# Patient Record
Sex: Female | Born: 1970 | Race: White | Hispanic: No | Marital: Married | Smoking: Former smoker
Health system: Southern US, Community
[De-identification: ages and names within clinical notes are randomized; demographics above are authoritative.]

## PROBLEM LIST (undated history)

## (undated) DIAGNOSIS — C801 Malignant (primary) neoplasm, unspecified: Secondary | ICD-10-CM

## (undated) DIAGNOSIS — Z923 Personal history of irradiation: Secondary | ICD-10-CM

## (undated) DIAGNOSIS — Z9221 Personal history of antineoplastic chemotherapy: Secondary | ICD-10-CM

## (undated) HISTORY — PX: PORTACATH PLACEMENT: SHX2246

## (undated) HISTORY — PX: WISDOM TOOTH EXTRACTION: SHX21

---
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
## 1998-08-25 HISTORY — PX: BUNIONECTOMY: SHX129

## 1999-09-26 ENCOUNTER — Ambulatory Visit (HOSPITAL_BASED_OUTPATIENT_CLINIC_OR_DEPARTMENT_OTHER): Admission: RE | Admit: 1999-09-26 | Discharge: 1999-09-26 | Payer: Self-pay | Admitting: Internal Medicine

## 2017-02-15 ENCOUNTER — Encounter (HOSPITAL_COMMUNITY): Payer: Self-pay | Admitting: Emergency Medicine

## 2017-02-15 ENCOUNTER — Inpatient Hospital Stay (HOSPITAL_COMMUNITY)
Admission: EM | Admit: 2017-02-15 | Discharge: 2017-02-17 | DRG: 494 | Disposition: A | Discharge status: Home / Self Care | Payer: 59 | Attending: Orthopaedic Surgery | Admitting: Orthopaedic Surgery

## 2017-02-15 ENCOUNTER — Emergency Department (HOSPITAL_COMMUNITY): Payer: Self-pay

## 2017-02-15 DIAGNOSIS — S82872A Displaced pilon fracture of left tibia, initial encounter for closed fracture: Principal | ICD-10-CM | POA: Diagnosis present

## 2017-02-15 DIAGNOSIS — Z419 Encounter for procedure for purposes other than remedying health state, unspecified: Secondary | ICD-10-CM

## 2017-02-15 DIAGNOSIS — T1490XA Injury, unspecified, initial encounter: Secondary | ICD-10-CM

## 2017-02-15 DIAGNOSIS — S82452A Displaced comminuted fracture of shaft of left fibula, initial encounter for closed fracture: Secondary | ICD-10-CM | POA: Diagnosis present

## 2017-02-15 DIAGNOSIS — F17210 Nicotine dependence, cigarettes, uncomplicated: Secondary | ICD-10-CM | POA: Diagnosis present

## 2017-02-15 DIAGNOSIS — S82402A Unspecified fracture of shaft of left fibula, initial encounter for closed fracture: Secondary | ICD-10-CM

## 2017-02-15 DIAGNOSIS — S82202A Unspecified fracture of shaft of left tibia, initial encounter for closed fracture: Secondary | ICD-10-CM

## 2017-02-15 DIAGNOSIS — Y9352 Activity, horseback riding: Secondary | ICD-10-CM

## 2017-02-15 MED ORDER — TETANUS-DIPHTH-ACELL PERTUSSIS 5-2.5-18.5 LF-MCG/0.5 IM SUSP: 0.5000 mL | Freq: Once | INTRAMUSCULAR | Status: DC

## 2017-02-15 MED ORDER — FENTANYL CITRATE (PF) 100 MCG/2ML IJ SOLN
50.0000 ug | Freq: Once | INTRAMUSCULAR | Status: AC
  Administered 2017-02-15: 50 ug via INTRAVENOUS
  Filled 2017-02-15: qty 2

## 2017-02-15 MED ORDER — CEFAZOLIN SODIUM-DEXTROSE 1-4 GM/50ML-% IV SOLN
1.0000 g | Freq: Once | INTRAVENOUS | Status: AC
  Administered 2017-02-16: 1 g via INTRAVENOUS
  Filled 2017-02-15: qty 50

## 2017-02-15 MED ORDER — HYDROMORPHONE HCL 1 MG/ML IJ SOLN
1.0000 mg | Freq: Once | INTRAMUSCULAR | Status: AC
  Administered 2017-02-16: 1 mg via INTRAVENOUS
  Filled 2017-02-15: qty 1

## 2017-02-15 NOTE — ED Provider Notes (Signed)
Patient seen/examined in the Emergency Department in conjunction with Midlevel Provider [facility] Patient reports left ankle pain s/p fall from horse.  No other injury reported Exam : awake/alert, distal pulses intact, left ankle deformity noted.  Small abrasions noted medially.   No focal neck tenderness No chest or abdominal tenderness Plan: consult ortho, will likely need admit for complex ankle fracture     Ripley Fraise, MD 02/15/17 2340

## 2017-02-15 NOTE — ED Triage Notes (Signed)
Pt fell off horse and landed on her L ankle, obvious deformity on L ankle. CMS intact, pt able to move toes. Pulses marked.

## 2017-02-15 NOTE — ED Notes (Signed)
Delay in lab draw,  Pt  Not in room

## 2017-02-15 NOTE — ED Provider Notes (Signed)
[place] DEPT Provider Note   CSN: [id number] Arrival date & time: 02/15/17  2222     History   Chief Complaint Chief Complaint  Patient presents with  . Ankle Pain    HPI Angel Hale is a 46 y.o. female.  Patient presents to the emergency department with chief complaint of left ankle injury. She states that she was riding her horse, which is not completely broken, and began bucking and through her to the ground. She landed on her left ankle while twisting it. She complains of severe pain in her left ankle. She denies any numbness or weakness. She has received fentanyl by EMS. She is unable to ambulate. She denies any other injuries. She did not hit her head or pass out. She reports allergy to penicillin, but nothing else. She denies any medical problems.   The history is provided by the patient. No language interpreter was used.    History reviewed. No pertinent past medical history.  There are no active problems to display for this patient.   History reviewed. No pertinent surgical history.  OB History    No data available       Home Medications    Prior to Admission medications   Not on File    Family History No family history on file.  Social History Social History  Substance Use Topics  . Smoking status: Current Every Day Smoker    Packs/day: 1.00    Types: Cigarettes  . Smokeless tobacco: Never Used  . Alcohol use Yes     Allergies   Patient has no known allergies.   Review of Systems Review of Systems  All other systems reviewed and are negative.    Physical Exam Updated Vital Signs BP (!) 149/34 (BP Location: Right Arm)   Pulse 65   Temp 98.3 F (36.8 C) (Oral)   Resp 16   Ht 5\' 5"  (1.651 m)   Wt 49.9 kg (110 lb)   LMP 01/18/2017 (Within Days)   SpO2 98%   BMI 18.30 kg/m   Physical Exam Nursing note and vitals reviewed.  Constitutional: Pt appears well-developed and well-nourished. No distress.  HENT:  Head:  Normocephalic and atraumatic.  Eyes: Conjunctivae are normal.  Neck: Normal range of motion.  Cardiovascular: Normal rate, regular rhythm. Intact distal pulses.   Capillary refill < 3 sec.  Pulmonary/Chest: Effort normal and breath sounds normal.  Musculoskeletal:  LLE Pt exhibits tenderness to palpation diffusely about the left ankle with moderate swelling and a contusion on the medial aspect.   ROM: Able to wiggle toes, but not move ankle without significant pain  Strength: deferred  Neurological: Pt  is alert. Coordination normal.  Sensation: 5/5 Skin: Skin is warm and dry. Pt is not diaphoretic.  Minor abrasion to medial ankle, doesn't appear consistent with puncture Psychiatric: Pt has a normal mood and affect.     ED Treatments / Results  Labs (all labs ordered are listed, but only abnormal results are displayed) Labs Reviewed - No data to display  EKG  EKG Interpretation None       Radiology Dg Ankle 2 Views Left  Result Date: 02/15/2017 CLINICAL DATA:  [place] off of a horse and injured the left ankle EXAM: LEFT ANKLE - 2 VIEW COMPARISON:  None. FINDINGS: Comminuted fracture involving the distal diaphysis of the fibula. There is moderate lateral and posterior angulation of the distal fracture fragment. Superiorly displaced 9 mm fracture fragment at the fibular fracture site with approximately  15 mm of overriding. Severely comminuted intra-articular fracture of the distal tibia with distraction of fracture fragments and lateral and posterior angulation of the distal fracture fragments. Widened and disrupted superolateral mortise. IMPRESSION: 1. Comminuted, angulated and overriding distal fibular fracture 2. Severely comminuted, angulated intra-articular fracture of the distal tibia. Electronically Signed   By: Donavan Foil M.D.   On: 02/15/2017 22:48    Procedures Procedures (including critical care time)  Medications Ordered in ED Medications  fentaNYL (SUBLIMAZE)  injection 50 mcg (50 mcg Intravenous Given 02/15/17 2303)     Initial Impression / Assessment and Plan / ED Course  I have reviewed the triage vital signs and the nursing notes.  Pertinent labs & imaging results that were available during my care of the patient were reviewed by me and considered in my medical decision making (see chart for details).     Patient with significant ankle fracture.  Doesn't appear to be open, but there is a wound medially.  Plan to discuss with attending and orthopedics.  Patient seen by and discussed with Dr. Christy Gentles, who agrees with plan for consultation with orthopedics.    Appreciate Dr. Ninfa Linden for taking patient to the OR tonight.  Final Clinical Impressions(s) / ED Diagnoses   Final diagnoses:  Closed fracture of left tibia and fibula, initial encounter  Fall from horse, initial encounter    New Prescriptions New Prescriptions   No medications on file     Montine Circle, Hershal Coria 02/16/17 0335    Ripley Fraise, MD 02/16/17 [phone]

## 2017-02-15 NOTE — ED Notes (Signed)
ED Provider at bedside. 

## 2017-02-15 NOTE — ED Notes (Signed)
To XRAY at this time 

## 2017-02-16 ENCOUNTER — Inpatient Hospital Stay (HOSPITAL_COMMUNITY): Payer: Self-pay

## 2017-02-16 ENCOUNTER — Encounter (HOSPITAL_COMMUNITY)
Admission: EM | Disposition: A | Discharge status: Home / Self Care | Payer: Self-pay | Source: Home / Self Care | Attending: Orthopaedic Surgery

## 2017-02-16 ENCOUNTER — Encounter (HOSPITAL_COMMUNITY): Payer: Self-pay | Admitting: Certified Registered"

## 2017-02-16 ENCOUNTER — Emergency Department (HOSPITAL_COMMUNITY): Payer: Self-pay | Admitting: Certified Registered"

## 2017-02-16 DIAGNOSIS — S82872A Displaced pilon fracture of left tibia, initial encounter for closed fracture: Secondary | ICD-10-CM | POA: Diagnosis not present

## 2017-02-16 HISTORY — PX: EXTERNAL FIXATION LEG: SHX1549

## 2017-02-16 LAB — CBC WITH DIFFERENTIAL/PLATELET
BASOS ABS: 0 10*3/uL (ref 0.0–0.1)
BASOS PCT: 0 %
EOS ABS: 0 10*3/uL (ref 0.0–0.7)
EOS PCT: 0 %
HEMATOCRIT: 38.6 % (ref 36.0–46.0)
Hemoglobin: 13.1 g/dL (ref 12.0–15.0)
Lymphocytes Relative: 6 %
Lymphs Abs: 1.1 10*3/uL (ref 0.7–4.0)
MCH: 32.9 pg (ref 26.0–34.0)
MCHC: 33.9 g/dL (ref 30.0–36.0)
MCV: 97 fL (ref 78.0–100.0)
MONO ABS: 0.6 10*3/uL (ref 0.1–1.0)
Monocytes Relative: 4 %
NEUTROS ABS: 15.5 10*3/uL — AB (ref 1.7–7.7)
Neutrophils Relative %: 90 %
PLATELETS: 205 10*3/uL (ref 150–400)
RBC: 3.98 MIL/uL (ref 3.87–5.11)
RDW: 13.5 % (ref 11.5–15.5)
WBC: 17.2 10*3/uL — ABNORMAL HIGH (ref 4.0–10.5)

## 2017-02-16 LAB — BASIC METABOLIC PANEL
ANION GAP: 9 (ref 5–15)
BUN: 13 mg/dL (ref 6–20)
CALCIUM: 8.6 mg/dL — AB (ref 8.9–10.3)
CO2: 21 mmol/L — ABNORMAL LOW (ref 22–32)
CREATININE: 0.71 mg/dL (ref 0.44–1.00)
Chloride: 108 mmol/L (ref 101–111)
Glucose, Bld: 121 mg/dL — ABNORMAL HIGH (ref 65–99)
Potassium: 3.5 mmol/L (ref 3.5–5.1)
SODIUM: 138 mmol/L (ref 135–145)

## 2017-02-16 LAB — I-STAT BETA HCG BLOOD, ED (MC, WL, AP ONLY)

## 2017-02-16 SURGERY — EXTERNAL FIXATION, LOWER EXTREMITY
Anesthesia: General | Site: Ankle | Laterality: Left

## 2017-02-16 MED ORDER — LIDOCAINE 2% (20 MG/ML) 5 ML SYRINGE
INTRAMUSCULAR | Status: AC
  Filled 2017-02-16: qty 5

## 2017-02-16 MED ORDER — PROPOFOL 10 MG/ML IV BOLUS
INTRAVENOUS | Status: AC
  Filled 2017-02-16: qty 20

## 2017-02-16 MED ORDER — SUCCINYLCHOLINE CHLORIDE 200 MG/10ML IV SOSY
PREFILLED_SYRINGE | INTRAVENOUS | Status: DC | PRN
  Administered 2017-02-16: 100 mg via INTRAVENOUS

## 2017-02-16 MED ORDER — METOCLOPRAMIDE HCL 5 MG/ML IJ SOLN: 5.0000 mg | Freq: Three times a day (TID) | INTRAMUSCULAR | Status: DC | PRN

## 2017-02-16 MED ORDER — ACETAMINOPHEN 650 MG RE SUPP: 650.0000 mg | Freq: Four times a day (QID) | RECTAL | Status: DC | PRN

## 2017-02-16 MED ORDER — ONDANSETRON HCL 4 MG/2ML IJ SOLN
4.0000 mg | Freq: Four times a day (QID) | INTRAMUSCULAR | Status: DC | PRN
  Administered 2017-02-16: 4 mg via INTRAVENOUS
  Filled 2017-02-16: qty 2

## 2017-02-16 MED ORDER — PROPOFOL 10 MG/ML IV BOLUS
INTRAVENOUS | Status: DC | PRN
  Administered 2017-02-16: 50 mg via INTRAVENOUS
  Administered 2017-02-16: 150 mg via INTRAVENOUS

## 2017-02-16 MED ORDER — CEFAZOLIN SODIUM-DEXTROSE 1-4 GM/50ML-% IV SOLN
1.0000 g | Freq: Four times a day (QID) | INTRAVENOUS | Status: AC
  Administered 2017-02-16 (×3): 1 g via INTRAVENOUS
  Filled 2017-02-16 (×3): qty 50

## 2017-02-16 MED ORDER — SODIUM CHLORIDE 0.9 % IJ SOLN
INTRAMUSCULAR | Status: AC
  Filled 2017-02-16: qty 10

## 2017-02-16 MED ORDER — ONDANSETRON HCL 4 MG PO TABS: 4.0000 mg | ORAL_TABLET | Freq: Four times a day (QID) | ORAL | Status: DC | PRN

## 2017-02-16 MED ORDER — MIDAZOLAM HCL 5 MG/5ML IJ SOLN
INTRAMUSCULAR | Status: DC | PRN
  Administered 2017-02-16: 2 mg via INTRAVENOUS

## 2017-02-16 MED ORDER — MEPERIDINE HCL 25 MG/ML IJ SOLN
6.2500 mg | INTRAMUSCULAR | Status: DC | PRN
  Administered 2017-02-16: 12.5 mg via INTRAVENOUS

## 2017-02-16 MED ORDER — METOCLOPRAMIDE HCL 5 MG PO TABS: 5.0000 mg | ORAL_TABLET | Freq: Three times a day (TID) | ORAL | Status: DC | PRN

## 2017-02-16 MED ORDER — ACETAMINOPHEN 325 MG PO TABS
650.0000 mg | ORAL_TABLET | Freq: Four times a day (QID) | ORAL | Status: DC | PRN
  Filled 2017-02-16: qty 2

## 2017-02-16 MED ORDER — PROMETHAZINE HCL 25 MG/ML IJ SOLN: 6.2500 mg | INTRAMUSCULAR | Status: DC | PRN

## 2017-02-16 MED ORDER — DEXAMETHASONE SODIUM PHOSPHATE 10 MG/ML IJ SOLN
INTRAMUSCULAR | Status: AC
  Filled 2017-02-16: qty 1

## 2017-02-16 MED ORDER — METHOCARBAMOL 500 MG PO TABS
500.0000 mg | ORAL_TABLET | Freq: Four times a day (QID) | ORAL | Status: DC | PRN
  Administered 2017-02-16 – 2017-02-17 (×3): 500 mg via ORAL
  Filled 2017-02-16 (×3): qty 1

## 2017-02-16 MED ORDER — DIPHENHYDRAMINE HCL 12.5 MG/5ML PO ELIX: 12.5000 mg | ORAL_SOLUTION | ORAL | Status: DC | PRN

## 2017-02-16 MED ORDER — SUCCINYLCHOLINE CHLORIDE 200 MG/10ML IV SOSY
PREFILLED_SYRINGE | INTRAVENOUS | Status: AC
  Filled 2017-02-16: qty 10

## 2017-02-16 MED ORDER — CEFAZOLIN SODIUM 1 G IJ SOLR
INTRAMUSCULAR | Status: DC | PRN
  Administered 2017-02-16: 1 g via INTRAMUSCULAR

## 2017-02-16 MED ORDER — OXYCODONE HCL 5 MG PO TABS
5.0000 mg | ORAL_TABLET | ORAL | Status: DC | PRN
  Administered 2017-02-16: 5 mg via ORAL
  Administered 2017-02-16 (×4): 10 mg via ORAL
  Administered 2017-02-16: 5 mg via ORAL
  Administered 2017-02-17 (×3): 10 mg via ORAL
  Filled 2017-02-16 (×8): qty 2
  Filled 2017-02-16: qty 1

## 2017-02-16 MED ORDER — LACTATED RINGERS IV SOLN
INTRAVENOUS | Status: DC | PRN
  Administered 2017-02-16 (×2): via INTRAVENOUS

## 2017-02-16 MED ORDER — HYDROMORPHONE HCL 1 MG/ML IJ SOLN: 1.0000 mg | INTRAMUSCULAR | Status: DC | PRN

## 2017-02-16 MED ORDER — SUFENTANIL CITRATE 50 MCG/ML IV SOLN
INTRAVENOUS | Status: DC | PRN
  Administered 2017-02-16 (×2): 10 ug via INTRAVENOUS

## 2017-02-16 MED ORDER — ONDANSETRON HCL 4 MG/2ML IJ SOLN
INTRAMUSCULAR | Status: AC
  Filled 2017-02-16: qty 2

## 2017-02-16 MED ORDER — HYDROMORPHONE HCL 1 MG/ML IJ SOLN
INTRAMUSCULAR | Status: AC
  Filled 2017-02-16: qty 0.5

## 2017-02-16 MED ORDER — HYDROMORPHONE HCL 1 MG/ML IJ SOLN
0.2500 mg | INTRAMUSCULAR | Status: DC | PRN
  Administered 2017-02-16 (×2): 0.25 mg via INTRAVENOUS

## 2017-02-16 MED ORDER — MIDAZOLAM HCL 2 MG/2ML IJ SOLN
INTRAMUSCULAR | Status: AC
  Filled 2017-02-16: qty 2

## 2017-02-16 MED ORDER — ONDANSETRON HCL 4 MG/2ML IJ SOLN
INTRAMUSCULAR | Status: DC | PRN
  Administered 2017-02-16: 4 mg via INTRAVENOUS

## 2017-02-16 MED ORDER — SUFENTANIL CITRATE 50 MCG/ML IV SOLN
INTRAVENOUS | Status: AC
  Filled 2017-02-16: qty 1

## 2017-02-16 MED ORDER — ASPIRIN 325 MG PO TABS
325.0000 mg | ORAL_TABLET | Freq: Two times a day (BID) | ORAL | Status: DC
  Administered 2017-02-16 – 2017-02-17 (×3): 325 mg via ORAL
  Filled 2017-02-16 (×3): qty 1

## 2017-02-16 MED ORDER — CEFAZOLIN SODIUM 1 G IJ SOLR
INTRAMUSCULAR | Status: AC
  Filled 2017-02-16: qty 10

## 2017-02-16 MED ORDER — MEPERIDINE HCL 25 MG/ML IJ SOLN
INTRAMUSCULAR | Status: AC
  Filled 2017-02-16: qty 1

## 2017-02-16 MED ORDER — SODIUM CHLORIDE 0.9 % IV SOLN
INTRAVENOUS | Status: DC
  Administered 2017-02-16: 75 mL/h via INTRAVENOUS
  Administered 2017-02-16: 15:00:00 via INTRAVENOUS

## 2017-02-16 MED ORDER — DEXTROSE 5 % IV SOLN: 500.0000 mg | Freq: Four times a day (QID) | INTRAVENOUS | Status: DC | PRN

## 2017-02-16 MED ORDER — LACTATED RINGERS IV SOLN: INTRAVENOUS | Status: DC

## 2017-02-16 MED ORDER — DEXAMETHASONE SODIUM PHOSPHATE 10 MG/ML IJ SOLN
INTRAMUSCULAR | Status: DC | PRN
  Administered 2017-02-16: 10 mg via INTRAVENOUS

## 2017-02-16 MED ORDER — LIDOCAINE 2% (20 MG/ML) 5 ML SYRINGE
INTRAMUSCULAR | Status: DC | PRN
  Administered 2017-02-16: 100 mg via INTRAVENOUS

## 2017-02-16 SURGICAL SUPPLY — 49 items
BNDG ADH 5X4 AIR PERM ELC (GAUZE/BANDAGES/DRESSINGS) ×1
BNDG COHESIVE 4X5 WHT NS (GAUZE/BANDAGES/DRESSINGS) ×2 IMPLANT
BNDG GAUZE ELAST 4 BULKY (GAUZE/BANDAGES/DRESSINGS) ×2 IMPLANT
COVER SURGICAL LIGHT HANDLE (MISCELLANEOUS) ×3 IMPLANT
DRAPE C-ARM 42X72 X-RAY (DRAPES) ×3 IMPLANT
DRAPE C-ARMOR (DRAPES) ×2 IMPLANT
DRAPE ORTHO SPLIT 77X108 STRL (DRAPES) ×6
DRAPE SURG ORHT 6 SPLT 77X108 (DRAPES) ×2 IMPLANT
DRAPE U-SHAPE 47X51 STRL (DRAPES) ×5 IMPLANT
DURAPREP 26ML APPLICATOR (WOUND CARE) ×3 IMPLANT
ELECT REM PT RETURN 9FT ADLT (ELECTROSURGICAL) ×3
ELECTRODE REM PT RTRN 9FT ADLT (ELECTROSURGICAL) IMPLANT
GAUZE SPONGE 4X4 12PLY STRL (GAUZE/BANDAGES/DRESSINGS) ×3 IMPLANT
GAUZE XEROFORM 5X9 LF (GAUZE/BANDAGES/DRESSINGS) ×3 IMPLANT
GLOVE BIO SURGEON STRL SZ8 (GLOVE) ×5 IMPLANT
GLOVE BIOGEL PI IND STRL 8 (GLOVE) IMPLANT
GLOVE BIOGEL PI IND STRL 8.5 (GLOVE) IMPLANT
GLOVE BIOGEL PI INDICATOR 8 (GLOVE) ×4
GLOVE BIOGEL PI INDICATOR 8.5 (GLOVE) ×2
GLOVE ORTHO TXT STRL SZ7.5 (GLOVE) ×7 IMPLANT
GLOVE SURG SS PI 7.0 STRL IVOR (GLOVE) ×2 IMPLANT
GOWN STRL REUS W/ TWL LRG LVL3 (GOWN DISPOSABLE) ×1 IMPLANT
GOWN STRL REUS W/ TWL XL LVL3 (GOWN DISPOSABLE) ×4 IMPLANT
GOWN STRL REUS W/TWL LRG LVL3 (GOWN DISPOSABLE) ×3
GOWN STRL REUS W/TWL XL LVL3 (GOWN DISPOSABLE) ×6
KIT BASIN OR (CUSTOM PROCEDURE TRAY) ×3 IMPLANT
KIT ROOM TURNOVER OR (KITS) ×3 IMPLANT
MANIFOLD NEPTUNE II (INSTRUMENTS) ×3 IMPLANT
NS IRRIG 1000ML POUR BTL (IV SOLUTION) ×3 IMPLANT
PACK ORTHO EXTREMITY (CUSTOM PROCEDURE TRAY) ×3 IMPLANT
PAD ARMBOARD 7.5X6 YLW CONV (MISCELLANEOUS) ×6 IMPLANT
PADDING CAST COTTON 6X4 STRL (CAST SUPPLIES) ×3 IMPLANT
PIN APEX 5X150 (EXFIX) ×4 IMPLANT
PIN CLAMP 5H 30DEG POST (EXFIX) ×2 IMPLANT
PIN TO ROD COUPLING INVERT (EXFIX) ×4 IMPLANT
PIN TRANSFIXING EXFIX (EXFIX) ×2 IMPLANT
ROD CARBON (EXFIX) ×3
ROD CNCT 400X11XNS LF HFMN (EXFIX) IMPLANT
ROD HOFFMANN3 CONNECT 11X350 (EXFIX) ×2 IMPLANT
ROD SEMI CIRCULAR EXFIX (EXFIX) ×2 IMPLANT
ROD TO ROD COUPLING EXFIX (EXFIX) ×8 IMPLANT
SPONGE LAP 18X18 X RAY DECT (DISPOSABLE) ×3 IMPLANT
SUT ETHILON 2 0 FS 18 (SUTURE) ×2 IMPLANT
SUT VIC AB 2-0 CT1 27 (SUTURE)
SUT VIC AB 2-0 CT1 TAPERPNT 27 (SUTURE) IMPLANT
TOWEL OR 17X24 6PK STRL BLUE (TOWEL DISPOSABLE) ×3 IMPLANT
TOWEL OR 17X26 10 PK STRL BLUE (TOWEL DISPOSABLE) ×3 IMPLANT
UNDERPAD 30X30 (UNDERPADS AND DIAPERS) ×3 IMPLANT
WATER STERILE IRR 1000ML POUR (IV SOLUTION) ×3 IMPLANT

## 2017-02-16 NOTE — Anesthesia Postprocedure Evaluation (Signed)
Anesthesia Post Note  Patient: Makari Lukic  Procedure(s) Performed: Procedure(s) (LRB): EXTERNAL FIXATION LEG (Left)     Patient location during evaluation: PACU Anesthesia Type: General Level of consciousness: awake and alert Pain management: pain level controlled Vital Signs Assessment: post-procedure vital signs reviewed and stable Respiratory status: spontaneous breathing, nonlabored ventilation, respiratory function stable and patient connected to nasal cannula oxygen Cardiovascular status: blood pressure returned to baseline and stable Postop Assessment: no signs of nausea or vomiting Anesthetic complications: no    Last Vitals:  Vitals:   02/16/17 0300 02/16/17 0316  BP: 111/68 120/68  Pulse: 83 (!) 58  Resp: 19 18  Temp:  37.1 C    Last Pain:  Vitals:   02/16/17 0427  TempSrc:   PainSc: 0-No pain                 Effie Berkshire

## 2017-02-16 NOTE — ED Notes (Signed)
Refusing hydromorphone at this time, asked patient to let nursing staff know as soon as she wants pain meds.

## 2017-02-16 NOTE — Anesthesia Preprocedure Evaluation (Signed)
Anesthesia Evaluation  Patient identified by MRN, date of birth, ID band Patient awake    Reviewed: Allergy & Precautions, NPO status , Patient's Chart, lab work & pertinent test results  Airway Mallampati: I  TM Distance: >3 FB Neck ROM: Full    Dental  (+) Teeth Intact, Dental Advisory Given   Pulmonary Current Smoker,    breath sounds clear to auscultation       Cardiovascular negative cardio ROS   Rhythm:Regular Rate:Normal     Neuro/Psych negative neurological ROS  negative psych ROS   GI/Hepatic negative GI ROS, Neg liver ROS,   Endo/Other  negative endocrine ROS  Renal/GU negative Renal ROS  negative genitourinary   Musculoskeletal negative musculoskeletal ROS (+)   Abdominal   Peds negative pediatric ROS (+)  Hematology negative hematology ROS (+)   Anesthesia Other Findings   Reproductive/Obstetrics negative OB ROS                             Anesthesia Physical Anesthesia Plan  ASA: II and emergent  Anesthesia Plan: General   Post-op Pain Management:    Induction: Intravenous, Rapid sequence and Cricoid pressure planned  PONV Risk Score and Plan: 3 and Ondansetron, Dexamethasone, Propofol and Midazolam  Airway Management Planned: Oral ETT  Additional Equipment:   Intra-op Plan:   Post-operative Plan: Extubation in OR  Informed Consent: I have reviewed the patients History and Physical, chart, labs and discussed the procedure including the risks, benefits and alternatives for the proposed anesthesia with the patient or authorized representative who has indicated his/her understanding and acceptance.   Dental advisory given  Plan Discussed with: CRNA  Anesthesia Plan Comments:         Anesthesia Quick Evaluation

## 2017-02-16 NOTE — Evaluation (Signed)
Occupational Therapy Evaluation Patient Details Name: Angel Hale MRN: [id number] DOB: 1970/12/09 Today's Date: 02/16/2017    History of Present Illness  46 year old female who sustained a mechanical fall off of her horse.  X-rays showing left ankle pilon fracture with significant displacement and angulation. Underwent external fixator on 02/16/17. Definitive surgery will not be for at least a week due to the need to left soft-tissue swelling subside. PHM including    Clinical Impression   PTA, pt was living with her husband [place] and was very active and independent. Pt very motivated and appreciative of therapy. Currently, pt performing LB ADLs with Min A and functional mobility with Min guard A and RW. Educated pt on LB dressing and sponge bathing at home till follow up surgery; pt verbalized understanding. Recommend dc home once medically stable per physician. All current acute OT needs met and will sign off.     Follow Up Recommendations  No OT follow up;Supervision - Intermittent    Equipment Recommendations  None recommended by OT    Recommendations for Other Services PT consult     Precautions / Restrictions Precautions Precautions: Fall Restrictions Weight Bearing Restrictions: Yes LLE Weight Bearing: Non weight bearing      Mobility Bed Mobility Overal bed mobility: Needs Assistance Bed Mobility: Supine to Sit     Supine to sit: Supervision     General bed mobility comments: Pt able to transition to EOB without physical A  Transfers Overall transfer level: Needs assistance Equipment used: Rolling walker (2 wheeled) Transfers: Sit to/from Stand Sit to Stand: Min guard         General transfer comment: Min guard for safety    Balance Overall balance assessment: Needs assistance Sitting-balance support: No upper extremity supported;Feet supported Sitting balance-Leahy Scale: Good     Standing balance support: No upper extremity supported;During  functional activity Standing balance-Leahy Scale: Fair Standing balance comment: Pt able to wash her hands without UE support while maintaining WB precautions                           ADL either performed or assessed with clinical judgement   ADL Overall ADL's : Needs assistance/impaired Eating/Feeding: Set up;Sitting   Grooming: Wash/dry hands;Min guard;Standing Grooming Details (indicate cue type and reason): Pt performed hand hygiene at sink with Min guard for safety Upper Body Bathing: Set up;Sitting   Lower Body Bathing: Min guard;Sit to/from stand   Upper Body Dressing : Set up;Sitting   Lower Body Dressing: Sit to/from stand;Minimal assistance Lower Body Dressing Details (indicate cue type and reason): Discussed LB dressing technique. Pt states that her husband will help her at home.  Toilet Transfer: Ambulation;RW;Regular Toilet;Min Psychiatric nurse Details (indicate cue type and reason): Min guard for safety Toileting- Clothing Manipulation and Hygiene: Set up;Sitting/lateral lean       Functional mobility during ADLs: Min guard;Rolling walker General ADL Comments: Pt dmeonstrating good control and motivation to perform ADLs and fucntional mobility. Pt adhering to WB precautions throughout session. Discussed LB dressing techniques and sponge bathing till external fixator is removed. Pt very appreciative of therapy     Vision         Perception     Praxis      Pertinent Vitals/Pain Pain Assessment: 0-10 Pain Score: 5  Pain Location: L foot and leg Pain Descriptors / Indicators: Discomfort;Grimacing;Throbbing Pain Intervention(s): Monitored during session;Repositioned;Ice applied     Hand Dominance Right  Extremity/Trunk Assessment Upper Extremity Assessment Upper Extremity Assessment: Overall WFL for tasks assessed   Lower Extremity Assessment Lower Extremity Assessment: Defer to PT evaluation;LLE deficits/detail LLE Deficits / Details:   left ankle pilon fracture with external fixator LLE: Unable to fully assess due to immobilization LLE Sensation:  (Intact to light touch) LLE Coordination: decreased fine motor   Cervical / Trunk Assessment Cervical / Trunk Assessment: Normal   Communication Communication Communication: No difficulties   Cognition Arousal/Alertness: Awake/alert Behavior During Therapy: WFL for tasks assessed/performed Overall Cognitive Status: Within Functional Limits for tasks assessed                                     General Comments  SpO2 ranging from 100-95% on roomair    Exercises     Shoulder Instructions      Home Living Family/patient expects to be discharged to:: Private residence Living Arrangements: Spouse/significant other Available Help at Discharge: Family;Available PRN/intermittently Type of Home: House Home Access: Stairs to enter [employer] of Steps: 2 Entrance Stairs-Rails: None Home Layout: Two level;Bed/bath upstairs Alternate Level Stairs-Number of Steps: 13 Alternate Level Stairs-Rails: Left Bathroom Shower/Tub: Walk-in shower;Tub/shower unit (Usually uses tub)   Bathroom Toilet: Standard Bathroom Accessibility: No   Home Equipment: Shower seat - built in   Additional Comments: 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 Very active      Prior Functioning/Environment Level of Independence: Independent        Comments: ADLs, IADLs, driving; has horses         OT Problem List: Impaired balance (sitting and/or standing);Decreased knowledge of use of DME or AE;Decreased knowledge of precautions;Pain      OT Treatment/Interventions:      OT Goals(Current goals can be found in the care plan section) Acute Rehab OT Goals Patient Stated Goal: Return to PLOF OT Goal Formulation: With patient Time For Goal Achievement: 03/02/17 Potential to Achieve Goals: Good  OT Frequency:     Barriers to D/C:            Co-evaluation               AM-PAC PT "6 Clicks" Daily Activity     Outcome Measure Help from another person eating meals?: None Help from another person taking care of personal grooming?: A Little Help from another person toileting, which includes using toliet, bedpan, or urinal?: A Little Help from another person bathing (including washing, rinsing, drying)?: A Little Help from another person to put on and taking off regular upper body clothing?: None Help from another person to put on and taking off regular lower body clothing?: A Little 6 Click Score: 20   End of Session Equipment Utilized During Treatment: Gait belt;Rolling walker Nurse Communication: Mobility status;Precautions;Weight bearing status;Other (comment) (SpO2)  Activity Tolerance: Patient tolerated treatment well Patient left: in chair;with call bell/phone within reach  OT Visit Diagnosis: Other abnormalities of gait and mobility (R26.89);Pain Pain - Right/Left: Left Pain - part of body: Leg                Time: 0626-9485 OT Time Calculation (min): 48 min Charges:  OT General Charges $OT Visit: 1 Procedure OT Evaluation $OT Eval Low Complexity: 1 Procedure OT Treatments $Self Care/Home Management : 23-37 mins G-Codes:     [place], OTR/L Acute Rehab Pager: [phone] Office: [place] 02/16/2017, 10:10 AM

## 2017-02-16 NOTE — Transfer of Care (Signed)
Immediate Anesthesia Transfer of Care Note  Patient: Angel Hale  Procedure(s) Performed: Procedure(s): EXTERNAL FIXATION LEG (Left)  Patient Location: PACU  Anesthesia Type:General  Level of Consciousness: oriented, sedated, drowsy, patient cooperative and responds to stimulation  Airway & Oxygen Therapy: Patient Spontanous Breathing and Patient connected to nasal cannula oxygen  Post-op Assessment: Report given to RN, Post -op Vital signs reviewed and stable and Patient moving all extremities X 4  Post vital signs: Reviewed and stable  Last Vitals:  Vitals:   02/16/17 0015 02/16/17 0224  BP: 115/63 121/68  Pulse: 75 78  Resp: 19 15  Temp:  36.4 C    Last Pain:  Vitals:   02/16/17 0038  TempSrc:   PainSc: 7          Complications: No apparent anesthesia complications

## 2017-02-16 NOTE — H&P (Signed)
Angel Hale is an 46 y.o. female.   Chief Complaint:  Left ankle pain with known fracturevb HPI: The patient is a pleasant 46 year old female who sustained a mechanical fall off of her horse. She is brought to the emergency room at [facility] and x-rays are obtained showing a left ankle pilon fracture with significant displacement and angulation. She reports severe left ankle pain and denies other injuries. She is otherwise a healthy individual. Family is at the bedside. She denies any numbness in her left foot.  History reviewed. No pertinent past medical history.  History reviewed. No pertinent surgical history.  No family history on file. Social History:  reports that she has been smoking Cigarettes.  She has been smoking about 1.00 pack per day. She has never used smokeless tobacco. She reports that she drinks alcohol. Her drug history is not on file.  Allergies: No Known Allergies   (Not in a hospital admission)  Results for orders placed or performed during the hospital encounter of 02/15/17 (from the past 48 hour(s))  I-Stat Beta hCG blood, ED ([facility], [facility], [facility] only)     Status: None   Collection Time: 02/16/17 12:14 AM  Result Value Ref Range   I-stat hCG, quantitative <5.0 <5 mIU/mL   Comment 3            Comment:   GEST. AGE      CONC.  (mIU/mL)   <=1 WEEK        5 - 50     2 WEEKS       50 - 500     3 WEEKS       100 - 10,000     4 WEEKS     1,000 - 30,000        FEMALE AND NON-PREGNANT FEMALE:     LESS THAN 5 mIU/mL    Dg Chest 2 View  Result Date: 02/15/2017 CLINICAL DATA:  Preop left ankle fracture EXAM: CHEST  2 VIEW COMPARISON:  None. FINDINGS: The lungs are clear. The pulmonary vasculature is normal. Heart size is normal. Hilar and mediastinal contours are unremarkable. There is no pleural effusion. IMPRESSION: No active cardiopulmonary disease. Electronically Signed   By: Andreas Newport M.D.   On: 02/15/2017 23:58   Dg Ankle 2 Views Left  Result Date:  02/15/2017 CLINICAL DATA:  [place] off of a horse and injured the left ankle EXAM: LEFT ANKLE - 2 VIEW COMPARISON:  None. FINDINGS: Comminuted fracture involving the distal diaphysis of the fibula. There is moderate lateral and posterior angulation of the distal fracture fragment. Superiorly displaced 9 mm fracture fragment at the fibular fracture site with approximately 15 mm of overriding. Severely comminuted intra-articular fracture of the distal tibia with distraction of fracture fragments and lateral and posterior angulation of the distal fracture fragments. Widened and disrupted superolateral mortise. IMPRESSION: 1. Comminuted, angulated and overriding distal fibular fracture 2. Severely comminuted, angulated intra-articular fracture of the distal tibia. Electronically Signed   By: Donavan Foil M.D.   On: 02/15/2017 22:48   I was able to independently review x-rays and do see that she has a severe distal tibia fracture with intra-articular involvement. She also has displaced fibula fracture proximal to the ankle joint.  Review of Systems  All other systems reviewed and are negative.   Blood pressure (!) 149/34, pulse 65, temperature 98.3 F (36.8 C), temperature source Oral, resp. rate 16, height 5\' 5"  (1.651 m), weight 110 lb (49.9 kg), last menstrual  period 01/18/2017, SpO2 98 %. Physical Exam  Constitutional: She is oriented to person, place, and time. She appears well-developed and well-nourished.  HENT:  Head: Normocephalic and atraumatic.  Eyes: EOM are normal. Pupils are equal, round, and reactive to light.  Neck: Normal range of motion. Neck supple.  Cardiovascular: Normal rate and regular rhythm.   Respiratory: Effort normal and breath sounds normal.  GI: Soft. Bowel sounds are normal.  Musculoskeletal:       Left ankle: She exhibits decreased range of motion, swelling, ecchymosis and deformity. Tenderness. Lateral malleolus and medial malleolus tenderness found.  Neurological: She  is alert and oriented to person, place, and time.  Skin: Skin is warm and dry.  Psychiatric: She has a normal mood and affect.   There is significant deformity of her left ankle. Her foot is well perfused and her compartments are soft. There is an abrasion over the medial aspect of the ankle itself. She reports normal sensation of her foot.   Assessment/Plan Left pilon fracture with significant displacement  1)  The plan will be to proceed to the operating room urgently for placement of a uniplane external fixation spanning the left ankle joint to stabilize the fracture and to allow for soft tissue swelling to subside. She will then be admitted for physical therapy and for a CT scan to better assess the fracture for definitive treatment down the road. I had a long and thorough discussion with her and family at the bedside about her injury and what the operative course will be. A thorough discussion of the risk and benefits was also had. Informed consent is obtained and we'll proceed to surgery this soon.  Mcarthur Rossetti, MD 02/16/2017, 12:30 AM

## 2017-02-16 NOTE — ED Notes (Signed)
Dr. Blackman at bedside. 

## 2017-02-16 NOTE — Progress Notes (Signed)
Orthopedic Tech Progress Note Patient Details:  Angel Hale 07-31-1971 [id number]  Ortho Devices Type of Ortho Device: Crutches Ortho Device/Splint Location: provided crutches at bedside.  Pt was trained and fitted by physcial therapy and is schedule for discharge tomorrow.   (drop off) Ortho Device/Splint Interventions: Other (comment)   Kristopher Oppenheim 02/16/2017, 12:05 PM

## 2017-02-16 NOTE — Anesthesia Procedure Notes (Signed)
Procedure Name: Intubation Date/Time: 02/16/2017 1:22 AM Performed by: Claris Che Pre-anesthesia Checklist: Patient identified, Emergency Drugs available, Suction available, Patient being monitored and Timeout performed Patient Re-evaluated:Patient Re-evaluated prior to inductionOxygen Delivery Method: Circle system utilized Preoxygenation: Pre-oxygenation with 100% oxygen Intubation Type: IV induction, Rapid sequence and Cricoid Pressure applied Laryngoscope Size: Mac and 3 Grade View: Grade I Tube type: Oral Tube size: 8.0 mm Airway Equipment and Method: Stylet Placement Confirmation: ETT inserted through vocal cords under direct vision,  positive ETCO2 and breath sounds checked- equal and bilateral Secured at: 21 cm Tube secured with: Tape Dental Injury: Teeth and Oropharynx as per pre-operative assessment

## 2017-02-16 NOTE — Progress Notes (Signed)
Subjective: Day of Surgery Procedure(s) (LRB): EXTERNAL FIXATION LEG (Left) Patient reports pain as moderate.    Objective: Vital signs in last 24 hours: Temp:  [97.5 F (36.4 C)-98.7 F (37.1 C)] 98.7 F (37.1 C) (06/25 0316) Pulse Rate:  [55-101] 58 (06/25 0316) Resp:  [11-21] 18 (06/25 0316) BP: (111-149)/(34-84) 120/68 (06/25 0316) SpO2:  [94 %-100 %] 100 % (06/25 0316) Weight:  [110 lb (49.9 kg)] 110 lb (49.9 kg) (06/24 2225)  Intake/Output from previous day: 06/24 0701 - 06/25 0700 In: 1250 [I.V.:1250] Out: 0  Intake/Output this shift: Total I/O In: 1250 [I.V.:1250] Out: 0    Recent Labs  02/15/17 2359  HGB 13.1    Recent Labs  02/15/17 2359  WBC 17.2*  RBC 3.98  HCT 38.6  PLT 205    Recent Labs  02/15/17 2359  NA 138  K 3.5  CL 108  CO2 21*  BUN 13  CREATININE 0.71  GLUCOSE 121*  CALCIUM 8.6*   No results for input(s): LABPT, INR in the last 72 hours.  Sensation intact distally Intact pulses distally Incision: scant drainage Compartment soft  Assessment/Plan: Day of Surgery Procedure(s) (LRB): EXTERNAL FIXATION LEG (Left) Will obtain CT scan of the left ankle today to better assess her fracture. Will need PT and OT today for mobility and ADL's with non-weight bearing on her left ankle. Definitive surgery will not be for at least a week due to the need to left soft-tissue swelling subside. Will discharge to home tomorrow.  Mcarthur Rossetti 02/16/2017, 6:45 AM

## 2017-02-16 NOTE — Op Note (Signed)
NAME:  Angel Hale, Angel Hale NO.:  [id number]  MEDICAL RECORD NO.:  [id number]  LOCATION:  [facility]                         FACILITY:  [facility]  PHYSICIAN:  Lind Guest. Ninfa Linden, M.D.DATE OF BIRTH:  09/29/1970  DATE OF PROCEDURE:  02/16/2017 DATE OF DISCHARGE:                              OPERATIVE REPORT   PREOPERATIVE DIAGNOSIS:  Left ankle severely displaced closed pilon fracture.  POSTOPERATIVE DIAGNOSIS:  Left ankle severely displaced closed pilon fracture.  PROCEDURE:  Uniplane external fixation and spanning, left ankle joint.  IMPLANTS:  Stryker external fixation system.  SURGEON:  Lind Guest. Ninfa Linden, M.D.  ASSISTANT:  Erskine Emery, PA-C.  ANESTHESIA:  General.  BLOOD LOSS:  Minimal.  COMPLICATIONS:  None.  INDICATIONS:  Ms. [name] is a 46 year old female, who sustained a mechanical fall off her horse when it bucked her late last evening.  She was seen in the emergency room and found to have a comminuted displaced left ankle pilon fracture.  She has been taken to the operating room urgently for external fixation to span the ankle joint to get the pressure off the soft tissue as well as allow for some ligamentotaxis and soft tissue swelling to subside while we wait for definitive treatment of this injury.  The risks and benefits of this were explained to her in detail, and she did wish to proceed with surgery.  PROCEDURE DESCRIPTION:  After informed consent was obtained, appropriate left ankle was marked.  She was brought to the operating room and placed supine on the operating table.  General anesthesia was then obtained. Her left ankle was pre-scrubbed with soap and Betadine, and we dried this off, and then, we placed DuraPrep as a prep around the entire leg from the thigh down the toes.  Time-out was called and was identified as correct patient, correct left ankle. We then placed 2 anterior to posterior Steinmann pins for the external  fixation through the tibia, proximal to the fracture site, and we placed one through the calcaneus. All this was done under direct fluoroscopy and visualization.  We then constructed our external fixation spanning the ankle joint and pulled some traction and rotated the ankle to take pressure off the soft tissue and to get the ankle as adequately aligned as possible.  We then locked the external fixation down and put well-padded sterile dressing around this.  She was awakened, extubated, and taken to the recovery room in stable condition.  All final counts were correct.  There were no complications noted.  Postoperatively, we will obtain a CT scan within the next 24 hours to better assess the ankle joint and have her set up for definitive treatment and at least a week to allow for soft tissue healing.  Of note, Erskine Emery, PA-C's assistance was crucial for this case.     Lind Guest. Ninfa Linden, M.D.     CYB/MEDQ  D:  02/16/2017  T:  02/16/2017  Job:  [id number]

## 2017-02-16 NOTE — Evaluation (Signed)
Physical Therapy Evaluation Patient Details Name: Angel Hale MRN: [id number] DOB: [birth year] Today's Date: 02/16/2017   History of Present Illness  46 year old female who sustained a mechanical fall off of her horse.  X-rays showing left ankle pilon fracture with significant displacement and angulation. Underwent external fixator on 02/16/17. Definitive surgery will not be for at least a week due to the need to left soft-tissue swelling subside. PHM including   Clinical Impression  Pt tolerated OOB mobility well. Pt demo'd safe amb and stair negotiation with crutches. Acute PT to see in morning prior to d/c home for crutch training and stair negotiation review.    Follow Up Recommendations Supervision - Intermittent    Equipment Recommendations  Crutches    Recommendations for Other Services       Precautions / Restrictions Precautions Precautions: Fall Restrictions Weight Bearing Restrictions: Yes LLE Weight Bearing: Non weight bearing      Mobility  Bed Mobility Overal bed mobility: Needs Assistance Bed Mobility: Supine to Sit     Supine to sit: Supervision     General bed mobility comments: pt up in chair upon PT arrival  Transfers Overall transfer level: Needs assistance Equipment used: Rolling walker (2 wheeled) Transfers: Sit to/from Stand Sit to Stand: Min guard         General transfer comment: Min guard for safety  Ambulation/Gait Ambulation/Gait assistance: Min guard Ambulation Distance (Feet): 60 Feet (x2) Assistive device: Rolling walker (2 wheeled);Crutches Gait Pattern/deviations: Step-to pattern Gait velocity: slow Gait velocity interpretation: Below normal speed for age/gender General Gait Details: ambulated both with RW and axillary crutches. Pt able to maintain L LE NWB with both.  Stairs Stairs: Yes Stairs assistance: Min guard Stair Management: Step to pattern;No rails;Backwards;With crutches Number of Stairs: 2 General stair  comments: v/c's for sequencing, pt able to clear step without problem  Wheelchair Mobility    Modified Rankin (Stroke Patients Only)       Balance Overall balance assessment: Needs assistance Sitting-balance support: No upper extremity supported;Feet supported Sitting balance-Leahy Scale: Good     Standing balance support: During functional activity;Bilateral upper extremity supported Standing balance-Leahy Scale: Fair Standing balance comment: pt needs AD for safe ambulation                             Pertinent Vitals/Pain Pain Assessment: 0-10 Pain Score: 5  Pain Location: L foot and leg Pain Descriptors / Indicators: Discomfort;Grimacing;Throbbing Pain Intervention(s): Monitored during session    Home Living Family/patient expects to be discharged to:: Private residence Living Arrangements: Spouse/significant other Available Help at Discharge: Family;Available PRN/intermittently Type of Home: House Home Access: Stairs to enter Entrance Stairs-Rails: None Entrance Stairs-Number of Steps: 2 Home Layout: Two level;Bed/bath upstairs Home Equipment: Shower seat - built in Additional Comments: 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 Very active    Prior Function Level of Independence: Independent         Comments: ADLs, IADLs, driving; has horses      Hand Dominance   Dominant Hand: Right    Extremity/Trunk Assessment   Upper Extremity Assessment Upper Extremity Assessment: Overall WFL for tasks assessed    Lower Extremity Assessment Lower Extremity Assessment: LLE deficits/detail LLE Deficits / Details:  left ankle pilon fracture with external fixator LLE: Unable to fully assess due to immobilization LLE Sensation:  (Intact to light touch) LLE Coordination: decreased fine motor    Cervical / Trunk Assessment Cervical / Trunk Assessment:  Normal  Communication   Communication: No difficulties  Cognition Arousal/Alertness: Awake/alert Behavior  During Therapy: WFL for tasks assessed/performed Overall Cognitive Status: Within Functional Limits for tasks assessed                                        General Comments General comments (skin integrity, edema, etc.): SpO2 ranging from 100-95% on roomair    Exercises General Exercises - Lower Extremity Ankle Circles/Pumps:  (wiggle toes on the L) Quad Sets: AROM;Left;10 reps;Seated Gluteal Sets: AROM;Both;10 reps;Seated   Assessment/Plan    PT Assessment Patient needs continued PT services  PT Problem List Decreased strength;Decreased range of motion;Decreased activity tolerance;Decreased balance;Decreased mobility;Decreased knowledge of use of DME;Pain       PT Treatment Interventions DME instruction;Gait training;Stair training;Functional mobility training;Therapeutic activities;Therapeutic exercise;Balance training    PT Goals (Current goals can be found in the Care Plan section)  Acute Rehab PT Goals Patient Stated Goal: Return to PLOF PT Goal Formulation: With patient Time For Goal Achievement: 02/23/17 Potential to Achieve Goals: Good    Frequency Min 4X/week   Barriers to discharge        Co-evaluation               AM-PAC PT "6 Clicks" Daily Activity  Outcome Measure Difficulty turning over in bed (including adjusting bedclothes, sheets and blankets)?: A Little Difficulty moving from lying on back to sitting on the side of the bed? : A Little Difficulty sitting down on and standing up from a chair with arms (e.g., wheelchair, bedside commode, etc,.)?: A Little Help needed moving to and from a bed to chair (including a wheelchair)?: A Little Help needed walking in hospital room?: A Little Help needed climbing 3-5 steps with a railing? : A Little 6 Click Score: 18    End of Session Equipment Utilized During Treatment: Gait belt Activity Tolerance: Patient tolerated treatment well Patient left: in chair;with call bell/phone within  reach;with family/visitor present Nurse Communication: Mobility status PT Visit Diagnosis: Unsteadiness on feet (R26.81);Pain Pain - Right/Left: Left Pain - part of body: Ankle and joints of foot    Time: 1035-1106 PT Time Calculation (min) (ACUTE ONLY): 31 min   Charges:   PT Evaluation $PT Eval Moderate Complexity: 1 Procedure PT Treatments $Gait Training: 8-22 mins   PT G Codes:        Kittie Plater, PT, DPT Pager #: [phone] Office #: [phone]   [place] 02/16/2017, 11:26 AM

## 2017-02-16 NOTE — Brief Op Note (Signed)
02/15/2017 - 02/16/2017  2:18 AM  PATIENT:  Angel Hale  46 y.o. female  PRE-OPERATIVE DIAGNOSIS:  Left Ankle Fracture  POST-OPERATIVE DIAGNOSIS:  Left Ankle fracture  PROCEDURE:  Procedure(s): EXTERNAL FIXATION LEG (Left)  SURGEON:  Surgeon(s) and Role:    Mcarthur Rossetti, MD - Primary  PHYSICIAN ASSISTANT: Benita Stabile, PA-c  ANESTHESIA:   general  EBL:  Total I/O In: 1000 [I.V.:1000] Out: -   COUNTS:  YES  TOURNIQUET:  * No tourniquets in log *  PLAN OF CARE: Admit to inpatient   PATIENT DISPOSITION:  PACU - hemodynamically stable.   Delay start of Pharmacological VTE agent (>24hrs) due to surgical blood loss or risk of bleeding: no

## 2017-02-17 ENCOUNTER — Encounter (HOSPITAL_COMMUNITY): Payer: Self-pay | Admitting: Orthopaedic Surgery

## 2017-02-17 MED ORDER — ASPIRIN 325 MG PO TABS: 325.0000 mg | ORAL_TABLET | Freq: Every day | ORAL | 0 refills | Status: DC

## 2017-02-17 MED ORDER — OXYCODONE-ACETAMINOPHEN 5-325 MG PO TABS: 1.0000 | ORAL_TABLET | ORAL | 0 refills | Status: DC | PRN

## 2017-02-17 NOTE — Progress Notes (Signed)
Patient ID: Angel Hale, female   DOB: 1971/04/30, 46 y.o.   MRN: [id number] No acute changes.  Had CT scan of her left ankle.  Doing well overall.  Can be discharged to home today.  Definitive surgery to fix her ankle will be scheduled in 1-2 weeks depending on her soft tissues.

## 2017-02-17 NOTE — Progress Notes (Signed)
Reviewed discharge instructions/medication with patient and patient's husband.  Answered their questions. Patient is stable and ready for discharge.  Calling volunteer to take her out.

## 2017-02-17 NOTE — Discharge Instructions (Signed)
No weight on your left ankle.  Ice and elevation to decrease swelling. Do take a 325 mg aspirin daily. We will call about surgery dates.

## 2017-02-17 NOTE — Progress Notes (Signed)
Physical Therapy Treatment Patient Details Name: Angel Hale MRN: [id number] DOB: [birth year] Today's Date: 02/17/2017    History of Present Illness 46 year old female who sustained a mechanical fall off of her horse.  X-rays showing left ankle pilon fracture with significant displacement and angulation. Underwent external fixator on 02/16/17. Definitive surgery will not be for at least a week due to the need to left soft-tissue swelling subside.     PT Comments    Pt progressing well. Pt demo'd safe crutch ambulation and is able to negotiate stairs with crutches. Spouse present. Acute PT to con't to follow to improve amb tolerance.   Follow Up Recommendations  Supervision - Intermittent     Equipment Recommendations  Crutches    Recommendations for Other Services       Precautions / Restrictions Precautions Precautions: Fall Precaution Comments: ex fix to left ankle Restrictions Weight Bearing Restrictions: Yes LLE Weight Bearing: Non weight bearing    Mobility  Bed Mobility Overal bed mobility: Modified Independent Bed Mobility: Supine to Sit;Sit to Supine           General bed mobility comments: pt able to manage L LE in/out of bed with use of UEs  Transfers Overall transfer level: Needs assistance Equipment used: Rolling walker (2 wheeled);Crutches Transfers: Sit to/from Stand Sit to Stand: Min guard         General transfer comment: v/c's and demo'd crutch management in one hand and reaching back with other hand  Ambulation/Gait Ambulation/Gait assistance: Min guard Ambulation Distance (Feet): 100 Feet (x2) Assistive device: Crutches Gait Pattern/deviations: Step-to pattern Gait velocity: slow Gait velocity interpretation: Below normal speed for age/gender General Gait Details: pt demo'd safe amb with crutches with no episodes of LOB   Stairs Stairs: Yes   Stair Management: Step to pattern;No rails;Backwards;With crutches Number of Stairs:  2 General stair comments: v/c's for sequencing, pt able to clear step without problem  Wheelchair Mobility    Modified Rankin (Stroke Patients Only)       Balance Overall balance assessment: Needs assistance Sitting-balance support: No upper extremity supported;Feet supported Sitting balance-Leahy Scale: Good     Standing balance support: During functional activity;Bilateral upper extremity supported Standing balance-Leahy Scale: Fair Standing balance comment: pt needs AD for safe ambulation                            Cognition Arousal/Alertness: Awake/alert Behavior During Therapy: WFL for tasks assessed/performed Overall Cognitive Status: Within Functional Limits for tasks assessed                                        Exercises      General Comments        Pertinent Vitals/Pain Pain Assessment: 0-10 Pain Score: 8  Pain Location: L foot Pain Descriptors / Indicators: Discomfort;Grimacing;Throbbing Pain Intervention(s): Monitored during session    Home Living                      Prior Function            PT Goals (current goals can now be found in the care plan section) Acute Rehab PT Goals 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 : None Difficulty sitting down on and standing up from a chair with arms (e.g., wheelchair, bedside commode, etc,.)?: A Little Help needed moving to and from a bed to chair (including a wheelchair)?: A Little Help needed walking in hospital room?: A Little Help needed climbing 3-5 steps with a railing? : A  Little 6 Click Score: 20    End of Session Equipment Utilized During Treatment: Gait belt Activity Tolerance: Patient tolerated treatment well Patient left: in bed;with call bell/phone within reach;with family/visitor present Nurse Communication: Mobility status PT Visit Diagnosis: Unsteadiness on feet (R26.81);Pain Pain - Right/Left: Left Pain - part of body: Ankle and joints of foot     Time: 4818-5909 PT Time Calculation (min) (ACUTE ONLY): 27 min  Charges:  $Gait Training: 23-37 mins                    G Codes:       Kittie Plater, PT, DPT Pager #: [phone] Office #: [phone]    [place] 02/17/2017, 11:33 AM

## 2017-02-17 NOTE — Discharge Summary (Signed)
Patient ID: Angel Hale MRN: [id number] DOB/AGE: 44/05/1971 44 y.o.  Admit date: 02/15/2017 Discharge date: 02/17/2017  Admission Diagnoses:  Principal Problem:   Closed left pilon fracture, initial encounter   Discharge Diagnoses:  Same  History reviewed. No pertinent past medical history.  Surgeries: Procedure(s): EXTERNAL FIXATION LEG on 02/15/2017 - 02/16/2017   Consultants:   Discharged Condition: Improved  Hospital Course: Angel Hale is an 46 y.o. female who was admitted 02/15/2017 for operative treatment ofClosed left pilon fracture, initial encounter. Patient has severe unremitting pain that affects sleep, daily activities, and work/hobbies. After pre-op clearance the patient was taken to the operating room on 02/15/2017 - 02/16/2017 and underwent  Procedure(s): EXTERNAL FIXATION LEG.    Patient was given perioperative antibiotics: Anti-infectives    Start     Dose/Rate Route Frequency Ordered Stop   02/16/17 0600  ceFAZolin (ANCEF) IVPB 1 g/50 mL premix     1 g 100 mL/hr over 30 Minutes Intravenous Every 6 hours 02/16/17 0313 02/16/17 1852   02/15/17 2330  ceFAZolin (ANCEF) IVPB 1 g/50 mL premix     1 g 100 mL/hr over 30 Minutes Intravenous  Once 02/15/17 2326 02/16/17 0043       Patient was given sequential compression devices, early ambulation, and chemoprophylaxis to prevent DVT.  Patient benefited maximally from hospital stay and there were no complications.    Recent vital signs: Patient Vitals for the past 24 hrs:  BP Temp Temp src Pulse SpO2  02/17/17 0500 102/73 98 F (36.7 C) Oral 60 99 %  02/16/17 2300 (!) 110/41 98.6 F (37 C) Oral (!) 52 99 %  02/16/17 1319 103/63 98.7 F (37.1 C) Oral 60 99 %  02/16/17 0913 116/69 98.2 F (36.8 C) Oral 73 100 %     Recent laboratory studies:  Recent Labs  02/15/17 2359  WBC 17.2*  HGB 13.1  HCT 38.6  PLT 205  NA 138  K 3.5  CL 108  CO2 21*  BUN 13  CREATININE 0.71  GLUCOSE 121*  CALCIUM 8.6*      Discharge Medications:   Allergies as of 02/17/2017      Reactions   Penicillins Nausea And Vomiting      Medication List    TAKE these medications   aspirin 325 MG tablet Take 1 tablet (325 mg total) by mouth daily.   oxyCODONE-acetaminophen 5-325 MG tablet Commonly known as:  ROXICET Take 1-2 tablets by mouth every 4 (four) hours as needed.            Durable Medical Equipment        Start     Ordered   02/16/17 1128  For home use only DME Crutches  Once     02/16/17 1128      Diagnostic Studies: Dg Chest 2 View  Result Date: 02/15/2017 CLINICAL DATA:  Preop left ankle fracture EXAM: CHEST  2 VIEW COMPARISON:  None. FINDINGS: The lungs are clear. The pulmonary vasculature is normal. Heart size is normal. Hilar and mediastinal contours are unremarkable. There is no pleural effusion. IMPRESSION: No active cardiopulmonary disease. Electronically Signed   By: Andreas Newport M.D.   On: 02/15/2017 23:58   Dg Ankle 2 Views Left  Result Date: 02/16/2017 CLINICAL DATA:  External fixation EXAM: LEFT ANKLE - 2 VIEW; DG C-ARM 61-120 MIN COMPARISON:  02/15/2017 FINDINGS: Total fluoroscopy time was 49 seconds. Two low resolution intraoperative spot views of the left ankle are submitted. The images demonstrate a  comminuted, displaced and overriding distal fibular fracture with decreased angulation. Severely comminuted did fracture of the distal tibia with decreased angulation compared to prior. Posteriorly displaced fracture fragment, likely from the posterior malleolus. IMPRESSION: Intraoperative fluoroscopic assistance provided during external fixation of left ankle fractures Electronically Signed   By: Donavan Foil M.D.   On: 02/16/2017 03:53   Dg Ankle 2 Views Left  Result Date: 02/15/2017 CLINICAL DATA:  [place] off of a horse and injured the left ankle EXAM: LEFT ANKLE - 2 VIEW COMPARISON:  None. FINDINGS: Comminuted fracture involving the distal diaphysis of the fibula.  There is moderate lateral and posterior angulation of the distal fracture fragment. Superiorly displaced 9 mm fracture fragment at the fibular fracture site with approximately 15 mm of overriding. Severely comminuted intra-articular fracture of the distal tibia with distraction of fracture fragments and lateral and posterior angulation of the distal fracture fragments. Widened and disrupted superolateral mortise. IMPRESSION: 1. Comminuted, angulated and overriding distal fibular fracture 2. Severely comminuted, angulated intra-articular fracture of the distal tibia. Electronically Signed   By: Donavan Foil M.D.   On: 02/15/2017 22:48   Dg C-arm 1-60 Min  Result Date: 02/16/2017 CLINICAL DATA:  External fixation EXAM: LEFT ANKLE - 2 VIEW; DG C-ARM 61-120 MIN COMPARISON:  02/15/2017 FINDINGS: Total fluoroscopy time was 49 seconds. Two low resolution intraoperative spot views of the left ankle are submitted. The images demonstrate a comminuted, displaced and overriding distal fibular fracture with decreased angulation. Severely comminuted did fracture of the distal tibia with decreased angulation compared to prior. Posteriorly displaced fracture fragment, likely from the posterior malleolus. IMPRESSION: Intraoperative fluoroscopic assistance provided during external fixation of left ankle fractures Electronically Signed   By: Donavan Foil M.D.   On: 02/16/2017 03:53    Disposition: to home  Discharge Instructions    Discharge patient    Complete by:  As directed    Discharge disposition:  01-Home or Self Care   Discharge patient date:  02/17/2017      Follow-up Information    Mcarthur Rossetti, MD Follow up.   Specialty:  Orthopedic Surgery Why:  we will call with surgery date in the next 1-2 weeks Contact information: [place] [address] [phone]            Signed: Mcarthur Rossetti 02/17/2017, 7:26 AM

## 2017-02-19 ENCOUNTER — Telehealth (INDEPENDENT_AMBULATORY_CARE_PROVIDER_SITE_OTHER): Payer: Self-pay

## 2017-02-19 NOTE — Telephone Encounter (Signed)
Patient is calling concerning a blister that is on the inside of her left ankle.  Would like to know if she needs to be concerned or what she needs to do? CB# [phone].  Please Advise.

## 2017-02-20 NOTE — Telephone Encounter (Signed)
She just needs to put some triple antibiotic ointment on it daily

## 2017-02-20 NOTE — Telephone Encounter (Signed)
Patient aware of the below message  

## 2017-02-20 NOTE — Telephone Encounter (Signed)
Please advise 

## 2017-02-23 ENCOUNTER — Other Ambulatory Visit (INDEPENDENT_AMBULATORY_CARE_PROVIDER_SITE_OTHER): Payer: Self-pay | Admitting: Orthopaedic Surgery

## 2017-02-23 DIAGNOSIS — S82872A Displaced pilon fracture of left tibia, initial encounter for closed fracture: Secondary | ICD-10-CM

## 2017-02-23 NOTE — Pre-Procedure Instructions (Signed)
Angel Hale  02/23/2017      [place], [place] [address] [place] [address] Phone: [phone] Fax: [phone]    Your procedure is scheduled on July 10  Report to [facility] Admitting at 2:20 P.M.  Call this number if you have problems the morning of surgery:  [phone]   Remember:  Do not eat food or drink liquids after midnight..   Take these medicines the morning of surgery with A SIP OF WATER oxyCODONE-acetaminophen (ROXICET)  7 days prior to surgery STOP taking any Aspirin, Aleve, Naproxen, Ibuprofen, Motrin, Advil, Goody's, BC's, all herbal medications, fish oil, and all vitamins    Do not wear jewelry, make-up or nail polish.  Do not wear lotions, powders, or perfumes, or deoderant.  Do not shave 48 hours prior to surgery.    Do not bring valuables to the hospital.  [facility] is not responsible for any belongings or valuables.  Contacts, dentures or bridgework may not be worn into surgery.  Leave your suitcase in the car.  After surgery it may be brought to your room.  For patients admitted to the hospital, discharge time will be determined by your treatment team.  Patients discharged the day of surgery will not be allowed to drive home.    Special instructions:   [place]- Preparing For Surgery  Before surgery, you can play an important role. Because skin is not sterile, your skin needs to be as free of germs as possible. You can reduce the number of germs on your skin by washing with CHG (chlorahexidine gluconate) Soap before surgery.  CHG is an antiseptic cleaner which kills germs and bonds with the skin to continue killing germs even after washing.  Please do not use if you have an allergy to CHG or antibacterial soaps. If your skin becomes reddened/irritated stop using the CHG.  Do not shave (including legs and underarms) for at least 48 hours prior to first CHG shower. It is  OK to shave your face.  Please follow these instructions carefully.   1. Shower the NIGHT BEFORE SURGERY and the MORNING OF SURGERY with CHG.   2. If you chose to wash your hair, wash your hair first as usual with your normal shampoo.  3. After you shampoo, rinse your hair and body thoroughly to remove the shampoo.  4. Use CHG as you would any other liquid soap. You can apply CHG directly to the skin and wash gently with a scrungie or a clean washcloth.   5. Apply the CHG Soap to your body ONLY FROM THE NECK DOWN.  Do not use on open wounds or open sores. Avoid contact with your eyes, ears, mouth and genitals (private parts). Wash genitals (private parts) with your normal soap.  6. Wash thoroughly, paying special attention to the area where your surgery will be performed.  7. Thoroughly rinse your body with warm water from the neck down.  8. DO NOT shower/wash with your normal soap after using and rinsing off the CHG Soap.  9. Pat yourself dry with a CLEAN TOWEL.   10. Wear CLEAN PAJAMAS   11. Place CLEAN SHEETS on your bed the night of your first shower and DO NOT SLEEP WITH PETS.    Day of Surgery: Do not apply any deodorants/lotions. Please wear clean clothes to the hospital/surgery center.      Please read over the following fact sheets that  you were given.

## 2017-02-24 ENCOUNTER — Encounter (HOSPITAL_COMMUNITY): Payer: Self-pay | Admitting: *Deleted

## 2017-02-24 ENCOUNTER — Encounter (HOSPITAL_COMMUNITY)
Admission: RE | Admit: 2017-02-24 | Discharge: 2017-02-24 | Disposition: A | Discharge status: Home / Self Care | Payer: Self-pay | Source: Ambulatory Visit | Attending: Orthopaedic Surgery | Admitting: Orthopaedic Surgery

## 2017-02-24 DIAGNOSIS — S82872A Displaced pilon fracture of left tibia, initial encounter for closed fracture: Secondary | ICD-10-CM | POA: Insufficient documentation

## 2017-02-24 DIAGNOSIS — Z01812 Encounter for preprocedural laboratory examination: Secondary | ICD-10-CM | POA: Insufficient documentation

## 2017-02-24 LAB — BASIC METABOLIC PANEL
Anion gap: 9 (ref 5–15)
BUN: 13 mg/dL (ref 6–20)
CHLORIDE: 103 mmol/L (ref 101–111)
CO2: 25 mmol/L (ref 22–32)
Calcium: 9.5 mg/dL (ref 8.9–10.3)
Creatinine, Ser: 0.75 mg/dL (ref 0.44–1.00)
GFR calc Af Amer: >60 mL/min (ref >60)
GFR calc non Af Amer: >60 mL/min (ref >60)
Glucose, Bld: 96 mg/dL (ref 65–99)
POTASSIUM: 3.9 mmol/L (ref 3.5–5.1)
SODIUM: 137 mmol/L (ref 135–145)

## 2017-02-24 LAB — HCG, QUANTITATIVE, PREGNANCY: hCG, Beta Chain, Quant, S: 3 m[IU]/mL (ref <5)

## 2017-02-24 LAB — CBC
HEMATOCRIT: 41.3 % (ref 36.0–46.0)
HEMOGLOBIN: 14.2 g/dL (ref 12.0–15.0)
MCH: 33 pg (ref 26.0–34.0)
MCHC: 34.4 g/dL (ref 30.0–36.0)
MCV: 96 fL (ref 78.0–100.0)
Platelets: 282 10*3/uL (ref 150–400)
RBC: 4.3 MIL/uL (ref 3.87–5.11)
RDW: 13.1 % (ref 11.5–15.5)
WBC: 9.4 10*3/uL (ref 4.0–10.5)

## 2017-02-24 LAB — SURGICAL PCR SCREEN
MRSA, PCR: NEGATIVE
Staphylococcus aureus: POSITIVE — AB

## 2017-02-24 LAB — HCG, SERUM, QUALITATIVE: Preg, Serum: POSITIVE — AB

## 2017-02-24 NOTE — Progress Notes (Addendum)
Called Dr. Trevor Mace office and spoke to [facility] regarding weak positive preg test.  Toni Arthurs made aware.  Add-on lab requested for quantitative Hcg.

## 2017-02-24 NOTE — Progress Notes (Signed)
PCP: Denies Cardiologist: Denies  EKG: 01/2017 CXR: 01/2017 ECHO, Stress Test, Cardiac Cath: Denies  Pt instructed to call Dr. Trevor Mace office when to stop ASA.  Patient denies shortness of breath, fever, cough, and chest pain at PAT appointment.  Patient verbalized understanding of instructions provided today at the PAT appointment.  Patient asked to review instructions at home and day of surgery.

## 2017-02-26 NOTE — Progress Notes (Signed)
Anesthesia Chart Review: Patient is a 46 year old female scheduled for ORIF left pilon fracture and left lateral malleolus on 03/03/2017 by Dr. Jean Rosenthal.  History includes smoking, wisdom teeth extraction, ovarian cyst removal. She sustained a left comminuted, angulated distal tib-fib ankle fracture after falling from a horse on 02/15/17 and underwent left ankle external fixation 02/16/17.   No PCP is listed.   Meds include ASA 325 mg, Roxicet.  BP 109/82   Pulse 63   Temp 36.9 C   Resp (!) 82   Ht 5\' 5"  (1.651 m)   Wt 120 lb (54.4 kg)   LMP 02/03/2017 (Within Weeks)   SpO2 100%   BMI 19.97 kg/m   EKG 02/15/17: SR, prolonged PR interval (first degree AV block), probable anterior infarct (old), non-specific T wave abnormalities lateral leads. Interpretation limited secondary to artifact. (I measured PR interval at 220 ms).  CXR 02/15/17: IMPRESSION: No active cardiopulmonary disease.  Left ankle CT 02/16/17: FINDINGS: - Bones/Joint/Cartilage - There is a comminuted fracture of the distal fibular shaft with approximately 6.4 mm of overriding of the fracture fragments with medial displacement of the distal fragment. There is a fragment of cortex which is rotated 90 degrees. - There is a severely comminuted impaction fracture of the distal tibia. Impaction is at least 5 mm. The articular surface of the distal tibia is superiorly comminuted with the 16 mm gap in the articular surface. The fracture extends approximately 6 cm proximal to the tibiotalar joint. The fracture involves the medial and posterior malleoli of the distal tibia. There is a fragment which is displaced distally lying posterior to the distal lateral malleolus. - There is no dislocation. IMPRESSION: 1. Severely comminuted fracture of the distal tibia as described. 2. Displaced overriding fracture of the distal fibula shaft.  Preoperative labs noted. Serum qualitative hCG was weakly positive, so  quantitative hCG added which was 3, consistent with a non-pregnant female (quantiative result called to [place] at Dr. Trevor Mace office). Patient also had a POC ISTAT quantitative hCG was < 5.0 on 02/16/17. LMP ~ 02/03/17.   If no acute changes then I anticipate that she can proceed as planned. Defer decision for any additional preoperative lab testing to anesthesiologist and/or surgeon.   [facility] Stay Center/Anesthesiology Phone [phone] 02/26/2017 2:32 PM

## 2017-03-02 ENCOUNTER — Telehealth (INDEPENDENT_AMBULATORY_CARE_PROVIDER_SITE_OTHER): Payer: Self-pay | Admitting: *Deleted

## 2017-03-02 NOTE — Telephone Encounter (Signed)
Please advise 

## 2017-03-02 NOTE — Telephone Encounter (Signed)
See if we can just call in tylenol #3 1-2 every 8 hours as needed, #10 for today and zanaflex 4 mg po 3 times daily as needed for pain, #10

## 2017-03-02 NOTE — Telephone Encounter (Signed)
Pt called stating she has one pill left of oxycodone. She stated she has surgery tomorrow. She is having anxiety about running out. She stated she has been instructed to take 1 the morning of her surgery.

## 2017-03-02 NOTE — Telephone Encounter (Signed)
Rx called into pharmacy Pharmacist states she will call and let patient know

## 2017-03-03 ENCOUNTER — Ambulatory Visit (HOSPITAL_COMMUNITY): Payer: Self-pay

## 2017-03-03 ENCOUNTER — Ambulatory Visit (HOSPITAL_COMMUNITY): Payer: Self-pay | Admitting: Certified Registered"

## 2017-03-03 ENCOUNTER — Inpatient Hospital Stay (HOSPITAL_COMMUNITY)
Admission: RE | Admit: 2017-03-03 | Discharge: 2017-03-05 | DRG: 494 | Disposition: A | Discharge status: Home / Self Care | Payer: Self-pay | Source: Ambulatory Visit | Attending: Orthopaedic Surgery | Admitting: Orthopaedic Surgery

## 2017-03-03 ENCOUNTER — Encounter (HOSPITAL_COMMUNITY): Payer: Self-pay | Admitting: Certified Registered Nurse Anesthetist

## 2017-03-03 ENCOUNTER — Encounter (HOSPITAL_COMMUNITY)
Admission: RE | Disposition: A | Discharge status: Home / Self Care | Payer: Self-pay | Source: Ambulatory Visit | Attending: Orthopaedic Surgery

## 2017-03-03 ENCOUNTER — Ambulatory Visit (HOSPITAL_COMMUNITY): Payer: Self-pay | Admitting: Vascular Surgery

## 2017-03-03 DIAGNOSIS — S82872A Displaced pilon fracture of left tibia, initial encounter for closed fracture: Principal | ICD-10-CM | POA: Diagnosis present

## 2017-03-03 DIAGNOSIS — Y9352 Activity, horseback riding: Secondary | ICD-10-CM

## 2017-03-03 DIAGNOSIS — Z880 Allergy status to penicillin: Secondary | ICD-10-CM

## 2017-03-03 DIAGNOSIS — Z419 Encounter for procedure for purposes other than remedying health state, unspecified: Secondary | ICD-10-CM

## 2017-03-03 DIAGNOSIS — S8262XA Displaced fracture of lateral malleolus of left fibula, initial encounter for closed fracture: Secondary | ICD-10-CM | POA: Diagnosis present

## 2017-03-03 DIAGNOSIS — F17210 Nicotine dependence, cigarettes, uncomplicated: Secondary | ICD-10-CM | POA: Diagnosis present

## 2017-03-03 HISTORY — PX: ORIF ANKLE FRACTURE: SHX5408

## 2017-03-03 SURGERY — OPEN REDUCTION INTERNAL FIXATION (ORIF) ANKLE FRACTURE
Anesthesia: General | Site: Ankle | Laterality: Left

## 2017-03-03 MED ORDER — CLINDAMYCIN PHOSPHATE 900 MG/50ML IV SOLN
900.0000 mg | INTRAVENOUS | Status: AC
  Administered 2017-03-03: 900 mg via INTRAVENOUS

## 2017-03-03 MED ORDER — 0.9 % SODIUM CHLORIDE (POUR BTL) OPTIME
TOPICAL | Status: DC | PRN
  Administered 2017-03-03: 1000 mL

## 2017-03-03 MED ORDER — EPHEDRINE SULFATE 50 MG/ML IJ SOLN
INTRAMUSCULAR | Status: DC | PRN
  Administered 2017-03-03 (×3): 5 mg via INTRAVENOUS

## 2017-03-03 MED ORDER — PROMETHAZINE HCL 25 MG/ML IJ SOLN: 6.2500 mg | INTRAMUSCULAR | Status: DC | PRN

## 2017-03-03 MED ORDER — LIDOCAINE HCL (CARDIAC) 20 MG/ML IV SOLN
INTRAVENOUS | Status: DC | PRN
  Administered 2017-03-03: 70 mg via INTRAVENOUS

## 2017-03-03 MED ORDER — TIZANIDINE HCL 2 MG PO TABS
2.0000 mg | ORAL_TABLET | Freq: Three times a day (TID) | ORAL | Status: DC | PRN
  Administered 2017-03-04: 2 mg via ORAL
  Filled 2017-03-03: qty 1

## 2017-03-03 MED ORDER — LACTATED RINGERS IV SOLN
INTRAVENOUS | Status: DC | PRN
  Administered 2017-03-03 (×2): via INTRAVENOUS

## 2017-03-03 MED ORDER — CLINDAMYCIN PHOSPHATE 900 MG/50ML IV SOLN
INTRAVENOUS | Status: AC
  Filled 2017-03-03: qty 50

## 2017-03-03 MED ORDER — OXYCODONE HCL 5 MG PO TABS
5.0000 mg | ORAL_TABLET | ORAL | Status: DC | PRN
  Administered 2017-03-03 – 2017-03-04 (×3): 10 mg via ORAL
  Filled 2017-03-03 (×3): qty 2

## 2017-03-03 MED ORDER — METHOCARBAMOL 1000 MG/10ML IJ SOLN
500.0000 mg | Freq: Four times a day (QID) | INTRAVENOUS | Status: DC | PRN
  Filled 2017-03-03: qty 5

## 2017-03-03 MED ORDER — HYDROMORPHONE HCL 1 MG/ML IJ SOLN: 0.2500 mg | INTRAMUSCULAR | Status: DC | PRN

## 2017-03-03 MED ORDER — KETOROLAC TROMETHAMINE 30 MG/ML IJ SOLN: 30.0000 mg | Freq: Once | INTRAMUSCULAR | Status: DC | PRN

## 2017-03-03 MED ORDER — FENTANYL CITRATE (PF) 100 MCG/2ML IJ SOLN
INTRAMUSCULAR | Status: DC | PRN
  Administered 2017-03-03 (×2): 25 ug via INTRAVENOUS
  Administered 2017-03-03 (×4): 50 ug via INTRAVENOUS

## 2017-03-03 MED ORDER — ONDANSETRON HCL 4 MG/2ML IJ SOLN: 4.0000 mg | Freq: Four times a day (QID) | INTRAMUSCULAR | Status: DC | PRN

## 2017-03-03 MED ORDER — METHOCARBAMOL 500 MG PO TABS
500.0000 mg | ORAL_TABLET | Freq: Four times a day (QID) | ORAL | Status: DC | PRN
  Administered 2017-03-03 – 2017-03-05 (×6): 500 mg via ORAL
  Filled 2017-03-03 (×6): qty 1

## 2017-03-03 MED ORDER — MIDAZOLAM HCL 5 MG/5ML IJ SOLN
INTRAMUSCULAR | Status: DC | PRN
  Administered 2017-03-03: 2 mg via INTRAVENOUS

## 2017-03-03 MED ORDER — MIDAZOLAM HCL 2 MG/2ML IJ SOLN
INTRAMUSCULAR | Status: AC
  Filled 2017-03-03: qty 2

## 2017-03-03 MED ORDER — HYDROMORPHONE HCL 1 MG/ML IJ SOLN
1.0000 mg | INTRAMUSCULAR | Status: DC | PRN
  Administered 2017-03-03 – 2017-03-04 (×5): 1 mg via INTRAVENOUS
  Filled 2017-03-03 (×6): qty 1

## 2017-03-03 MED ORDER — LIDOCAINE-EPINEPHRINE (PF) 2 %-1:200000 IJ SOLN
INTRAMUSCULAR | Status: DC | PRN
  Administered 2017-03-03: 15 mL via PERINEURAL

## 2017-03-03 MED ORDER — PHENYLEPHRINE HCL 10 MG/ML IJ SOLN
INTRAMUSCULAR | Status: DC | PRN
  Administered 2017-03-03: 80 ug via INTRAVENOUS
  Administered 2017-03-03: 120 ug via INTRAVENOUS
  Administered 2017-03-03 (×3): 80 ug via INTRAVENOUS

## 2017-03-03 MED ORDER — FENTANYL CITRATE (PF) 250 MCG/5ML IJ SOLN
INTRAMUSCULAR | Status: AC
  Filled 2017-03-03: qty 5

## 2017-03-03 MED ORDER — SODIUM CHLORIDE 0.9 % IV SOLN
INTRAVENOUS | Status: DC
  Administered 2017-03-03 – 2017-03-04 (×2): via INTRAVENOUS

## 2017-03-03 MED ORDER — METOCLOPRAMIDE HCL 5 MG PO TABS: 5.0000 mg | ORAL_TABLET | Freq: Three times a day (TID) | ORAL | Status: DC | PRN

## 2017-03-03 MED ORDER — LACTATED RINGERS IV SOLN
INTRAVENOUS | Status: DC
  Administered 2017-03-03: 14:00:00 via INTRAVENOUS

## 2017-03-03 MED ORDER — PROPOFOL 10 MG/ML IV BOLUS
INTRAVENOUS | Status: DC | PRN
  Administered 2017-03-03: 200 mg via INTRAVENOUS

## 2017-03-03 MED ORDER — DEXAMETHASONE SODIUM PHOSPHATE 10 MG/ML IJ SOLN
INTRAMUSCULAR | Status: DC | PRN
  Administered 2017-03-03: 10 mg via INTRAVENOUS

## 2017-03-03 MED ORDER — ACETAMINOPHEN 650 MG RE SUPP: 650.0000 mg | Freq: Four times a day (QID) | RECTAL | Status: DC | PRN

## 2017-03-03 MED ORDER — ACETAMINOPHEN 325 MG PO TABS: 650.0000 mg | ORAL_TABLET | Freq: Four times a day (QID) | ORAL | Status: DC | PRN

## 2017-03-03 MED ORDER — METOCLOPRAMIDE HCL 5 MG/ML IJ SOLN
5.0000 mg | Freq: Three times a day (TID) | INTRAMUSCULAR | Status: DC | PRN
Start: 2017-03-03 — End: 2017-03-05

## 2017-03-03 MED ORDER — ONDANSETRON HCL 4 MG/2ML IJ SOLN
INTRAMUSCULAR | Status: DC | PRN
  Administered 2017-03-03: 4 mg via INTRAVENOUS

## 2017-03-03 MED ORDER — ROPIVACAINE HCL 5 MG/ML IJ SOLN
INTRAMUSCULAR | Status: DC | PRN
  Administered 2017-03-03: 25 mL via PERINEURAL

## 2017-03-03 MED ORDER — ONDANSETRON HCL 4 MG PO TABS: 4.0000 mg | ORAL_TABLET | Freq: Four times a day (QID) | ORAL | Status: DC | PRN

## 2017-03-03 MED ORDER — FENTANYL CITRATE (PF) 100 MCG/2ML IJ SOLN
INTRAMUSCULAR | Status: AC
  Administered 2017-03-03: 50 ug
  Filled 2017-03-03: qty 2

## 2017-03-03 MED ORDER — BUPIVACAINE HCL (PF) 0.25 % IJ SOLN
INTRAMUSCULAR | Status: AC
  Filled 2017-03-03: qty 30

## 2017-03-03 MED ORDER — DOCUSATE SODIUM 100 MG PO CAPS
100.0000 mg | ORAL_CAPSULE | Freq: Two times a day (BID) | ORAL | Status: DC
  Administered 2017-03-03 – 2017-03-04 (×3): 100 mg via ORAL
  Filled 2017-03-03 (×3): qty 1

## 2017-03-03 MED ORDER — DIPHENHYDRAMINE HCL 12.5 MG/5ML PO ELIX: 12.5000 mg | ORAL_SOLUTION | ORAL | Status: DC | PRN

## 2017-03-03 MED ORDER — ASPIRIN 325 MG PO TABS
325.0000 mg | ORAL_TABLET | Freq: Every day | ORAL | Status: DC
Start: 2017-03-04 — End: 2017-03-05
  Administered 2017-03-04: 325 mg via ORAL
  Filled 2017-03-03: qty 1

## 2017-03-03 MED ORDER — MIDAZOLAM HCL 2 MG/2ML IJ SOLN
INTRAMUSCULAR | Status: AC
  Administered 2017-03-03: 2 mg
  Filled 2017-03-03: qty 2

## 2017-03-03 SURGICAL SUPPLY — 75 items
BANDAGE ACE 4X5 VEL STRL LF (GAUZE/BANDAGES/DRESSINGS) ×2 IMPLANT
BANDAGE ACE 6X5 VEL STRL LF (GAUZE/BANDAGES/DRESSINGS) ×2 IMPLANT
BANDAGE ESMARK 6X9 LF (GAUZE/BANDAGES/DRESSINGS) IMPLANT
BIT DRILL 2.0 (BIT) ×2
BIT DRILL 2.0MM (BIT) ×1
BIT DRILL 2.5X2.75 QC CALB (BIT) ×2 IMPLANT
BIT DRILL 2XNS DISP SS SM FRAG (BIT) IMPLANT
BIT DRILL CALIBRATED 2.7 (BIT) ×1 IMPLANT
BIT DRILL CALIBRATED 2.7MM (BIT) ×1
BIT DRL 2XNS DISP SS SM FRAG (BIT) ×1
BNDG CMPR 9X6 STRL LF SNTH (GAUZE/BANDAGES/DRESSINGS) ×1
BNDG ESMARK 6X9 LF (GAUZE/BANDAGES/DRESSINGS) ×3
COVER SURGICAL LIGHT HANDLE (MISCELLANEOUS) ×3 IMPLANT
CUFF TOURNIQUET SINGLE 34IN LL (TOURNIQUET CUFF) ×2 IMPLANT
DRAPE C-ARM 42X72 X-RAY (DRAPES) ×3 IMPLANT
DRAPE C-ARMOR (DRAPES) ×2 IMPLANT
DRAPE U-SHAPE 47X51 STRL (DRAPES) ×3 IMPLANT
DRSG PAD ABDOMINAL 8X10 ST (GAUZE/BANDAGES/DRESSINGS) ×4 IMPLANT
DURAPREP 26ML APPLICATOR (WOUND CARE) ×3 IMPLANT
ELECT REM PT RETURN 9FT ADLT (ELECTROSURGICAL) ×3
ELECTRODE REM PT RTRN 9FT ADLT (ELECTROSURGICAL) ×1 IMPLANT
GAUZE SPONGE 4X4 12PLY STRL (GAUZE/BANDAGES/DRESSINGS) ×3 IMPLANT
GAUZE XEROFORM 5X9 LF (GAUZE/BANDAGES/DRESSINGS) ×5 IMPLANT
GLOVE BIO SURGEON STRL SZ7 (GLOVE) ×2 IMPLANT
GLOVE BIO SURGEON STRL SZ8 (GLOVE) ×3 IMPLANT
GLOVE ORTHO TXT STRL SZ7.5 (GLOVE) ×5 IMPLANT
GOWN STRL REUS W/ TWL LRG LVL3 (GOWN DISPOSABLE) ×2 IMPLANT
GOWN STRL REUS W/ TWL XL LVL3 (GOWN DISPOSABLE) ×4 IMPLANT
GOWN STRL REUS W/TWL LRG LVL3 (GOWN DISPOSABLE) ×6
GOWN STRL REUS W/TWL XL LVL3 (GOWN DISPOSABLE) ×12
K-WIRE ACE 1.6X6 (WIRE) ×12
KIT BASIN OR (CUSTOM PROCEDURE TRAY) ×3 IMPLANT
KIT ROOM TURNOVER OR (KITS) ×3 IMPLANT
KWIRE ACE 1.6X6 (WIRE) IMPLANT
MANIFOLD NEPTUNE II (INSTRUMENTS) ×3 IMPLANT
NS IRRIG 1000ML POUR BTL (IV SOLUTION) ×3 IMPLANT
PACK ORTHO EXTREMITY (CUSTOM PROCEDURE TRAY) ×3 IMPLANT
PAD ARMBOARD 7.5X6 YLW CONV (MISCELLANEOUS) ×6 IMPLANT
PAD CAST 4YDX4 CTTN HI CHSV (CAST SUPPLIES) ×2 IMPLANT
PADDING CAST COTTON 4X4 STRL (CAST SUPPLIES) ×6
PADDING CAST COTTON 6X4 STRL (CAST SUPPLIES) ×3 IMPLANT
PLATE 6H LT DIST ANTLAT TIB (Plate) ×2 IMPLANT
PLATE ACE 100DEG 6HOLE (Plate) ×2 IMPLANT
PREFILTER NEPTUNE (MISCELLANEOUS) ×3 IMPLANT
SCREW CORT FT 32X3.5XNONLOCK (Screw) IMPLANT
SCREW CORTICAL 2.7MM  18MM (Screw) ×2 IMPLANT
SCREW CORTICAL 2.7MM 18MM (Screw) IMPLANT
SCREW CORTICAL 3.5MM  12MM (Screw) ×8 IMPLANT
SCREW CORTICAL 3.5MM  28MM (Screw) ×2 IMPLANT
SCREW CORTICAL 3.5MM  32MM (Screw) ×2 IMPLANT
SCREW CORTICAL 3.5MM  34MM (Screw) ×2 IMPLANT
SCREW CORTICAL 3.5MM 12MM (Screw) IMPLANT
SCREW CORTICAL 3.5MM 14MM (Screw) ×4 IMPLANT
SCREW CORTICAL 3.5MM 24MM (Screw) ×2 IMPLANT
SCREW CORTICAL 3.5MM 26MM (Screw) ×2 IMPLANT
SCREW CORTICAL 3.5MM 28MM (Screw) IMPLANT
SCREW CORTICAL 3.5MM 32MM (Screw) ×1 IMPLANT
SCREW CORTICAL 3.5MM 34MM (Screw) IMPLANT
SCREW LOCK 3.5X40 DIST TIB (Screw) ×2 IMPLANT
SCREW LOCK CORT STAR 3.5X46 (Screw) ×4 IMPLANT
SCREW LP 3.5 (Screw) ×2 IMPLANT
SPONGE LAP 4X18 X RAY DECT (DISPOSABLE) ×6 IMPLANT
SUCTION FRAZIER HANDLE 10FR (MISCELLANEOUS) ×4
SUCTION TUBE FRAZIER 10FR DISP (MISCELLANEOUS) ×1 IMPLANT
SUT ETHILON 2 0 FS 18 (SUTURE) ×11 IMPLANT
SUT VIC AB 0 CT1 27 (SUTURE) ×12
SUT VIC AB 0 CT1 27XBRD ANBCTR (SUTURE) IMPLANT
SUT VIC AB 2-0 CT1 27 (SUTURE) ×15
SUT VIC AB 2-0 CT1 27XBRD (SUTURE) ×1 IMPLANT
SUT VIC AB 2-0 CT1 TAPERPNT 27 (SUTURE) IMPLANT
TOWEL OR 17X24 6PK STRL BLUE (TOWEL DISPOSABLE) ×3 IMPLANT
TOWEL OR 17X26 10 PK STRL BLUE (TOWEL DISPOSABLE) ×3 IMPLANT
TUBE CONNECTING 12'X1/4 (SUCTIONS) ×2
TUBE CONNECTING 12X1/4 (SUCTIONS) ×3 IMPLANT
WATER STERILE IRR 1000ML POUR (IV SOLUTION) ×3 IMPLANT

## 2017-03-03 NOTE — Anesthesia Procedure Notes (Signed)
Procedure Name: LMA Insertion Date/Time: 03/03/2017 3:59 PM Performed by: Teressa Lower Pre-anesthesia Checklist: Patient identified, Emergency Drugs available, Suction available, Patient being monitored and Timeout performed Patient Re-evaluated:Patient Re-evaluated prior to inductionOxygen Delivery Method: Circle system utilized Preoxygenation: Pre-oxygenation with 100% oxygen Intubation Type: IV induction Ventilation: Mask ventilation without difficulty LMA: LMA inserted LMA Size: 4.0 Number of attempts: 1 Dental Injury: Teeth and Oropharynx as per pre-operative assessment

## 2017-03-03 NOTE — Anesthesia Preprocedure Evaluation (Signed)
Anesthesia Evaluation  Patient identified by MRN, date of birth, ID band Patient awake    Reviewed: Allergy & Precautions, NPO status , Patient's Chart, lab work & pertinent test results  Airway Mallampati: II  TM Distance: >3 FB Neck ROM: Full    Dental no notable dental hx.    Pulmonary Current Smoker,    Pulmonary exam normal breath sounds clear to auscultation       Cardiovascular negative cardio ROS Normal cardiovascular exam Rhythm:Regular Rate:Normal     Neuro/Psych negative neurological ROS  negative psych ROS   GI/Hepatic negative GI ROS, Neg liver ROS,   Endo/Other  negative endocrine ROS  Renal/GU negative Renal ROS  negative genitourinary   Musculoskeletal negative musculoskeletal ROS (+)   Abdominal   Peds negative pediatric ROS (+)  Hematology negative hematology ROS (+)   Anesthesia Other Findings   Reproductive/Obstetrics negative OB ROS                             Anesthesia Physical Anesthesia Plan  ASA: II  Anesthesia Plan: General   Post-op Pain Management:  Regional for Post-op pain   Induction: Intravenous  PONV Risk Score and Plan: 1 and Ondansetron, Dexamethasone and Propofol  Airway Management Planned: LMA  Additional Equipment:   Intra-op Plan:   Post-operative Plan: Extubation in OR  Informed Consent: I have reviewed the patients History and Physical, chart, labs and discussed the procedure including the risks, benefits and alternatives for the proposed anesthesia with the patient or authorized representative who has indicated his/her understanding and acceptance.   Dental advisory given  Plan Discussed with: CRNA and Surgeon  Anesthesia Plan Comments:         Anesthesia Quick Evaluation

## 2017-03-03 NOTE — H&P (Signed)
Angel Hale is an 46 y.o. female.   Chief Complaint:   Left ankle pain; known Pilon fracture HPI:   46 yo female who sustained a left ankle injury over 2 weeks ago after a fall off of her horse.  Was found to have a severely displaced left ankle pilon fracture and was placed in an external fixator the day of her injury to improve the overall alignment and to allow soft-tissue swelling to lessen.  She presents now for definitive fixation of her left ankle.  Past Medical History:  Diagnosis Date  . Medical history non-contributory     Past Surgical History:  Procedure Laterality Date  . BUNIONECTOMY Left 2000  . EXTERNAL FIXATION LEG Left 02/16/2017   Procedure: EXTERNAL FIXATION LEG;  Surgeon: Mcarthur Rossetti, MD;  Location: [facility];  Service: Orthopedics;  Laterality: Left;  . OVARIAN CYST REMOVAL Left 1991  . WISDOM TOOTH EXTRACTION      Family History  Problem Relation Age of Onset  . Colon cancer Father    Social History:  reports that she has been smoking Cigarettes.  She has a 10.00 pack-year smoking history. She has never used smokeless tobacco. She reports that she drinks alcohol. She reports that she does not use drugs.  Allergies:  Allergies  Allergen Reactions  . Penicillins Nausea And Vomiting    Has patient had a PCN reaction causing immediate rash, facial/tongue/throat swelling, SOB or lightheadedness with hypotension: No Has patient had a PCN reaction causing severe rash involving mucus membranes or skin necrosis: No Has patient had a PCN reaction that required hospitalization: No Has patient had a PCN reaction occurring within the last 10 years: No If all of the above answers are "NO", then may proceed with Cephalosporin use.     No prescriptions prior to admission.    No results found for this or any previous visit (from the past 48 hour(s)). No results found.  Review of Systems  All other systems reviewed and are negative.   Last menstrual period  02/03/2017. Physical Exam  Constitutional: She is oriented to person, place, and time. She appears well-developed and well-nourished.  HENT:  Head: Normocephalic and atraumatic.  Eyes: EOM are normal. Pupils are equal, round, and reactive to light.  Neck: Normal range of motion. Neck supple.  Cardiovascular: Normal rate and regular rhythm.   Respiratory: Effort normal and breath sounds normal.  GI: Soft. Bowel sounds are normal.  Musculoskeletal:       Left ankle: She exhibits decreased range of motion, swelling and deformity. Tenderness. Lateral malleolus and medial malleolus tenderness found.  Neurological: She is alert and oriented to person, place, and time.  Skin: Skin is warm and dry.  Psychiatric: She has a normal mood and affect.     Assessment/Plan Left ankle Pilon fracture post-external fixation  1) To the OR today for definitive treatment of her left pilon fracture.  The risks and benefits of surgery have been explained in detail and infromed consetn is obtained.  Mcarthur Rossetti, MD 03/03/2017, 12:13 PM

## 2017-03-03 NOTE — Progress Notes (Signed)
Received patient from PACU accompanied by family members.  Patient AOx4, has splint and compression wrap at left lower leg.  VS stable, O2Sat at 97% on RA and pain at 10/10 after bed transfer.  Administered PRN pain medication dilaudid 1 mg IV and Robaxin 500 mg tab per order and noted effective.  Patient now resting on bed.

## 2017-03-03 NOTE — Op Note (Signed)
NAME:  Angel Hale, Angel Hale NO.:  [id number]  MEDICAL RECORD NO.:  [id number]  LOCATION:                               FACILITY:  [facility]  PHYSICIAN:  Lind Guest. Ninfa Linden, M.D.DATE OF BIRTH:  1971-06-03  DATE OF PROCEDURE:  03/03/2017 DATE OF DISCHARGE:                              OPERATIVE REPORT   PREOPERATIVE DIAGNOSES: 1. Left ankle comminuted pilon fracture. 2. Left ankle lateral malleolus fracture.  POSTOPERATIVE DIAGNOSES: 1. Left ankle comminuted pilon fracture. 2. Left ankle lateral malleolus fracture.  PROCEDURES: 1. Open reduction and internal fixation of left distal tibia pilon     fracture. 2. Open reduction and internal fixation of left ankle lateral     malleolus fracture.  SURGEON:  Lind Guest. Ninfa Linden, M.D.  ASSISTANT:  Erskine Emery, PA-C.  IMPLANTS: 1. Biomet one third semitubular plate on the left ankle lateral     malleolus/fibula. 2. Biomet distal tibia periarticular locking plate.  ANESTHESIA: 1. Left lower extremity regional block. 2. General.  TOURNIQUET TIME:  2 hours.  ANTIBIOTICS:  900 mg of IV clindamycin.  COMPLICATIONS:  None.  INDICATIONS:  Angel Hale is a 46 year old female, who just over 2 weeks ago fell off her horse accidentally injuring her left ankle.  She sustained a crushing injury to the left ankle and it was a highly- comminuted distal tibia/pilon fracture and fibula fracture.  We had to temporize her with external fixation on the day of her injury.  We have waited now for definitive fixation due to the highly-comminuted pilon fracture in fact that we need soft tissue swelling calm down with fracture blisters that she developed.  The risks and benefits of the surgery were explained to her in detail and she does wish to proceed at this point.  She is presenting for definitive fixation of the fracture after removing external fixation.  PROCEDURE DESCRIPTION:  After informed consent was obtained,  appropriate left ankle was marked.  Anesthesia obtained with regional anesthesia. She was brought to the operating room, placed supine on the operating table.  General anesthesia was then obtained.  A nonsterile tourniquet was placed around her upper left thigh and her left leg was prepped and draped with Betadine scrub and paint including prepping the external fixation into the field.  We first had the tourniquet inflated to 300 mm of pressure.  I made a lateral incision almost posterolateral to expose the fibula.  We were able to expose the fibula and bring it out to length.  We then bridged it with a one-third semitubular plate, which was a 6-hole plate with three bicortical screws proximally and three distally.  We then made an anterior incision over the tibia and carried this proximally and distally.  We found a highly and heavily-comminuted intra-articular fracture of the distal tibia and pilon.  It took some time to reduce the fracture fragments in place, temporary holding them with K-wires.  We then chose periarticular anterolateral plate that we secured to the distal tibia with bicortical screws proximally and the combination of bicortical screws and distal screws distally.  We did put a separate screw and the anterolateral piece to reduce the ankle mortise and ankle joint  completely.  Once we had done this, we did remove the external fixation.  We curetted out all external fixation pin sites.  We then irrigated both soft tissue wounds with normal saline solution and closed the deep tissue over the plates with 0 Vicryl followed by 2-0 Vicryl in the subcutaneous tissue and interrupted nylon on the skin. Xeroform and well-padded sterile dressing were applied and we did place her in a plaster splint.  She was awakened, extubated and taken to the recovery room in stable condition.  All final counts were correct. There were no complications noted.  Of note, Erskine Emery,  PA-C, assisted in the entire case.  His assistance was crucial for facilitating all aspects of this case.     Lind Guest. Ninfa Linden, M.D.   ______________________________ Lind Guest. Ninfa Linden, M.D.    CYB/MEDQ  D:  03/03/2017  T:  03/03/2017  Job:  [id number]

## 2017-03-03 NOTE — Anesthesia Procedure Notes (Signed)
Anesthesia Regional Block: Popliteal block   Pre-Anesthetic Checklist: ,, timeout performed, Correct Patient, Correct Site, Correct Laterality, Correct Procedure, Correct Position, site marked, Risks and benefits discussed,  Surgical consent,  Pre-op evaluation,  At surgeon's request and post-op pain management  Laterality: Left  Prep: chloraprep       Needles:  Injection technique: Single-shot  Needle Type: Echogenic Needle     Needle Length: 9cm      Additional Needles:   Procedures: ultrasound guided,,,,,,,,  Narrative:  Start time: 03/03/2017 3:04 PM End time: 03/03/2017 3:11 PM Injection made incrementally with aspirations every 5 mL.  Performed by: Personally  Anesthesiologist: Tiara Maultsby  Additional Notes: Patient tolerated the procedure well without complications

## 2017-03-03 NOTE — Op Note (Deleted)
  The note originally documented on this encounter has been moved the the encounter in which it belongs.  

## 2017-03-03 NOTE — Transfer of Care (Signed)
Immediate Anesthesia Transfer of Care Note  Patient: Angel Hale  Procedure(s) Performed: Procedure(s): OPEN REDUCTION INTERNAL FIXATION (ORIF) LEFT PILON FRACTURE AND LEFT LATERAL MALLEOLUS (Left)  Patient Location: PACU  Anesthesia Type:GA combined with regional for post-op pain  Level of Consciousness: drowsy  Airway & Oxygen Therapy: Patient Spontanous Breathing and Patient connected to face mask oxygen  Post-op Assessment: Report given to RN and Post -op Vital signs reviewed and stable  Post vital signs: Reviewed and stable  Last Vitals:  Vitals:   03/03/17 1353 03/03/17 1916  BP: 106/60   Pulse: 84   Resp: 18   Temp: 36.8 C (!) (P) 36.1 C    Last Pain:  Vitals:   03/03/17 1353  TempSrc: Oral  PainSc: 4          Complications: No apparent anesthesia complications

## 2017-03-03 NOTE — Anesthesia Procedure Notes (Addendum)
Anesthesia Procedure Image    

## 2017-03-03 NOTE — Anesthesia Postprocedure Evaluation (Signed)
Anesthesia Post Note  Patient: Angel Hale  Procedure(s) Performed: Procedure(s) (LRB): OPEN REDUCTION INTERNAL FIXATION (ORIF) LEFT PILON FRACTURE AND LEFT LATERAL MALLEOLUS (Left)     Patient location during evaluation: PACU Anesthesia Type: General Level of consciousness: awake and alert Pain management: pain level controlled Vital Signs Assessment: post-procedure vital signs reviewed and stable Respiratory status: spontaneous breathing, nonlabored ventilation, respiratory function stable and patient connected to nasal cannula oxygen Cardiovascular status: blood pressure returned to baseline and stable Postop Assessment: no signs of nausea or vomiting Anesthetic complications: no    Last Vitals:  Vitals:   03/03/17 2030 03/03/17 2034  BP: 129/80   Pulse: 91   Resp: 18   Temp:  (!) 36.2 C    Last Pain:  Vitals:   03/03/17 2125  TempSrc:   PainSc: 10-Worst pain ever                 Angel Hale DAVID

## 2017-03-03 NOTE — Brief Op Note (Signed)
03/03/2017  6:57 PM  PATIENT:  Angel Hale  46 y.o. female  PRE-OPERATIVE DIAGNOSIS:  left pilon fracture  POST-OPERATIVE DIAGNOSIS:  left pilon fracture  PROCEDURE:  Procedure(s): OPEN REDUCTION INTERNAL FIXATION (ORIF) LEFT PILON FRACTURE AND LEFT LATERAL MALLEOLUS (Left)  SURGEON:  Surgeon(s) and Role:    Mcarthur Rossetti, MD - Primary  PHYSICIAN ASSISTANT: Benita Stabile, PA-C  ANESTHESIA:   regional and general  EBL:  Total I/O In: 1000 [I.V.:1000] Out: 120 [Blood:120]  COUNTS:  YES  TOURNIQUET:   Total Tourniquet Time Documented: Thigh (Left) - 119 minutes Total: Thigh (Left) - 119 minutes   DICTATION: .Other Dictation: Dictation Number [id number]  PLAN OF CARE: Admit to inpatient   PATIENT DISPOSITION:  PACU - hemodynamically stable.   Delay start of Pharmacological VTE agent (>24hrs) due to surgical blood loss or risk of bleeding: no

## 2017-03-04 ENCOUNTER — Encounter (HOSPITAL_COMMUNITY): Payer: Self-pay | Admitting: General Practice

## 2017-03-04 MED ORDER — KETOROLAC TROMETHAMINE 30 MG/ML IJ SOLN
30.0000 mg | Freq: Once | INTRAMUSCULAR | Status: AC
  Administered 2017-03-04: 30 mg via INTRAVENOUS
  Filled 2017-03-04: qty 1

## 2017-03-04 MED ORDER — KETOROLAC TROMETHAMINE 30 MG/ML IJ SOLN
INTRAMUSCULAR | Status: AC
  Filled 2017-03-04: qty 1

## 2017-03-04 MED ORDER — OXYCODONE HCL 5 MG PO TABS
5.0000 mg | ORAL_TABLET | ORAL | Status: DC | PRN
  Administered 2017-03-04 – 2017-03-05 (×8): 10 mg via ORAL
  Filled 2017-03-04 (×8): qty 2

## 2017-03-04 NOTE — Progress Notes (Signed)
Subjective: 1 Day Post-Op Procedure(s) (LRB): OPEN REDUCTION INTERNAL FIXATION (ORIF) LEFT PILON FRACTURE AND LEFT LATERAL MALLEOLUS (Left) Patient reports pain as severe.    Objective: Vital signs in last 24 hours: Temp:  [97 F (36.1 C)-98.9 F (37.2 C)] 98.1 F (36.7 C) (07/11 0400) Pulse Rate:  [74-102] 74 (07/11 0400) Resp:  [9-19] 16 (07/11 0400) BP: (106-129)/(60-82) 115/76 (07/11 0400) SpO2:  [93 %-100 %] 100 % (07/11 0400) Weight:  [120 lb (54.4 kg)] 120 lb (54.4 kg) (07/10 1353)  Intake/Output from previous day: 07/10 0701 - 07/11 0700 In: 1062.6 [P.O.:522; I.V.:2141.3] Out: 620 [Urine:500; Blood:120] Intake/Output this shift: Total I/O In: 60 [P.O.:60] Out: -   No results for input(s): HGB in the last 72 hours. No results for input(s): WBC, RBC, HCT, PLT in the last 72 hours. No results for input(s): NA, K, CL, CO2, BUN, CREATININE, GLUCOSE, CALCIUM in the last 72 hours. No results for input(s): LABPT, INR in the last 72 hours.  Incision: dressing C/D/I Toes well-perfused. Splint intact   Assessment/Plan: 1 Day Post-Op Procedure(s) (LRB): OPEN REDUCTION INTERNAL FIXATION (ORIF) LEFT PILON FRACTURE AND LEFT LATERAL MALLEOLUS (Left) Plan for discharge tomorrow  Needs to stay today due to the severity of her pain.  Mcarthur Rossetti 03/04/2017, 7:38 AM

## 2017-03-04 NOTE — Evaluation (Signed)
Physical Therapy Evaluation Patient Details Name: Angel Hale MRN: [id number] DOB: [birth year] Today's Date: 03/04/2017   History of Present Illness  46 yo female who sustained a left ankle injury over 2 weeks ago after a fall off of her horse.  Was found to have a severely displaced left ankle pilon fracture and was placed in an external fixator the day of her injury to improve the overall alignment and to allow soft-tissue swelling to lessen. Returns now for ORIF.   Clinical Impression  Patient evaluated by Physical Therapy with no further acute PT needs identified. All education has been completed and the patient has no further questions. Pt mod I with mobility with AD, tolerated standing and hopping very well and was able to keep LLE NWB while up. See below for any follow-up Physical Therapy or equipment needs. PT is signing off. Thank you for this referral.     Follow Up Recommendations Outpatient PT when appropriate    Equipment Recommendations  None recommended by PT    Recommendations for Other Services       Precautions / Restrictions Precautions Precautions: Fall Restrictions Weight Bearing Restrictions: Yes LLE Weight Bearing: Non weight bearing      Mobility  Bed Mobility Overal bed mobility: Modified Independent Bed Mobility: Supine to Sit;Sit to Supine     Supine to sit: Modified independent (Device/Increase time) Sit to supine: Modified independent (Device/Increase time)   General bed mobility comments: pt able to get in and out of bed without assist as well as elevating LLE  Transfers Overall transfer level: Modified independent Equipment used: Rolling walker (2 wheeled) Transfers: Sit to/from Stand Sit to Stand: Modified independent (Device/Increase time)         General transfer comment: stood safely to RW  Ambulation/Gait Ambulation/Gait assistance: Modified independent (Device/Increase time) Ambulation Distance (Feet): 20 Feet Assistive  device: Rolling walker (2 wheeled) Gait Pattern/deviations:  (hopping)     General Gait Details: pt very fearful of standing but was much better than she thought it would be. Was able to hop 20' mod I with RW  Stairs            Wheelchair Mobility    Modified Rankin (Stroke Patients Only)       Balance Overall balance assessment: Needs assistance Sitting-balance support: No upper extremity supported;Feet supported Sitting balance-Leahy Scale: Good     Standing balance support: Single extremity supported Standing balance-Leahy Scale: Fair Standing balance comment: limited due to NWB LLE                             Pertinent Vitals/Pain Pain Assessment: Faces Faces Pain Scale: Hurts even more Pain Location: L foot Pain Descriptors / Indicators: Discomfort;Grimacing;Throbbing Pain Intervention(s): Limited activity within patient's tolerance;Monitored during session;Premedicated before session    [place] expects to be discharged to:: Private residence Living Arrangements: Spouse/significant other Available Help at Discharge: Family;Available PRN/intermittently Type of Home: House Home Access: Stairs to enter Entrance Stairs-Rails: None Entrance Stairs-Number of Steps: 2 Home Layout: Two level;Bed/bath upstairs Home Equipment: Shower seat - built in Additional Comments: 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 Very active    Prior Function Level of Independence: Independent         Comments: ADLs, IADLs, driving; has horses      Hand Dominance   Dominant Hand: Right    Extremity/Trunk Assessment   Upper Extremity Assessment Upper Extremity Assessment: Overall WFL for tasks  assessed    Lower Extremity Assessment Lower Extremity Assessment: LLE deficits/detail LLE Deficits / Details: able to move all 5 toes but decreased sensation bottom of L great toe. Hip and knee WFL    Cervical / Trunk Assessment Cervical / Trunk  Assessment: Normal  Communication   Communication: No difficulties  Cognition Arousal/Alertness: Awake/alert Behavior During Therapy: WFL for tasks assessed/performed Overall Cognitive Status: Within Functional Limits for tasks assessed                                        General Comments General comments (skin integrity, edema, etc.): discussed activity level at home    Exercises General Exercises - Lower Extremity Long Arc Quad: AROM;Left;10 reps;Seated Straight Leg Raises: AROM;Left;10 reps;Supine   Assessment/Plan    PT Assessment All further PT needs can be met in the next venue of care  PT Problem List Decreased strength;Decreased range of motion;Decreased activity tolerance;Decreased balance;Decreased mobility;Decreased knowledge of use of DME;Pain       PT Treatment Interventions DME instruction;Gait training;Functional mobility training;Therapeutic activities;Therapeutic exercise;Balance training;Patient/family education    PT Goals (Current goals can be found in the Care Plan section)  Acute Rehab PT Goals Patient Stated Goal: return to farm PT Goal Formulation: All assessment and education complete, DC therapy    Frequency     Barriers to discharge        Co-evaluation               AM-PAC PT "6 Clicks" Daily Activity  Outcome Measure Difficulty turning over in bed (including adjusting bedclothes, sheets and blankets)?: None Difficulty moving from lying on back to sitting on the side of the bed? : None Difficulty sitting down on and standing up from a chair with arms (e.g., wheelchair, bedside commode, etc,.)?: None Help needed moving to and from a bed to chair (including a wheelchair)?: None Help needed walking in hospital room?: None Help needed climbing 3-5 steps with a railing? : None 6 Click Score: 24    End of Session Equipment Utilized During Treatment: Gait belt Activity Tolerance: Patient tolerated treatment well Patient  left: in bed;with call bell/phone within reach;with family/visitor present Nurse Communication: Mobility status PT Visit Diagnosis: Pain;Difficulty in walking, not elsewhere classified (R26.2) Pain - Right/Left: Left Pain - part of body: Ankle and joints of foot    Time: 0762-2633 PT Time Calculation (min) (ACUTE ONLY): 21 min   Charges:   PT Evaluation $PT Eval Low Complexity: 1 Procedure     PT G Codes:        Leighton Roach, PT  Acute Rehab Services  [employer] 03/04/2017, 4:26 PM

## 2017-03-05 MED ORDER — VITAMIN C 500 MG PO TABS: 500.0000 mg | ORAL_TABLET | Freq: Every day | ORAL | 0 refills | Status: DC

## 2017-03-05 MED ORDER — ASPIRIN 325 MG PO TABS: 325.0000 mg | ORAL_TABLET | Freq: Every day | ORAL | 0 refills | Status: DC

## 2017-03-05 MED ORDER — METHOCARBAMOL 500 MG PO TABS: 500.0000 mg | ORAL_TABLET | Freq: Four times a day (QID) | ORAL | 1 refills | Status: DC | PRN

## 2017-03-05 MED ORDER — OXYCODONE-ACETAMINOPHEN 5-325 MG PO TABS: 1.0000 | ORAL_TABLET | ORAL | 0 refills | Status: DC | PRN

## 2017-03-05 NOTE — Discharge Summary (Signed)
Patient ID: Angel Hale MRN: [id number] DOB/AGE: 04-07-1971 46 y.o.  Admit date: 03/03/2017 Discharge date: 03/05/2017  Admission Diagnoses:  Principal Problem:   Closed left pilon fracture, initial encounter   Discharge Diagnoses:  Same  Past Medical History:  Diagnosis Date  . Medical history non-contributory     Surgeries: Procedure(s): OPEN REDUCTION INTERNAL FIXATION (ORIF) LEFT PILON FRACTURE AND LEFT LATERAL MALLEOLUS on 03/03/2017   Consultants:   Discharged Condition: Improved  Hospital Course: Angel Hale is an 46 y.o. female who was admitted 03/03/2017 for operative treatment ofClosed left pilon fracture, initial encounter. Patient has severe unremitting pain that affects sleep, daily activities, and work/hobbies. After pre-op clearance the patient was taken to the operating room on 03/03/2017 and underwent  Procedure(s): OPEN REDUCTION INTERNAL FIXATION (ORIF) LEFT PILON FRACTURE AND LEFT LATERAL MALLEOLUS.    Patient was given perioperative antibiotics: Anti-infectives    Start     Dose/Rate Route Frequency Ordered Stop   03/03/17 1500  clindamycin (CLEOCIN) IVPB 900 mg     900 mg 100 mL/hr over 30 Minutes Intravenous On call to O.R. 03/03/17 1327 03/03/17 1602   03/03/17 1338  clindamycin (CLEOCIN) 900 MG/50ML IVPB    Comments:  [name]   : cabinet override      03/03/17 1338 03/03/17 1602       Patient was given sequential compression devices, early ambulation, and chemoprophylaxis to prevent DVT.  Patient benefited maximally from hospital stay and there were no complications.    Recent vital signs: Patient Vitals for the past 24 hrs:  BP Temp Temp src Pulse Resp SpO2  03/05/17 0620 120/79 99 F (37.2 C) Oral 89 15 98 %  03/04/17 2210 120/77 99.1 F (37.3 C) Oral 80 16 100 %  03/04/17 1052 122/66 97.8 F (36.6 C) Oral 76 16 100 %     Recent laboratory studies: No results for input(s): WBC, HGB, HCT, PLT, NA, K, CL, CO2, BUN, CREATININE,  GLUCOSE, INR, CALCIUM in the last 72 hours.  Invalid input(s): PT, 2   Discharge Medications:   Allergies as of 03/05/2017      Reactions   Penicillins Nausea And Vomiting   Has patient had a PCN reaction causing immediate rash, facial/tongue/throat swelling, SOB or lightheadedness with hypotension: No Has patient had a PCN reaction causing severe rash involving mucus membranes or skin necrosis: No Has patient had a PCN reaction that required hospitalization: No Has patient had a PCN reaction occurring within the last 10 years: No If all of the above answers are "NO", then may proceed with Cephalosporin use.      Medication List    STOP taking these medications   acetaminophen-codeine 300-30 MG tablet Commonly known as:  TYLENOL #3   ibuprofen 200 MG tablet Commonly known as:  ADVIL,MOTRIN   tiZANidine 2 MG tablet Commonly known as:  ZANAFLEX     TAKE these medications   aspirin 325 MG tablet Take 1 tablet (325 mg total) by mouth daily.   methocarbamol 500 MG tablet Commonly known as:  ROBAXIN Take 1 tablet (500 mg total) by mouth every 6 (six) hours as needed for muscle spasms.   oxyCODONE-acetaminophen 5-325 MG tablet Commonly known as:  ROXICET Take 1-2 tablets by mouth every 4 (four) hours as needed. What changed:  how much to take  reasons to take this       Diagnostic Studies: Dg Chest 2 View  Result Date: 02/15/2017 CLINICAL DATA:  Preop left ankle fracture EXAM:  CHEST  2 VIEW COMPARISON:  None. FINDINGS: The lungs are clear. The pulmonary vasculature is normal. Heart size is normal. Hilar and mediastinal contours are unremarkable. There is no pleural effusion. IMPRESSION: No active cardiopulmonary disease. Electronically Signed   By: Andreas Newport M.D.   On: 02/15/2017 23:58   Dg Ankle 2 Views Left  Result Date: 02/16/2017 CLINICAL DATA:  External fixation EXAM: LEFT ANKLE - 2 VIEW; DG C-ARM 61-120 MIN COMPARISON:  02/15/2017 FINDINGS: Total  fluoroscopy time was 49 seconds. Two low resolution intraoperative spot views of the left ankle are submitted. The images demonstrate a comminuted, displaced and overriding distal fibular fracture with decreased angulation. Severely comminuted did fracture of the distal tibia with decreased angulation compared to prior. Posteriorly displaced fracture fragment, likely from the posterior malleolus. IMPRESSION: Intraoperative fluoroscopic assistance provided during external fixation of left ankle fractures Electronically Signed   By: Donavan Foil M.D.   On: 02/16/2017 03:53   Dg Ankle 2 Views Left  Result Date: 02/15/2017 CLINICAL DATA:  [place] off of a horse and injured the left ankle EXAM: LEFT ANKLE - 2 VIEW COMPARISON:  None. FINDINGS: Comminuted fracture involving the distal diaphysis of the fibula. There is moderate lateral and posterior angulation of the distal fracture fragment. Superiorly displaced 9 mm fracture fragment at the fibular fracture site with approximately 15 mm of overriding. Severely comminuted intra-articular fracture of the distal tibia with distraction of fracture fragments and lateral and posterior angulation of the distal fracture fragments. Widened and disrupted superolateral mortise. IMPRESSION: 1. Comminuted, angulated and overriding distal fibular fracture 2. Severely comminuted, angulated intra-articular fracture of the distal tibia. Electronically Signed   By: Donavan Foil M.D.   On: 02/15/2017 22:48   Dg Ankle Complete Left  Result Date: 03/03/2017 CLINICAL DATA:  Elective surgery. Open reduction internal fixation left pilon and lateral malleolus fracture. EXAM: DG C-ARM 61-120 MIN; LEFT ANKLE COMPLETE - 3+ VIEW COMPARISON:  Radiographs and CT 02/15/2017 and 02/16/2017 FINDINGS: Three fluoroscopic spot images in the operating room demonstrate lateral plate and screw fixation of distal fibular fracture. Anterior plate and multi screw fixation of comminuted distal tibia fracture.  Fractures are in improved alignment compared to preoperative exam. Fluoroscopy time 1 minutes 36 seconds. IMPRESSION: Post procedural fluoroscopic spot views post ORIF comminuted distal tibia and fibular fractures. Electronically Signed   By: Jeb Levering M.D.   On: 03/03/2017 22:11   Ct Ankle Left Wo Contrast  Result Date: 02/17/2017 CLINICAL DATA:  Fractures of the distal tibia and fibula after falling from a horse yesterday. EXAM: CT OF THE LEFT LOWER LEG WITHOUT CONTRAST TECHNIQUE: Multidetector CT imaging of the left lower leg was performed according to the standard protocol. Multiplanar CT image reconstructions were also generated. COMPARISON:  Radiographs dated 02/16/2017 FINDINGS: Bones/Joint/Cartilage There is a comminuted fracture of the distal fibular shaft with approximately 6.4 mm of overriding of the fracture fragments with medial displacement of the distal fragment. There is a fragment of cortex which is rotated 90 degrees. There is a severely comminuted impaction fracture of the distal tibia. Impaction is at least 5 mm. The articular surface of the distal tibia is superiorly comminuted with the 16 mm gap in the articular surface. The fracture extends approximately 6 cm proximal to the tibiotalar joint. The fracture involves the medial and posterior malleoli of the distal tibia. There is a fragment which is displaced distally lying posterior to the distal lateral malleolus. There is no dislocation. IMPRESSION: 1. Severely comminuted  fracture of the distal tibia as described. 2. Displaced overriding fracture of the distal fibula shaft. Electronically Signed   By: Lorriane Shire M.D.   On: 02/17/2017 08:34   Dg C-arm 1-60 Min  Result Date: 02/16/2017 CLINICAL DATA:  External fixation EXAM: LEFT ANKLE - 2 VIEW; DG C-ARM 61-120 MIN COMPARISON:  02/15/2017 FINDINGS: Total fluoroscopy time was 49 seconds. Two low resolution intraoperative spot views of the left ankle are submitted. The images  demonstrate a comminuted, displaced and overriding distal fibular fracture with decreased angulation. Severely comminuted did fracture of the distal tibia with decreased angulation compared to prior. Posteriorly displaced fracture fragment, likely from the posterior malleolus. IMPRESSION: Intraoperative fluoroscopic assistance provided during external fixation of left ankle fractures Electronically Signed   By: Donavan Foil M.D.   On: 02/16/2017 03:53   Dg C-arm 61-120 Min  Result Date: 03/03/2017 CLINICAL DATA:  Elective surgery. Open reduction internal fixation left pilon and lateral malleolus fracture. EXAM: DG C-ARM 61-120 MIN; LEFT ANKLE COMPLETE - 3+ VIEW COMPARISON:  Radiographs and CT 02/15/2017 and 02/16/2017 FINDINGS: Three fluoroscopic spot images in the operating room demonstrate lateral plate and screw fixation of distal fibular fracture. Anterior plate and multi screw fixation of comminuted distal tibia fracture. Fractures are in improved alignment compared to preoperative exam. Fluoroscopy time 1 minutes 36 seconds. IMPRESSION: Post procedural fluoroscopic spot views post ORIF comminuted distal tibia and fibular fractures. Electronically Signed   By: Jeb Levering M.D.   On: 03/03/2017 22:11    Disposition: 01-Home or Self Care  Discharge Instructions    Elevate operative extremity    Complete by:  As directed    Encourage patient to wiggle toes often   Non weight bearing    Complete by:  As directed    Non weight bearing left leg      Follow-up Information    Mcarthur Rossetti, MD. Call in 2 week(s).   Specialty:  Orthopedic Surgery Contact information: [place] [address] [phone]            Signed: Erskine Emery 03/05/2017, 8:40 AM

## 2017-03-05 NOTE — Progress Notes (Signed)
Subjective: 2 Days Post-Op Procedure(s) (LRB): OPEN REDUCTION INTERNAL FIXATION (ORIF) LEFT PILON FRACTURE AND LEFT LATERAL MALLEOLUS (Left) Patient reports pain as moderate to severe.  No chest pain or SOB.   Objective: Vital signs in last 24 hours: Temp:  [97.8 F (36.6 C)-99.1 F (37.3 C)] 99 F (37.2 C) (07/12 0620) Pulse Rate:  [76-89] 89 (07/12 0620) Resp:  [15-16] 15 (07/12 0620) BP: (120-122)/(66-79) 120/79 (07/12 0620) SpO2:  [98 %-100 %] 98 % (07/12 0620)  Intake/Output from previous day: 07/11 0701 - 07/12 0700 In: 1372.5 [P.O.:360; I.V.:1012.5] Out: -  Intake/Output this shift: No intake/output data recorded.  No results for input(s): HGB in the last 72 hours. No results for input(s): WBC, RBC, HCT, PLT in the last 72 hours. No results for input(s): NA, K, CL, CO2, BUN, CREATININE, GLUCOSE, CALCIUM in the last 72 hours. No results for input(s): LABPT, INR in the last 72 hours.  Left leg: Neurovascular intact Sensation intact distally Incision: dressing C/D/I Compartment soft  Assessment/Plan: 2 Days Post-Op Procedure(s) (LRB): OPEN REDUCTION INTERNAL FIXATION (ORIF) LEFT PILON FRACTURE AND LEFT LATERAL MALLEOLUS (Left) Discharge home with home health  [place] 03/05/2017, 8:44 AM

## 2017-03-05 NOTE — Progress Notes (Signed)
Angel Hale to be D/C'd  per MD order. Discussed with the patient and all questions fully answered.  VSS, Skin clean, dry and intact without evidence of skin break down, no evidence of skin tears noted.  IV catheter discontinued intact. Site without signs and symptoms of complications. Dressing and pressure applied.  An After Visit Summary was printed and given to the patient. Patient received prescription.  D/c education completed with patient/family including follow up instructions, medication list, d/c activities limitations if indicated, with other d/c instructions as indicated by MD - patient able to verbalize understanding, all questions fully answered.   Patient instructed to return to ED, call 911, or call MD for any changes in condition.   Patient to be escorted via [place], and D/C home via private auto.

## 2017-03-05 NOTE — Discharge Instructions (Signed)
Keep splint clean dry and intact. Non weight bearing left leg. Elevate leg above heart  And wiggle toes often.

## 2017-03-05 NOTE — Care Management Note (Signed)
Case Management Note  Patient Details  Name: Angel Hale MRN: [id number] Date of Birth: Jocilyn 14, 1972  Subjective/Objective:                    Action/Plan: Pt discharging home with self care. No orders for [facility] services but PA mentioned [place] in his note. Patient has no insurance and would not qualify for charity services.  CM inquired about a PCP and patient states she will find a PCP on her own.  CM offered coupons to assist with the cost of her d/c medications. Pt uses a small pharmacy in [place] and the coupons will not work at [employer]. She states she is able to cover the cost of the medications out of pocket.  Husband providing transportation home.  Expected Discharge Date:  03/05/17               Expected Discharge Plan:  Home/Self Care  In-House Referral:     Discharge planning Services     Post Acute Care Choice:    Choice offered to:     DME Arranged:    DME Agency:     HH Arranged:    HH Agency:     Status of Service:  Completed, signed off  If discussed at [employer] of Stay Meetings, dates discussed:    Additional Comments:  Pollie Friar, RN 03/05/2017, 2:39 PM

## 2017-03-11 ENCOUNTER — Telehealth (INDEPENDENT_AMBULATORY_CARE_PROVIDER_SITE_OTHER): Payer: Self-pay | Admitting: Orthopaedic Surgery

## 2017-03-11 NOTE — Telephone Encounter (Signed)
Please advise- patient is s/p OPEN REDUCTION INTERNAL FIXATION (ORIF) LEFT PILON FRACTURE AND LEFT LATERAL MALLEOLUS (Left) 03/03/2017

## 2017-03-11 NOTE — Telephone Encounter (Signed)
She needs to still take aspirin until her follow-up with [place].

## 2017-03-11 NOTE — Telephone Encounter (Signed)
IC patient and advised.  

## 2017-03-11 NOTE — Telephone Encounter (Signed)
Patient called asked if she should continue to take aspirin? The number to contact patient is [phone]

## 2017-03-16 ENCOUNTER — Telehealth (INDEPENDENT_AMBULATORY_CARE_PROVIDER_SITE_OTHER): Payer: Self-pay | Admitting: Orthopaedic Surgery

## 2017-03-16 MED ORDER — OXYCODONE-ACETAMINOPHEN 5-325 MG PO TABS: 1.0000 | ORAL_TABLET | Freq: Four times a day (QID) | ORAL | 0 refills | Status: DC | PRN

## 2017-03-16 NOTE — Telephone Encounter (Signed)
Please advise 

## 2017-03-16 NOTE — Telephone Encounter (Signed)
PT REQUESTS REFILL OF PAIN MEDS PLEASE. [phone]

## 2017-03-16 NOTE — Telephone Encounter (Signed)
Can come and pick up a prescription.

## 2017-03-16 NOTE — Telephone Encounter (Signed)
Patient aware Rx ready at front desk  

## 2017-03-19 ENCOUNTER — Ambulatory Visit (INDEPENDENT_AMBULATORY_CARE_PROVIDER_SITE_OTHER): Payer: Self-pay | Admitting: Orthopaedic Surgery

## 2017-03-19 ENCOUNTER — Ambulatory Visit (INDEPENDENT_AMBULATORY_CARE_PROVIDER_SITE_OTHER): Payer: Self-pay

## 2017-03-19 DIAGNOSIS — M25572 Pain in left ankle and joints of left foot: Secondary | ICD-10-CM

## 2017-03-19 DIAGNOSIS — S82872A Displaced pilon fracture of left tibia, initial encounter for closed fracture: Secondary | ICD-10-CM

## 2017-03-19 NOTE — Progress Notes (Signed)
The patient is now 2 weeks status post a reduction internal fixation of a comminuted left distal tibia and fibula fracture. This was a pilon fracture. She is here for first visit. She's been in a splint as well. She's been on aspirin twice a day.  Out of sling all the suture lines look good removed the sutures and place Steri-Strips. The fracture blister sites are also healing nicely. Her foot is well perfused. She has limited range of motion of the ankle. Her calf is soft. 3 views of the ankle are obtained and show intact hardware. The fracture is too early to shows signs of healing but anatomically the joint surface looks good. I'm concerned about the length of screws as they come out of the posterior tibia but otherwise everything else looks fine.  This point I will let her work on range of motion of her left ankle but no weightbearing. We'll put her in a Cam Walker that she does wear when she is up. All questions were encouraged and answered. We'll see her back in 4 weeks with a repeat 3 views of her left ankle.

## 2017-03-25 ENCOUNTER — Telehealth (INDEPENDENT_AMBULATORY_CARE_PROVIDER_SITE_OTHER): Payer: Self-pay | Admitting: Orthopaedic Surgery

## 2017-03-25 ENCOUNTER — Other Ambulatory Visit (INDEPENDENT_AMBULATORY_CARE_PROVIDER_SITE_OTHER): Payer: Self-pay | Admitting: Orthopaedic Surgery

## 2017-03-25 MED ORDER — OXYCODONE-ACETAMINOPHEN 5-325 MG PO TABS: 1.0000 | ORAL_TABLET | Freq: Four times a day (QID) | ORAL | 0 refills | Status: DC | PRN

## 2017-03-25 NOTE — Telephone Encounter (Signed)
PT REQUEST REFILL OF PAIN MEDS  [phone]

## 2017-03-25 NOTE — Telephone Encounter (Signed)
Please advise 

## 2017-03-25 NOTE — Telephone Encounter (Signed)
She can come and pick up a prescription 

## 2017-03-26 NOTE — Telephone Encounter (Signed)
Patient aware Rx ready at front desk  

## 2017-04-16 ENCOUNTER — Ambulatory Visit (INDEPENDENT_AMBULATORY_CARE_PROVIDER_SITE_OTHER): Payer: Self-pay

## 2017-04-16 ENCOUNTER — Ambulatory Visit (INDEPENDENT_AMBULATORY_CARE_PROVIDER_SITE_OTHER): Payer: Self-pay | Admitting: Orthopaedic Surgery

## 2017-04-16 DIAGNOSIS — S82872D Displaced pilon fracture of left tibia, subsequent encounter for closed fracture with routine healing: Secondary | ICD-10-CM

## 2017-04-16 NOTE — Progress Notes (Signed)
The patient is following up at 6 weeks status post open reduction-internal fixation of a comminuted left pilon fracture. She's been in a cam walking boot with only touchdown weightbearing. She is been doing well and is only taking some Advil for pain but is not taking anything in about a week.  On exam all wounds look like they're healing up nicely now her ankle. Her left ankle is stiff to be expected but she is doing well with sensation and perfusion. Approve this and general range of motion and she tolerated well.  3 views of the ankle obtained and show the hardware is intact and there is been some interval healing as well. Of note on clinical exam there is one screw that is quite prominent and concerned that as her swelling goes down that there is going to be soft tissue compromise from this. I showed her that as well and she understands. There is a screw that I would certainly take out once I feel that she is healed the fracture.  She'll now increase her weightbearing to 50% weightbearing in the cam walker. She'll work on range of motion the ankle. I like see her back in 4 weeks for repeat 3 views of her left ankle.

## 2017-04-28 ENCOUNTER — Telehealth (INDEPENDENT_AMBULATORY_CARE_PROVIDER_SITE_OTHER): Payer: Self-pay | Admitting: Orthopaedic Surgery

## 2017-04-28 NOTE — Telephone Encounter (Signed)
Patient called asked when should she call to set up surgery? Patient advised she would like to get surgery scheduled before her appointment with Dr. Ninfa Linden on 05/21/17. Patient said she is still having some numbness in her great toe. The number to contact patient is

## 2017-04-28 NOTE — Telephone Encounter (Signed)
Please advise 

## 2017-04-29 NOTE — Telephone Encounter (Signed)
Please call her and let her know that we will work on scheduling a surgery to remove that prominent screw and that our surgery scheduler give her a call sometime in the next week or so.

## 2017-04-29 NOTE — Telephone Encounter (Signed)
Patient aware of below message

## 2017-05-12 ENCOUNTER — Other Ambulatory Visit: Payer: Self-pay | Admitting: General Surgery

## 2017-05-12 ENCOUNTER — Ambulatory Visit (HOSPITAL_COMMUNITY)
Admission: RE | Admit: 2017-05-12 | Discharge: 2017-05-12 | Disposition: A | Discharge status: Home / Self Care | Payer: Self-pay | Source: Ambulatory Visit | Attending: General Surgery | Admitting: General Surgery

## 2017-05-12 ENCOUNTER — Ambulatory Visit (INDEPENDENT_AMBULATORY_CARE_PROVIDER_SITE_OTHER): Payer: Self-pay | Admitting: General Surgery

## 2017-05-12 ENCOUNTER — Encounter: Payer: Self-pay | Admitting: General Surgery

## 2017-05-12 VITALS — BP 109/80 | HR 98 | Temp 98.6°F | Resp 18 | Ht 65.0 in | Wt 120.0 lb

## 2017-05-12 DIAGNOSIS — IMO0002 Reserved for concepts with insufficient information to code with codable children: Secondary | ICD-10-CM

## 2017-05-12 DIAGNOSIS — N6310 Unspecified lump in the right breast, unspecified quadrant: Secondary | ICD-10-CM

## 2017-05-12 DIAGNOSIS — R229 Localized swelling, mass and lump, unspecified: Principal | ICD-10-CM

## 2017-05-12 DIAGNOSIS — C779 Secondary and unspecified malignant neoplasm of lymph node, unspecified: Secondary | ICD-10-CM | POA: Insufficient documentation

## 2017-05-12 DIAGNOSIS — C50411 Malignant neoplasm of upper-outer quadrant of right female breast: Secondary | ICD-10-CM | POA: Insufficient documentation

## 2017-05-12 MED ORDER — LIDOCAINE HCL (PF) 1 % IJ SOLN
INTRAMUSCULAR | Status: AC
  Administered 2017-05-12: 5 mL
  Filled 2017-05-12: qty 5

## 2017-05-12 MED ORDER — SODIUM BICARBONATE 4 % IV SOLN
INTRAVENOUS | Status: AC
  Administered 2017-05-12: 5 mL
  Filled 2017-05-12: qty 5

## 2017-05-12 MED ORDER — LIDOCAINE-EPINEPHRINE (PF) 1 %-1:200000 IJ SOLN
INTRAMUSCULAR | Status: AC
  Administered 2017-05-12: 30 mL
  Filled 2017-05-12: qty 30

## 2017-05-12 NOTE — Patient Instructions (Signed)
Mammogram and Ultrasound at [facility] today.

## 2017-05-12 NOTE — Progress Notes (Signed)
History and Physical [facility]  Chief Complaint: Right breast mass  Referring Physician:  Dr. Jomarie Longs   Angel Hale is an 46 y.o. female.  HPI: Patient was seen by Dr. Jomarie Longs for a right breast lesion that has been there for over 3 years. The patient reports that she first noticed a "cyst" under her inframammary fold about 2-3 years ago, and she attempted to "pop./ smash it".  These attempts left a skin tear under her breast she reports and this has never healed. She then noticed additional masses in her breast and under her armpit for over the past year. She has no drainage and has not had other treatments for the area. She has to wear a bandage under her bra due to the raw exposed nature of the lesion in the inframammary fold.  She denies any nipple discharge or other breast lesions.  She has avoided getting care for the breast mass because she thought it was just a cyst.  She recently had surgery on her left ankle after a trauma with a left pilon fracture.  She has been healing from this and doing well.  She denies fevers, chills, weight loss.  She has no breast cancer in her family.   Past Medical History:  Diagnosis Date  . Medical history non-contributory     Past Surgical History:  Procedure Laterality Date  . BUNIONECTOMY Left 2000  . EXTERNAL FIXATION LEG Left 02/16/2017   Procedure: EXTERNAL FIXATION LEG;  Surgeon: Mcarthur Rossetti, MD;  Location: [place];  Service: Orthopedics;  Laterality: Left;  . ORIF ANKLE FRACTURE Left 03/03/2017  . ORIF ANKLE FRACTURE Left 03/03/2017   Procedure: OPEN REDUCTION INTERNAL FIXATION (ORIF) LEFT PILON FRACTURE AND LEFT LATERAL MALLEOLUS;  Surgeon: Mcarthur Rossetti, MD;  Location: [place];  Service: Orthopedics;  Laterality: Left;  . OVARIAN CYST REMOVAL Left 1991  . WISDOM TOOTH EXTRACTION      Family History  Problem Relation Age of Onset  . Colon cancer Father     Social History:  reports that she quit  smoking about 2 months ago. Her smoking use included Cigarettes. She has a 10.00 pack-year smoking history. She has never used smokeless tobacco. She reports that she drinks alcohol. She reports that she does not use drugs.   Medications- Reports taking no medications.  Allergies as of 05/12/2017      Reactions   Penicillins Nausea And Vomiting   Has patient had a PCN reaction causing immediate rash, facial/tongue/throat swelling, SOB or lightheadedness with hypotension: No Has patient had a PCN reaction causing severe rash involving mucus membranes or skin necrosis: No Has patient had a PCN reaction that required hospitalization: No Has patient had a PCN reaction occurring within the last 10 years: No If all of the above answers are "NO", then may proceed with Cephalosporin use.      Medication List       Accurate as of 05/12/17 11:37 AM. Always use your most recent med list.          aspirin 325 MG tablet Take 1 tablet (325 mg total) by mouth daily.   methocarbamol 500 MG tablet Commonly known as:  ROBAXIN Take 1 tablet (500 mg total) by mouth every 6 (six) hours as needed for muscle spasms.   oxyCODONE-acetaminophen 5-325 MG tablet Commonly known as:  ROXICET Take 1-2 tablets by mouth every 6 (six) hours as needed.   vitamin C 500 MG tablet Commonly known as:  ASCORBIC  ACID Take 1 tablet (500 mg total) by mouth daily.       ROS:  A comprehensive review of systems was negative except for: Integument/breast: positive for breast lump, breast tenderness, skin color change and skin lesion(s)  Blood pressure 109/80, pulse 98, temperature 98.6 F (37 C), resp. rate 18, height 5\' 5"  (1.651 m), weight 120 lb (54.4 kg). Physical Exam  Constitutional: She is oriented to person, place, and time and well-developed, well-nourished, and in no distress.  HENT:  Head: Normocephalic and atraumatic.  Eyes: Pupils are equal, round, and reactive to light.  Cardiovascular: Normal rate  and regular rhythm.   Pulmonary/Chest: Effort normal and breath sounds normal. Right breast exhibits mass, skin change and tenderness. Right breast exhibits no inverted nipple and no nipple discharge. Left breast exhibits no inverted nipple, no mass, no skin change and no tenderness.  2cm wound at right inframammary fold, no erythema, drainage, peua de orange inferiorly on breast, 4cm upper outer quadrant mass, right axillary node, non mobile, left breast and axilla without masses   Abdominal: Soft. Bowel sounds are normal. She exhibits no distension. There is no tenderness.  Musculoskeletal: Normal range of motion.  Left leg with brace  Neurological: She is alert and oriented to person, place, and time.  Skin: Skin is warm and dry.  Psychiatric: Memory, affect and judgment normal.     Assessment/Plan: Angel Hale is a 46 yo with no prior mammogram or breast imaging that presents with a chronic wound and evidence of possible inflammatory breast cancer at the inframammary fold and additional mass in the upper outer quadrant. Discussed possibility of the breast cancer with patient and significant other.  Have also discussed chronic infection but this less likely. Given findings will get in for a mammogram and ultrasound with biopsy.  Have discussed with [facility] and will schedule for today.  -Mammogram and ultrasound -Follow up Thursday to discuss results    Virl Cagey 05/12/2017, 11:32 AM

## 2017-05-13 ENCOUNTER — Other Ambulatory Visit (HOSPITAL_COMMUNITY): Payer: Self-pay | Admitting: Adult Health

## 2017-05-13 ENCOUNTER — Telehealth: Payer: Self-pay | Admitting: General Surgery

## 2017-05-13 NOTE — Telephone Encounter (Signed)
Spoke with Demara regarding mammogram and ultrasound findings. Explained that these are highly suspicious of cancer and discussed referral to medical oncology asap.  Will discuss further tomorrow at the office and will potentially have pathology at that time.  Curlene Labrum, MD [facility] [address] [place], [place] [address] [phone] (office)

## 2017-05-14 ENCOUNTER — Ambulatory Visit (INDEPENDENT_AMBULATORY_CARE_PROVIDER_SITE_OTHER): Payer: Self-pay | Admitting: General Surgery

## 2017-05-14 ENCOUNTER — Encounter: Payer: Self-pay | Admitting: General Surgery

## 2017-05-14 ENCOUNTER — Encounter (HOSPITAL_COMMUNITY): Payer: Self-pay | Attending: Oncology

## 2017-05-14 ENCOUNTER — Other Ambulatory Visit (HOSPITAL_COMMUNITY): Payer: Self-pay | Admitting: Adult Health

## 2017-05-14 VITALS — BP 118/82 | HR 100 | Resp 18 | Ht 65.0 in | Wt 120.0 lb

## 2017-05-14 DIAGNOSIS — C50911 Malignant neoplasm of unspecified site of right female breast: Secondary | ICD-10-CM

## 2017-05-14 DIAGNOSIS — C773 Secondary and unspecified malignant neoplasm of axilla and upper limb lymph nodes: Principal | ICD-10-CM

## 2017-05-14 DIAGNOSIS — C50811 Malignant neoplasm of overlapping sites of right female breast: Secondary | ICD-10-CM

## 2017-05-14 LAB — CBC WITH DIFFERENTIAL/PLATELET
BASOS PCT: 0 %
Basophils Absolute: 0 10*3/uL (ref 0.0–0.1)
EOS ABS: 0 10*3/uL (ref 0.0–0.7)
Eosinophils Relative: 0 %
HEMATOCRIT: 42.5 % (ref 36.0–46.0)
HEMOGLOBIN: 14.7 g/dL (ref 12.0–15.0)
Lymphocytes Relative: 13 %
Lymphs Abs: 1.2 10*3/uL (ref 0.7–4.0)
MCH: 33.5 pg (ref 26.0–34.0)
MCHC: 34.6 g/dL (ref 30.0–36.0)
MCV: 96.8 fL (ref 78.0–100.0)
MONO ABS: 0.5 10*3/uL (ref 0.1–1.0)
Monocytes Relative: 5 %
NEUTROS ABS: 7.7 10*3/uL (ref 1.7–7.7)
NEUTROS PCT: 82 %
Platelets: 335 10*3/uL (ref 150–400)
RBC: 4.39 MIL/uL (ref 3.87–5.11)
RDW: 13.2 % (ref 11.5–15.5)
WBC: 9.4 10*3/uL (ref 4.0–10.5)

## 2017-05-14 LAB — COMPREHENSIVE METABOLIC PANEL
ALT: 14 U/L (ref 14–54)
AST: 18 U/L (ref 15–41)
Albumin: 4.3 g/dL (ref 3.5–5.0)
Alkaline Phosphatase: 61 U/L (ref 38–126)
Anion gap: 9 (ref 5–15)
BUN: 8 mg/dL (ref 6–20)
CO2: 27 mmol/L (ref 22–32)
Calcium: 9.5 mg/dL (ref 8.9–10.3)
Chloride: 102 mmol/L (ref 101–111)
Creatinine, Ser: 0.8 mg/dL (ref 0.44–1.00)
GFR calc Af Amer: >60 mL/min (ref >60)
GFR calc non Af Amer: >60 mL/min (ref >60)
Glucose, Bld: 93 mg/dL (ref 65–99)
Potassium: 4.1 mmol/L (ref 3.5–5.1)
Sodium: 138 mmol/L (ref 135–145)
Total Bilirubin: 0.4 mg/dL (ref 0.3–1.2)
Total Protein: 7.6 g/dL (ref 6.5–8.1)

## 2017-05-14 NOTE — Progress Notes (Signed)
[facility] History and Physical Note   HPI:  46 y.o. Female presents to clinic for follow up after her diagnostic mammogram and ultrasound with biopsy of the right breast masses and right axillary lymph node. 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 The patient returns today to discuss the plan and next steps.  Patient reports no pain or changes since the biopsy, denies fevers or chills.  Review of Systems:  Constitutional: denies any other weight loss, fever, chills, or sweats  Musculoskeletal: denies any other joint pains or bone pains  Breast: + open wound has not changed, biopsy sites with bandaides  Psychiatric: +anxiety  All other review of systems: otherwise negative   Past Medical History:  Diagnosis Date  . Medical history non-contributory    Past Surgical History:  Procedure Laterality Date  . BUNIONECTOMY Left 2000  . EXTERNAL FIXATION LEG Left 02/16/2017   Procedure: EXTERNAL FIXATION LEG;  Surgeon: Mcarthur Rossetti, MD;  Location: [place];  Service: Orthopedics;  Laterality: Left;  . ORIF ANKLE FRACTURE Left 03/03/2017  . ORIF ANKLE FRACTURE Left 03/03/2017   Procedure: OPEN REDUCTION INTERNAL FIXATION (ORIF) LEFT PILON FRACTURE AND LEFT LATERAL MALLEOLUS;  Surgeon: Mcarthur Rossetti, MD;  Location: [place];  Service: Orthopedics;  Laterality: Left;  . OVARIAN CYST REMOVAL Left 1991  . WISDOM TOOTH EXTRACTION     Social History   Social History  . Marital status: Single    Spouse name: N/A  . Number of children: N/A  . Years of education: N/A   Occupational History  . Not on file.   Social History Main Topics  . Smoking status: Former Smoker    Packs/day: 1.00    Years: 10.00    Types: Cigarettes    Quit date: 02/16/2017  . Smokeless tobacco: Never Used  . Alcohol use Yes     Comment: occasional  . Drug use: No  . Sexual activity: Not on file   Other Topics Concern  . Not on file   Social History  Narrative  . No narrative on file    Vital Signs:  BP 118/82   Pulse 100   Resp 18   Ht 5\' 5"  (1.651 m)   Wt 120 lb (54.4 kg)   LMP 05/12/2017   BMI 19.97 kg/m     Physical Exam  Constitutional: She is oriented to person, place, and time and well-developed, well-nourished, and in no distress. Vital signs are normal.  anxious  HENT:  Head: Normocephalic and atraumatic.  Eyes: Pupils are equal, round, and reactive to light.  Cardiovascular: Normal rate and regular rhythm.   Pulmonary/Chest: Effort normal and breath sounds normal. Right breast exhibits mass, skin change and tenderness.  Abdominal: Soft. Bowel sounds are normal. She exhibits no distension. There is no tenderness.  Musculoskeletal: Normal range of motion.  Left foot brace   Neurological: She is alert and oriented to person, place, and time.  Skin: Skin is warm and dry.  Psychiatric: Mood, memory, affect and judgment normal.        Laboratory studies: None  Imaging:   EXAM: 2D DIGITAL DIAGNOSTIC BILATERAL MAMMOGRAM WITH CAD AND ADJUNCT TOMO  ULTRASOUND RIGHT BREAST  COMPARISON:  None  ACR Breast Density Category c: The breast tissue is heterogeneously dense, which may obscure small masses.  FINDINGS: The left breast is negative. No mass, architectural distortion, or suspicious microcalcification is identified on the left.  On the right, there is diffuse skin thickening. Multiple masses  with associated architectural distortion are seen the inferior right breast including the lower inner and lower outer quadrants. Additionally, underlying the metallic skin marker with a patient feels a lump the upper-outer right breast far several obscured irregular masses.  Mammographic images were processed with CAD.  On physical exam, multiple masses are palpated diffusely in the right breast. There is a mass eroding through the skin in the inferior right breast centered along the inframammary fold,  which is oozing. Masses are palpated in the lower inner quadrant, lower outer quadrant, and the upper-outer quadrant. Masses extend far laterally, along the lateral margin of the right breast.  There is peau de orange of the right breast.  Prominent lymphadenopathy is palpated in the right axilla. No lymphadenopathy is palpated in the infraclavicular region.  Targeted ultrasound is performed, showing multiple irregular hypoechoic masses. In the 6 o'clock region of the right breast is a large irregular mass spans at least 4.8 cm. In the 4:00 to 5:00 region of the right breast an irregular mass measures 4.5 cm. In the 930-10 o'clock position of the right breast centered 9-10 cm from the nipple is an irregular 4.0 x 3.8 x 1.9 cm hypoechoic mass. In the 9:30 position of the right breast at the lateral margin is a probable abnormal intramammary lymph node measuring 1.3 cm greatest diameter. In the 10 o'clock position of the right breast far lateral is a hypoechoic dermal nodule that measures 0.5 cm. Diffuse skin thickening of the right breast measures up to 4.5 mm.  Ultrasound of the right axilla demonstrates an enlarged hypoechoic lymph node with probable extracapsular extension that extends to the skin, and is palpable. This lymph node measures 2.4 x 2.0 cm. At least 4 pathologic right axillary lymph nodes are imaged. Ultrasound imaging of the infraclavicular region demonstrates lymphadenopathy. Two adjacent abnormal lymph nodes are imaged, the largest measuring 1.7 x 1.4 cm.  IMPRESSION: Locally advanced multicentric neglected right breast cancer with a fungating mass along the inframammary fold and diffuse skin thickening. Right axillary and right infraclavicular pathologic lymphadenopathy  RECOMMENDATION: Ultrasound-guided biopsies are recommended of 2 masses in separate quadrants and of 1 of the right axillary lymph nodes. Biopsies will be performed today and dictated  separately.  I have discussed the findings and recommendations with the patient. Results were also provided in writing at the conclusion of the visit. If applicable, a reminder letter will be sent to the patient regarding the next appointment.  BI-RADS CATEGORY  5: Highly suggestive of malignancy.   Electronically Signed   By: Curlene Dolphin M.D.   On: 05/12/2017 16:53  Assessment:  46 y.o. yo Female with a problem list including: Patient Active Problem List   Diagnosis Date Noted  . Malignant neoplasm of overlapping sites of right female breast ([place]) 05/14/2017  . Breast mass, right 05/12/2017  . Closed left pilon fracture, initial encounter 02/16/2017    She presents to clinic discuss her pathology results which demonstrate a node positive invasive breast cancer with multiple areas of her breast involved including an area in the inframammary fold that has an ulcerated lesion.  I have discussed the patient extensively with medical oncology at [facility] and she has imaging and labs schedule for them this week and her first visit on Monday. I have discussed the case with Dr. Serita Grammes with [facility] who specializes in breast, and discussed neoadjuvant chemotherapy versus attempt at surgery to remove the wound. We believe that the wound will likely shrink  with neoadjuvant therapy and aid in closure of a modified radical mastectomy.    Plan:   - Metastatic workup ordered by oncology   - Oncology appointment on Monday  - Port placement on Wednesday, preop orders done for the port a cath   - Will return to clinic following the oncology workup/ plans / eventual surgery    All of the above recommendations were discussed with the patient and patient's family, and all of patient's and family's questions were answered to their expressed satisfaction.  Curlene Labrum, MD [facility] [address] [place], [place] [address] [phone]  (office)

## 2017-05-14 NOTE — H&P (Signed)
[facility] History and Physical Note   HPI:  46 y.o. Female presents to clinic for follow up after her diagnostic mammogram and ultrasound with biopsy of the right breast masses and right axillary lymph node. 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 The patient returns today to discuss the plan and next steps.  Patient reports no pain or changes since the biopsy, denies fevers or chills.  Review of Systems:  Constitutional: denies any other weight loss, fever, chills, or sweats  Musculoskeletal: denies any other joint pains or bone pains  Breast: + open wound has not changed, biopsy sites with bandaides  Psychiatric: +anxiety  All other review of systems: otherwise negative   Past Medical History:  Diagnosis Date  . Medical history non-contributory    Past Surgical History:  Procedure Laterality Date  . BUNIONECTOMY Left 2000  . EXTERNAL FIXATION LEG Left 02/16/2017   Procedure: EXTERNAL FIXATION LEG;  Surgeon: Mcarthur Rossetti, MD;  Location: [place];  Service: Orthopedics;  Laterality: Left;  . ORIF ANKLE FRACTURE Left 03/03/2017  . ORIF ANKLE FRACTURE Left 03/03/2017   Procedure: OPEN REDUCTION INTERNAL FIXATION (ORIF) LEFT PILON FRACTURE AND LEFT LATERAL MALLEOLUS;  Surgeon: Mcarthur Rossetti, MD;  Location: [place];  Service: Orthopedics;  Laterality: Left;  . OVARIAN CYST REMOVAL Left 1991  . WISDOM TOOTH EXTRACTION     Social History   Social History  . Marital status: Single    Spouse name: N/A  . Number of children: N/A  . Years of education: N/A   Occupational History  . Not on file.   Social History Main Topics  . Smoking status: Former Smoker    Packs/day: 1.00    Years: 10.00    Types: Cigarettes    Quit date: 02/16/2017  . Smokeless tobacco: Never Used  . Alcohol use Yes     Comment: occasional  . Drug use: No  . Sexual activity: Not on file   Other Topics Concern  . Not on file   Social History Narrative    . No narrative on file    Vital Signs:  BP 118/82   Pulse 100   Resp 18   Ht 5\' 5"  (1.651 m)   Wt 120 lb (54.4 kg)   LMP 05/12/2017   BMI 19.97 kg/m     Physical Exam  Constitutional: She is oriented to person, place, and time and well-developed, well-nourished, and in no distress. Vital signs are normal.  anxious  HENT:  Head: Normocephalic and atraumatic.  Eyes: Pupils are equal, round, and reactive to light.  Cardiovascular: Normal rate and regular rhythm.   Pulmonary/Chest: Effort normal and breath sounds normal. Right breast exhibits mass, skin change and tenderness.  Abdominal: Soft. Bowel sounds are normal. She exhibits no distension. There is no tenderness.  Musculoskeletal: Normal range of motion.  Left foot brace   Neurological: She is alert and oriented to person, place, and time.  Skin: Skin is warm and dry.  Psychiatric: Mood, memory, affect and judgment normal.        Laboratory studies: None  Imaging:   EXAM: 2D DIGITAL DIAGNOSTIC BILATERAL MAMMOGRAM WITH CAD AND ADJUNCT TOMO  ULTRASOUND RIGHT BREAST  COMPARISON:  None  ACR Breast Density Category c: The breast tissue is heterogeneously dense, which may obscure small masses.  FINDINGS: The left breast is negative. No mass, architectural distortion, or suspicious microcalcification is identified on the left.  On the right, there is diffuse skin thickening. Multiple  masses with associated architectural distortion are seen the inferior right breast including the lower inner and lower outer quadrants. Additionally, underlying the metallic skin marker with a patient feels a lump the upper-outer right breast far several obscured irregular masses.  Mammographic images were processed with CAD.  On physical exam, multiple masses are palpated diffusely in the right breast. There is a mass eroding through the skin in the inferior right breast centered along the inframammary fold, which  is oozing. Masses are palpated in the lower inner quadrant, lower outer quadrant, and the upper-outer quadrant. Masses extend far laterally, along the lateral margin of the right breast.  There is peau de orange of the right breast.  Prominent lymphadenopathy is palpated in the right axilla. No lymphadenopathy is palpated in the infraclavicular region.  Targeted ultrasound is performed, showing multiple irregular hypoechoic masses. In the 6 o'clock region of the right breast is a large irregular mass spans at least 4.8 cm. In the 4:00 to 5:00 region of the right breast an irregular mass measures 4.5 cm. In the 930-10 o'clock position of the right breast centered 9-10 cm from the nipple is an irregular 4.0 x 3.8 x 1.9 cm hypoechoic mass. In the 9:30 position of the right breast at the lateral margin is a probable abnormal intramammary lymph node measuring 1.3 cm greatest diameter. In the 10 o'clock position of the right breast far lateral is a hypoechoic dermal nodule that measures 0.5 cm. Diffuse skin thickening of the right breast measures up to 4.5 mm.  Ultrasound of the right axilla demonstrates an enlarged hypoechoic lymph node with probable extracapsular extension that extends to the skin, and is palpable. This lymph node measures 2.4 x 2.0 cm. At least 4 pathologic right axillary lymph nodes are imaged. Ultrasound imaging of the infraclavicular region demonstrates lymphadenopathy. Two adjacent abnormal lymph nodes are imaged, the largest measuring 1.7 x 1.4 cm.  IMPRESSION: Locally advanced multicentric neglected right breast cancer with a fungating mass along the inframammary fold and diffuse skin thickening. Right axillary and right infraclavicular pathologic lymphadenopathy  RECOMMENDATION: Ultrasound-guided biopsies are recommended of 2 masses in separate quadrants and of 1 of the right axillary lymph nodes. Biopsies will be performed today and dictated  separately.  I have discussed the findings and recommendations with the patient. Results were also provided in writing at the conclusion of the visit. If applicable, a reminder letter will be sent to the patient regarding the next appointment.  BI-RADS CATEGORY  5: Highly suggestive of malignancy.   Electronically Signed   By: Curlene Dolphin M.D.   On: 05/12/2017 16:53  Assessment:  46 y.o. yo Female with a problem list including: Patient Active Problem List   Diagnosis Date Noted  . Malignant neoplasm of overlapping sites of right female breast ([place]) 05/14/2017  . Breast mass, right 05/12/2017  . Closed left pilon fracture, initial encounter 02/16/2017    She presents to clinic discuss her pathology results which demonstrate a node positive invasive breast cancer with multiple areas of her breast involved including an area in the inframammary fold that has an ulcerated lesion.  I have discussed the patient extensively with medical oncology at [facility] and she has imaging and labs schedule for them this week and her first visit on Monday. I have discussed the case with Dr. Serita Grammes with [facility] who specializes in breast, and discussed neoadjuvant chemotherapy versus attempt at surgery to remove the wound. We believe that the wound will likely  shrink with neoadjuvant therapy and aid in closure of a modified radical mastectomy.    Plan:   - Metastatic workup ordered by oncology   - Oncology appointment on Monday  - Port placement on Wednesday, preop orders done for the port a cath   - Will return to clinic following the oncology workup/ plans / eventual surgery    All of the above recommendations were discussed with the patient and patient's family, and all of patient's and family's questions were answered to their expressed satisfaction.  Curlene Labrum, MD [facility] [address] [place], [place] [address] [phone]  (office)

## 2017-05-14 NOTE — Patient Instructions (Signed)
  Future Appointments Date Time Provider [place]  05/15/2017 9:00 AM AP-NM INJ 1 AP-NM [place] [facility]  05/15/2017 11:30 AM AP-NM 2 AP-NM [place] [facility]  05/15/2017 1:00 PM AP-CT 1 AP-CT [place] [facility]  05/18/2017 8:30 AM AP-ACAPA COVERING PROVIDER AP-ACAPA None  05/18/2017 10:30 AM AP ECHO 2-BAR[place] AP-CARDIOPUL [place] [facility]  05/19/2017 8:45 AM AP-DOIBP PAT 2 AP-DOIBP None  05/21/2017 4:15 PM Mcarthur Rossetti, MD PO-NW None

## 2017-05-15 ENCOUNTER — Encounter (HOSPITAL_COMMUNITY): Payer: Self-pay

## 2017-05-15 ENCOUNTER — Ambulatory Visit (HOSPITAL_COMMUNITY)
Admission: RE | Admit: 2017-05-15 | Discharge: 2017-05-15 | Disposition: A | Discharge status: Home / Self Care | Payer: Self-pay | Source: Ambulatory Visit | Attending: Adult Health | Admitting: Adult Health

## 2017-05-15 ENCOUNTER — Encounter (HOSPITAL_COMMUNITY)
Admission: RE | Admit: 2017-05-15 | Discharge: 2017-05-15 | Disposition: A | Discharge status: Home / Self Care | Payer: Self-pay | Source: Ambulatory Visit | Attending: Adult Health | Admitting: Adult Health

## 2017-05-15 DIAGNOSIS — C773 Secondary and unspecified malignant neoplasm of axilla and upper limb lymph nodes: Secondary | ICD-10-CM | POA: Insufficient documentation

## 2017-05-15 DIAGNOSIS — C50911 Malignant neoplasm of unspecified site of right female breast: Secondary | ICD-10-CM | POA: Insufficient documentation

## 2017-05-15 LAB — CANCER ANTIGEN 15-3: CAN 15 3: 115.9 U/mL — AB (ref 0.0–25.0)

## 2017-05-15 LAB — CANCER ANTIGEN 27.29: CAN 27.29: 157.7 U/mL — AB (ref 0.0–38.6)

## 2017-05-15 MED ORDER — IOPAMIDOL (ISOVUE-300) INJECTION 61%
100.0000 mL | Freq: Once | INTRAVENOUS | Status: AC | PRN
  Administered 2017-05-15: 100 mL via INTRAVENOUS

## 2017-05-15 MED ORDER — TECHNETIUM TC 99M MEDRONATE IV KIT: 25.0000 | PACK | Freq: Once | INTRAVENOUS | Status: DC | PRN

## 2017-05-18 ENCOUNTER — Encounter (HOSPITAL_COMMUNITY): Payer: Self-pay

## 2017-05-18 ENCOUNTER — Telehealth (INDEPENDENT_AMBULATORY_CARE_PROVIDER_SITE_OTHER): Payer: Self-pay | Admitting: Orthopaedic Surgery

## 2017-05-18 ENCOUNTER — Ambulatory Visit (HOSPITAL_COMMUNITY)
Admission: RE | Admit: 2017-05-18 | Discharge: 2017-05-18 | Disposition: A | Discharge status: Home / Self Care | Payer: Self-pay | Source: Ambulatory Visit | Attending: Adult Health | Admitting: Adult Health

## 2017-05-18 ENCOUNTER — Encounter (HOSPITAL_BASED_OUTPATIENT_CLINIC_OR_DEPARTMENT_OTHER): Payer: Self-pay | Admitting: Oncology

## 2017-05-18 VITALS — BP 107/63 | HR 77 | Resp 16 | Ht 65.0 in | Wt 120.0 lb

## 2017-05-18 DIAGNOSIS — I313 Pericardial effusion (noninflammatory): Secondary | ICD-10-CM | POA: Insufficient documentation

## 2017-05-18 DIAGNOSIS — C50811 Malignant neoplasm of overlapping sites of right female breast: Secondary | ICD-10-CM

## 2017-05-18 DIAGNOSIS — C773 Secondary and unspecified malignant neoplasm of axilla and upper limb lymph nodes: Secondary | ICD-10-CM

## 2017-05-18 DIAGNOSIS — C50911 Malignant neoplasm of unspecified site of right female breast: Secondary | ICD-10-CM

## 2017-05-18 DIAGNOSIS — Z87891 Personal history of nicotine dependence: Secondary | ICD-10-CM | POA: Insufficient documentation

## 2017-05-18 LAB — ECHOCARDIOGRAM COMPLETE
HEIGHTINCHES: 65 in
Weight: 1920 oz

## 2017-05-18 NOTE — Telephone Encounter (Signed)
Patient called this morning stating that there is a screw almost protruding through the skin around her ankle.

## 2017-05-18 NOTE — Progress Notes (Signed)
*  PRELIMINARY RESULTS* Echocardiogram 2D Echocardiogram has been performed.  Leavy Cella 05/18/2017, 11:38 AM

## 2017-05-18 NOTE — Progress Notes (Signed)
[facility] Initial Visit:  Patient Care Team: Patient, No Pcp Per as PCP - General (General Practice)  CHIEF COMPLAINTS/PURPOSE OF CONSULTATION:  Right multifocal breast cancer  HISTORY OF PRESENTING ILLNESS: Angel Hale 46 y.o.premenopausal female presents today for evaluation of high-grade right breast cancer. Patient states that 2 years ago she initially noted a mass in her right breast, however patient thought that it was a cyst and did not get any evaluation done. Her symptoms have worsened over time and she now has an open lesion which is draining in the inferior right breast along the inframammary fold as well as multiple masses in her breast. Patient is uninsured and does not have a PCP, so she was not receiving regular care.   She underwent a bilateral diagnostic mammogram with ultrasound on 05/12/2017 which demonstrated locally advanced multicentric neglected right breast cancer with a fungating mass along the inframammary fold and diffuse skin thickening. Right axillary and right infraclavicular pathologic lymphadenopathy.   Patient underwent ultrasound-guided biopsy on 05/12/2017 of the right medial lower inner quadrant breast which demonstrated invasive ductal carcinoma grade 3, biopsy of right upper outer quadrant breast demonstrated invasive ductal carcinoma grade 3, biopsy of right axillary lymph node demonstrated lymph node with metastatic carcinoma.  Staging bone scan 05/15/2017 demonstrated no evidence of metastatic disease.   Staging CT chest/abdomen/pelvis 05/15/17 demonstrated numerous right breast masses and metastatic right axillary lymphadenopathy. Findings suspicious for chest wall invasion along the lower lateral aspect of the breast. Two small indeterminate right lower lobe pulmonary nodules will require surveillance. No findings for metastatic disease involving the abdomen/pelvis or osseous structures.   Tumor markers: CA 15-3 on 05/14/2017  was 115.9. CA-27-29 on 05/14/2017 was 157.7.   She is currently on Bactrim for the open wound on her right breast.  Patient's case is complicated in that she had a horseback riding accident in July and underwent an OPEN REDUCTION INTERNAL FIXATION (ORIF) LEFT PILON FRACTURE AND LEFT LATERAL MALLEOLUS on 03/03/17.  However since then, she's had complications with a screw sticking out of her foot. She is seeing her orthopedic surgeon this upcoming Thursday to see one when she will have surgery to get this fixed.  Overall patient states that she feels well. She denies any chest pain, shortness breath, abdominal pain, weight loss.  Review of Systems - Oncology ROS as per HPI otherwise 12 point ROS is negative.  MEDICAL HISTORY: Past Medical History:  Diagnosis Date  . Medical history non-contributory     SURGICAL HISTORY: Past Surgical History:  Procedure Laterality Date  . BUNIONECTOMY Left 2000  . EXTERNAL FIXATION LEG Left 02/16/2017   Procedure: EXTERNAL FIXATION LEG;  Surgeon: Mcarthur Rossetti, MD;  Location: [facility];  Service: Orthopedics;  Laterality: Left;  . ORIF ANKLE FRACTURE Left 03/03/2017  . ORIF ANKLE FRACTURE Left 03/03/2017   Procedure: OPEN REDUCTION INTERNAL FIXATION (ORIF) LEFT PILON FRACTURE AND LEFT LATERAL MALLEOLUS;  Surgeon: Mcarthur Rossetti, MD;  Location: [facility];  Service: Orthopedics;  Laterality: Left;  . OVARIAN CYST REMOVAL Left 1991  . WISDOM TOOTH EXTRACTION      SOCIAL HISTORY: Social History   Social History  . Marital status: Single    Spouse name: N/A  . Number of children: N/A  . Years of education: N/A   Occupational History  . Not on file.   Social History Main Topics  . Smoking status: Former Smoker    Packs/day: 1.00    Years: 10.00  Types: Cigarettes    Quit date: 02/16/2017  . Smokeless tobacco: Never Used  . Alcohol use Yes     Comment: occasional  . Drug use: No  . Sexual activity: Not on file   Other Topics  Concern  . Not on file   Social History Narrative  . No narrative on file    FAMILY HISTORY Family History  Problem Relation Age of Onset  . Colon cancer Father     ALLERGIES:  is allergic to penicillins.  MEDICATIONS:  Current Outpatient Prescriptions  Medication Sig Dispense Refill  . Homeopathic Products (FRANKINCENSE UPLIFTING) OIL Take 1 capsule by mouth daily as needed (for home remedy).    . vitamin C (ASCORBIC ACID) 500 MG tablet Take 1 tablet (500 mg total) by mouth daily. 90 tablet 0  . acetaminophen (TYLENOL) 500 MG tablet Take 1,000 mg by mouth every 6 (six) hours as needed (for pain.).    Marland Kitchen aspirin 325 MG tablet Take 1 tablet (325 mg total) by mouth daily. (Patient not taking: Reported on 05/15/2017) 30 tablet 0  . Coenzyme Q10 (CO Q-10 PO) Take 1 tablet by mouth daily.    . Cyanocobalamin (B-12 PO) Take 1 tablet by mouth daily.    Marland Kitchen LAVENDER OIL PO Take 1 capsule by mouth daily as needed (for home remedy).    . methocarbamol (ROBAXIN) 500 MG tablet Take 1 tablet (500 mg total) by mouth every 6 (six) hours as needed for muscle spasms. (Patient not taking: Reported on 05/15/2017) 40 tablet 1  . Multiple Vitamins-Minerals (ZINC PO) Take 1 capsule by mouth daily.    Marland Kitchen oxyCODONE-acetaminophen (ROXICET) 5-325 MG tablet Take 1-2 tablets by mouth every 6 (six) hours as needed. (Patient not taking: Reported on 05/15/2017) 60 tablet 0  . sulfamethoxazole-trimethoprim (BACTRIM DS,SEPTRA DS) 800-160 MG tablet Take 2 tablets by mouth 2 (two) times daily.     No current facility-administered medications for this visit.    Facility-Administered Medications Ordered in Other Visits  Medication Dose Route Frequency Provider Last Rate Last Dose  . technetium medronate (TC-MDP) injection 25 millicurie  25 millicurie Intravenous Once PRN Gilford Silvius, MD        PHYSICAL EXAMINATION:  ECOG PERFORMANCE STATUS: 0 - Asymptomatic   Vitals:   05/18/17 0850  BP: 107/63  Pulse: 77   Resp: 16  SpO2: 100%    Filed Weights   05/18/17 0850  Weight: 120 lb (54.4 kg)     Physical Exam Constitutional: Well-developed, well-nourished, and in no distress.   HENT:  Head: Normocephalic and atraumatic.  Mouth/Throat: No oropharyngeal exudate. Mucosa moist. Eyes: Pupils are equal, round, and reactive to light. Conjunctivae are normal. No scleral icterus.  Neck: Normal range of motion. Neck supple. No JVD present.  Cardiovascular: Normal rate, regular rhythm and normal heart sounds.  Exam reveals no gallop and no friction rub.   No murmur heard. Pulmonary/Chest: Effort normal and breath sounds normal. No respiratory distress. No wheezes.No rales.  Abdominal: Soft. Bowel sounds are normal. No distension. There is no tenderness. There is no guarding.  Musculoskeletal: No edema or tenderness.  Lymphadenopathy:    No cervical or supraclavicular adenopathy.  Neurological: Alert and oriented to person, place, and time. No cranial nerve deficit.  Skin: Skin is warm and dry. No rash noted. No erythema. No pallor.  Psychiatric: Affect and judgment normal.  BREAST: right breast: peau d'orange, ecchymosis at biopsy sites, firm mass palpated on the RUO quadrant of her breast,  breast is firm in the RLQ with an open wound in the 6 o'clock position measuring 2 cm. +Right axillary lymphadenopathy. Left breast without masses, skin changes, nipple discharge, or axillary lymphadenopathy.  LABORATORY DATA: I have personally reviewed the data as listed:  Appointment on 05/14/2017  Component Date Value Ref Range Status  . WBC 05/14/2017 9.4  4.0 - 10.5 K/uL Final  . RBC 05/14/2017 4.39  3.87 - 5.11 MIL/uL Final  . Hemoglobin 05/14/2017 14.7  12.0 - 15.0 g/dL Final  . HCT 05/14/2017 42.5  36.0 - 46.0 % Final  . MCV 05/14/2017 96.8  78.0 - 100.0 fL Final  . MCH 05/14/2017 33.5  26.0 - 34.0 pg Final  . MCHC 05/14/2017 34.6  30.0 - 36.0 g/dL Final  . RDW 05/14/2017 13.2  11.5 - 15.5 % Final   . Platelets 05/14/2017 335  150 - 400 K/uL Final  . Neutrophils Relative % 05/14/2017 82  % Final  . Neutro Abs 05/14/2017 7.7  1.7 - 7.7 K/uL Final  . Lymphocytes Relative 05/14/2017 13  % Final  . Lymphs Abs 05/14/2017 1.2  0.7 - 4.0 K/uL Final  . Monocytes Relative 05/14/2017 5  % Final  . Monocytes Absolute 05/14/2017 0.5  0.1 - 1.0 K/uL Final  . Eosinophils Relative 05/14/2017 0  % Final  . Eosinophils Absolute 05/14/2017 0.0  0.0 - 0.7 K/uL Final  . Basophils Relative 05/14/2017 0  % Final  . Basophils Absolute 05/14/2017 0.0  0.0 - 0.1 K/uL Final  . Sodium 05/14/2017 138  135 - 145 mmol/L Final  . Potassium 05/14/2017 4.1  3.5 - 5.1 mmol/L Final  . Chloride 05/14/2017 102  101 - 111 mmol/L Final  . CO2 05/14/2017 27  22 - 32 mmol/L Final  . Glucose, Bld 05/14/2017 93  65 - 99 mg/dL Final  . BUN 05/14/2017 8  6 - 20 mg/dL Final  . Creatinine, Ser 05/14/2017 0.80  0.44 - 1.00 mg/dL Final  . Calcium 05/14/2017 9.5  8.9 - 10.3 mg/dL Final  . Total Protein 05/14/2017 7.6  6.5 - 8.1 g/dL Final  . Albumin 05/14/2017 4.3  3.5 - 5.0 g/dL Final  . AST 05/14/2017 18  15 - 41 U/L Final  . ALT 05/14/2017 14  14 - 54 U/L Final  . Alkaline Phosphatase 05/14/2017 61  38 - 126 U/L Final  . Total Bilirubin 05/14/2017 0.4  0.3 - 1.2 mg/dL Final  . GFR calc non Af Amer 05/14/2017 >60  >60 mL/min Final  . GFR calc Af Amer 05/14/2017 >60  >60 mL/min Final   Comment: (NOTE) The eGFR has been calculated using the CKD EPI equation. This calculation has not been validated in all clinical situations. eGFR's persistently <60 mL/min signify possible Chronic Kidney Disease.   . Anion gap 05/14/2017 9  5 - 15 Final  . CA 15-3 05/14/2017 115.9* 0.0 - 25.0 U/mL Final   Comment: (NOTE) Roche ECLIA  methodology Performed At: [facility] [place], [place] [id number] Lindon Romp MD QK:[id number]   . CA 27.29 05/14/2017 157.7* 0.0 - 38.6 U/mL Final   Comment: (NOTE) Bayer  Centaur/ACS methodology Performed At: [facility] [place], [place] [id number] Lindon Romp MD OV:[id number]     RADIOGRAPHIC STUDIES: I have personally reviewed the radiological images as listed and agree with the findings in the report  Ct Chest W Contrast  Result Date: 05/18/2017 CLINICAL DATA:  New diagnosis breast cancer. EXAM:  CT CHEST, ABDOMEN, AND PELVIS WITH CONTRAST TECHNIQUE: Multidetector CT imaging of the chest, abdomen and pelvis was performed following the standard protocol during bolus administration of intravenous contrast. CONTRAST:  152m ISOVUE-300 IOPAMIDOL (ISOVUE-300) INJECTION 61% COMPARISON:  None. FINDINGS: CT CHEST FINDINGS Cardiovascular: The heart is normal in size. No pericardial effusion. Small amount of pericardial fluid. The aorta is normal in caliber. No dissection. No atherosclerotic calcifications. The branch vessels are normal. Mediastinum/Nodes: No mediastinal or hilar mass or lymphadenopathy. Calcified right paratracheal lymph nodes are noted. The esophagus is grossly normal. Lungs/Pleura: 5 mm pulmonary nodule in the right lower lobe on image number are 94. Subpleural 5 mm pulmonary nodule in the right lower lobe on image number 87. These nodules are indeterminate and will require surveillance. Small calcified granuloma in the right lower lobe on image number 125. These nodules are indeterminate. Chest wall/ Musculoskeletal: There are multiple right breast masses 10 and 18 mm nodules are noted in the upper right breast. There is a 2.2 x 1.8 cm mass in the upper right lateral breast and 3 masses in the lower right breast the 2 largest measures 2.4 x 1.8 cm and 3.0 x 2.3 cm. Findings suspicious for chest wall tumor along the inferior and lateral aspect of the breast. There are enlarged right axillary and subpectoral lymph nodes consistent with local metastatic disease. 10 mm lymph node on image 17. 12.5 mm subpectoral node on image 19. 14  mm axillary lymph node on image number 23. The left breast appears grossly normal. No left axillary lymph nodes are identified. No supraclavicular lymphadenopathy. No internal mammary lymphadenopathy. No worrisome lytic or sclerotic bone lesions to suggest metastatic disease. CT ABDOMEN PELVIS FINDINGS Hepatobiliary: No focal hepatic lesions to suggest hepatic metastatic disease. The gallbladder is normal. No common bile duct dilatation. Pancreas: No mass, inflammation or ductal dilatation. Spleen: Normal size.  No lesions. Adrenals/Urinary Tract: The adrenal glands, kidneys and bladder are unremarkable. Stomach/Bowel: The stomach, duodenum, small bowel and colon are grossly normal with oral contrast. No inflammatory changes, mass lesions or obstructive findings. The appendix is normal. Vascular/Lymphatic: The aorta is normal in caliber. No dissection. The branch vessels are patent. The major venous structures are patent. No mesenteric or retroperitoneal mass or adenopathy. Small scattered lymph nodes are noted. Reproductive: The uterus and ovaries are unremarkable. Other: No pelvic mass or adenopathy. No free pelvic fluid collections. No inguinal mass or adenopathy. No abdominal wall hernia or subcutaneous lesions. Musculoskeletal: No worrisome bone lesions to suggest metastatic disease. IMPRESSION: 1. Numerous right breast masses and metastatic right axillary lymphadenopathy. Findings suspicious for chest wall invasion along the lower lateral aspect of the breast. 2. Two small indeterminate right lower lobe pulmonary nodules will require surveillance. 3. No findings for metastatic disease involving the abdomen/pelvis or osseous structures. Electronically Signed   By: PMarijo SanesM.D.   On: 05/18/2017 08:42   Nm Bone Scan Whole Body  Result Date: 05/15/2017 CLINICAL DATA:  Recent diagnosis of breast cancer. Left lower extremity prior surgery 03/03/2017. EXAM: NUCLEAR MEDICINE WHOLE BODY BONE SCAN TECHNIQUE:  Whole body anterior and posterior images were obtained approximately 3 hours after intravenous injection of radiopharmaceutical. RADIOPHARMACEUTICALS:  MCi Technetium-927mDP IV COMPARISON:  Left lower extremity 17 2018. FINDINGS: Bilateral renal function excretion. Increased activity noted over the left tibia primarily distally consistent with recent fracture and surgical plate and screw fixation. Mild increase in activity noted over the proximal and mid tibial most likely from prior trauma/surgery. No other  significant abnormal bony increased activity noted. IMPRESSION: No evidence of metastatic disease. Electronically Signed   By: Marcello Moores  Register   On: 05/15/2017 15:10   Ct Abdomen Pelvis W Contrast  Result Date: 05/18/2017 CLINICAL DATA:  New diagnosis breast cancer. EXAM: CT CHEST, ABDOMEN, AND PELVIS WITH CONTRAST TECHNIQUE: Multidetector CT imaging of the chest, abdomen and pelvis was performed following the standard protocol during bolus administration of intravenous contrast. CONTRAST:  145m ISOVUE-300 IOPAMIDOL (ISOVUE-300) INJECTION 61% COMPARISON:  None. FINDINGS: CT CHEST FINDINGS Cardiovascular: The heart is normal in size. No pericardial effusion. Small amount of pericardial fluid. The aorta is normal in caliber. No dissection. No atherosclerotic calcifications. The branch vessels are normal. Mediastinum/Nodes: No mediastinal or hilar mass or lymphadenopathy. Calcified right paratracheal lymph nodes are noted. The esophagus is grossly normal. Lungs/Pleura: 5 mm pulmonary nodule in the right lower lobe on image number are 94. Subpleural 5 mm pulmonary nodule in the right lower lobe on image number 87. These nodules are indeterminate and will require surveillance. Small calcified granuloma in the right lower lobe on image number 125. These nodules are indeterminate. Chest wall/ Musculoskeletal: There are multiple right breast masses 10 and 18 mm nodules are noted in the upper right breast. There  is a 2.2 x 1.8 cm mass in the upper right lateral breast and 3 masses in the lower right breast the 2 largest measures 2.4 x 1.8 cm and 3.0 x 2.3 cm. Findings suspicious for chest wall tumor along the inferior and lateral aspect of the breast. There are enlarged right axillary and subpectoral lymph nodes consistent with local metastatic disease. 10 mm lymph node on image 17. 12.5 mm subpectoral node on image 19. 14 mm axillary lymph node on image number 23. The left breast appears grossly normal. No left axillary lymph nodes are identified. No supraclavicular lymphadenopathy. No internal mammary lymphadenopathy. No worrisome lytic or sclerotic bone lesions to suggest metastatic disease. CT ABDOMEN PELVIS FINDINGS Hepatobiliary: No focal hepatic lesions to suggest hepatic metastatic disease. The gallbladder is normal. No common bile duct dilatation. Pancreas: No mass, inflammation or ductal dilatation. Spleen: Normal size.  No lesions. Adrenals/Urinary Tract: The adrenal glands, kidneys and bladder are unremarkable. Stomach/Bowel: The stomach, duodenum, small bowel and colon are grossly normal with oral contrast. No inflammatory changes, mass lesions or obstructive findings. The appendix is normal. Vascular/Lymphatic: The aorta is normal in caliber. No dissection. The branch vessels are patent. The major venous structures are patent. No mesenteric or retroperitoneal mass or adenopathy. Small scattered lymph nodes are noted. Reproductive: The uterus and ovaries are unremarkable. Other: No pelvic mass or adenopathy. No free pelvic fluid collections. No inguinal mass or adenopathy. No abdominal wall hernia or subcutaneous lesions. Musculoskeletal: No worrisome bone lesions to suggest metastatic disease. IMPRESSION: 1. Numerous right breast masses and metastatic right axillary lymphadenopathy. Findings suspicious for chest wall invasion along the lower lateral aspect of the breast. 2. Two small indeterminate right lower  lobe pulmonary nodules will require surveillance. 3. No findings for metastatic disease involving the abdomen/pelvis or osseous structures. Electronically Signed   By: PMarijo SanesM.D.   On: 05/18/2017 08:42   PATHOLOGY: Diagnosis 1. Breast, right, needle core biopsy, medial lower inner quadrant - INVASIVE DUCTAL CARCINOMA, GRADE 3. - SEE MICROSCOPIC DESCRIPTION. 2. Breast, right, needle core biopsy, upper outer quadrant - INVASIVE DUCTAL CARCINOMA, GRADE 3. - SEE MICROSCOPIC DESCRIPTION. 3. Lymph node, needle/core biopsy, right axillary - LYMPH NODE WITH METASTATIC CARCINOMA. Microscopic Comment 1. -3.  Breast prognostic profile will be performed. Called to Dr. Radford Pax on 05/14/17. (JDP:gt, 05/14/17)  ASSESSMENT/PLAN: Cancer Staging Malignant neoplasm of overlapping sites of right female breast [facility]) Staging form: Breast, AJCC 8th Edition - Clinical: cT4b, cN3a, cM0, G3 - Unsigned  PLAN: -Reviewed patient's path report and staging scans with her in detail. She has locally advanced stage III breast cancer without evidence of metastatic disease. -She would benefit from neoadjuvant chemotherapy, however I am awaiting her hormone profile from biopsy to determine treatment plan depending on her ER/PR/HER2 status.  -Continue bactrim for her open breast wound. -ECHO scheduled for today. -Follow up with her orthopedic surgeon Dr. Rush Farmer on 05/21/17 to determine surgical plans for her left foot issues. If she is going for surgery, and it needs to be done now, it is preferable that she gets surgery prior to starting chemotherapy. Will then need to initiate chemo about 4 weeks afterwards to allow for wound healing.  -I am also concerned about her breast wound not healing properly while she is on chemotherapy. This will need to be closely monitored. -Chemoport placement is scheduled for next Wednesday. -RTC in 1 week to discuss plan of care and chemo, hopefully her hormone profile will be back by  then.  -Plan of care was discussed in detail with the patient today.  All questions were answered. The patient knows to call the clinic with any problems, questions or concerns.  This note was electronically signed.    Twana First, MD  05/18/2017 9:45 AM

## 2017-05-18 NOTE — Telephone Encounter (Signed)
Angel Hale is scheduling her soon

## 2017-05-19 ENCOUNTER — Encounter (HOSPITAL_COMMUNITY)
Admission: RE | Admit: 2017-05-19 | Discharge: 2017-05-19 | Disposition: A | Discharge status: Home / Self Care | Payer: Self-pay | Source: Ambulatory Visit | Attending: General Surgery | Admitting: General Surgery

## 2017-05-19 ENCOUNTER — Encounter (HOSPITAL_COMMUNITY): Payer: Self-pay

## 2017-05-20 ENCOUNTER — Ambulatory Visit (HOSPITAL_COMMUNITY): Payer: Self-pay

## 2017-05-20 ENCOUNTER — Encounter (HOSPITAL_COMMUNITY): Payer: Self-pay | Admitting: *Deleted

## 2017-05-20 ENCOUNTER — Encounter (HOSPITAL_COMMUNITY)
Admission: RE | Disposition: A | Discharge status: Home / Self Care | Payer: Self-pay | Source: Ambulatory Visit | Attending: General Surgery

## 2017-05-20 ENCOUNTER — Ambulatory Visit (HOSPITAL_COMMUNITY): Payer: Self-pay | Admitting: Anesthesiology

## 2017-05-20 ENCOUNTER — Ambulatory Visit (HOSPITAL_COMMUNITY)
Admission: RE | Admit: 2017-05-20 | Discharge: 2017-05-20 | Disposition: A | Discharge status: Home / Self Care | Payer: Self-pay | Source: Ambulatory Visit | Attending: General Surgery | Admitting: General Surgery

## 2017-05-20 ENCOUNTER — Other Ambulatory Visit (INDEPENDENT_AMBULATORY_CARE_PROVIDER_SITE_OTHER): Payer: Self-pay | Admitting: Physician Assistant

## 2017-05-20 ENCOUNTER — Encounter (HOSPITAL_COMMUNITY): Payer: Self-pay | Admitting: Anesthesiology

## 2017-05-20 DIAGNOSIS — Z95828 Presence of other vascular implants and grafts: Secondary | ICD-10-CM

## 2017-05-20 DIAGNOSIS — Z87891 Personal history of nicotine dependence: Secondary | ICD-10-CM | POA: Insufficient documentation

## 2017-05-20 DIAGNOSIS — C773 Secondary and unspecified malignant neoplasm of axilla and upper limb lymph nodes: Secondary | ICD-10-CM | POA: Insufficient documentation

## 2017-05-20 DIAGNOSIS — C50811 Malignant neoplasm of overlapping sites of right female breast: Secondary | ICD-10-CM | POA: Insufficient documentation

## 2017-05-20 HISTORY — PX: PORTACATH PLACEMENT: SHX2246

## 2017-05-20 LAB — HCG, SERUM, QUALITATIVE: PREG SERUM: NEGATIVE

## 2017-05-20 SURGERY — INSERTION, TUNNELED CENTRAL VENOUS DEVICE, WITH PORT
Anesthesia: Monitor Anesthesia Care | Site: Chest | Laterality: Left

## 2017-05-20 MED ORDER — MIDAZOLAM HCL 2 MG/2ML IJ SOLN
INTRAMUSCULAR | Status: AC
  Filled 2017-05-20: qty 2

## 2017-05-20 MED ORDER — HYDROCODONE-ACETAMINOPHEN 5-325 MG PO TABS: 1.0000 | ORAL_TABLET | ORAL | 0 refills | Status: DC | PRN

## 2017-05-20 MED ORDER — BUPIVACAINE HCL (PF) 0.5 % IJ SOLN
INTRAMUSCULAR | Status: AC
Start: 2017-05-20 — End: 2017-05-20
  Filled 2017-05-20: qty 30

## 2017-05-20 MED ORDER — LACTATED RINGERS IV SOLN
INTRAVENOUS | Status: DC
  Administered 2017-05-20: 12:00:00 via INTRAVENOUS

## 2017-05-20 MED ORDER — MIDAZOLAM HCL 2 MG/2ML IJ SOLN
1.0000 mg | INTRAMUSCULAR | Status: AC
  Administered 2017-05-20 (×2): 2 mg via INTRAVENOUS
  Filled 2017-05-20: qty 2

## 2017-05-20 MED ORDER — ONDANSETRON HCL 4 MG/2ML IJ SOLN
4.0000 mg | Freq: Once | INTRAMUSCULAR | Status: AC
  Administered 2017-05-20: 4 mg via INTRAVENOUS

## 2017-05-20 MED ORDER — PROPOFOL 10 MG/ML IV BOLUS
INTRAVENOUS | Status: DC | PRN
  Administered 2017-05-20: 20 mg via INTRAVENOUS

## 2017-05-20 MED ORDER — DEXAMETHASONE SODIUM PHOSPHATE 4 MG/ML IJ SOLN
4.0000 mg | INTRAMUSCULAR | Status: AC
  Administered 2017-05-20: 4 mg via INTRAVENOUS

## 2017-05-20 MED ORDER — FENTANYL CITRATE (PF) 100 MCG/2ML IJ SOLN
INTRAMUSCULAR | Status: AC
  Filled 2017-05-20: qty 2

## 2017-05-20 MED ORDER — LIDOCAINE HCL (PF) 1 % IJ SOLN
INTRAMUSCULAR | Status: DC | PRN
  Administered 2017-05-20: 8 mL

## 2017-05-20 MED ORDER — FENTANYL CITRATE (PF) 100 MCG/2ML IJ SOLN
25.0000 ug | Freq: Once | INTRAMUSCULAR | Status: AC
  Administered 2017-05-20: 25 ug via INTRAVENOUS

## 2017-05-20 MED ORDER — VANCOMYCIN HCL IN DEXTROSE 1-5 GM/200ML-% IV SOLN
INTRAVENOUS | Status: AC
  Filled 2017-05-20: qty 200

## 2017-05-20 MED ORDER — LIDOCAINE HCL (PF) 1 % IJ SOLN
INTRAMUSCULAR | Status: AC
  Filled 2017-05-20: qty 30

## 2017-05-20 MED ORDER — CHLORHEXIDINE GLUCONATE CLOTH 2 % EX PADS: 6.0000 | MEDICATED_PAD | Freq: Once | CUTANEOUS | Status: DC

## 2017-05-20 MED ORDER — VANCOMYCIN HCL IN DEXTROSE 1-5 GM/200ML-% IV SOLN
1000.0000 mg | INTRAVENOUS | Status: AC
  Administered 2017-05-20: 1000 mg via INTRAVENOUS

## 2017-05-20 MED ORDER — SODIUM CHLORIDE 0.9 % IV SOLN
INTRAVENOUS | Status: AC | PRN
  Administered 2017-05-20: 500 mL

## 2017-05-20 MED ORDER — PROPOFOL 10 MG/ML IV BOLUS
INTRAVENOUS | Status: AC
  Filled 2017-05-20: qty 20

## 2017-05-20 MED ORDER — ONDANSETRON HCL 4 MG/2ML IJ SOLN
INTRAMUSCULAR | Status: AC
  Filled 2017-05-20: qty 2

## 2017-05-20 MED ORDER — PROPOFOL 500 MG/50ML IV EMUL
INTRAVENOUS | Status: DC | PRN
  Administered 2017-05-20: 75 ug/kg/min via INTRAVENOUS

## 2017-05-20 MED ORDER — FENTANYL CITRATE (PF) 100 MCG/2ML IJ SOLN: 25.0000 ug | INTRAMUSCULAR | Status: DC | PRN

## 2017-05-20 MED ORDER — DEXAMETHASONE SODIUM PHOSPHATE 4 MG/ML IJ SOLN
INTRAMUSCULAR | Status: AC
Start: 2017-05-20 — End: 2017-05-20
  Filled 2017-05-20: qty 1

## 2017-05-20 MED ORDER — HEPARIN SOD (PORK) LOCK FLUSH 100 UNIT/ML IV SOLN
INTRAVENOUS | Status: AC
  Filled 2017-05-20: qty 5

## 2017-05-20 MED ORDER — MIDAZOLAM HCL 5 MG/5ML IJ SOLN
INTRAMUSCULAR | Status: DC | PRN
  Administered 2017-05-20: 2 mg via INTRAVENOUS

## 2017-05-20 MED ORDER — HEPARIN SOD (PORK) LOCK FLUSH 100 UNIT/ML IV SOLN
INTRAVENOUS | Status: DC | PRN
  Administered 2017-05-20: 500 [IU]

## 2017-05-20 SURGICAL SUPPLY — 35 items
ADH SKN CLS APL DERMABOND .7 (GAUZE/BANDAGES/DRESSINGS) ×1
BAG DECANTER FOR FLEXI CONT (MISCELLANEOUS) ×3 IMPLANT
BAG HAMPER (MISCELLANEOUS) ×3 IMPLANT
CHLORAPREP W/TINT 10.5 ML (MISCELLANEOUS) ×3 IMPLANT
CLOTH BEACON ORANGE TIMEOUT ST (SAFETY) ×3 IMPLANT
COVER LIGHT HANDLE STERIS (MISCELLANEOUS) ×6 IMPLANT
DECANTER SPIKE VIAL GLASS SM (MISCELLANEOUS) ×3 IMPLANT
DERMABOND ADVANCED (GAUZE/BANDAGES/DRESSINGS) ×2
DERMABOND ADVANCED .7 DNX12 (GAUZE/BANDAGES/DRESSINGS) ×1 IMPLANT
DRAPE C-ARM FOLDED MOBILE STRL (DRAPES) ×3 IMPLANT
ELECT REM PT RETURN 9FT ADLT (ELECTROSURGICAL) ×3
ELECTRODE REM PT RTRN 9FT ADLT (ELECTROSURGICAL) ×1 IMPLANT
GLOVE BIO SURGEON STRL SZ 6.5 (GLOVE) ×2 IMPLANT
GLOVE BIO SURGEONS STRL SZ 6.5 (GLOVE) ×1
GLOVE BIOGEL PI IND STRL 6.5 (GLOVE) ×1 IMPLANT
GLOVE BIOGEL PI IND STRL 7.0 (GLOVE) ×1 IMPLANT
GLOVE BIOGEL PI INDICATOR 6.5 (GLOVE) ×2
GLOVE BIOGEL PI INDICATOR 7.0 (GLOVE) ×4
GOWN STRL REUS W/TWL LRG LVL3 (GOWN DISPOSABLE) ×6 IMPLANT
IV NS 500ML (IV SOLUTION) ×3
IV NS 500ML BAXH (IV SOLUTION) ×1 IMPLANT
KIT PORT POWER 8FR ISP MRI (Port) ×3 IMPLANT
KIT ROOM TURNOVER APOR (KITS) ×3 IMPLANT
MANIFOLD NEPTUNE II (INSTRUMENTS) ×3 IMPLANT
NDL HYPO 25X1 1.5 SAFETY (NEEDLE) ×1 IMPLANT
NEEDLE HYPO 25X1 1.5 SAFETY (NEEDLE) ×3 IMPLANT
PACK MINOR (CUSTOM PROCEDURE TRAY) ×3 IMPLANT
PAD ARMBOARD 7.5X6 YLW CONV (MISCELLANEOUS) ×3 IMPLANT
SET BASIN LINEN APH (SET/KITS/TRAYS/PACK) ×3 IMPLANT
SUT PROLENE 2 0 SH 30 (SUTURE) ×2 IMPLANT
SUT VIC AB 3-0 SH 27 (SUTURE) ×3
SUT VIC AB 3-0 SH 27X BRD (SUTURE) ×1 IMPLANT
SUT VIC AB 4-0 PS2 27 (SUTURE) ×3 IMPLANT
SYR 20CC LL (SYRINGE) ×3 IMPLANT
SYR CONTROL 10ML LL (SYRINGE) ×3 IMPLANT

## 2017-05-20 NOTE — Anesthesia Postprocedure Evaluation (Signed)
Anesthesia Post Note  Patient: Angel Hale  Procedure(s) Performed: Procedure(s) (LRB): INSERTION PORT-A-CATH (Left)  Patient location during evaluation: Short Stay Anesthesia Type: MAC Level of consciousness: awake and alert Pain management: satisfactory to patient Vital Signs Assessment: post-procedure vital signs reviewed and stable Respiratory status: spontaneous breathing Cardiovascular status: stable Postop Assessment: no apparent nausea or vomiting and adequate PO intake Anesthetic complications: no     Last Vitals:  Vitals:   05/20/17 1445 05/20/17 1456  BP: 107/67 123/83  Pulse: 67 80  Resp: 18 18  Temp:  36.7 C  SpO2: 100% 100%    Last Pain:  Vitals:   05/20/17 1053  TempSrc: Oral                 Courtney Fenlon

## 2017-05-20 NOTE — Interval H&P Note (Signed)
History and Physical Interval Note:  05/20/2017 11:45 AM  Angel Hale  has presented today for surgery, with the diagnosis of right breast cancer  The various methods of treatment have been discussed with the patient and family. After consideration of risks, benefits and other options for treatment, the patient has consented to  Procedure(s): INSERTION PORT-A-CATH (Left) as a surgical intervention .  The patient's history has been reviewed, patient examined, no change in status, stable for surgery.  I have reviewed the patient's chart and labs.  Questions were answered to the patient's satisfaction.     Virl Cagey

## 2017-05-20 NOTE — Anesthesia Preprocedure Evaluation (Signed)
Anesthesia Evaluation  Patient identified by MRN, date of birth, ID band Patient awake    Reviewed: Allergy & Precautions, NPO status , Patient's Chart, lab work & pertinent test results  Airway Mallampati: II  TM Distance: >3 FB Neck ROM: Full    Dental no notable dental hx.    Pulmonary Current Smoker, former smoker,    Pulmonary exam normal breath sounds clear to auscultation       Cardiovascular negative cardio ROS Normal cardiovascular exam Rhythm:Regular Rate:Normal     Neuro/Psych negative neurological ROS  negative psych ROS   GI/Hepatic negative GI ROS, Neg liver ROS,   Endo/Other  negative endocrine ROS  Renal/GU negative Renal ROS  negative genitourinary   Musculoskeletal negative musculoskeletal ROS (+)   Abdominal   Peds negative pediatric ROS (+)  Hematology negative hematology ROS (+)   Anesthesia Other Findings   Reproductive/Obstetrics negative OB ROS                             Anesthesia Physical Anesthesia Plan  ASA: I  Anesthesia Plan: MAC   Post-op Pain Management:    Induction: Intravenous  PONV Risk Score and Plan: Ondansetron  Airway Management Planned: Simple Face Mask  Additional Equipment:   Intra-op Plan:   Post-operative Plan:   Informed Consent: I have reviewed the patients History and Physical, chart, labs and discussed the procedure including the risks, benefits and alternatives for the proposed anesthesia with the patient or authorized representative who has indicated his/her understanding and acceptance.     Plan Discussed with:   Anesthesia Plan Comments:         Anesthesia Quick Evaluation

## 2017-05-20 NOTE — Transfer of Care (Signed)
Immediate Anesthesia Transfer of Care Note  Patient: Tunisha Easterday  Procedure(s) Performed: Procedure(s): INSERTION PORT-A-CATH (Left)  Patient Location: PACU  Anesthesia Type:MAC  Level of Consciousness: awake, alert  and oriented  Airway & Oxygen Therapy: Patient Spontanous Breathing  Post-op Assessment: Report given to RN and Post -op Vital signs reviewed and stable  Post vital signs: Reviewed and stable  Last Vitals:  Vitals:   05/20/17 1203 05/20/17 1204  BP:    Pulse:    Resp: 19 (!) 39  Temp:    SpO2: 100% 100%    Last Pain:  Vitals:   05/20/17 1053  TempSrc: Oral      Patients Stated Pain Goal: 5 (57/84/69 [id number])  Complications: No apparent anesthesia complications

## 2017-05-20 NOTE — Progress Notes (Signed)
CXR with good position. No PTX.  Curlene Labrum, MD [facility] [address] [place], [place] [address] [phone] (office)

## 2017-05-20 NOTE — Op Note (Signed)
Operative Note 05/20/17   Preoperative Diagnosis: Right breast cancer   Postoperative Diagnosis: Same   Procedure(s) Performed: Port-A-Cath placement, left subclavian vein   Surgeon: Lanell Matar. Constance Haw, MD   Assistants: No qualified resident was available   Anesthesia: Monitored anesthesia care   Anesthesiologist: Lerry Liner, MD    Specimens: None   Estimated Blood Loss: Minimal   Fluoroscopy time: 23 seconds   Blood Replacement: None    Complications: None    Operative Findings: Normal anatomy.  Indications: Ms. Funchess is a 46 yo with a local advanced invasive right breast cancer and positive lymph node. She needs to undergo neoadjuvant chemotherapy, and after a discussion of the risk and benefits of port-a-cath placement she opted to proceed with placement.   Procedure: The patient was brought into the operating room and monitored anesthesia care was induced.  The left chest was prepped and draped in the usual sterile fashion.  Preoperative antibiotics were given, due to the patient's open skin wound on the right breast. One percent lidocaine was used for local anesthesia. An incision was made below the left clavicle. A subcutaneous pocket was formed. The needles advanced into the left subclavian vein using the Seldinger technique without difficulty. A guidewire was then advanced into the right atrium under fluoroscopic guidance.  Ectopia was noted. An introducer and peel-away sheath were placed over the guidewire. The catheter was then inserted through the peel-away sheath and the peel-away sheath was removed.  A spot film was performed to confirm the position. The catheter was then attached to the port and the port placed in subcutaneous pocket. Adequate positioning was confirmed by fluoroscopy.  Hemostasis was confirmed, and the port was secured with 2-0 prolene sutures.  Good backflow of blood was noted on aspiration of the port. The port was flushed with heparin flush.  Subcutaneous layer was reapproximated using a 3-0 Vicryl interrupted suture. The skin was closed using a 4-0 Vicryl subcuticular suture. Dermabond was applied.  All tape and needle counts were correct at the end of the procedure. The patient was transferred to PACU in stable condition. A chest x-ray will be performed at that time.  Curlene Labrum, MD [facility] [address] [place], [place] [address] [phone] (office)

## 2017-05-20 NOTE — Discharge Instructions (Signed)
Implanted Port Home Guide °An implanted port is a type of central line that is placed under the skin. Central lines are used to provide IV access when treatment or nutrition needs to be given through a person’s veins. Implanted ports are used for long-term IV access. An implanted port may be placed because: °· You need IV medicine that would be irritating to the small veins in your hands or arms. °· You need long-term IV medicines, such as antibiotics. °· You need IV nutrition for a long period. °· You need frequent blood draws for lab tests. °· You need dialysis. ° °Implanted ports are usually placed in the chest area, but they can also be placed in the upper arm, the abdomen, or the leg. An implanted port has two main parts: °· Reservoir. The reservoir is round and will appear as a small, raised area under your skin. The reservoir is the part where a needle is inserted to give medicines or draw blood. °· Catheter. The catheter is a thin, flexible tube that extends from the reservoir. The catheter is placed into a large vein. Medicine that is inserted into the reservoir goes into the catheter and then into the vein. ° °How will I care for my incision site? °Do not get the incision site wet. Bathe or shower as directed by your health care provider. °How is my port accessed? °Special steps must be taken to access the port: °· Before the port is accessed, a numbing cream can be placed on the skin. This helps numb the skin over the port site. °· Your health care provider uses a sterile technique to access the port. °? Your health care provider must put on a mask and sterile gloves. °? The skin over your port is cleaned carefully with an antiseptic and allowed to dry. °? The port is gently pinched between sterile gloves, and a needle is inserted into the port. °· Only "non-coring" port needles should be used to access the port. Once the port is accessed, a blood return should be checked. This helps ensure that the port  is in the vein and is not clogged. °· If your port needs to remain accessed for a constant infusion, a clear (transparent) bandage will be placed over the needle site. The bandage and needle will need to be changed every week, or as directed by your health care provider. °· Keep the bandage covering the needle clean and dry. Do not get it wet. Follow your health care provider’s instructions on how to take a shower or bath while the port is accessed. °· If your port does not need to stay accessed, no bandage is needed over the port. ° °What is flushing? °Flushing helps keep the port from getting clogged. Follow your health care provider’s instructions on how and when to flush the port. Ports are usually flushed with saline solution or a medicine called heparin. The need for flushing will depend on how the port is used. °· If the port is used for intermittent medicines or blood draws, the port will need to be flushed: °? After medicines have been given. °? After blood has been drawn. °? As part of routine maintenance. °· If a constant infusion is running, the port may not need to be flushed. ° °How long will my port stay implanted? °The port can stay in for as long as your health care provider thinks it is needed. When it is time for the port to come out, surgery will be   done to remove it. The procedure is similar to the one performed when the port was put in. °When should I seek immediate medical care? °When you have an implanted port, you should seek immediate medical care if: °· You notice a bad smell coming from the incision site. °· You have swelling, redness, or drainage at the incision site. °· You have more swelling or pain at the port site or the surrounding area. °· You have a fever that is not controlled with medicine. ° °This information is not intended to replace advice given to you by your health care provider. Make sure you discuss any questions you have with your health care provider. °Document  Released: 08/11/2005 Document Revised: 01/17/2016 Document Reviewed: 04/18/2013 °Elsevier Interactive Patient Education © 2017 [employer] ° ° ° °PATIENT INSTRUCTIONS °POST-ANESTHESIA ° °IMMEDIATELY FOLLOWING SURGERY:  Do not drive or operate machinery for the first twenty four hours after surgery.  Do not make any important decisions for twenty four hours after surgery or while taking narcotic pain medications or sedatives.  If you develop intractable nausea and vomiting or a severe headache please notify your doctor immediately. ° °FOLLOW-UP:  Please make an appointment with your surgeon as instructed. You do not need to follow up with anesthesia unless specifically instructed to do so. ° °WOUND CARE INSTRUCTIONS (if applicable):  Keep a dry clean dressing on the anesthesia/puncture wound site if there is drainage.  Once the wound has quit draining you may leave it open to air.  Generally you should leave the bandage intact for twenty four hours unless there is drainage.  If the epidural site drains for more than 36-48 hours please call the anesthesia department. ° °QUESTIONS?:  Please feel free to call your physician or the hospital operator if you have any questions, and they will be happy to assist you.    ° ° ° °

## 2017-05-20 NOTE — Progress Notes (Signed)
Spoke with pt for pre-op call. Pt had surgery today for a portacath insertion. Pt states she is feeling fine this evening. Denies cardiac history, chest pain or sob. Pt denies being diabetic.

## 2017-05-21 ENCOUNTER — Encounter (HOSPITAL_COMMUNITY): Payer: Self-pay | Admitting: Orthopedic Surgery

## 2017-05-21 ENCOUNTER — Ambulatory Visit (HOSPITAL_COMMUNITY): Payer: Self-pay | Admitting: Anesthesiology

## 2017-05-21 ENCOUNTER — Ambulatory Visit (INDEPENDENT_AMBULATORY_CARE_PROVIDER_SITE_OTHER): Payer: Self-pay | Admitting: Orthopaedic Surgery

## 2017-05-21 ENCOUNTER — Encounter (HOSPITAL_COMMUNITY)
Admission: RE | Disposition: A | Discharge status: Home / Self Care | Payer: Self-pay | Source: Ambulatory Visit | Attending: Orthopaedic Surgery

## 2017-05-21 ENCOUNTER — Ambulatory Visit (HOSPITAL_COMMUNITY)
Admission: RE | Admit: 2017-05-21 | Discharge: 2017-05-21 | Disposition: A | Discharge status: Home / Self Care | Payer: Self-pay | Source: Ambulatory Visit | Attending: Orthopaedic Surgery | Admitting: Orthopaedic Surgery

## 2017-05-21 DIAGNOSIS — Z791 Long term (current) use of non-steroidal anti-inflammatories (NSAID): Secondary | ICD-10-CM | POA: Insufficient documentation

## 2017-05-21 DIAGNOSIS — Z79899 Other long term (current) drug therapy: Secondary | ICD-10-CM | POA: Insufficient documentation

## 2017-05-21 DIAGNOSIS — Z87891 Personal history of nicotine dependence: Secondary | ICD-10-CM | POA: Insufficient documentation

## 2017-05-21 DIAGNOSIS — Z79891 Long term (current) use of opiate analgesic: Secondary | ICD-10-CM | POA: Insufficient documentation

## 2017-05-21 DIAGNOSIS — Y838 Other surgical procedures as the cause of abnormal reaction of the patient, or of later complication, without mention of misadventure at the time of the procedure: Secondary | ICD-10-CM | POA: Insufficient documentation

## 2017-05-21 DIAGNOSIS — Y9289 Other specified places as the place of occurrence of the external cause: Secondary | ICD-10-CM | POA: Insufficient documentation

## 2017-05-21 DIAGNOSIS — Z880 Allergy status to penicillin: Secondary | ICD-10-CM | POA: Insufficient documentation

## 2017-05-21 DIAGNOSIS — T8484XA Pain due to internal orthopedic prosthetic devices, implants and grafts, initial encounter: Secondary | ICD-10-CM

## 2017-05-21 HISTORY — PX: HARDWARE REMOVAL: SHX979

## 2017-05-21 HISTORY — DX: Malignant (primary) neoplasm, unspecified: C80.1

## 2017-05-21 SURGERY — REMOVAL, HARDWARE
Anesthesia: General | Site: Ankle | Laterality: Left

## 2017-05-21 MED ORDER — CHLORHEXIDINE GLUCONATE 4 % EX LIQD: 60.0000 mL | Freq: Once | CUTANEOUS | Status: DC

## 2017-05-21 MED ORDER — BUPIVACAINE HCL (PF) 0.5 % IJ SOLN
INTRAMUSCULAR | Status: DC | PRN
  Administered 2017-05-21: 5 mL

## 2017-05-21 MED ORDER — MIDAZOLAM HCL 5 MG/5ML IJ SOLN
INTRAMUSCULAR | Status: DC | PRN
Start: 2017-05-21 — End: 2017-05-21
  Administered 2017-05-21: 2 mg via INTRAVENOUS

## 2017-05-21 MED ORDER — LIDOCAINE HCL 1 % IJ SOLN
INTRAMUSCULAR | Status: DC | PRN
  Administered 2017-05-21: 7 mL

## 2017-05-21 MED ORDER — LACTATED RINGERS IV SOLN
INTRAVENOUS | Status: DC | PRN
  Administered 2017-05-21: 10:00:00 via INTRAVENOUS

## 2017-05-21 MED ORDER — CLINDAMYCIN PHOSPHATE 900 MG/50ML IV SOLN
900.0000 mg | INTRAVENOUS | Status: AC
  Administered 2017-05-21: 900 mg via INTRAVENOUS
  Filled 2017-05-21: qty 50

## 2017-05-21 MED ORDER — LIDOCAINE HCL (CARDIAC) 20 MG/ML IV SOLN
INTRAVENOUS | Status: DC | PRN
  Administered 2017-05-21: 40 mg via INTRAVENOUS

## 2017-05-21 MED ORDER — PROPOFOL 500 MG/50ML IV EMUL
INTRAVENOUS | Status: DC | PRN
  Administered 2017-05-21: 75 ug/kg/min via INTRAVENOUS

## 2017-05-21 MED ORDER — PHENYLEPHRINE HCL 10 MG/ML IJ SOLN
INTRAMUSCULAR | Status: DC | PRN
  Administered 2017-05-21: 80 ug via INTRAVENOUS

## 2017-05-21 MED ORDER — PROPOFOL 10 MG/ML IV BOLUS
INTRAVENOUS | Status: DC | PRN
  Administered 2017-05-21: 20 mg via INTRAVENOUS

## 2017-05-21 MED ORDER — ONDANSETRON HCL 4 MG/2ML IJ SOLN
INTRAMUSCULAR | Status: DC | PRN
  Administered 2017-05-21: 4 mg via INTRAVENOUS

## 2017-05-21 MED ORDER — FENTANYL CITRATE (PF) 100 MCG/2ML IJ SOLN
INTRAMUSCULAR | Status: DC | PRN
  Administered 2017-05-21 (×2): 50 ug via INTRAVENOUS

## 2017-05-21 MED ORDER — PROMETHAZINE HCL 25 MG/ML IJ SOLN: 6.2500 mg | INTRAMUSCULAR | Status: DC | PRN

## 2017-05-21 MED ORDER — FENTANYL CITRATE (PF) 100 MCG/2ML IJ SOLN: 25.0000 ug | INTRAMUSCULAR | Status: DC | PRN

## 2017-05-21 MED ORDER — FENTANYL CITRATE (PF) 250 MCG/5ML IJ SOLN
INTRAMUSCULAR | Status: AC
  Filled 2017-05-21: qty 5

## 2017-05-21 MED ORDER — BUPIVACAINE HCL (PF) 0.5 % IJ SOLN
INTRAMUSCULAR | Status: AC
  Filled 2017-05-21: qty 30

## 2017-05-21 MED ORDER — LIDOCAINE HCL 1 % IJ SOLN
INTRAMUSCULAR | Status: AC
  Filled 2017-05-21: qty 20

## 2017-05-21 MED ORDER — PROPOFOL 10 MG/ML IV BOLUS
INTRAVENOUS | Status: AC
  Filled 2017-05-21: qty 20

## 2017-05-21 MED ORDER — LACTATED RINGERS IV SOLN
INTRAVENOUS | Status: DC
  Administered 2017-05-21: 10:00:00 via INTRAVENOUS

## 2017-05-21 MED ORDER — MIDAZOLAM HCL 2 MG/2ML IJ SOLN
INTRAMUSCULAR | Status: AC
  Filled 2017-05-21: qty 2

## 2017-05-21 SURGICAL SUPPLY — 30 items
BANDAGE ELASTIC 4 VELCRO ST LF (GAUZE/BANDAGES/DRESSINGS) ×2 IMPLANT
COVER SURGICAL LIGHT HANDLE (MISCELLANEOUS) ×3 IMPLANT
DRAPE EXTREMITY T 121X128X90 (DRAPE) ×2 IMPLANT
DRSG EMULSION OIL 3X3 NADH (GAUZE/BANDAGES/DRESSINGS) ×3 IMPLANT
ELECT REM PT RETURN 9FT ADLT (ELECTROSURGICAL) ×3
ELECTRODE REM PT RTRN 9FT ADLT (ELECTROSURGICAL) ×1 IMPLANT
GAUZE SPONGE 4X4 12PLY STRL (GAUZE/BANDAGES/DRESSINGS) ×3 IMPLANT
GAUZE XEROFORM 5X9 LF (GAUZE/BANDAGES/DRESSINGS) ×2 IMPLANT
GLOVE BIO SURGEON STRL SZ8 (GLOVE) ×3 IMPLANT
GLOVE BIOGEL PI IND STRL 8 (GLOVE) ×1 IMPLANT
GLOVE BIOGEL PI INDICATOR 8 (GLOVE) ×2
GLOVE ORTHO TXT STRL SZ7.5 (GLOVE) ×3 IMPLANT
GOWN STRL REUS W/ TWL XL LVL3 (GOWN DISPOSABLE) ×2 IMPLANT
GOWN STRL REUS W/TWL XL LVL3 (GOWN DISPOSABLE) ×9
KIT BASIN OR (CUSTOM PROCEDURE TRAY) ×3 IMPLANT
KIT ROOM TURNOVER OR (KITS) ×3 IMPLANT
NDL HYPO 25GX1X1/2 BEV (NEEDLE) IMPLANT
NEEDLE HYPO 25GX1X1/2 BEV (NEEDLE) ×6 IMPLANT
NS IRRIG 1000ML POUR BTL (IV SOLUTION) ×3 IMPLANT
PACK GENERAL/GYN (CUSTOM PROCEDURE TRAY) ×3 IMPLANT
PAD ARMBOARD 7.5X6 YLW CONV (MISCELLANEOUS) ×6 IMPLANT
PAD CAST 4YDX4 CTTN HI CHSV (CAST SUPPLIES) IMPLANT
PADDING CAST COTTON 4X4 STRL (CAST SUPPLIES) ×6
SUT ETHILON 2 0 FS 18 (SUTURE) ×2 IMPLANT
SUT VIC AB 0 CT1 27 (SUTURE) ×3
SUT VIC AB 0 CT1 27XBRD ANBCTR (SUTURE) IMPLANT
SUT VIC AB 2-0 SH 27 (SUTURE) ×3
SUT VIC AB 2-0 SH 27XBRD (SUTURE) IMPLANT
SYR CONTROL 10ML LL (SYRINGE) ×4 IMPLANT
TOWEL OR 17X26 10 PK STRL BLUE (TOWEL DISPOSABLE) ×3 IMPLANT

## 2017-05-21 NOTE — Discharge Instructions (Signed)
You may now put all of your weight on your left ankle in your walking boot. Leave your current left ankle dressings in place for the next 5 days. Keep your dressing clean and dry. When you remove your dressings in 5 days, you can get your incision wet in the shower daily at that point. In 5 days, place large band-aids over your incision daily after you shower.

## 2017-05-21 NOTE — Anesthesia Preprocedure Evaluation (Addendum)
Anesthesia Evaluation  Patient identified by MRN, date of birth, ID band Patient awake    Reviewed: Allergy & Precautions, NPO status , Patient's Chart, lab work & pertinent test results  Airway Mallampati: I   Neck ROM: Full    Dental  (+) Dental Advisory Given   Pulmonary former smoker,    Pulmonary exam normal breath sounds clear to auscultation       Cardiovascular negative cardio ROS Normal cardiovascular exam Rhythm:Regular Rate:Normal  TTE 04/2017 - normal EF, no RWMA, no valvulopathy   Neuro/Psych negative neurological ROS  negative psych ROS   GI/Hepatic negative GI ROS, Neg liver ROS,   Endo/Other  negative endocrine ROS  Renal/GU negative Renal ROS  negative genitourinary   Musculoskeletal negative musculoskeletal ROS (+)   Abdominal   Peds negative pediatric ROS (+)  Hematology negative hematology ROS (+)   Anesthesia Other Findings Breast cancer  Reproductive/Obstetrics negative OB ROS                            Anesthesia Physical  Anesthesia Plan  ASA: I  Anesthesia Plan: MAC   Post-op Pain Management:    Induction: Intravenous  PONV Risk Score and Plan: 3 and Midazolam, Propofol infusion and Treatment may vary due to age or medical condition  Airway Management Planned: Natural Airway and Nasal Cannula  Additional Equipment: None  Intra-op Plan:   Post-operative Plan:   Informed Consent: I have reviewed the patients History and Physical, chart, labs and discussed the procedure including the risks, benefits and alternatives for the proposed anesthesia with the patient or authorized representative who has indicated his/her understanding and acceptance.   Dental advisory given  Plan Discussed with: CRNA and Surgeon  Anesthesia Plan Comments: (Surgeon plans to infiltrate local at incision sites given how superficial hardware is. If MAC/local inadequate, will  induce GA and place LMA as backup plan)       Anesthesia Quick Evaluation

## 2017-05-21 NOTE — Addendum Note (Signed)
Addendum  created 05/21/17 1232 by Audry Pili, MD   Delete clinical note, Sign clinical note

## 2017-05-21 NOTE — Op Note (Signed)
NAMEMarland Kitchen  Angel Hale, Angel Hale NO.:  [id number]  MEDICAL RECORD NO.:  [id number]  LOCATION:                                 FACILITY:  PHYSICIAN:  Lind Guest. Ninfa Linden, M.D.DATE OF BIRTH:  [birth year]  DATE OF PROCEDURE:  05/21/2017 DATE OF DISCHARGE:                              OPERATIVE REPORT   PREOPERATIVE DIAGNOSIS:  Painful prominent retained hardware, left ankle.  POSTOPERATIVE DIAGNOSIS:  Painful prominent retained hardware, left ankle.  PROCEDURE:  Removal of retained screw, left ankle.  FINDINGS:  Prominent screw, left ankle.  SURGEON:  Lind Guest. Ninfa Linden, M.D.  ASSISTANT:  Erskine Emery, PA-C.  ANESTHESIA: 1. Mask ventilation IV sedation. 2. Local with 1% plain lidocaine, followed by 0.25% plain Marcaine.  BLOOD LOSS:  Minimal.  COMPLICATIONS:  None.  INDICATIONS:  Angel Hale is a 46 year old female, who in July of this year sustained a severe pilon fracture to her left distal tibia.  She had external fixation applied first and then was taken the operating room on March 03, 2017 for open reduction internal fixation of the fracture.  She has significant swelling as well.  We were able to reduce the fracture. Over the last several weeks to months, she has done well and has been healing and the swelling has gone down dramatically.  She is a very thin individual and the most medial screw is certainly prominent and you can fill it through the soft tissues and actually you can see it almost penetrating the soft tissue.  Due to the painful prominence of this hardware and the potential soft tissue compromise, I have recommended she have the screw removed.  The risks and benefits of this were explained to her in detail and she does wish to proceed.  PROCEDURE DESCRIPTION:  After informed consent was obtained, appropriate left ankle was marked.  She was brought to the operating room and placed supine on the operating table.  Her left foot, ankle, and  leg were prepped and draped with DuraPrep and sterile drapes.  A time-out was called and she was identified as correct patient and correct left foot and ankle.  Mask ventilation IV sedation was obtained, and we then placed local lidocaine, followed by local Marcaine around her incision. We then dissected down the plate and found the most medial screw that was prominent and removed the screw easily.  We then irrigated the soft tissue with normal saline solution.  We closed the deep tissue with 0 Vicryl, followed by interrupted 2-0 nylon on the skin.  Xeroform and well-padded sterile dressing were applied.  She was taken to the recovery room in stable condition.  All final counts were correct.  There were no complications noted.  Postoperatively, we will let her weightbear as tolerated.  We will follow up in the office in 2 weeks.     Lind Guest. Ninfa Linden, M.D.   ______________________________ Lind Guest. Ninfa Linden, M.D.    CYB/MEDQ  D:  05/21/2017  T:  05/21/2017  Job:  [id number]

## 2017-05-21 NOTE — Anesthesia Postprocedure Evaluation (Deleted)
Anesthesia Post Note  Patient: Angel Hale  Procedure(s) Performed: Procedure(s) (LRB): REMOVAL OF RETAINED PROMINENT SCREW LEFT ANKLE (Left)     Patient location during evaluation: PACU Anesthesia Type: General Level of consciousness: awake and alert Pain management: pain level controlled Vital Signs Assessment: post-procedure vital signs reviewed and stable Respiratory status: spontaneous breathing, nonlabored ventilation and respiratory function stable Cardiovascular status: stable and blood pressure returned to baseline Postop Assessment: no apparent nausea or vomiting Anesthetic complications: no    Last Vitals:  Vitals:   05/21/17 1215 05/21/17 1224  BP: 99/65 101/69  Pulse: 68 75  Resp: 18 15  Temp:  36.7 C  SpO2: 100% 100%    Last Pain:  Vitals:   05/21/17 1147  TempSrc:   PainSc: 0-No pain    LLE Motor Response: Purposeful movement;Responds to commands (05/21/17 1224) LLE Sensation: Full sensation (05/21/17 1224)          Audry Pili

## 2017-05-21 NOTE — Anesthesia Postprocedure Evaluation (Signed)
Anesthesia Post Note  Patient: Angel Hale  Procedure(s) Performed: Procedure(s) (LRB): REMOVAL OF RETAINED PROMINENT SCREW LEFT ANKLE (Left)     Patient location during evaluation: PACU Anesthesia Type: MAC Level of consciousness: awake and alert Pain management: pain level controlled Vital Signs Assessment: post-procedure vital signs reviewed and stable Respiratory status: spontaneous breathing, nonlabored ventilation and respiratory function stable Cardiovascular status: stable and blood pressure returned to baseline Postop Assessment: no apparent nausea or vomiting Anesthetic complications: no    Last Vitals:  Vitals:   05/21/17 1215 05/21/17 1224  BP: 99/65 101/69  Pulse: 68 75  Resp: 18 15  Temp:  36.7 C  SpO2: 100% 100%    Last Pain:  Vitals:   05/21/17 1147  TempSrc:   PainSc: 0-No pain    LLE Motor Response: Purposeful movement;Responds to commands (05/21/17 1224) LLE Sensation: Full sensation (05/21/17 1224)          Audry Pili

## 2017-05-21 NOTE — Anesthesia Procedure Notes (Signed)
Procedure Name: MAC Date/Time: 05/21/2017 11:08 AM Performed by: Neldon Newport Pre-anesthesia Checklist: Timeout performed, Patient being monitored, Suction available, Emergency Drugs available and Patient identified Oxygen Delivery Method: Simple face mask

## 2017-05-21 NOTE — H&P (Signed)
Angel Hale is an 46 y.o. female.   Chief Complaint:  Painful, prominent hardware left ankle HPI:   46 yo female who had open reduction/internal fixation of a left ankle pilon fracture in July of this year.  Has a screw at her left ankle that is quite prominent and is causing significant soft-tissue issues.  We have recommended screw removal at this standpoint.  Past Medical History:  Diagnosis Date  . Cancer Angel Hale)    breast cancer    Past Surgical History:  Procedure Laterality Date  . BUNIONECTOMY Left 2000  . EXTERNAL FIXATION LEG Left 02/16/2017   Procedure: EXTERNAL FIXATION LEG;  Surgeon: Angel Rossetti, MD;  Location: [facility];  Service: Orthopedics;  Laterality: Left;  . ORIF ANKLE FRACTURE Left 03/03/2017  . ORIF ANKLE FRACTURE Left 03/03/2017   Procedure: OPEN REDUCTION INTERNAL FIXATION (ORIF) LEFT PILON FRACTURE AND LEFT LATERAL MALLEOLUS;  Surgeon: Angel Rossetti, MD;  Location: [facility];  Service: Orthopedics;  Laterality: Left;  . OVARIAN CYST REMOVAL Left 1991  . PORTACATH PLACEMENT    . WISDOM TOOTH EXTRACTION      Family History  Problem Relation Age of Onset  . Colon cancer Father    Social History:  reports that she quit smoking about 3 months ago. Her smoking use included Cigarettes. She has a 10.00 pack-year smoking history. She has never used smokeless tobacco. She reports that she drinks alcohol. She reports that she does not use drugs.  Allergies:  Allergies  Allergen Reactions  . Penicillins Nausea And Vomiting    Has patient had a PCN reaction causing immediate rash, facial/tongue/throat swelling, SOB or lightheadedness with hypotension: No Has patient had a PCN reaction causing severe rash involving mucus membranes or skin necrosis: No Has patient had a PCN reaction that required hospitalization: No Has patient had a PCN reaction occurring within the last 10 years: No If all of the above answers are "NO", then may proceed with  Cephalosporin use.     Medications Prior to Admission  Medication Sig Dispense Refill  . acetaminophen (TYLENOL) 500 MG tablet Take 1,000 mg by mouth every 6 (six) hours as needed (for pain.).    Marland Kitchen Coenzyme Q10 (CO Q-10 PO) Take 1 tablet by mouth daily.    . Cyanocobalamin (B-12 PO) Take 1 tablet by mouth daily.    . Homeopathic Products (FRANKINCENSE UPLIFTING) OIL Take 1 capsule by mouth daily as needed (for home remedy).    Marland Kitchen HYDROcodone-acetaminophen (NORCO) 5-325 MG tablet Take 1 tablet by mouth every 4 (four) hours as needed for moderate pain. 6 tablet 0  . LAVENDER OIL PO Take 1 capsule by mouth daily as needed (for home remedy).    . methocarbamol (ROBAXIN) 500 MG tablet Take 1 tablet (500 mg total) by mouth every 6 (six) hours as needed for muscle spasms. (Patient not taking: Reported on 05/15/2017) 40 tablet 1  . Multiple Vitamins-Minerals (ZINC PO) Take 1 capsule by mouth daily.    Marland Kitchen sulfamethoxazole-trimethoprim (BACTRIM DS,SEPTRA DS) 800-160 MG tablet Take 2 tablets by mouth 2 (two) times daily.    . vitamin C (ASCORBIC ACID) 500 MG tablet Take 1 tablet (500 mg total) by mouth daily. 90 tablet 0    Results for orders placed or performed during the Hale encounter of 05/20/17 (from the past 48 hour(s))  hCG, serum, qualitative     Status: None   Collection Time: 05/20/17 10:31 AM  Result Value Ref Range   Preg, Serum  NEGATIVE NEGATIVE    Comment:        THE SENSITIVITY OF THIS METHODOLOGY IS >10 mIU/mL.    Dg Chest Port 1 View  Result Date: 05/20/2017 CLINICAL DATA:  Port-A-Cath placement. EXAM: PORTABLE CHEST 1 VIEW COMPARISON:  Chest CT May 15, 2017 FINDINGS: Single-lumen LEFT chest Port-A-Cath distal tip projects in mid superior vena cava. Cardiomediastinal silhouette is normal. No pleural effusions or focal consolidations. Trachea projects midline and there is no pneumothorax. Soft tissue planes and included osseous structures are non-suspicious. IMPRESSION: LEFT  single-lumen chest Port-A-Cath distal tip projects in mid superior vena cava. No pneumothorax or acute cardiopulmonary process. Electronically Signed   By: Angel Hale M.D.   On: 05/20/2017 14:51   Dg C-arm 1-60 Min-no Report  Result Date: 05/20/2017 Fluoroscopy was utilized by the requesting physician.  No radiographic interpretation.    ROS  Last menstrual period 05/12/2017. Physical Exam  Constitutional: She is oriented to person, place, and time. She appears well-developed and well-nourished.  HENT:  Head: Normocephalic and atraumatic.  Eyes: Pupils are equal, round, and reactive to light.  Neck: Normal range of motion. Neck supple.  Cardiovascular: Normal rate and regular rhythm.   Respiratory: Effort normal and breath sounds normal.  GI: Soft. Bowel sounds are normal.  Musculoskeletal:       Feet:  Neurological: She is alert and oriented to person, place, and time.  Skin: Skin is warm and dry.  Psychiatric: She has a normal mood and affect.     Assessment/Plan Pain, prominent retained hardware left ankle with soft-tissue compromise.  1)  To the OR today as an outpatient for removal of a single screw from her left distal tibia/ankle. Risks and benefits have been discussed and informed consent obtained.  Angel Rossetti, MD 05/21/2017, 9:00 AM

## 2017-05-21 NOTE — Op Note (Deleted)
  The note originally documented on this encounter has been moved the the encounter in which it belongs.  

## 2017-05-21 NOTE — Transfer of Care (Signed)
Immediate Anesthesia Transfer of Care Note  Patient: Angel Hale  Procedure(s) Performed: Procedure(s): REMOVAL OF RETAINED PROMINENT SCREW LEFT ANKLE (Left)  Patient Location: PACU  Anesthesia Type:MAC  Level of Consciousness: awake, alert  and oriented  Airway & Oxygen Therapy: Patient Spontanous Breathing  Post-op Assessment: Report given to RN, Post -op Vital signs reviewed and stable and Patient moving all extremities X 4  Post vital signs: Reviewed and stable  Last Vitals:  Vitals:   05/21/17 0901  BP: 112/76  Pulse: 67  Resp: 18  Temp: 36.9 C  SpO2: 100%    Last Pain:  Vitals:   05/21/17 0901  TempSrc: Oral         Complications: No apparent anesthesia complications

## 2017-05-21 NOTE — Brief Op Note (Signed)
05/21/2017  11:42 AM  PATIENT:  Angel Hale  46 y.o. female  PRE-OPERATIVE DIAGNOSIS:  retained hardware left ankle  POST-OPERATIVE DIAGNOSIS:  retained hardware left ankle  PROCEDURE:  Procedure(s): REMOVAL OF RETAINED PROMINENT SCREW LEFT ANKLE (Left)  SURGEON:  Surgeon(s) and Role:    Mcarthur Rossetti, MD - Primary  PHYSICIAN ASSISTANT: Benita Stabile, PA-C  ANESTHESIA:   local and IV sedation  EBL:  Total I/O In: 306.5 [I.V.:306.5] Out: 20 [Blood:20]  COUNTS:  YES  TOURNIQUET:  None  DICTATION: .Other Dictation: Dictation Number [id number]  PLAN OF CARE: Discharge to home after PACU  PATIENT DISPOSITION:  PACU - hemodynamically stable.   Delay start of Pharmacological VTE agent (>24hrs) due to surgical blood loss or risk of bleeding: no

## 2017-05-22 ENCOUNTER — Encounter (HOSPITAL_COMMUNITY): Payer: Self-pay | Admitting: Orthopaedic Surgery

## 2017-05-25 ENCOUNTER — Encounter (HOSPITAL_COMMUNITY): Payer: Self-pay | Attending: Oncology | Admitting: Oncology

## 2017-05-25 ENCOUNTER — Encounter (HOSPITAL_COMMUNITY): Payer: Self-pay

## 2017-05-25 VITALS — BP 106/67 | HR 74 | Temp 98.4°F | Resp 18 | Wt 120.3 lb

## 2017-05-25 DIAGNOSIS — C773 Secondary and unspecified malignant neoplasm of axilla and upper limb lymph nodes: Secondary | ICD-10-CM | POA: Insufficient documentation

## 2017-05-25 DIAGNOSIS — C50811 Malignant neoplasm of overlapping sites of right female breast: Secondary | ICD-10-CM | POA: Insufficient documentation

## 2017-05-25 DIAGNOSIS — Z170 Estrogen receptor positive status [ER+]: Secondary | ICD-10-CM | POA: Insufficient documentation

## 2017-05-25 MED ORDER — PROCHLORPERAZINE MALEATE 10 MG PO TABS: 10.0000 mg | ORAL_TABLET | Freq: Four times a day (QID) | ORAL | 1 refills | Status: DC | PRN

## 2017-05-25 MED ORDER — LORAZEPAM 0.5 MG PO TABS: 0.5000 mg | ORAL_TABLET | Freq: Four times a day (QID) | ORAL | 0 refills | Status: DC | PRN

## 2017-05-25 MED ORDER — LIDOCAINE-PRILOCAINE 2.5-2.5 % EX CREA: TOPICAL_CREAM | CUTANEOUS | 3 refills | Status: DC

## 2017-05-25 MED ORDER — ONDANSETRON HCL 8 MG PO TABS: 8.0000 mg | ORAL_TABLET | Freq: Two times a day (BID) | ORAL | 1 refills | Status: DC | PRN

## 2017-05-25 MED ORDER — DEXAMETHASONE 4 MG PO TABS: 8.0000 mg | ORAL_TABLET | Freq: Two times a day (BID) | ORAL | 1 refills | Status: DC

## 2017-05-25 NOTE — Progress Notes (Signed)
Went to speak with pt about upcoming chemotherapy appt.  Appt made for first chemo.  Information given on nutrition, chemotherapy in general , and the chemotherapy drugs she will be getting.  All questions answered.

## 2017-05-25 NOTE — Progress Notes (Signed)
[facility] Initial Visit:  Patient Care Team: Patient, No Pcp Per as PCP - General (General Practice)  CHIEF COMPLAINTS/PURPOSE OF CONSULTATION:  Right multifocal breast cancer  HISTORY OF PRESENTING ILLNESS: Angel Hale 46 y.o.premenopausal female presents today for evaluation of high-grade right breast cancer. Patient states that 2 years ago she initially noted a mass in her right breast, however patient thought that it was a cyst and did not get any evaluation done. Her symptoms have worsened over time and she now has an open lesion which is draining in the inferior right breast along the inframammary fold as well as multiple masses in her breast. Patient is uninsured and does not have a PCP, so she was not receiving regular care.   She underwent a bilateral diagnostic mammogram with ultrasound on 05/12/2017 which demonstrated locally advanced multicentric neglected right breast cancer with a fungating mass along the inframammary fold and diffuse skin thickening. Right axillary and right infraclavicular pathologic lymphadenopathy.   Patient underwent ultrasound-guided biopsy on 05/12/2017 of the right medial lower inner quadrant breast which demonstrated invasive ductal carcinoma grade 3, biopsy of right upper outer quadrant breast demonstrated invasive ductal carcinoma grade 3, biopsy of right axillary lymph node demonstrated lymph node with metastatic carcinoma.  Staging bone scan 05/15/2017 demonstrated no evidence of metastatic disease.   Staging CT chest/abdomen/pelvis 05/15/17 demonstrated numerous right breast masses and metastatic right axillary lymphadenopathy. Findings suspicious for chest wall invasion along the lower lateral aspect of the breast. Two small indeterminate right lower lobe pulmonary nodules will require surveillance. No findings for metastatic disease involving the abdomen/pelvis or osseous structures.   Tumor markers: CA 15-3 on 05/14/2017  was 115.9. CA-27-29 on 05/14/2017 was 157.7.   She is currently on Bactrim for the open wound on her right breast.  Patient's case is complicated in that she had a horseback riding accident in July and underwent an OPEN REDUCTION INTERNAL FIXATION (ORIF) LEFT PILON FRACTURE AND LEFT LATERAL MALLEOLUS on 03/03/17.  However since then, she's had complications with a screw sticking out of her foot. She is seeing her orthopedic surgeon this upcoming Thursday to see one when she will have surgery to get this fixed.  INTERVAL HISTORY: Patient presents today for continued follow-up to discuss her treatment plan. 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 Since her last visit she's had removal of her protruding hardware in her left ankle on 05/21/2017. She stated the procedure went well and she is not noted any bleeding. Patient also had a left chest wall chemotherapy port placed on 05/20/2017. ECHO on 05/18/17 showed an EF of 60-65%.  She has no new complaints today.  Overall patient states that she feels well. She denies any chest pain, shortness breath, abdominal pain, weight loss.  Review of Systems - Oncology ROS as per HPI otherwise 12 point ROS is negative.  MEDICAL HISTORY: Past Medical History:  Diagnosis Date  . Cancer [facility])    breast cancer    SURGICAL HISTORY: Past Surgical History:  Procedure Laterality Date  . BUNIONECTOMY Left 2000  . EXTERNAL FIXATION LEG Left 02/16/2017   Procedure: EXTERNAL FIXATION LEG;  Surgeon: Mcarthur Rossetti, MD;  Location: [facility];  Service: Orthopedics;  Laterality: Left;  . HARDWARE REMOVAL Left 05/21/2017   Procedure: REMOVAL OF RETAINED PROMINENT SCREW LEFT ANKLE;  Surgeon: Mcarthur Rossetti, MD;  Location: [place];  Service: Orthopedics;  Laterality: Left;  . ORIF ANKLE FRACTURE Left 03/03/2017  .  ORIF ANKLE FRACTURE Left 03/03/2017   Procedure: OPEN REDUCTION INTERNAL FIXATION (ORIF) LEFT PILON FRACTURE AND LEFT  LATERAL MALLEOLUS;  Surgeon: Mcarthur Rossetti, MD;  Location: [place];  Service: Orthopedics;  Laterality: Left;  . OVARIAN CYST REMOVAL Left 1991  . PORTACATH PLACEMENT    . PORTACATH PLACEMENT Left 05/20/2017   Procedure: INSERTION PORT-A-CATH;  Surgeon: Virl Cagey, MD;  Location: [facility];  Service: General;  Laterality: Left;  . WISDOM TOOTH EXTRACTION      SOCIAL HISTORY: Social History   Social History  . Marital status: Married    Spouse name: N/A  . Number of children: N/A  . Years of education: N/A   Occupational History  . Not on file.   Social History Main Topics  . Smoking status: Former Smoker    Packs/day: 1.00    Years: 10.00    Types: Cigarettes    Quit date: 02/16/2017  . Smokeless tobacco: Never Used  . Alcohol use Yes     Comment: occasional  . Drug use: No  . Sexual activity: Not Currently    Birth control/ protection: None   Other Topics Concern  . Not on file   Social History Narrative  . No narrative on file    FAMILY HISTORY Family History  Problem Relation Age of Onset  . Colon cancer Father     ALLERGIES:  is allergic to penicillins.  MEDICATIONS:  Current Outpatient Prescriptions  Medication Sig Dispense Refill  . acetaminophen (TYLENOL) 500 MG tablet Take 1,000 mg by mouth every 6 (six) hours as needed (for pain.).    Marland Kitchen Coenzyme Q10 (CO Q-10 PO) Take 1 tablet by mouth daily.    . Cyanocobalamin (B-12 PO) Take 1 tablet by mouth daily.    Marland Kitchen dexamethasone (DECADRON) 4 MG tablet Take 2 tablets (8 mg total) by mouth 2 (two) times daily. Start the day before Taxotere. Then again the day after chemo for 3 days. 30 tablet 1  . Homeopathic Products (FRANKINCENSE UPLIFTING) OIL Take 1 capsule by mouth daily as needed (for home remedy).    Marland Kitchen HYDROcodone-acetaminophen (NORCO) 5-325 MG tablet Take 1 tablet by mouth every 4 (four) hours as needed for moderate pain. 6 tablet 0  . LAVENDER OIL PO Take 1 capsule by mouth daily as needed  (for home remedy).    Marland Kitchen lidocaine-prilocaine (EMLA) cream Apply to affected area once 30 g 3  . LORazepam (ATIVAN) 0.5 MG tablet Take 1 tablet (0.5 mg total) by mouth every 6 (six) hours as needed (Nausea or vomiting). 30 tablet 0  . methocarbamol (ROBAXIN) 500 MG tablet Take 1 tablet (500 mg total) by mouth every 6 (six) hours as needed for muscle spasms. (Patient not taking: Reported on 05/15/2017) 40 tablet 1  . Multiple Vitamins-Minerals (ZINC PO) Take 1 capsule by mouth daily.    . ondansetron (ZOFRAN) 8 MG tablet Take 1 tablet (8 mg total) by mouth 2 (two) times daily as needed for refractory nausea / vomiting. Start on day 3 after chemo. 30 tablet 1  . prochlorperazine (COMPAZINE) 10 MG tablet Take 1 tablet (10 mg total) by mouth every 6 (six) hours as needed (Nausea or vomiting). 30 tablet 1  . sulfamethoxazole-trimethoprim (BACTRIM DS,SEPTRA DS) 800-160 MG tablet Take 2 tablets by mouth 2 (two) times daily.    . vitamin C (ASCORBIC ACID) 500 MG tablet Take 1 tablet (500 mg total) by mouth daily. 90 tablet 0   No current facility-administered medications  for this visit.     PHYSICAL EXAMINATION:  ECOG PERFORMANCE STATUS: 0 - Asymptomatic   Vitals:   05/25/17 1425  BP: 106/67  Pulse: 74  Resp: 18  Temp: 98.4 F (36.9 C)  SpO2: 98%    Filed Weights   05/25/17 1425  Weight: 120 lb 4.8 oz (54.6 kg)     Physical Exam Constitutional: Well-developed, well-nourished, and in no distress.   HENT:  Head: Normocephalic and atraumatic.  Mouth/Throat: No oropharyngeal exudate. Mucosa moist. Eyes: Pupils are equal, round, and reactive to light. Conjunctivae are normal. No scleral icterus.  Neck: Normal range of motion. Neck supple. No JVD present.  Cardiovascular: Normal rate, regular rhythm and normal heart sounds.  Exam reveals no gallop and no friction rub.   No murmur heard. Pulmonary/Chest: Effort normal and breath sounds normal. No respiratory distress. No wheezes.No rales.   Abdominal: Soft. Bowel sounds are normal. No distension. There is no tenderness. There is no guarding.  Musculoskeletal: No edema or tenderness.  Lymphadenopathy:    No cervical or supraclavicular adenopathy.  Neurological: Alert and oriented to person, place, and time. No cranial nerve deficit.  Skin: Skin is warm and dry. No rash noted. No erythema. No pallor.  Psychiatric: Affect and judgment normal.  BREAST: right breast: peau d'orange, ecchymosis at biopsy sites, firm mass palpated on the RUO quadrant of her breast, breast is firm in the RLQ with an open wound in the 6 o'clock position measuring 2 cm. +Right axillary lymphadenopathy. Left breast without masses, skin changes, nipple discharge, or axillary lymphadenopathy.  LABORATORY DATA: I have personally reviewed the data as listed:  Admission on 05/20/2017, Discharged on 05/20/2017  Component Date Value Ref Range Status  . Preg, Serum 05/20/2017 NEGATIVE  NEGATIVE Final   Comment:        THE SENSITIVITY OF THIS METHODOLOGY IS >10 mIU/mL.   Hospital Outpatient Visit on 05/18/2017  Component Date Value Ref Range Status  . Weight 05/18/2017 1920  oz Final  . Height 05/18/2017 65  in Final  . BP 05/18/2017 107/63  mmHg Final  Appointment on 05/14/2017  Component Date Value Ref Range Status  . WBC 05/14/2017 9.4  4.0 - 10.5 K/uL Final  . RBC 05/14/2017 4.39  3.87 - 5.11 MIL/uL Final  . Hemoglobin 05/14/2017 14.7  12.0 - 15.0 g/dL Final  . HCT 05/14/2017 42.5  36.0 - 46.0 % Final  . MCV 05/14/2017 96.8  78.0 - 100.0 fL Final  . MCH 05/14/2017 33.5  26.0 - 34.0 pg Final  . MCHC 05/14/2017 34.6  30.0 - 36.0 g/dL Final  . RDW 05/14/2017 13.2  11.5 - 15.5 % Final  . Platelets 05/14/2017 335  150 - 400 K/uL Final  . Neutrophils Relative % 05/14/2017 82  % Final  . Neutro Abs 05/14/2017 7.7  1.7 - 7.7 K/uL Final  . Lymphocytes Relative 05/14/2017 13  % Final  . Lymphs Abs 05/14/2017 1.2  0.7 - 4.0 K/uL Final  . Monocytes  Relative 05/14/2017 5  % Final  . Monocytes Absolute 05/14/2017 0.5  0.1 - 1.0 K/uL Final  . Eosinophils Relative 05/14/2017 0  % Final  . Eosinophils Absolute 05/14/2017 0.0  0.0 - 0.7 K/uL Final  . Basophils Relative 05/14/2017 0  % Final  . Basophils Absolute 05/14/2017 0.0  0.0 - 0.1 K/uL Final  . Sodium 05/14/2017 138  135 - 145 mmol/L Final  . Potassium 05/14/2017 4.1  3.5 - 5.1 mmol/L Final  .  Chloride 05/14/2017 102  101 - 111 mmol/L Final  . CO2 05/14/2017 27  22 - 32 mmol/L Final  . Glucose, Bld 05/14/2017 93  65 - 99 mg/dL Final  . BUN 05/14/2017 8  6 - 20 mg/dL Final  . Creatinine, Ser 05/14/2017 0.80  0.44 - 1.00 mg/dL Final  . Calcium 05/14/2017 9.5  8.9 - 10.3 mg/dL Final  . Total Protein 05/14/2017 7.6  6.5 - 8.1 g/dL Final  . Albumin 05/14/2017 4.3  3.5 - 5.0 g/dL Final  . AST 05/14/2017 18  15 - 41 U/L Final  . ALT 05/14/2017 14  14 - 54 U/L Final  . Alkaline Phosphatase 05/14/2017 61  38 - 126 U/L Final  . Total Bilirubin 05/14/2017 0.4  0.3 - 1.2 mg/dL Final  . GFR calc non Af Amer 05/14/2017 >60  >60 mL/min Final  . GFR calc Af Amer 05/14/2017 >60  >60 mL/min Final   Comment: (NOTE) The eGFR has been calculated using the CKD EPI equation. This calculation has not been validated in all clinical situations. eGFR's persistently <60 mL/min signify possible Chronic Kidney Disease.   . Anion gap 05/14/2017 9  5 - 15 Final  . CA 15-3 05/14/2017 115.9* 0.0 - 25.0 U/mL Final   Comment: (NOTE) Roche ECLIA  methodology Performed At: [facility] [place], [place] [id number] Lindon Romp MD [place]:[id number]   . CA 27.29 05/14/2017 157.7* 0.0 - 38.6 U/mL Final   Comment: (NOTE) Bayer Centaur/ACS methodology Performed At: [facility] [place], [place] [id number] Lindon Romp MD [place]:[id number]     RADIOGRAPHIC STUDIES: I have personally reviewed the radiological images as listed and agree with the findings in the  report  No results found. PATHOLOGY: Diagnosis 1. Breast, right, needle core biopsy, medial lower inner quadrant - INVASIVE DUCTAL CARCINOMA, GRADE 3. - SEE MICROSCOPIC DESCRIPTION. 2. Breast, right, needle core biopsy, upper outer quadrant - INVASIVE DUCTAL CARCINOMA, GRADE 3. - SEE MICROSCOPIC DESCRIPTION. 3. Lymph node, needle/core biopsy, right axillary - LYMPH NODE WITH METASTATIC CARCINOMA. Microscopic Comment 1. -3. Breast prognostic profile will be performed. Called to Dr. Radford Pax on 05/14/17. (JDP:gt, 05/14/17)  ASSESSMENT/PLAN: Cancer Staging Malignant neoplasm of overlapping sites of right female breast [facility]) Staging form: Breast, AJCC 8th Edition - Clinical: No Stage Recommended (ycT4b, cN3a, cM0, G3, ER: Positive, PR: Positive, HER2: Positive) - Signed by Twana First, MD on 05/25/2017  PLAN: -Reviewed her Hormone Profile in detail with the patient today. She is triple positive. -Discussed plans to start neoadjuvant chemotherapy with TCH-P q21 days for 6 cycles in detail with the patient. Side effects were discussed in detail and reading material on side effects was provided to the patient. She is meeting with our nurse navigator Joanne Gavel today as well.  -She will need to complete a full year of HER2 targeted therapy with herceptin + perjeta. This was discussed with the patient.  -She will get routine monitoring of her EF with ECHOs q3 months. Her baseline EF is 60-65%. -Tentatively plan to start chemo this week. -RTC in 10 days after starting chemo for nadir check and to assess tolerability to chemo.  All questions were answered. The patient knows to call the clinic with any problems, questions or concerns.  This note was electronically signed.    Twana First, MD  05/25/2017 3:08 PM

## 2017-05-25 NOTE — Patient Instructions (Signed)
[facility] at [facility] Discharge Instructions  RECOMMENDATIONS MADE BY THE CONSULTANT AND ANY TEST RESULTS WILL BE SENT TO YOUR REFERRING PHYSICIAN.  You were seen by Dr. Zhou today  Thank you for choosing [facility] at [facility] to provide your oncology and hematology care.  To afford each patient quality time with our provider, please arrive at least 15 minutes before your scheduled appointment time.    If you have a lab appointment with the Cancer Center please come in thru the  Main Entrance and check in at the main information desk  You need to re-schedule your appointment should you arrive 10 or more minutes late.  We strive to give you quality time with our providers, and arriving late affects you and other patients whose appointments are after yours.  Also, if you no show three or more times for appointments you may be dismissed from the clinic at the providers discretion.     Again, thank you for choosing [facility].  Our hope is that these requests will decrease the amount of time that you wait before being seen by our physicians.       _____________________________________________________________  Should you have questions after your visit to [facility], please contact our office at [phone] between the hours of 8:30 a.m. and 4:30 p.m.  Voicemails left after 4:30 p.m. will not be returned until the following business day.  For prescription refill requests, have your pharmacy contact our office.       Resources For Cancer Patients and their Caregivers ? American Cancer Society: Can assist with transportation, wigs, general needs, runs Look Good Feel Better.        1-888-227-6333 ? Cancer Care: Provides financial assistance, online support groups, medication/co-pay assistance.  1-800-813-HOPE (4673) ? [facility] Assists [place] cancer patients and their families  through emotional , educational and financial support.  336-427-4357 ? [place] DSS Where to apply for food stamps, Medicaid and utility assistance. 336-342-1394 ? [facility]: Transportation to medical appointments. 336-347-2287 ? Social Security Administration: May apply for disability if have a Stage IV cancer. 336-342-7796 1-800-772-1213 ? [place] Aging, Disability and Transit Services: Assists with nutrition, care and transit needs. 336-349-2343  Cancer Center Support Programs: @10RELATIVEDAYS@ > Cancer Support Group  2nd Tuesday of the month 1pm-2pm, Journey Room  > Creative Journey  3rd Tuesday of the month 1130am-1pm, Journey Room  > Look Good Feel Better  1st Wednesday of the month 10am-12 noon, Journey Room (Call American Cancer Society to register 1-800-395-5775)   

## 2017-05-26 ENCOUNTER — Telehealth (HOSPITAL_COMMUNITY): Payer: Self-pay | Admitting: Oncology

## 2017-05-26 ENCOUNTER — Encounter (HOSPITAL_COMMUNITY): Payer: Self-pay | Admitting: Emergency Medicine

## 2017-05-26 NOTE — Patient Instructions (Signed)
Angel Hale   CHEMOTHERAPY INSTRUCTIONS  You have been diagnosed with Stage 3 Breast Cancer.  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  We are going to treat you with taxotere, carboplatin, herceptin, and perjeta for 6 cycles.  Then you will have herceptin and perjeta for 1 year.  1 cycle is every 3 weeks.  This treatment is with curative intent.   You will see the doctor regularly throughout treatment.  We monitor your lab work prior to every treatment.  The doctor monitors your response to treatment by the way you are feeling, your blood work, and scans periodically.  There will be wait times while you are here for treatment.  It will take lab work 30 minutes to 1 hour to result.  There will be wait times while pharmacy mixes your medications.     You will have the following pre-medications prior to each chemo: Premeds: Aloxi - high powered nausea/vomiting prevention medication used for chemotherapy patients.  Dexamethasone - steroid - given to reduce the risk of you having an allergic type reaction to the chemotherapy. Dex can cause you to feel energized, nervous/anxious/jittery, make you have trouble sleeping, and/or make you feel hot/flushed in the face/neck and/or look pink/red in the face/neck. These side effects will pass as the Dex wears off. (takes 20 minutes to infuse) Tylenol and Benadryl: Help prevents reaction to the chemotherapy.   You will have neulasta after each chemo: Neulasta - this medication is not chemo but being given because you have had chemo. It is usually given 24-27 hours after the completion of chemotherapy. This medication works by boosting your bone marrow's supply of white blood cells. White blood cells are what protect our bodies against infection. The medication is given in the form of a subcutaneous injection. It is given in the fatty tissue of your abdomen. It is a short needle. The major side effect of this medication is bone or  muscle pain. The drug of choice to relieve or lessen the pain is Aleve or Ibuprofen. If a physician has ever told you not to take Aleve or Ibuprofen - then don't take it. You should then take Tylenol/acetaminophen. Take either medication as the bottle directs you to.  The level of pain you experience as a result of this injection can range from none, to mild or moderate, or severe. Please let us know if you develop moderate or severe bone pain.   You can take Claritin 10 mg over the counter for a few days after receiving neulasta to help with the bone aches and pains.    **DO NOT expose the Neulasta On-body injector to diagnostic imaging (CT scans, MRI, Ultrasound, X-ray), radiation treatment, or oxygen rich environments, such as hyperbaric chambers. **   POTENTIAL SIDE EFFECTS OF TREATMENT:  Docetaxel (Generic Name) Other Names: Taxotere  About This Drug Docetaxel is used to treat cancer. This drug is given in the vein (IV).  This will take 1 hour to infuse.  The first infusion will take longer because we increase the drug slowly to monitor for reactions.    Possible Side Effects (More Common) . Bone marrow depression. This is a decrease in the number of white blood cells, red blood cells, and platelets. This may raise your risk of infection, make you tired and weak (fatigue), and raise your risk of bleeding. . Swelling of your legs, ankles, and/or feet. Steroids are often given to lessen this swelling. . Hair loss: Hair  loss is often complete scalp hair loss and can involve loss of eyebrows, eyelashes, and pubic hair. You may notice this a few days or weeks after treatment has started. There have been cases of permanent hair loss reported. . Loose bowel movements (diarrhea) that may last for a few days. . Nausea and throwing up (vomiting). These symptoms may happen within a few hours after your treatment and may last up to 24 hours. Medicines are available to stop or lessen these side effects. .  Soreness of the mouth and throat. You may have red areas, white patches, or sores that hurt. . Effects on the nerves are called peripheral neuropathy. You may feel numbness, tingling, or pain in your hands and feet. It may be hard for you to button your clothes, open jars, or walk as usual. The effect on the nerves may get worse with more doses of the drug. These effects get better in some people after the drug is stopped but it does not get better in all people. . Skin and nail changes. You may develop a rash or have skin redness and swelling followed by peeling. Nail problems may occur such as changes in nail color, nails becoming thin or brittle, or loss of the nail. Steroids are often given to lessen these side effects. . Weakness that interferes with your daily activities. . This drug contains alcohol and may affect your central nervous system. The central nervous system is made up of your brain and spinal cord. You may feel drunk during and after your treatment and it can impair your ability to drive or use machinery for one to two hours after infusion.  Possible Side Effects (Less Common) . Skin and tissue irritation may involve redness, pain, warmth, or swelling at the IV site. This happens if the drug leaks out of the vein and into nearby tissue. . Changes in your liver function. Your doctor will check your liver function as needed. . Muscle and joint pain. . Effects on the heart: This drug can weaken the heart and lower heart function. Your heart function will be checked as needed. You may have trouble catching your breath, mainly during activities. You may also have trouble breathing while lying down, and have swelling in your ankles. . This drug may cause an increased risk of developing a second cancer. . Skin and tissue irritation may involve redness, pain, warmth, or swelling at the IV site. This happens if the drug leaks out of the vein and into nearby tissue. Marland Kitchen Blurred vision or other  changes in eyesight. . A rare risk of death is increased in people with liver problems. Do not take this drug if you have liver disease and talk to your doctor.  Allergic Reactions Serious allergic reactions, including anaphylaxis are rare. While you are getting this drug in your vein (IV), tell your nurse right away if you have any of these symptoms of an allergic reaction: . Trouble catching your breath . Feeling like your tongue or throat are swelling . Feeling your heart beat quickly or in a not normal way (palpitations) . Feeling dizzy or lightheaded . Flushing, itching, rash, and/or hives  Infusion Reactions While you are getting this drug in your vein (IV), you may have a reaction. Your nurse will check you closely for these signs: fever or shaking chills, flushing, facial swelling, feeling dizzy, headache, trouble breathing, rash, itching, chest tightness, or chest pain. Less serious reactions to this drug may also happen. You will be  given medicines to help stop or lessen these symptoms. Your vital signs will be checked during the infusion. Tell your doctor or nurse right away if you have any of these symptoms at any time during the infusion and/or for the first 24 hours after getting this drug: . Fever, chills, or shaking chills . Feeling dizzy or lightheaded . Headache . Nausea or throwing up  Treating Side Effects . Drink 6-8 cups of fluids every day unless your doctor has told you to limit your fluid intake due to some other health problem. A cup is 8 ounces of fluid. If you throw up or have loose bowel movements, you should drink more fluids so that you do not become dehydrated (lack water in the body due to losing too much fluid). . Talk with your nurse about getting a wig before you lose your hair. Also, call the [facility] at 800-ACS-2345 to find out information about the " Look Good,Feel Better" program close to where you live. It is a free program where women  undergoing chemotherapy can learn about wigs, turbans and scarves as well as makeup techniques and skin and nail care. . Do not put anything on a rash unless your doctor or nurse says you may. Keep the area around the rash clean and dry. Ask your doctor for medicine if your rash bothers you. . Mouth care is very important. Your mouth care should consist of routine, gentle cleaning of your teeth or dentures and rinsing your mouth with a mixture of 1/2 teaspoon of salt in 8 ounces of water or  teaspoon of baking soda in 8 ounces of water. This should be done at least after each meal and at bedtime. . If you have mouth sores, avoid mouthwash that has alcohol. Avoid alcohol and smoking because they can bother your mouth and throat. . Ask your doctor or nurse about medicine to stop or lessen loose bowel movements, nausea, throwing up, and joint and muscle pain. . If you have numbness and tingling in your hands and feet, be careful when cooking, walking, and handling sharp objects and hot liquids.  Food and Drug Interactions There are no known interactions of docetaxel with food. This drug may interact with other medicines. Tell your doctor and pharmacist about all the medicines and dietary supplements (vitamins, minerals, herbs and others) that you are taking at this time. The safety and use of dietary supplements and alternative diets are often not known. Using these might affect your cancer or interfere with your treatment. Until more is known, you should not use dietary supplements or alternative diets without your cancer doctor's help.  When to Call the Doctor Call your doctor or nurse right away if you have any of these symptoms: . Fever of 100.5 F (38 C) or above . Chills . Easy bruising or bleeding . Wheezing or trouble breathing . Rash or itching . Feeling dizzy or lightheaded . Loose bowel movements (diarrhea) more than 4 times a day or diarrhea with weakness or feeling lightheaded . Nausea  that stops you from eating or drinking . Throwing up more than 3 times a day . Signs of liver problems: dark urine, pale bowel movements, bad stomach pain, feeling very tired and weak, unusual itching, or yellowing of the eyes or skin . Eye irritation, blurred vision or other changes in eyesight Call your doctor or nurse as soon as possible if any of these symptoms happen: . Decreased urine . Pain in your mouth or  throat that makes it hard to eat or drink . Nausea that is not relieved by prescribed medicines . Rash that is not relieved by prescribed medicines . Numbness, tingling, decreased feeling or weakness in fingers, toes, arms, or legs . Trouble walking or changes in the way you walk, feeling clumsy when buttoning clothes, opening jars, or other routine hand motions . Swelling of legs, ankles, or feet . Weight gain of 5 pounds in one week (fluid retention) . Fatigue that interferes with your daily activities . Headache that does not go away . Extreme weakness that interferes with normal activities . While you are getting this drug, please tell your nurse right away if you have any pain, redness, or swelling at the site of the IV infusion . Symptoms of being drunk, confusion, or being very sleepy  Sexual Problems and Reproductive Concerns . Pregnancy warning: This drug may have harmful effects on the unborn child, so effective methods of birth control should be used during your cancer treatment. Genetic counseling is available for you to talk about the effects of this drug therapy on future pregnancies. Also, a genetic counselor can look at the possible risk of problems in the unborn baby due to this medicine if an exposure happens during pregnancy. . Breast feeding warning: It is not known if this drug passes into breast milk. For this reason, women should talk to their doctor about the risks and benefits of breast feeding during treatment with this drug because this drug may enter the  breast milk and badly harm a breast feeding baby.  Carboplatin (Generic Name) Other Names: Paraplatin, CBDCA  About This Drug Carboplatin is a drug used to treat cancer. This drug is given in the vein (IV). This drug takes about 30 minutes to infuse.   Possible Side Effects (More Common) . Nausea and throwing up (vomiting). These symptoms may happen within a few hours after your treatment and may last up to 24 hours. Medicines are available to stop or lessen these side effects. . Bone marrow depression. This is a decrease in the number of white blood cells, red blood cells, and platelets. This may raise your risk of infection, make you tired and weak (fatigue), and raise your risk of bleeding. . Soreness of the mouth and throat. You may have red areas, white patches, or sores that hurt. . This drug may affect how your kidneys work. Your kidney function will be checked as needed. . Electrolyte changes. Your blood will be checked for electrolyte changes as needed.  Side Effects (Less Common) . Hair loss. Some patients lose their hair on the scalp and body. You may notice your hair thinning seven to 14 days after getting this drug. . Effects on the nerves are called peripheral neuropathy. You may feel numbness, tingling, or pain in your hands and feet. It may be hard for you to button your clothes, open jars, or walk as usual. The effect on the nerves may get worse with more doses of the drug. These effects get better in some people after the drug is stopped but it does not get better in all people. . Loose bowel movements (diarrhea) that may last for several days . Decreased hearing or ringing in the ears . Changes in the way food and drinks taste . Changes in liver function. Your liver function will be checked as needed.  Allergic Reactions Serious allergic reactions including anaphylaxis are rare. While you are getting this drug in your vein (IV), tell  your nurse right away if you have any  of these symptoms of an allergic reaction: . Trouble catching your breath . Feeling like your tongue or throat are swelling . Feeling your heart beat quickly or in a not normal way (palpitations) . Feeling dizzy or lightheaded . Flushing, itching, rash, and/or hives  Treating Side Effects . Drink 6-8 cups of fluids each day unless your doctor has told you to limit your fluid intake due to some other health problem. A cup is 8 ounces of fluid. If you throw up or have loose bowel movements, you should drink more fluids so that you do not become dehydrated (lack water in the body from losing too much fluid). . Mouth care is very important. Your mouth care should consist of routine, gentle cleaning of your teeth or dentures and rinsing your mouth with a mixture of 1/2 teaspoon of salt in 8 ounces of water or  teaspoon of baking soda in 8 ounces of water. This should be done at least after each meal and at bedtime. . If you have mouth sores, avoid mouthwash that has alcohol. Avoid alcohol and smoking because they can bother your mouth and throat. . If you have numbness and tingling in your hands and feet, be careful when cooking, walking, and handling sharp objects and hot liquids. . Talk with your nurse about getting a wig before you lose your hair. Also, call the [facility] at 800-ACS-2345 to find out information about the "Look Good, Feel Better" program close to where you live. It is a free program where women getting chemotherapy can learn about wigs, turbans and scarves as well as makeup techniques and skin and nail care.  Food and Drug Interactions There are no known interactions of carboplatin with food. This drug may interact with other medicines. Tell your doctor and pharmacist about all the medicines and dietary supplements (vitamins, minerals, herbs and others) that you are taking at this time. The safety and use of dietary supplements and alternative diets are often not known.  Using these might affect your cancer or interfere with your treatment. Until more is known, you should not use dietary supplements or alternative diets without your doctor's help.  When to Call the Doctor Call your doctor or nurse right away if you have any of these symptoms: . Fever of 100.5 F (38 C) or above; chills . Bleeding or bruising that is not normal . Wheezing or trouble breathing . Nausea that stops you from eating or drinking . Throwing up more than once a day . Rash or itching . Loose bowel movements (diarrhea) more than four times a day or diarrhea with weakness or feeling lightheaded . Call your doctor or nurse as soon as possible if any of these symptoms happen: . Numbness, tingling, decreased feeling or weakness in fingers, toes, arms, or legs . Change in hearing, ringing in the ears . Blurred vision or other changes in eyesight . Decreased urine . Yellowing of skin or eyes  Problems and Reproductive Concerns Sexual problems and reproduction concerns may happen. In both men and women, this drug may affect your ability to have children. This cannot be determined before your treatment. Talk with your doctor or nurse if you plan to have children. Ask for information on sperm or egg banking. In men, this drug may interfere with your ability to make sperm, but it should not change your ability to have sexual relations. In women, menstrual bleeding may become irregular or  stop while you are getting this drug. Do not assume that you cannot become pregnant if you do not have a menstrual period. Women may go through signs of menopause (change of life) like vaginal dryness or itching. Vaginal lubricants can be used to lessen vaginal dryness, itching, and pain during sexual relations. Genetic counseling is available for you to talk about the effects of this drug therapy on future pregnancies. Also, a genetic counselor can look at the possible risk of problems in the unborn baby due to  this medicine if an exposure happens during pregnancy. . Pregnancy warning: This drug may have harmful effects on the unborn child, so effective methods of birth control should be used during your cancer treatment. . Breast feeding warning: It is not known if this drug passes into breast milk. For this reason, women should talk to their doctor about the risks and benefits of breast feeding during treatment with this drug because this drug may enter the breast milk and badly harm a breast feeding baby.   Herceptin - this is given to patients who overexpress HER2. Side Effects that may occur during infusion include: chills, fever, headache, dizziness, shortness of breath, low blood pressure, rash. This does not generally happen. We will have to perform MUGA scans or 2D echoes periodically during treatment however because a side effect of this drug can be cardiotoxicity. The first infusion of Herceptin takes 90 minutes and thereafter 30 minutes.   Perjeta  - is a HER2-targeted therapy that is designed to search for and attack HER2. This may, in both direct and indirect ways, prevent HER2-overexpressing breast cancer cells from growing. PERJETA works with Herceptin, another HER2-targeted therapy, to fight cancer cells that have too much HER2. Healthy cells also have HER2, and can be affected by HER2-targeted therapies, which may cause serious side effects. Perjeta can cause reduced heart function or congestive heart failure. We will routinely perform 2D Echoes of your heart to make sure your heart muscle is not weakening.  Most common side effects: feeling tired, nausea, vomiting, low white blood cell count, muscle aches     SELF CARE ACTIVITIES WHILE ON CHEMOTHERAPY: Hydration Increase your fluid intake 48 hours prior to treatment and drink at least 8 to 12 cups (64 ounces) of water/decaff beverages per day after treatment. You can still have your cup of coffee or soda but these beverages do not count  as part of your 8 to 12 cups that you need to drink daily. No alcohol intake.  Medications Continue taking your normal prescription medication as prescribed.  If you start any new herbal or new supplements please let us know first to make sure it is safe.  Mouth Care Have teeth cleaned professionally before starting treatment. Keep dentures and partial plates clean. Use soft toothbrush and do not use mouthwashes that contain alcohol. Biotene is a good mouthwash that is available at most pharmacies or may be ordered by calling [phone]. Use warm salt water gargles (1 teaspoon salt per 1 quart warm water) before and after meals and at bedtime. Or you may rinse with 2 tablespoons of three-percent hydrogen peroxide mixed in eight ounces of water. If you are still having problems with your mouth or sores in your mouth please call the clinic. If you need dental work, please let the doctor know before you go for your appointment so that we can coordinate the best possible time for you in regards to your chemo regimen. You need to also let  your dentist know that you are actively taking chemo. We may need to do labs prior to your dental appointment.   Skin Care Always use sunscreen that has not expired and with SPF (Sun Protection Factor) of 50 or higher. Wear hats to protect your head from the sun. Remember to use sunscreen on your hands, ears, face, & feet.  Use good moisturizing lotions such as udder cream, eucerin, or even Vaseline. Some chemotherapies can cause dry skin, color changes in your skin and nails.    . Avoid long, hot showers or baths. . Use gentle, fragrance-free soaps and laundry detergent. . Use moisturizers, preferably creams or ointments rather than lotions because the thicker consistency is better at preventing skin dehydration. Apply the cream or ointment within 15 minutes of showering. Reapply moisturizer at night, and moisturize your hands every time after you wash them.  Hair  Loss (if your doctor says your hair will fall out)  . If your doctor says that your hair is likely to fall out, decide before you begin chemo whether you want to wear a wig. You may want to shop before treatment to match your hair color. . Hats, turbans, and scarves can also camouflage hair loss, although some people prefer to leave their heads uncovered. If you go bare-headed outdoors, be sure to use sunscreen on your scalp. . Cut your hair short. It eases the inconvenience of shedding lots of hair, but it also can reduce the emotional impact of watching your hair fall out. . Don't perm or color your hair during chemotherapy. Those chemical treatments are already damaging to hair and can enhance hair loss. Once your chemo treatments are done and your hair has grown back, it's OK to resume dyeing or perming hair. With chemotherapy, hair loss is almost always temporary. But when it grows back, it may be a different color or texture. In older adults who still had hair color before chemotherapy, the new growth may be completely gray.  Often, new hair is very fine and soft.  Infection Prevention Please wash your hands for at least 30 seconds using warm soapy water. Handwashing is the #1 way to prevent the spread of germs. Stay away from sick people or people who are getting over a cold. If you develop respiratory systems such as green/yellow mucus production or productive cough or persistent cough let us know and we will see if you need an antibiotic. It is a good idea to keep a pair of gloves on when going into grocery stores/[employer] to decrease your risk of coming into contact with germs on the carts, etc. Carry alcohol hand gel with you at all times and use it frequently if out in public. If your temperature reaches 100.5 or higher please call the clinic and let us know.  If it is after hours or on the weekend please go to the ER if your temperature is over 100.5.  Please have your own personal thermometer  at home to use.    Sex and bodily fluids If you are going to have sex, a condom must be used to protect the person that isn't taking chemotherapy. Chemo can decrease your libido (sex drive). For a few days after chemotherapy, chemotherapy can be excreted through your bodily fluids.  When using the toilet please close the lid and flush the toilet twice.  Do this for a few day after you have had chemotherapy.     Effects of chemotherapy on your sex life Some changes  are simple and won't last long. They won't affect your sex life permanently. Sometimes you may feel: . too tired . not strong enough to be very active . sick or sore  . not in the mood . anxious or low Your anxiety might not seem related to sex. For example, you may be worried about the cancer and how your treatment is going. Or you may be worried about money, or about how you family are coping with your illness. These things can cause stress, which can affect your interest in sex. It's important to talk to your partner about how you feel. Remember - the changes to your sex life don't usually last long. There's usually no medical reason to stop having sex during chemo. The drugs won't have any long term physical effects on your performance or enjoyment of sex. Cancer can't be passed on to your partner during sex  Contraception It's important to use reliable contraception during treatment. Avoid getting pregnant while you or your partner are having chemotherapy. This is because the drugs may harm the baby. Sometimes chemotherapy drugs can leave a man or woman infertile.  This means you would not be able to have children in the future. You might want to talk to someone about permanent infertility. It can be very difficult to learn that you may no longer be able to have children. Some people find counselling helpful. There might be ways to preserve your fertility, although this is easier for men than for women. You may want to speak to a  fertility expert. You can talk about sperm banking or harvesting your eggs. You can also ask about other fertility options, such as donor eggs. If you have or have had breast cancer, your doctor might advise you not to take the contraceptive pill. This is because the hormones in it might affect the cancer.  It is not known for sure whether or not chemotherapy drugs can be passed on through semen or secretions from the vagina. Because of this some doctors advise people to use a barrier method if you have sex during treatment. This applies to vaginal, anal or oral sex. Generally, doctors advise a barrier method only for the time you are actually having the treatment and for about a week after your treatment. Advice like this can be worrying, but this does not mean that you have to avoid being intimate with your partner. You can still have close contact with your partner and continue to enjoy sex.  Animals If you have cats or birds we just ask that you not change the litter or change the cage.  Please have someone else do this for you while you are on chemotherapy.   Food Safety During and After Cancer Treatment Food safety is important for people both during and after cancer treatment. Cancer and cancer treatments, such as chemotherapy, radiation therapy, and stem cell/bone marrow transplantation, often weaken the immune system. This makes it harder for your body to protect itself from foodborne illness, also called food poisoning. Foodborne illness is caused by eating food that contains harmful bacteria, parasites, or viruses.  Foods to avoid Some foods have a higher risk of becoming tainted with bacteria. These include: Marland Kitchen Unwashed fresh fruit and vegetables, especially leafy vegetables that can hide dirt and other contaminants . Raw sprouts, such as alfalfa sprouts . Raw or undercooked beef, especially ground beef, or other raw or undercooked meat and poultry . Fatty, fried, or spicy foods  immediately before or after  treatment.  These can sit heavy on your stomach and make you feel nauseous. . Raw or undercooked shellfish, such as oysters. . Sushi and sashimi, which often contain raw fish.  . Unpasteurized beverages, such as unpasteurized fruit juices, raw milk, raw yogurt, or cider . Undercooked eggs, such as soft boiled, over easy, and poached; raw, unpasteurized eggs; or foods made with raw egg, such as homemade raw cookie dough and homemade mayonnaise Simple steps for food safety Shop smart. . Do not buy food stored or displayed in an unclean area. . Do not buy bruised or damaged fruits or vegetables. . Do not buy cans that have cracks, dents, or bulges. . Pick up foods that can spoil at the end of your shopping trip and store them in a cooler on the way home. Prepare and clean up foods carefully. . Rinse all fresh fruits and vegetables under running water, and dry them with a clean towel or paper towel. . Clean the top of cans before opening them. . After preparing food, wash your hands for 20 seconds with hot water and soap. Pay special attention to areas between fingers and under nails. . Clean your utensils and dishes with hot water and soap. Marland Kitchen Disinfect your kitchen and cutting boards using 1 teaspoon of liquid, unscented bleach mixed into 1 quart of water.   Dispose of old food. . Eat canned and packaged food before its expiration date (the "use by" or "best before" date). . Consume refrigerated leftovers within 3 to 4 days. After that time, throw out the food. Even if the food does not smell or look spoiled, it still may be unsafe. Some bacteria, such as Listeria, can grow even on foods stored in the refrigerator if they are kept for too long. Take precautions when eating out. . At restaurants, avoid buffets and salad bars where food sits out for a long time and comes in contact with many people. Food can become contaminated when someone with a virus, often a norovirus,  or another "bug" handles it. . Put any leftover food in a "to-go" container yourself, rather than having the server do it. And, refrigerate leftovers as soon as you get home. . Choose restaurants that are clean and that are willing to prepare your food as you order it cooked.    MEDICATIONS: Dexamethasone 9m tablet. Start the day before Taxotere. Then again the day after chemo for 3 days.  Take with food.                                                                                                                                                                Zofran/Ondansetron 862mtablet. Take 1 tablet every 8 hours as needed for nausea/vomiting. (#1 nausea med to take, this can  constipate)  Compazine/Prochlorperazine 42m tablet. Take 1 tablet every 6 hours as needed for nausea/vomiting. (#2 nausea med to take, this can make you sleepy)   EMLA cream. Apply a quarter size amount to port site 1 hour prior to chemo. Do not rub in. Cover with plastic wrap.   Over-the-Counter Meds:  Miralax 1 capful in 8 oz of fluid daily. May increase to two times a day if needed. This is a stool softener. If this doesn't work proceed you can add:  Senokot S-start with 1 tablet two times a day and increase to 4 tablets two times a day if needed. (total of 8 tablets in a 24 hour period). This is a stimulant laxative.   Call [place] this does not help your bowels move.   Imodium 271mcapsule. Take 2 capsules after the 1st loose stool and then 1 capsule every 2 hours until you go a total of 12 hours without having a loose stool. Call the [facility] loose stools continue. If diarrhea occurs @ bedtime, take 2 capsules @ bedtime. Then take 2 capsules every 4 hours until morning. Call [facility]    Diarrhea Sheet  If you are having loose stools/diarrhea, please purchase Imodium and begin taking as outlined:  At the first sign of poorly formed or loose stools you should begin taking Imodium(loperamide)  2 mg capsules.  Take two caplets (72m60mfollowed by one caplet (2mg20mvery 2 hours until you have had no diarrhea for 12 hours.  During the night take two caplets (72mg)672m bedtime and continue every 4 hours during the night until the morning.  Stop taking Imodium only after there is no sign of diarrhea for 12 hours.    Always call the [place] are having loose [place]ools/diarrhea that you can't get under control.  Loose stools/disrrhea leads to dehydration (loss of water) in your body.  We have other options of trying to get the loose stools/diarrhea to stopped but you must let us kn[place]orea!     Constipation Sheet *Miralax in 8 oz of fluid daily.  May increase to two times a day if needed.  This is a stool softener.  If this not enough to keep your bowel regular:  You can add:  *Senokot S, start with one tablet twice a day and can increase to 4 tablets twice a day if needed.  This is a stimulant laxative.   Sometimes when you take pain medication you need BOTH a medicine to keep your stool soft and a medicine to help your bowel push it out!  Please call if the above does not work for you.   Do not go more than 2 days without a bowel movement.  It is very important that you do not become constipated.  It will make you feel sick to your stomach (nausea) and can cause abdominal pain and vomiting.     Nausea Sheet  Zofran/Ondansetron 8mg t62met. Take 1 tablet every 8 hours as needed for nausea/vomiting. (#1 nausea med to take, this can constipate)  Compazine/Prochlorperazine 10mg t72mt. Take 1 tablet every 6 hours as needed for nausea/vomiting. (#2 nausea med to take, this can make you sleepy)  You can take these medications together or separately.  We would first like for you to try the Ondansetron by itself and then take the Prochloperizine if needed. But you are allowed to take both medications at the same time if your nausea is that severe.  If you  are having persistent nausea (nausea  that does not stop) please take these medications on a staggered schedule so that the nausea medication stays in your body.  Please call the [facility] and let us know the amount of nausea that you are experiencing.  If you begin to vomit, you need to call the [facility] and if it is the weekend and you have vomited more than one time and cant get it to stop-go to the Emergency Room.  Persistent nausea/vomiting can lead to dehydration (loss of fluid in your body) and will make you feel terrible.   Ice chips, sips of clear liquids, foods that are @ room temperature, crackers, and toast tend to be better tolerated.    SYMPTOMS TO REPORT AS SOON AS POSSIBLE AFTER TREATMENT:  FEVER GREATER THAN 100.5 F  CHILLS WITH OR WITHOUT FEVER  NAUSEA AND VOMITING THAT IS NOT CONTROLLED WITH YOUR NAUSEA MEDICATION  UNUSUAL SHORTNESS OF BREATH  UNUSUAL BRUISING OR BLEEDING  TENDERNESS IN MOUTH AND THROAT WITH OR WITHOUT PRESENCE OF ULCERS  URINARY PROBLEMS  BOWEL PROBLEMS  UNUSUAL RASH    Wear comfortable clothing and clothing appropriate for easy access to any Portacath or PICC line. Let us know if there is anything that we can do to make your therapy better!     What to do if you need assistance after hours or on the weekends: CALL [phone].  HOLD on the line, do not hang up.  You will hear multiple messages but at the end you will be connected with a nurse triage line.  They will contact the doctor if necessary.  Most of the time they will be able to assist you.  Do not call the hospital operator.       I have been informed and understand all of the instructions given to me and have received a copy. I have been instructed to call the clinic [phone] or my family physician as soon as possible for continued medical care, if indicated. I do not have any more questions at this time but understand that I may call the [facility] or the Patient Navigator at [phone] during  office hours should I have questions or need assistance in obtaining follow-up care.

## 2017-05-26 NOTE — Telephone Encounter (Signed)
COMPLETED APPS FOR RX REPLACEMENT FOR NEULASTA/HERCEPTIN/PERJETA. WILL HAVE PT SIGN ON 10/3 AND FAX TO [facility]

## 2017-05-26 NOTE — Progress Notes (Signed)
Chemotherapy teaching pulled together and appts made.  RN called to go over instructions for steroids.  Take 8 mg (2 tablets) 2 times a day, the day before chemo and for 3 days after.  The pt verbalized understanding.

## 2017-05-27 ENCOUNTER — Encounter (HOSPITAL_COMMUNITY): Payer: Self-pay

## 2017-05-27 ENCOUNTER — Encounter (HOSPITAL_BASED_OUTPATIENT_CLINIC_OR_DEPARTMENT_OTHER): Payer: Self-pay

## 2017-05-27 VITALS — BP 102/85 | HR 71 | Temp 98.1°F | Resp 18 | Wt 124.0 lb

## 2017-05-27 DIAGNOSIS — Z170 Estrogen receptor positive status [ER+]: Principal | ICD-10-CM

## 2017-05-27 DIAGNOSIS — E876 Hypokalemia: Secondary | ICD-10-CM

## 2017-05-27 DIAGNOSIS — Z5112 Encounter for antineoplastic immunotherapy: Secondary | ICD-10-CM

## 2017-05-27 DIAGNOSIS — C773 Secondary and unspecified malignant neoplasm of axilla and upper limb lymph nodes: Secondary | ICD-10-CM

## 2017-05-27 DIAGNOSIS — C50811 Malignant neoplasm of overlapping sites of right female breast: Secondary | ICD-10-CM

## 2017-05-27 DIAGNOSIS — Z5111 Encounter for antineoplastic chemotherapy: Secondary | ICD-10-CM

## 2017-05-27 LAB — CBC WITH DIFFERENTIAL/PLATELET
Basophils Absolute: 0 10*3/uL (ref 0.0–0.1)
Basophils Relative: 0 %
EOS ABS: 0 10*3/uL (ref 0.0–0.7)
EOS PCT: 0 %
HCT: 38.7 % (ref 36.0–46.0)
Hemoglobin: 13.2 g/dL (ref 12.0–15.0)
LYMPHS ABS: 1.2 10*3/uL (ref 0.7–4.0)
LYMPHS PCT: 9 %
MCH: 32.7 pg (ref 26.0–34.0)
MCHC: 34.1 g/dL (ref 30.0–36.0)
MCV: 95.8 fL (ref 78.0–100.0)
MONO ABS: 1 10*3/uL (ref 0.1–1.0)
MONOS PCT: 7 %
Neutro Abs: 12.3 10*3/uL — ABNORMAL HIGH (ref 1.7–7.7)
Neutrophils Relative %: 84 %
PLATELETS: 269 10*3/uL (ref 150–400)
RBC: 4.04 MIL/uL (ref 3.87–5.11)
RDW: 13 % (ref 11.5–15.5)
WBC: 14.5 10*3/uL — AB (ref 4.0–10.5)

## 2017-05-27 LAB — COMPREHENSIVE METABOLIC PANEL
ALT: 17 U/L (ref 14–54)
ANION GAP: 7 (ref 5–15)
AST: 22 U/L (ref 15–41)
Albumin: 3.6 g/dL (ref 3.5–5.0)
Alkaline Phosphatase: 58 U/L (ref 38–126)
BUN: 8 mg/dL (ref 6–20)
CHLORIDE: 105 mmol/L (ref 101–111)
CO2: 24 mmol/L (ref 22–32)
CREATININE: 0.56 mg/dL (ref 0.44–1.00)
Calcium: 9.1 mg/dL (ref 8.9–10.3)
Glucose, Bld: 94 mg/dL (ref 65–99)
POTASSIUM: 3.4 mmol/L — AB (ref 3.5–5.1)
SODIUM: 136 mmol/L (ref 135–145)
Total Bilirubin: 0.5 mg/dL (ref 0.3–1.2)
Total Protein: 6.8 g/dL (ref 6.5–8.1)

## 2017-05-27 MED ORDER — PALONOSETRON HCL INJECTION 0.25 MG/5ML
0.2500 mg | Freq: Once | INTRAVENOUS | Status: AC
  Administered 2017-05-27: 0.25 mg via INTRAVENOUS

## 2017-05-27 MED ORDER — DIPHENHYDRAMINE HCL 25 MG PO CAPS
50.0000 mg | ORAL_CAPSULE | Freq: Once | ORAL | Status: AC
  Administered 2017-05-27: 50 mg via ORAL

## 2017-05-27 MED ORDER — PEGFILGRASTIM 6 MG/0.6ML ~~LOC~~ PSKT
6.0000 mg | PREFILLED_SYRINGE | Freq: Once | SUBCUTANEOUS | Status: AC
  Administered 2017-05-27: 6 mg via SUBCUTANEOUS
  Filled 2017-05-27: qty 0.6

## 2017-05-27 MED ORDER — SODIUM CHLORIDE 0.9 % IV SOLN
600.0000 mg | Freq: Once | INTRAVENOUS | Status: AC
  Administered 2017-05-27: 600 mg via INTRAVENOUS
  Filled 2017-05-27: qty 60

## 2017-05-27 MED ORDER — DEXAMETHASONE SODIUM PHOSPHATE 10 MG/ML IJ SOLN
INTRAMUSCULAR | Status: AC
  Filled 2017-05-27: qty 1

## 2017-05-27 MED ORDER — SODIUM CHLORIDE 0.9 % IV SOLN: 10.0000 mg | Freq: Once | INTRAVENOUS | Status: DC

## 2017-05-27 MED ORDER — DEXTROSE 5 % IV SOLN
75.0000 mg/m2 | Freq: Once | INTRAVENOUS | Status: AC
  Administered 2017-05-27: 120 mg via INTRAVENOUS
  Filled 2017-05-27: qty 12

## 2017-05-27 MED ORDER — SODIUM CHLORIDE 0.9 % IV SOLN
840.0000 mg | Freq: Once | INTRAVENOUS | Status: AC
  Administered 2017-05-27: 840 mg via INTRAVENOUS
  Filled 2017-05-27: qty 28

## 2017-05-27 MED ORDER — HEPARIN SOD (PORK) LOCK FLUSH 100 UNIT/ML IV SOLN
500.0000 [IU] | Freq: Once | INTRAVENOUS | Status: AC | PRN
  Administered 2017-05-27: 500 [IU]

## 2017-05-27 MED ORDER — DIPHENHYDRAMINE HCL 25 MG PO CAPS
ORAL_CAPSULE | ORAL | Status: AC
  Filled 2017-05-27: qty 2

## 2017-05-27 MED ORDER — SODIUM CHLORIDE 0.9 % IV SOLN
Freq: Once | INTRAVENOUS | Status: AC
  Administered 2017-05-27: 500 mL via INTRAVENOUS

## 2017-05-27 MED ORDER — SODIUM CHLORIDE 0.9 % IV SOLN
450.0000 mg | Freq: Once | INTRAVENOUS | Status: AC
  Administered 2017-05-27: 450 mg via INTRAVENOUS
  Filled 2017-05-27: qty 21.43

## 2017-05-27 MED ORDER — PALONOSETRON HCL INJECTION 0.25 MG/5ML
INTRAVENOUS | Status: AC
  Filled 2017-05-27: qty 5

## 2017-05-27 MED ORDER — ACETAMINOPHEN 325 MG PO TABS
ORAL_TABLET | ORAL | Status: AC
  Filled 2017-05-27: qty 2

## 2017-05-27 MED ORDER — SODIUM CHLORIDE 0.9% FLUSH
10.0000 mL | INTRAVENOUS | Status: DC | PRN
  Administered 2017-05-27: 10 mL
  Filled 2017-05-27: qty 10

## 2017-05-27 MED ORDER — DEXAMETHASONE SODIUM PHOSPHATE 10 MG/ML IJ SOLN
10.0000 mg | Freq: Once | INTRAMUSCULAR | Status: AC
  Administered 2017-05-27: 10 mg via INTRAVENOUS

## 2017-05-27 MED ORDER — POTASSIUM CHLORIDE CRYS ER 20 MEQ PO TBCR
40.0000 meq | EXTENDED_RELEASE_TABLET | Freq: Once | ORAL | Status: AC
  Administered 2017-05-27: 40 meq via ORAL
  Filled 2017-05-27: qty 2

## 2017-05-27 MED ORDER — ACETAMINOPHEN 325 MG PO TABS
650.0000 mg | ORAL_TABLET | Freq: Once | ORAL | Status: AC
  Administered 2017-05-27: 650 mg via ORAL

## 2017-05-27 NOTE — Patient Instructions (Signed)
[facility] Discharge Instructions for Patients Receiving Chemotherapy  Today you received the following chemotherapy agents taxotere, carboplatin, perjeta, and herceptin.    To help prevent nausea and vomiting after your treatment, we encourage you to take your nausea medication.    If you develop nausea and vomiting that is not controlled by your nausea medication, call the clinic.   BELOW ARE SYMPTOMS THAT SHOULD BE REPORTED IMMEDIATELY:  *FEVER GREATER THAN 100.5 F  *CHILLS WITH OR WITHOUT FEVER  NAUSEA AND VOMITING THAT IS NOT CONTROLLED WITH YOUR NAUSEA MEDICATION  *UNUSUAL SHORTNESS OF BREATH  *UNUSUAL BRUISING OR BLEEDING  TENDERNESS IN MOUTH AND THROAT WITH OR WITHOUT PRESENCE OF ULCERS  *URINARY PROBLEMS  *BOWEL PROBLEMS  UNUSUAL RASH Items with * indicate a potential emergency and should be followed up as soon as possible.  Feel free to call the clinic should you have any questions or concerns. The clinic phone number is [phone].  Please show the [facility] at check-in to the Emergency Department and triage nurse.

## 2017-05-27 NOTE — Progress Notes (Signed)
Patient to treatment room for labs, chemotherapy, and teaching.  Patient stated she took the dexamethasone as directed yesterday before treatment and has her nausea medications.  Port accessed with no difficulty and good blood return noted. All questions asked and answered.    Reviewed labs potassium 3.4 with Dr. Talbert Cage and other lab work.  Orders for potassium tab 40 meq by mouth and ok to treat.    Patient tolerated chemotherapy with no complaints voiced.  Port site clean and dry with no bruising or swelling noted.  Band aid applied.  Nuelasta onpro applied to right arm with green flashing light noted.  Instructions given to the patient regarding onpro with all questions asked and answered.  VSS with discharge and left via wheelchair with husband.  No s/s of distress.  Instructed the patient to call for any concerns with understanding verbalized.

## 2017-05-27 NOTE — Progress Notes (Signed)
Pt taken to the journey room to pick out a wig. 

## 2017-05-27 NOTE — Progress Notes (Signed)
Chemotherapy teaching completed.  Consent signed.  Extensive teaching packet given.   

## 2017-05-28 ENCOUNTER — Encounter (HOSPITAL_COMMUNITY): Payer: Self-pay | Admitting: Emergency Medicine

## 2017-05-28 ENCOUNTER — Telehealth (HOSPITAL_COMMUNITY): Payer: Self-pay | Admitting: Emergency Medicine

## 2017-05-28 LAB — CANCER ANTIGEN 15-3: CAN 15 3: 107.6 U/mL — AB (ref 0.0–25.0)

## 2017-05-28 LAB — CANCER ANTIGEN 27.29: CAN 27.29: 130.9 U/mL — AB (ref 0.0–38.6)

## 2017-05-28 NOTE — Progress Notes (Signed)
24 hour follow up-pt came in to bring the [place] her tax information.  Pt states that she is doing well.  Drinking plenty of fluids.  No nausea.  Overall feeling decent.  Pt was in good spirits was she was in the clinic.  Told to call RN If she has any problems.

## 2017-05-28 NOTE — Telephone Encounter (Signed)
Pt called and stated that she had tempeture of 100.3 at 3:30 am.  Pt sis not take tylenol or ibuprofen.  She drank water and this morning her tempeture  was 99.0.  RN explained that if fever reaches 100.5 to go to the ER to be checked out and pt can take tylenol or ibuprofen.  Pt verbalized understanding.  Dr Talbert Cage notified.  No orders received.

## 2017-06-02 ENCOUNTER — Telehealth (HOSPITAL_COMMUNITY): Payer: Self-pay | Admitting: Emergency Medicine

## 2017-06-02 NOTE — Telephone Encounter (Signed)
Pt called and stated she was having neck and back pain.  I told her to take ibuprofen for the next few days.  If this pain gets worse or does not improve to call me back.  Pt states that she has been doing well other than that.  No nausea.  Eating well and drinking lots of water.

## 2017-06-04 ENCOUNTER — Ambulatory Visit (INDEPENDENT_AMBULATORY_CARE_PROVIDER_SITE_OTHER): Payer: Self-pay | Admitting: Orthopaedic Surgery

## 2017-06-04 ENCOUNTER — Ambulatory Visit (INDEPENDENT_AMBULATORY_CARE_PROVIDER_SITE_OTHER): Payer: Self-pay

## 2017-06-04 DIAGNOSIS — S82872A Displaced pilon fracture of left tibia, initial encounter for closed fracture: Secondary | ICD-10-CM

## 2017-06-04 NOTE — Progress Notes (Signed)
The patient is here for follow-up after removed the screw was prominent from a distal tibia on the left side. She is doing well overall other than the fact that she is undergoing cancer treatments for breast cancer. She denies any ankle pain. Without a Cam Walker. Is also let her weight-bear as tolerated.  On examination there is no evidence infection. I removed the sutures and place Steri-Strips. She's got good range of motion of her ankle with minimal discomfort.  3 views of the ankle are obtained and left side show interval healing of the fracture and overall good alignment ankle joint itself.  At this point she'll continue to weight-bear as tolerated but using an ASO. Stop the ASO after a month. At this point she would like to follow up as needed I agree with this given her cancer treatment. She understands if she has any issues with her ankle at all she should not hesitate to come back and see me.

## 2017-06-05 ENCOUNTER — Encounter (HOSPITAL_COMMUNITY): Payer: Self-pay | Admitting: Hematology and Oncology

## 2017-06-05 ENCOUNTER — Encounter (HOSPITAL_BASED_OUTPATIENT_CLINIC_OR_DEPARTMENT_OTHER): Payer: Self-pay | Admitting: Hematology and Oncology

## 2017-06-05 ENCOUNTER — Encounter (HOSPITAL_COMMUNITY): Payer: Self-pay

## 2017-06-05 VITALS — BP 107/70 | HR 83 | Resp 16

## 2017-06-05 DIAGNOSIS — C50811 Malignant neoplasm of overlapping sites of right female breast: Secondary | ICD-10-CM

## 2017-06-05 DIAGNOSIS — Z170 Estrogen receptor positive status [ER+]: Principal | ICD-10-CM

## 2017-06-05 LAB — CBC WITH DIFFERENTIAL/PLATELET
BASOS ABS: 0 10*3/uL (ref 0.0–0.1)
Basophils Relative: 0 %
Eosinophils Absolute: 0 10*3/uL (ref 0.0–0.7)
Eosinophils Relative: 0 %
HEMATOCRIT: 39.9 % (ref 36.0–46.0)
HEMOGLOBIN: 13.5 g/dL (ref 12.0–15.0)
LYMPHS PCT: 4 %
Lymphs Abs: 1.3 10*3/uL (ref 0.7–4.0)
MCH: 32.9 pg (ref 26.0–34.0)
MCHC: 33.8 g/dL (ref 30.0–36.0)
MCV: 97.3 fL (ref 78.0–100.0)
MONOS PCT: 4 %
Monocytes Absolute: 1.3 10*3/uL — ABNORMAL HIGH (ref 0.1–1.0)
NEUTROS PCT: 92 %
Neutro Abs: 30.5 10*3/uL — ABNORMAL HIGH (ref 1.7–7.7)
Platelets: 226 10*3/uL (ref 150–400)
RBC: 4.1 MIL/uL (ref 3.87–5.11)
RDW: 13.8 % (ref 11.5–15.5)
WBC: 33.1 10*3/uL — AB (ref 4.0–10.5)

## 2017-06-05 LAB — COMPREHENSIVE METABOLIC PANEL
ALBUMIN: 3.9 g/dL (ref 3.5–5.0)
ALK PHOS: 143 U/L — AB (ref 38–126)
ALT: 60 U/L — ABNORMAL HIGH (ref 14–54)
AST: 46 U/L — AB (ref 15–41)
Anion gap: 9 (ref 5–15)
BILIRUBIN TOTAL: 0.3 mg/dL (ref 0.3–1.2)
BUN: 11 mg/dL (ref 6–20)
CO2: 30 mmol/L (ref 22–32)
Calcium: 9.1 mg/dL (ref 8.9–10.3)
Chloride: 100 mmol/L — ABNORMAL LOW (ref 101–111)
Creatinine, Ser: 0.68 mg/dL (ref 0.44–1.00)
GFR calc Af Amer: >60 mL/min (ref >60)
GFR calc non Af Amer: >60 mL/min (ref >60)
GLUCOSE: 88 mg/dL (ref 65–99)
POTASSIUM: 3.6 mmol/L (ref 3.5–5.1)
Sodium: 139 mmol/L (ref 135–145)
TOTAL PROTEIN: 7.2 g/dL (ref 6.5–8.1)

## 2017-06-06 LAB — CANCER ANTIGEN 15-3: CAN 15 3: 96.4 U/mL — AB (ref 0.0–25.0)

## 2017-06-06 LAB — CANCER ANTIGEN 27.29: CA 27.29: 114.3 U/mL — ABNORMAL HIGH (ref 0.0–38.6)

## 2017-06-11 NOTE — Assessment & Plan Note (Signed)
46 y.o. 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 Patient appears to be tolerating treatment well so far without any significant side effects other than constipation which is attributable to use of pain medications and antiemetics.  In the interim, noticeable reduction in the bulk over the breast tumor as well as improvement over appearance of the ulcerated portion of the tumor are strongly suggestive of positive effective therapy.  Plan: --RTC 1 week: labs, clinic visit for the second cycle of neoadjuvant therapy

## 2017-06-11 NOTE — Progress Notes (Signed)
[facility] Follow-up Visit:  Assessment: Malignant neoplasm of overlapping sites of right breast in female, estrogen receptor positive ([place]) 46 y.o. 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 Patient appears to be tolerating treatment well so far without any significant side effects other than constipation which is attributable to use of pain medications and antiemetics.  In the interim, noticeable reduction in the bulk over the breast tumor as well as improvement over appearance of the ulcerated portion of the tumor are strongly suggestive of positive effective therapy.  Plan: --RTC 1 week: labs, clinic visit for the second cycle of neoadjuvant therapy  Voice recognition software was used and creation of this note. Despite my best effort at editing the text, some misspelling/errors may have occurred.   Orders Placed This Encounter  Procedures  . CBC with Differential    Standing Status:   Future    Standing Expiration Date:   06/05/2018  . Comprehensive metabolic panel    Standing Status:   Future    Standing Expiration Date:   06/05/2018  . Magnesium    Standing Status:   Future    Standing Expiration Date:   06/05/2018    Cancer Staging Malignant neoplasm of overlapping sites of right female breast [facility]) Staging form: Breast, AJCC 8th Edition - Clinical: No Stage Recommended (ycT4b, cN3a, cM0, G3, ER: Positive, PR: Positive, HER2: Positive) - Signed by Twana First, MD on 05/25/2017   All questions were answered.  . The patient knows to call the clinic with any problems, questions or concerns.  This note was electronically signed.    History of Presenting Illness Angel Hale 46 y.o. presenting to the [facility] [place] for Toxicity monitoring after initiation of neoadjuvant systemic cumulative. With docetaxel, carboplatin, Trastuzumab, and Pertuzumba  (Perjeta) combination for diagnosis of multifocal, triple positive cancer of the right breast.Patient has tolerated an usual fusion without difficulties. Her complaints include fatigue, and mild constipation. Contents and generalized weakness. Denies swelling and bilateral lower extremities, palpitations, chest pain, or shortness of breath. Patient believes that the tumors in her right breast are decreasing in size. She also notices that the ulcerated lesion underneath the right breast has flattened out and no longer produces significant drainage amounts.  Oncological/hematological History:  No history exists.    Medical History: Past Medical History:  Diagnosis Date  . Cancer [facility])    breast cancer    Surgical History: Past Surgical History:  Procedure Laterality Date  . BUNIONECTOMY Left 2000  . EXTERNAL FIXATION LEG Left 02/16/2017   Procedure: EXTERNAL FIXATION LEG;  Surgeon: Mcarthur Rossetti, MD;  Location: [place];  Service: Orthopedics;  Laterality: Left;  . HARDWARE REMOVAL Left 05/21/2017   Procedure: REMOVAL OF RETAINED PROMINENT SCREW LEFT ANKLE;  Surgeon: Mcarthur Rossetti, MD;  Location: [place];  Service: Orthopedics;  Laterality: Left;  . ORIF ANKLE FRACTURE Left 03/03/2017  . ORIF ANKLE FRACTURE Left 03/03/2017   Procedure: OPEN REDUCTION INTERNAL FIXATION (ORIF) LEFT PILON FRACTURE AND LEFT LATERAL MALLEOLUS;  Surgeon: Mcarthur Rossetti, MD;  Location: [place];  Service: Orthopedics;  Laterality: Left;  . OVARIAN CYST REMOVAL Left 1991  . PORTACATH PLACEMENT    . PORTACATH PLACEMENT Left 05/20/2017   Procedure: INSERTION PORT-A-CATH;  Surgeon: Virl Cagey, MD;  Location: [facility];  Service: General;  Laterality: Left;  . WISDOM TOOTH EXTRACTION      Family History: Family History  Problem Relation Age of Onset  . Colon cancer Father     Social History: Social History   Social History  . Marital status: Married    Spouse name: N/A  . Number of  children: N/A  . Years of education: N/A   Occupational History  . Not on file.   Social History Main Topics  . Smoking status: Former Smoker    Packs/day: 1.00    Years: 10.00    Types: Cigarettes    Quit date: 02/16/2017  . Smokeless tobacco: Never Used  . Alcohol use Yes     Comment: occasional  . Drug use: No  . Sexual activity: Not Currently    Birth control/ protection: None   Other Topics Concern  . Not on file   Social History Narrative  . No narrative on file    Allergies: Allergies  Allergen Reactions  . Penicillins Nausea And Vomiting    Has patient had a PCN reaction causing immediate rash, facial/tongue/throat swelling, SOB or lightheadedness with hypotension: No Has patient had a PCN reaction causing severe rash involving mucus membranes or skin necrosis: No Has patient had a PCN reaction that required hospitalization: No Has patient had a PCN reaction occurring within the last 10 years: No If all of the above answers are "NO", then may proceed with Cephalosporin use.     Medications:  Current Outpatient Prescriptions  Medication Sig Dispense Refill  . acetaminophen (TYLENOL) 500 MG tablet Take 1,000 mg by mouth every 6 (six) hours as needed (for pain.).    Marland Kitchen CARBOPLATIN IV Inject into the vein. Every 3 weeks    . Coenzyme Q10 (CO Q-10 PO) Take 1 tablet by mouth daily.    . Cyanocobalamin (B-12 PO) Take 1 tablet by mouth daily.    Marland Kitchen dexamethasone (DECADRON) 4 MG tablet Take 2 tablets (8 mg total) by mouth 2 (two) times daily. Start the day before Taxotere. Then again the day after chemo for 3 days. 30 tablet 1  . DOCEtaxel (TAXOTERE IV) Inject into the vein. Every 3 weeks    . Homeopathic Products (FRANKINCENSE UPLIFTING) OIL Take 1 capsule by mouth daily as needed (for home remedy).    Marland Kitchen HYDROcodone-acetaminophen (NORCO) 5-325 MG tablet Take 1 tablet by mouth every 4 (four) hours as needed for moderate pain. 6 tablet 0  . LAVENDER OIL PO Take 1 capsule  by mouth daily as needed (for home remedy).    Marland Kitchen lidocaine-prilocaine (EMLA) cream Apply to affected area once 30 g 3  . LORazepam (ATIVAN) 0.5 MG tablet Take 1 tablet (0.5 mg total) by mouth every 6 (six) hours as needed (Nausea or vomiting). 30 tablet 0  . Multiple Vitamins-Minerals (ZINC PO) Take 1 capsule by mouth daily.    . ondansetron (ZOFRAN) 8 MG tablet Take 1 tablet (8 mg total) by mouth 2 (two) times daily as needed for refractory nausea / vomiting. Start on day 3 after chemo. 30 tablet 1  . Pegfilgrastim (NEULASTA ONPRO [place]) Inject into the skin. Every 3 weeks    . Pertuzumab (PERJETA IV) Inject into the vein. Every 3 weeks    . prochlorperazine (COMPAZINE) 10 MG tablet Take 1 tablet (10 mg total) by mouth every 6 (six) hours as needed (Nausea or vomiting). 30 tablet 1  . sulfamethoxazole-trimethoprim (BACTRIM DS,SEPTRA DS) 800-160 MG tablet Take 2 tablets by mouth 2 (two) times daily.    . Trastuzumab (HERCEPTIN IV) Inject into the vein. Every 3 weeks    .  vitamin C (ASCORBIC ACID) 500 MG tablet Take 1 tablet (500 mg total) by mouth daily. 90 tablet 0  . methocarbamol (ROBAXIN) 500 MG tablet Take 1 tablet (500 mg total) by mouth every 6 (six) hours as needed for muscle spasms. (Patient not taking: Reported on 05/15/2017) 40 tablet 1   No current facility-administered medications for this visit.     Review of Systems: Review of Systems  All other systems reviewed and are negative.    PHYSICAL EXAMINATION Blood pressure 107/70, pulse 83, resp. rate 16, last menstrual period 05/12/2017, SpO2 100 %.  ECOG PERFORMANCE STATUS: 1 - Symptomatic but completely ambulatory  Physical Exam  Constitutional: She is oriented to person, place, and time and well-developed, well-nourished, and in no distress. No distress.  HENT:  Head: Normocephalic.  Mouth/Throat: Oropharynx is clear and moist. No oropharyngeal exudate.  Eyes: Pupils are equal, round, and reactive to light. Conjunctivae and  EOM are normal. Right eye exhibits no discharge. Scleral icterus is present.  Neck: No thyromegaly present.  Cardiovascular: Normal rate, regular rhythm and normal heart sounds.   No murmur heard. Pulmonary/Chest: Effort normal and breath sounds normal. No respiratory distress. She has no wheezes. She has no rales.  Breast exam deferred on patient's request.  Abdominal: Soft. Bowel sounds are normal. She exhibits no distension. There is no tenderness. There is no rebound and no guarding.  Musculoskeletal: Normal range of motion. She exhibits no edema.  Lymphadenopathy:    She has no cervical adenopathy.  Neurological: She is alert and oriented to person, place, and time. She has normal reflexes. She displays normal reflexes.  Skin: Skin is warm and dry. No rash noted. She is not diaphoretic. No erythema. No pallor.     LABORATORY DATA: I have personally reviewed the data as listed: Appointment on 06/05/2017  Component Date Value Ref Range Status  . WBC 06/05/2017 33.1* 4.0 - 10.5 K/uL Final   WHITE COUNT CONFIRMED ON SMEAR  . RBC 06/05/2017 4.10  3.87 - 5.11 MIL/uL Final  . Hemoglobin 06/05/2017 13.5  12.0 - 15.0 g/dL Final  . HCT 06/05/2017 39.9  36.0 - 46.0 % Final  . MCV 06/05/2017 97.3  78.0 - 100.0 fL Final  . MCH 06/05/2017 32.9  26.0 - 34.0 pg Final  . MCHC 06/05/2017 33.8  30.0 - 36.0 g/dL Final  . RDW 06/05/2017 13.8  11.5 - 15.5 % Final  . Platelets 06/05/2017 226  150 - 400 K/uL Final  . Neutrophils Relative % 06/05/2017 92  % Final  . Lymphocytes Relative 06/05/2017 4  % Final  . Monocytes Relative 06/05/2017 4  % Final  . Eosinophils Relative 06/05/2017 0  % Final  . Basophils Relative 06/05/2017 0  % Final  . Neutro Abs 06/05/2017 30.5* 1.7 - 7.7 K/uL Final  . Lymphs Abs 06/05/2017 1.3  0.7 - 4.0 K/uL Final  . Monocytes Absolute 06/05/2017 1.3* 0.1 - 1.0 K/uL Final  . Eosinophils Absolute 06/05/2017 0.0  0.0 - 0.7 K/uL Final  . Basophils Absolute 06/05/2017 0.0  0.0  - 0.1 K/uL Final  . WBC Morphology 06/05/2017 VACUOLATED NEUTROPHILS   Final   Comment: ATYPICAL LYMPHOCYTES MILD LEFT SHIFT (1-5% METAS, OCC MYELO, OCC BANDS)   . Sodium 06/05/2017 139  135 - 145 mmol/L Final  . Potassium 06/05/2017 3.6  3.5 - 5.1 mmol/L Final  . Chloride 06/05/2017 100* 101 - 111 mmol/L Final  . CO2 06/05/2017 30  22 - 32 mmol/L Final  .  Glucose, Bld 06/05/2017 88  65 - 99 mg/dL Final  . BUN 06/05/2017 11  6 - 20 mg/dL Final  . Creatinine, Ser 06/05/2017 0.68  0.44 - 1.00 mg/dL Final  . Calcium 06/05/2017 9.1  8.9 - 10.3 mg/dL Final  . Total Protein 06/05/2017 7.2  6.5 - 8.1 g/dL Final  . Albumin 06/05/2017 3.9  3.5 - 5.0 g/dL Final  . AST 06/05/2017 46* 15 - 41 U/L Final  . ALT 06/05/2017 60* 14 - 54 U/L Final  . Alkaline Phosphatase 06/05/2017 143* 38 - 126 U/L Final  . Total Bilirubin 06/05/2017 0.3  0.3 - 1.2 mg/dL Final  . GFR calc non Af Amer 06/05/2017 >60  >60 mL/min Final  . GFR calc Af Amer 06/05/2017 >60  >60 mL/min Final   Comment: (NOTE) The eGFR has been calculated using the CKD EPI equation. This calculation has not been validated in all clinical situations. eGFR's persistently <60 mL/min signify possible Chronic Kidney Disease.   . Anion gap 06/05/2017 9  5 - 15 Final  . CA 27.29 06/05/2017 114.3* 0.0 - 38.6 U/mL Final   Comment: (NOTE) Bayer Centaur/ACS methodology Performed At: [facility] [place], [place] [id number] Lindon Romp MD XG:[id number]   . CA 15-3 06/05/2017 96.4* 0.0 - 25.0 U/mL Final   Comment: (NOTE) Roche ECLIA  methodology Performed At: [facility] [place], [place] [id number] Lindon Romp MD MG:[id number]        Ardath Sax, MD

## 2017-06-17 ENCOUNTER — Encounter (HOSPITAL_COMMUNITY): Payer: Self-pay | Admitting: Oncology

## 2017-06-17 ENCOUNTER — Encounter (HOSPITAL_BASED_OUTPATIENT_CLINIC_OR_DEPARTMENT_OTHER): Payer: Self-pay | Admitting: Oncology

## 2017-06-17 ENCOUNTER — Encounter (HOSPITAL_BASED_OUTPATIENT_CLINIC_OR_DEPARTMENT_OTHER): Payer: Self-pay

## 2017-06-17 VITALS — BP 109/67 | HR 80 | Temp 98.9°F | Resp 18 | Wt 123.0 lb

## 2017-06-17 DIAGNOSIS — Z5111 Encounter for antineoplastic chemotherapy: Secondary | ICD-10-CM

## 2017-06-17 DIAGNOSIS — Z5189 Encounter for other specified aftercare: Secondary | ICD-10-CM

## 2017-06-17 DIAGNOSIS — Z170 Estrogen receptor positive status [ER+]: Principal | ICD-10-CM

## 2017-06-17 DIAGNOSIS — E876 Hypokalemia: Secondary | ICD-10-CM

## 2017-06-17 DIAGNOSIS — C773 Secondary and unspecified malignant neoplasm of axilla and upper limb lymph nodes: Secondary | ICD-10-CM

## 2017-06-17 DIAGNOSIS — Z5112 Encounter for antineoplastic immunotherapy: Secondary | ICD-10-CM

## 2017-06-17 DIAGNOSIS — C50811 Malignant neoplasm of overlapping sites of right female breast: Secondary | ICD-10-CM

## 2017-06-17 LAB — COMPREHENSIVE METABOLIC PANEL
ALT: 32 U/L (ref 14–54)
ANION GAP: 8 (ref 5–15)
AST: 25 U/L (ref 15–41)
Albumin: 3.8 g/dL (ref 3.5–5.0)
Alkaline Phosphatase: 86 U/L (ref 38–126)
BILIRUBIN TOTAL: 0.5 mg/dL (ref 0.3–1.2)
BUN: 12 mg/dL (ref 6–20)
CHLORIDE: 105 mmol/L (ref 101–111)
CO2: 24 mmol/L (ref 22–32)
Calcium: 9.2 mg/dL (ref 8.9–10.3)
Creatinine, Ser: 0.54 mg/dL (ref 0.44–1.00)
GFR calc Af Amer: >60 mL/min (ref >60)
Glucose, Bld: 166 mg/dL — ABNORMAL HIGH (ref 65–99)
POTASSIUM: 3.3 mmol/L — AB (ref 3.5–5.1)
Sodium: 137 mmol/L (ref 135–145)
TOTAL PROTEIN: 7.1 g/dL (ref 6.5–8.1)

## 2017-06-17 LAB — CBC WITH DIFFERENTIAL/PLATELET
BASOS ABS: 0 10*3/uL (ref 0.0–0.1)
BASOS PCT: 0 %
EOS PCT: 0 %
Eosinophils Absolute: 0 10*3/uL (ref 0.0–0.7)
HCT: 37.1 % (ref 36.0–46.0)
Hemoglobin: 12.4 g/dL (ref 12.0–15.0)
Lymphocytes Relative: 5 %
Lymphs Abs: 0.5 10*3/uL — ABNORMAL LOW (ref 0.7–4.0)
MCH: 32.2 pg (ref 26.0–34.0)
MCHC: 33.4 g/dL (ref 30.0–36.0)
MCV: 96.4 fL (ref 78.0–100.0)
MONO ABS: 0.5 10*3/uL (ref 0.1–1.0)
MONOS PCT: 4 %
Neutro Abs: 10.1 10*3/uL — ABNORMAL HIGH (ref 1.7–7.7)
Neutrophils Relative %: 91 %
PLATELETS: 401 10*3/uL — AB (ref 150–400)
RBC: 3.85 MIL/uL — ABNORMAL LOW (ref 3.87–5.11)
RDW: 14.1 % (ref 11.5–15.5)
WBC: 11.1 10*3/uL — ABNORMAL HIGH (ref 4.0–10.5)

## 2017-06-17 LAB — MAGNESIUM: MAGNESIUM: 1.8 mg/dL (ref 1.7–2.4)

## 2017-06-17 MED ORDER — DIPHENHYDRAMINE HCL 25 MG PO CAPS
50.0000 mg | ORAL_CAPSULE | Freq: Once | ORAL | Status: AC
  Administered 2017-06-17: 50 mg via ORAL
  Filled 2017-06-17: qty 2

## 2017-06-17 MED ORDER — HEPARIN SOD (PORK) LOCK FLUSH 100 UNIT/ML IV SOLN
500.0000 [IU] | Freq: Once | INTRAVENOUS | Status: AC | PRN
  Administered 2017-06-17: 500 [IU]
  Filled 2017-06-17: qty 5

## 2017-06-17 MED ORDER — ACETAMINOPHEN 325 MG PO TABS
650.0000 mg | ORAL_TABLET | Freq: Once | ORAL | Status: AC
Start: 2017-06-17 — End: 2017-06-17
  Administered 2017-06-17: 650 mg via ORAL
  Filled 2017-06-17: qty 2

## 2017-06-17 MED ORDER — POTASSIUM CHLORIDE CRYS ER 20 MEQ PO TBCR
40.0000 meq | EXTENDED_RELEASE_TABLET | Freq: Once | ORAL | Status: AC
  Administered 2017-06-17: 40 meq via ORAL
  Filled 2017-06-17: qty 2

## 2017-06-17 MED ORDER — DOCETAXEL CHEMO INJECTION 160 MG/16ML
75.0000 mg/m2 | Freq: Once | INTRAVENOUS | Status: AC
  Administered 2017-06-17: 120 mg via INTRAVENOUS
  Filled 2017-06-17: qty 12

## 2017-06-17 MED ORDER — PEGFILGRASTIM 6 MG/0.6ML ~~LOC~~ PSKT
6.0000 mg | PREFILLED_SYRINGE | Freq: Once | SUBCUTANEOUS | Status: AC
  Administered 2017-06-17: 6 mg via SUBCUTANEOUS
  Filled 2017-06-17: qty 0.6

## 2017-06-17 MED ORDER — SODIUM CHLORIDE 0.9 % IV SOLN
420.0000 mg | Freq: Once | INTRAVENOUS | Status: AC
  Administered 2017-06-17: 420 mg via INTRAVENOUS
  Filled 2017-06-17: qty 14

## 2017-06-17 MED ORDER — DEXAMETHASONE SODIUM PHOSPHATE 10 MG/ML IJ SOLN
10.0000 mg | Freq: Once | INTRAMUSCULAR | Status: AC
Start: 2017-06-17 — End: 2017-06-17
  Administered 2017-06-17: 10 mg via INTRAVENOUS
  Filled 2017-06-17: qty 1

## 2017-06-17 MED ORDER — SODIUM CHLORIDE 0.9 % IV SOLN: 10.0000 mg | Freq: Once | INTRAVENOUS | Status: DC

## 2017-06-17 MED ORDER — TRASTUZUMAB CHEMO 150 MG IV SOLR
6.0000 mg/kg | Freq: Once | INTRAVENOUS | Status: AC
  Administered 2017-06-17: 336 mg via INTRAVENOUS
  Filled 2017-06-17: qty 16

## 2017-06-17 MED ORDER — SODIUM CHLORIDE 0.9 % IV SOLN
Freq: Once | INTRAVENOUS | Status: AC
  Administered 2017-06-17: 10:00:00 via INTRAVENOUS

## 2017-06-17 MED ORDER — SODIUM CHLORIDE 0.9% FLUSH
10.0000 mL | INTRAVENOUS | Status: DC | PRN
  Administered 2017-06-17: 10 mL
  Filled 2017-06-17: qty 10

## 2017-06-17 MED ORDER — SODIUM CHLORIDE 0.9 % IV SOLN
612.6000 mg | Freq: Once | INTRAVENOUS | Status: AC
  Administered 2017-06-17: 610 mg via INTRAVENOUS
  Filled 2017-06-17: qty 61

## 2017-06-17 MED ORDER — PALONOSETRON HCL INJECTION 0.25 MG/5ML
0.2500 mg | Freq: Once | INTRAVENOUS | Status: AC
  Administered 2017-06-17: 0.25 mg via INTRAVENOUS
  Filled 2017-06-17: qty 5

## 2017-06-17 NOTE — Patient Instructions (Signed)
[facility] Discharge Instructions for Patients Receiving Chemotherapy   Beginning January 23rd 2017 lab work for the [facility] will be done in the  Main lab at [facility] on 1st floor. If you have a lab appointment with the [facility] please come in thru the  Main Entrance and check in at the main information desk   Today you received the following chemotherapy agents Herceptin,Perjeta,Taxotere and Carboplatin as well as Neulasta on-pro. Follow-up as scheduled. Call clinic for any questions or concerns  To help prevent nausea and vomiting after your treatment, we encourage you to take your nausea medication   If you develop nausea and vomiting, or diarrhea that is not controlled by your medication, call the clinic.  The clinic phone number is [phone]. Office hours are Monday-Friday 8:30am-5:00pm.  BELOW ARE SYMPTOMS THAT SHOULD BE REPORTED IMMEDIATELY:  *FEVER GREATER THAN 101.0 F  *CHILLS WITH OR WITHOUT FEVER  NAUSEA AND VOMITING THAT IS NOT CONTROLLED WITH YOUR NAUSEA MEDICATION  *UNUSUAL SHORTNESS OF BREATH  *UNUSUAL BRUISING OR BLEEDING  TENDERNESS IN MOUTH AND THROAT WITH OR WITHOUT PRESENCE OF ULCERS  *URINARY PROBLEMS  *BOWEL PROBLEMS  UNUSUAL RASH Items with * indicate a potential emergency and should be followed up as soon as possible. If you have an emergency after office hours please contact your primary care physician or go to the nearest emergency department.  Please call the clinic during office hours if you have any questions or concerns.   You may also contact the Patient Navigator at [phone] should you have any questions or need assistance in obtaining follow up care.      Resources For Cancer Patients and their Caregivers ? American Cancer Society: Can assist with transportation, wigs, general needs, runs Look Good Feel Better.        304-053-1143 ? Cancer Care: Provides financial assistance, online support  groups, medication/co-pay assistance.  1-800-813-HOPE 814-445-1746) ? [facility] Assists [place] cancer patients and their families through emotional , educational and financial support.  (818) 786-8041 ? [place] DSS Where to apply for food stamps, Medicaid and utility assistance. (716) 850-7053 ? [facility]: Transportation to medical appointments. 305-245-5525 ? Social Security Administration: May apply for disability if have a Stage IV cancer. 3046310131 (747)625-5531 ? [employer], Disability and Transit Services: Assists with nutrition, care and transit needs. [phone]

## 2017-06-17 NOTE — Progress Notes (Signed)
[facility] Initial Visit:  Patient Care Team: Patient, No Pcp Per as PCP - General (General Practice)  CHIEF COMPLAINTS/PURPOSE OF CONSULTATION:  Right multifocal breast cancer  HISTORY OF PRESENTING ILLNESS: Angel Hale 46 y.o.premenopausal female presents today for evaluation of high-grade right breast cancer. Patient states that 2 years ago she initially noted a mass in her right breast, however patient thought that it was a cyst and did not get any evaluation done. Her symptoms have worsened over time and she now has an open lesion which is draining in the inferior right breast along the inframammary fold as well as multiple masses in her breast. Patient is uninsured and does not have a PCP, so she was not receiving regular care.   She underwent a bilateral diagnostic mammogram with ultrasound on 05/12/2017 which demonstrated locally advanced multicentric neglected right breast cancer with a fungating mass along the inframammary fold and diffuse skin thickening. Right axillary and right infraclavicular pathologic lymphadenopathy.   Patient underwent ultrasound-guided biopsy on 05/12/2017 of the right medial lower inner quadrant breast which demonstrated invasive ductal carcinoma grade 3, biopsy of right upper outer quadrant breast demonstrated invasive ductal carcinoma grade 3, biopsy of right axillary lymph node demonstrated lymph node with metastatic carcinoma.  Staging bone scan 05/15/2017 demonstrated no evidence of metastatic disease.   Staging CT chest/abdomen/pelvis 05/15/17 demonstrated numerous right breast masses and metastatic right axillary lymphadenopathy. Findings suspicious for chest wall invasion along the lower lateral aspect of the breast. Two small indeterminate right lower lobe pulmonary nodules will require surveillance. No findings for metastatic disease involving the abdomen/pelvis or osseous structures.   Tumor markers: CA 15-3 on 05/14/2017  was 115.9. CA-27-29 on 05/14/2017 was 157.7.   She is currently on Bactrim for the open wound on her right breast.  Patient's case is complicated in that she had a horseback riding accident in July and underwent an OPEN REDUCTION INTERNAL FIXATION (ORIF) LEFT PILON FRACTURE AND LEFT LATERAL MALLEOLUS on 03/03/17.  However since then, she's had complications with a screw sticking out of her foot. She is seeing her orthopedic surgeon this upcoming Thursday to see one when she will have surgery to get this fixed.  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   She had removal of her protruding hardware in her left ankle on 05/21/2017. She stated the procedure went well and she is not noted any bleeding.   Patient also had a left chest wall chemotherapy port placed on 05/20/2017.   ECHO on 05/18/17 showed an EF of 60-65%.  INTERVAL HISTORY: Patient presents today for continued follow-up and for cycle 2 of chemo with TCH-P.  She tolerated cycle 1 of chemotherapy very well.  She has some fatigue and nausea but otherwise denies any other symptoms from chemo.  She did have low back pain and neck pain from the Neulasta injection.  She stated she had a few bad days post chemotherapy where she was very tired but otherwise her symptoms have resolved.  She is noticed a tremendous improvement in her breast mass since completing cycle 1 of chemotherapy.  She stated that her breasts masses have decreased in size, the ulceration over her open breast wound has improved as well and has minimal drainage now.  Overall her right breast mass is a lot softer.  She states that her appetite is great at 100%.  She denies any pain today.  Her left ankle where she had the hardware removed,  is healing well, however she does have some swelling and redness in that area.  She denies any fevers or chills or recent infections. Overall patient states that she feels well. She denies any chest pain, shortness breath,  abdominal pain, diarrhea, vomiting, dizziness, headaches.  Review of Systems - Oncology ROS as per HPI otherwise 12 point ROS is negative.  MEDICAL HISTORY: Past Medical History:  Diagnosis Date  . Cancer [facility])    breast cancer    SURGICAL HISTORY: Past Surgical History:  Procedure Laterality Date  . BUNIONECTOMY Left 2000  . EXTERNAL FIXATION LEG Left 02/16/2017   Procedure: EXTERNAL FIXATION LEG;  Surgeon: Mcarthur Rossetti, MD;  Location: [place];  Service: Orthopedics;  Laterality: Left;  . HARDWARE REMOVAL Left 05/21/2017   Procedure: REMOVAL OF RETAINED PROMINENT SCREW LEFT ANKLE;  Surgeon: Mcarthur Rossetti, MD;  Location: [place];  Service: Orthopedics;  Laterality: Left;  . ORIF ANKLE FRACTURE Left 03/03/2017  . ORIF ANKLE FRACTURE Left 03/03/2017   Procedure: OPEN REDUCTION INTERNAL FIXATION (ORIF) LEFT PILON FRACTURE AND LEFT LATERAL MALLEOLUS;  Surgeon: Mcarthur Rossetti, MD;  Location: [place];  Service: Orthopedics;  Laterality: Left;  . OVARIAN CYST REMOVAL Left 1991  . PORTACATH PLACEMENT    . PORTACATH PLACEMENT Left 05/20/2017   Procedure: INSERTION PORT-A-CATH;  Surgeon: Virl Cagey, MD;  Location: [facility];  Service: General;  Laterality: Left;  . WISDOM TOOTH EXTRACTION      SOCIAL HISTORY: Social History   Social History  . Marital status: Married    Spouse name: N/A  . Number of children: N/A  . Years of education: N/A   Occupational History  . Not on file.   Social History Main Topics  . Smoking status: Former Smoker    Packs/day: 1.00    Years: 10.00    Types: Cigarettes    Quit date: 02/16/2017  . Smokeless tobacco: Never Used  . Alcohol use Yes     Comment: occasional  . Drug use: No  . Sexual activity: Not Currently    Birth control/ protection: None   Other Topics Concern  . Not on file   Social History Narrative  . No narrative on file    FAMILY HISTORY Family History  Problem Relation Age of Onset  . Colon  cancer Father     ALLERGIES:  is allergic to penicillins.  MEDICATIONS:  Current Outpatient Prescriptions  Medication Sig Dispense Refill  . acetaminophen (TYLENOL) 500 MG tablet Take 1,000 mg by mouth every 6 (six) hours as needed (for pain.).    Marland Kitchen CARBOPLATIN IV Inject into the vein. Every 3 weeks    . Coenzyme Q10 (CO Q-10 PO) Take 1 tablet by mouth daily.    . Cyanocobalamin (B-12 PO) Take 1 tablet by mouth daily.    Marland Kitchen dexamethasone (DECADRON) 4 MG tablet Take 2 tablets (8 mg total) by mouth 2 (two) times daily. Start the day before Taxotere. Then again the day after chemo for 3 days. 30 tablet 1  . DOCEtaxel (TAXOTERE IV) Inject into the vein. Every 3 weeks    . Homeopathic Products (FRANKINCENSE UPLIFTING) OIL Take 1 capsule by mouth daily as needed (for home remedy).    Marland Kitchen HYDROcodone-acetaminophen (NORCO) 5-325 MG tablet Take 1 tablet by mouth every 4 (four) hours as needed for moderate pain. 6 tablet 0  . LAVENDER OIL PO Take 1 capsule by mouth daily as needed (for home remedy).    Marland Kitchen lidocaine-prilocaine (EMLA)  cream Apply to affected area once 30 g 3  . LORazepam (ATIVAN) 0.5 MG tablet Take 1 tablet (0.5 mg total) by mouth every 6 (six) hours as needed (Nausea or vomiting). 30 tablet 0  . methocarbamol (ROBAXIN) 500 MG tablet Take 1 tablet (500 mg total) by mouth every 6 (six) hours as needed for muscle spasms. 40 tablet 1  . Multiple Vitamins-Minerals (ZINC PO) Take 1 capsule by mouth daily.    . ondansetron (ZOFRAN) 8 MG tablet Take 1 tablet (8 mg total) by mouth 2 (two) times daily as needed for refractory nausea / vomiting. Start on day 3 after chemo. 30 tablet 1  . Pegfilgrastim (NEULASTA ONPRO [place]) Inject into the skin. Every 3 weeks    . Pertuzumab (PERJETA IV) Inject into the vein. Every 3 weeks    . prochlorperazine (COMPAZINE) 10 MG tablet Take 1 tablet (10 mg total) by mouth every 6 (six) hours as needed (Nausea or vomiting). 30 tablet 1  . sulfamethoxazole-trimethoprim  (BACTRIM DS,SEPTRA DS) 800-160 MG tablet Take 2 tablets by mouth 2 (two) times daily.    . Trastuzumab (HERCEPTIN IV) Inject into the vein. Every 3 weeks    . vitamin C (ASCORBIC ACID) 500 MG tablet Take 1 tablet (500 mg total) by mouth daily. 90 tablet 0   No current facility-administered medications for this visit.    Facility-Administered Medications Ordered in Other Visits  Medication Dose Route Frequency Provider Last Rate Last Dose  . CARBOplatin (PARAPLATIN) 610 mg in sodium chloride 0.9 % 250 mL chemo infusion  610 mg Intravenous Once Twana First, MD      . DOCEtaxel (TAXOTERE) 120 mg in dextrose 5 % 250 mL chemo infusion  75 mg/m2 (Treatment Plan Recorded) Intravenous Once Twana First, MD      . heparin lock flush 100 unit/mL  500 Units Intracatheter Once PRN Twana First, MD      . pegfilgrastim (NEULASTA ONPRO KIT) injection 6 mg  6 mg Subcutaneous Once Twana First, MD      . pertuzumab (PERJETA) 420 mg in sodium chloride 0.9 % 250 mL chemo infusion  420 mg Intravenous Once Twana First, MD      . sodium chloride flush (NS) 0.9 % injection 10 mL  10 mL Intracatheter PRN Twana First, MD   10 mL at 06/17/17 0855  . trastuzumab (HERCEPTIN) 336 mg in sodium chloride 0.9 % 250 mL chemo infusion  6 mg/kg (Treatment Plan Recorded) Intravenous Once Twana First, MD        PHYSICAL EXAMINATION:  ECOG PERFORMANCE STATUS: 0 - Asymptomatic   Blood pressure 141/89, pulse 103 respiratory rate 18 temp 98.1 O2 sat 100%.  Weight 123lbs.  Physical Exam Constitutional: Well-developed, well-nourished, and in no distress.   HENT:  Head: Normocephalic and atraumatic.  Mouth/Throat: No oropharyngeal exudate. Mucosa moist. Eyes: Pupils are equal, round, and reactive to light. Conjunctivae are normal. No scleral icterus.  Neck: Normal range of motion. Neck supple. No JVD present.  Cardiovascular: Normal rate, regular rhythm and normal heart sounds.  Exam reveals no gallop and no friction rub.    No murmur heard. Pulmonary/Chest: Effort normal and breath sounds normal. No respiratory distress. No wheezes.No rales.  Abdominal: Soft. Bowel sounds are normal. No distension. There is no tenderness. There is no guarding.  Musculoskeletal: No edema or tenderness.  Lymphadenopathy:    No cervical or supraclavicular adenopathy.  Neurological: Alert and oriented to person, place, and time. No cranial nerve deficit.  Skin:  Skin is warm and dry. No rash noted. No erythema. No pallor.  Psychiatric: Affect and judgment normal.  BREAST: right breast: peau d'orange gone, firm mass palpated on the RUO quadrant of her breast has decreased in size, breast is a lot softer in the RLQ with an open wound in the 6 o'clock position measuring 2 cm, however minimal drainage. +Right axillary lymphadenopathy. Left breast without masses, skin changes, nipple discharge, or axillary lymphadenopathy.  LABORATORY DATA: I have personally reviewed the data as listed:  Infusion on 06/17/2017  Component Date Value Ref Range Status  . WBC 06/17/2017 11.1* 4.0 - 10.5 K/uL Final  . RBC 06/17/2017 3.85* 3.87 - 5.11 MIL/uL Final  . Hemoglobin 06/17/2017 12.4  12.0 - 15.0 g/dL Final  . HCT 06/17/2017 37.1  36.0 - 46.0 % Final  . MCV 06/17/2017 96.4  78.0 - 100.0 fL Final  . MCH 06/17/2017 32.2  26.0 - 34.0 pg Final  . MCHC 06/17/2017 33.4  30.0 - 36.0 g/dL Final  . RDW 06/17/2017 14.1  11.5 - 15.5 % Final  . Platelets 06/17/2017 401* 150 - 400 K/uL Final  . Neutrophils Relative % 06/17/2017 91  % Final  . Neutro Abs 06/17/2017 10.1* 1.7 - 7.7 K/uL Final  . Lymphocytes Relative 06/17/2017 5  % Final  . Lymphs Abs 06/17/2017 0.5* 0.7 - 4.0 K/uL Final  . Monocytes Relative 06/17/2017 4  % Final  . Monocytes Absolute 06/17/2017 0.5  0.1 - 1.0 K/uL Final  . Eosinophils Relative 06/17/2017 0  % Final  . Eosinophils Absolute 06/17/2017 0.0  0.0 - 0.7 K/uL Final  . Basophils Relative 06/17/2017 0  % Final  . Basophils  Absolute 06/17/2017 0.0  0.0 - 0.1 K/uL Final  . Sodium 06/17/2017 137  135 - 145 mmol/L Final  . Potassium 06/17/2017 3.3* 3.5 - 5.1 mmol/L Final  . Chloride 06/17/2017 105  101 - 111 mmol/L Final  . CO2 06/17/2017 24  22 - 32 mmol/L Final  . Glucose, Bld 06/17/2017 166* 65 - 99 mg/dL Final  . BUN 06/17/2017 12  6 - 20 mg/dL Final  . Creatinine, Ser 06/17/2017 0.54  0.44 - 1.00 mg/dL Final  . Calcium 06/17/2017 9.2  8.9 - 10.3 mg/dL Final  . Total Protein 06/17/2017 7.1  6.5 - 8.1 g/dL Final  . Albumin 06/17/2017 3.8  3.5 - 5.0 g/dL Final  . AST 06/17/2017 25  15 - 41 U/L Final  . ALT 06/17/2017 32  14 - 54 U/L Final  . Alkaline Phosphatase 06/17/2017 86  38 - 126 U/L Final  . Total Bilirubin 06/17/2017 0.5  0.3 - 1.2 mg/dL Final  . GFR calc non Af Amer 06/17/2017 >60  >60 mL/min Final  . GFR calc Af Amer 06/17/2017 >60  >60 mL/min Final   Comment: (NOTE) The eGFR has been calculated using the CKD EPI equation. This calculation has not been validated in all clinical situations. eGFR's persistently <60 mL/min signify possible Chronic Kidney Disease.   . Anion gap 06/17/2017 8  5 - 15 Final  . Magnesium 06/17/2017 1.8  1.7 - 2.4 mg/dL Final  Appointment on 06/05/2017  Component Date Value Ref Range Status  . WBC 06/05/2017 33.1* 4.0 - 10.5 K/uL Final   WHITE COUNT CONFIRMED ON SMEAR  . RBC 06/05/2017 4.10  3.87 - 5.11 MIL/uL Final  . Hemoglobin 06/05/2017 13.5  12.0 - 15.0 g/dL Final  . HCT 06/05/2017 39.9  36.0 - 46.0 % Final  .  MCV 06/05/2017 97.3  78.0 - 100.0 fL Final  . MCH 06/05/2017 32.9  26.0 - 34.0 pg Final  . MCHC 06/05/2017 33.8  30.0 - 36.0 g/dL Final  . RDW 06/05/2017 13.8  11.5 - 15.5 % Final  . Platelets 06/05/2017 226  150 - 400 K/uL Final  . Neutrophils Relative % 06/05/2017 92  % Final  . Lymphocytes Relative 06/05/2017 4  % Final  . Monocytes Relative 06/05/2017 4  % Final  . Eosinophils Relative 06/05/2017 0  % Final  . Basophils Relative 06/05/2017 0  %  Final  . Neutro Abs 06/05/2017 30.5* 1.7 - 7.7 K/uL Final  . Lymphs Abs 06/05/2017 1.3  0.7 - 4.0 K/uL Final  . Monocytes Absolute 06/05/2017 1.3* 0.1 - 1.0 K/uL Final  . Eosinophils Absolute 06/05/2017 0.0  0.0 - 0.7 K/uL Final  . Basophils Absolute 06/05/2017 0.0  0.0 - 0.1 K/uL Final  . WBC Morphology 06/05/2017 VACUOLATED NEUTROPHILS   Final   Comment: ATYPICAL LYMPHOCYTES MILD LEFT SHIFT (1-5% METAS, OCC MYELO, OCC BANDS)   . Sodium 06/05/2017 139  135 - 145 mmol/L Final  . Potassium 06/05/2017 3.6  3.5 - 5.1 mmol/L Final  . Chloride 06/05/2017 100* 101 - 111 mmol/L Final  . CO2 06/05/2017 30  22 - 32 mmol/L Final  . Glucose, Bld 06/05/2017 88  65 - 99 mg/dL Final  . BUN 06/05/2017 11  6 - 20 mg/dL Final  . Creatinine, Ser 06/05/2017 0.68  0.44 - 1.00 mg/dL Final  . Calcium 06/05/2017 9.1  8.9 - 10.3 mg/dL Final  . Total Protein 06/05/2017 7.2  6.5 - 8.1 g/dL Final  . Albumin 06/05/2017 3.9  3.5 - 5.0 g/dL Final  . AST 06/05/2017 46* 15 - 41 U/L Final  . ALT 06/05/2017 60* 14 - 54 U/L Final  . Alkaline Phosphatase 06/05/2017 143* 38 - 126 U/L Final  . Total Bilirubin 06/05/2017 0.3  0.3 - 1.2 mg/dL Final  . GFR calc non Af Amer 06/05/2017 >60  >60 mL/min Final  . GFR calc Af Amer 06/05/2017 >60  >60 mL/min Final   Comment: (NOTE) The eGFR has been calculated using the CKD EPI equation. This calculation has not been validated in all clinical situations. eGFR's persistently <60 mL/min signify possible Chronic Kidney Disease.   . Anion gap 06/05/2017 9  5 - 15 Final  . CA 27.29 06/05/2017 114.3* 0.0 - 38.6 U/mL Final   Comment: (NOTE) Bayer Centaur/ACS methodology Performed At: [facility] [place], [place] [id number] Lindon Romp MD TO:[id number]   . CA 15-3 06/05/2017 96.4* 0.0 - 25.0 U/mL Final   Comment: (NOTE) Roche ECLIA  methodology Performed At: [facility] [place], [place] [id number] Lindon Romp MD  [id number]:[id number]   Infusion on 05/27/2017  Component Date Value Ref Range Status  . WBC 05/27/2017 14.5* 4.0 - 10.5 K/uL Final  . RBC 05/27/2017 4.04  3.87 - 5.11 MIL/uL Final  . Hemoglobin 05/27/2017 13.2  12.0 - 15.0 g/dL Final  . HCT 05/27/2017 38.7  36.0 - 46.0 % Final  . MCV 05/27/2017 95.8  78.0 - 100.0 fL Final  . MCH 05/27/2017 32.7  26.0 - 34.0 pg Final  . MCHC 05/27/2017 34.1  30.0 - 36.0 g/dL Final  . RDW 05/27/2017 13.0  11.5 - 15.5 % Final  . Platelets 05/27/2017 269  150 - 400 K/uL Final  . Neutrophils Relative % 05/27/2017 84  % Final  .  Neutro Abs 05/27/2017 12.3* 1.7 - 7.7 K/uL Final  . Lymphocytes Relative 05/27/2017 9  % Final  . Lymphs Abs 05/27/2017 1.2  0.7 - 4.0 K/uL Final  . Monocytes Relative 05/27/2017 7  % Final  . Monocytes Absolute 05/27/2017 1.0  0.1 - 1.0 K/uL Final  . Eosinophils Relative 05/27/2017 0  % Final  . Eosinophils Absolute 05/27/2017 0.0  0.0 - 0.7 K/uL Final  . Basophils Relative 05/27/2017 0  % Final  . Basophils Absolute 05/27/2017 0.0  0.0 - 0.1 K/uL Final  . Sodium 05/27/2017 136  135 - 145 mmol/L Final  . Potassium 05/27/2017 3.4* 3.5 - 5.1 mmol/L Final  . Chloride 05/27/2017 105  101 - 111 mmol/L Final  . CO2 05/27/2017 24  22 - 32 mmol/L Final  . Glucose, Bld 05/27/2017 94  65 - 99 mg/dL Final  . BUN 05/27/2017 8  6 - 20 mg/dL Final  . Creatinine, Ser 05/27/2017 0.56  0.44 - 1.00 mg/dL Final  . Calcium 05/27/2017 9.1  8.9 - 10.3 mg/dL Final  . Total Protein 05/27/2017 6.8  6.5 - 8.1 g/dL Final  . Albumin 05/27/2017 3.6  3.5 - 5.0 g/dL Final  . AST 05/27/2017 22  15 - 41 U/L Final  . ALT 05/27/2017 17  14 - 54 U/L Final  . Alkaline Phosphatase 05/27/2017 58  38 - 126 U/L Final  . Total Bilirubin 05/27/2017 0.5  0.3 - 1.2 mg/dL Final  . GFR calc non Af Amer 05/27/2017 >60  >60 mL/min Final  . GFR calc Af Amer 05/27/2017 >60  >60 mL/min Final   Comment: (NOTE) The eGFR has been calculated using the CKD EPI equation. This  calculation has not been validated in all clinical situations. eGFR's persistently <60 mL/min signify possible Chronic Kidney Disease.   . Anion gap 05/27/2017 7  5 - 15 Final  . CA 27.29 05/27/2017 130.9* 0.0 - 38.6 U/mL Final   Comment: (NOTE) Bayer Centaur/ACS methodology Performed At: [facility] [place], [place] [id number] Lindon Romp MD QM:[id number]   . CA 15-3 05/27/2017 107.6* 0.0 - 25.0 U/mL Final   Comment: (NOTE) Roche ECLIA  methodology Performed At: [facility] [place], [place] [id number] Lindon Romp MD [id number]   Admission on 05/20/2017, Discharged on 05/20/2017  Component Date Value Ref Range Status  . Preg, Serum 05/20/2017 NEGATIVE  NEGATIVE Final   Comment:        THE SENSITIVITY OF THIS METHODOLOGY IS >10 mIU/mL.     RADIOGRAPHIC STUDIES: I have personally reviewed the radiological images as listed and agree with the findings in the report  No results found. PATHOLOGY: Diagnosis 1. Breast, right, needle core biopsy, medial lower inner quadrant - INVASIVE DUCTAL CARCINOMA, GRADE 3. - SEE MICROSCOPIC DESCRIPTION. 2. Breast, right, needle core biopsy, upper outer quadrant - INVASIVE DUCTAL CARCINOMA, GRADE 3. - SEE MICROSCOPIC DESCRIPTION. 3. Lymph node, needle/core biopsy, right axillary - LYMPH NODE WITH METASTATIC CARCINOMA. Microscopic Comment 1. -3. Breast prognostic profile will be performed. Called to Dr. Radford Pax on 05/14/17. (JDP:gt, 05/14/17)  ASSESSMENT/PLAN: Cancer Staging Malignant neoplasm of overlapping sites of right female breast [facility]) Staging form: Breast, AJCC 8th Edition - Clinical: No Stage Recommended (ycT4b, cN3a, cM0, G3, ER: Positive, PR: Positive, HER2: Positive) - Signed by Twana First, MD on 05/25/2017  PLAN: -Patient has had a great response already to just one cycle of TCH-P with improvement in the peau d'orange and decrease in  size of breast masses.   -Continue neoadjuvant chemotherapy with TCH-P q21 days for 6 cycles in detail with the patient. Patient is tolerating treatment well. -Labs reviewed today. Ok to proceed with cycle 2 of chemo. -RTC in 3 weeks for cycle 3 of chemo.  All questions were answered. The patient knows to call the clinic with any problems, questions or concerns.  This note was electronically signed.    Twana First, MD  06/17/2017 10:12 AM

## 2017-06-17 NOTE — Patient Instructions (Signed)
[facility] at [facility] Discharge Instructions  RECOMMENDATIONS MADE BY THE CONSULTANT AND ANY TEST RESULTS WILL BE SENT TO YOUR REFERRING PHYSICIAN.  You were seen today by Dr. Louise Zhou    Thank you for choosing [facility] at [facility] to provide your oncology and hematology care.  To afford each patient quality time with our provider, please arrive at least 15 minutes before your scheduled appointment time.    If you have a lab appointment with the Cancer Center please come in thru the  Main Entrance and check in at the main information desk  You need to re-schedule your appointment should you arrive 10 or more minutes late.  We strive to give you quality time with our providers, and arriving late affects you and other patients whose appointments are after yours.  Also, if you no show three or more times for appointments you may be dismissed from the clinic at the providers discretion.     Again, thank you for choosing [facility].  Our hope is that these requests will decrease the amount of time that you wait before being seen by our physicians.       _____________________________________________________________  Should you have questions after your visit to [facility], please contact our office at [phone] between the hours of 8:30 a.m. and 4:30 p.m.  Voicemails left after 4:30 p.m. will not be returned until the following business day.  For prescription refill requests, have your pharmacy contact our office.       Resources For Cancer Patients and their Caregivers ? American Cancer Society: Can assist with transportation, wigs, general needs, runs Look Good Feel Better.        1-888-227-6333 ? Cancer Care: Provides financial assistance, online support groups, medication/co-pay assistance.  1-800-813-HOPE (4673) ? [facility] Assists [place] cancer patients and their  families through emotional , educational and financial support.  336-427-4357 ? [place] DSS Where to apply for food stamps, Medicaid and utility assistance. 336-342-1394 ? [facility]: Transportation to medical appointments. 336-347-2287 ? Social Security Administration: May apply for disability if have a Stage IV cancer. 336-342-7796 1-800-772-1213 ? [place] Aging, Disability and Transit Services: Assists with nutrition, care and transit needs. 336-349-2343  Cancer Center Support Programs: @10RELATIVEDAYS@ > Cancer Support Group  2nd Tuesday of the month 1pm-2pm, Journey Room  > Creative Journey  3rd Tuesday of the month 1130am-1pm, Journey Room  > Look Good Feel Better  1st Wednesday of the month 10am-12 noon, Journey Room (Call American Cancer Society to register 1-800-395-5775)    

## 2017-06-17 NOTE — Progress Notes (Signed)
Angel Hale tolerated chemo tx and application of Neulasta on-pro well without complaints or incident. Labs reviewed with Dr. Talbert Cage prior to administering chemotherapy. Pt given Potassium 40 mcq PO today per MD order for Potassium of 3.3. Neulasta on-pro applied to pt's arm with green indicator light flashing upon discharge. VSS upon discharge. Pt discharged self ambulatory in satisfactory condition accompanied by her husband

## 2017-06-19 ENCOUNTER — Telehealth (HOSPITAL_COMMUNITY): Payer: Self-pay

## 2017-06-19 ENCOUNTER — Telehealth: Payer: Self-pay | Admitting: General Surgery

## 2017-06-19 NOTE — Telephone Encounter (Signed)
Patient called stating her port was sore. It is not red, warm, swollen, or draining. Denies fever. She states it feels better since when she called and left message. Patient also says when she went to the restroom she had some blood on the tissue when she wiped. She states she has been constipated some.. Reviewed with RN and NP. Instructed patient that ports are sometimes sore after use and she may have bumped it and not realized it. Also, concerning the blood after BM, she could have a hemorrhoid or little tear or fissure. Instructed patient to not get constipated. Also if the port a cath pain got worse, she starts running a fever or if she notices more bleeding than she needs to call the cancer center of go to the ER after hours. Patient verbalized understanding.

## 2017-06-19 NOTE — Telephone Encounter (Signed)
Called patient to check on her. Said she is doing well. Wound is good smaller. Tolerating the chemotherapy.   Will keep in touch until seeing her again in office.   Curlene Labrum, MD [facility] [address] [place], [place] [address] [phone] (office)

## 2017-07-07 NOTE — Progress Notes (Signed)
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 Ki67 80%.        05/12/2017 Procedure    Additional (R) breast biopsy (UOQ): IDC, grade 3; ER+ (80%), PR+ (50%), HER2+ (ratio 2.77), Ki67 90%.       05/12/2017 Procedure    (R) axillary lymph node biopsy: Metastatic carcinoma; ER+ (80%), PR+ (30%), HER2+ (ratio 2.99), Ki67 80%.       05/12/2017 Imaging    Mammogram/breast US: Targeted ultrasound is performed, showing multiple irregular hypoechoic masses. In the 6 o'clock region of the right breast is a large irregular mass spans at least 4.8 cm. In the 4:00 to 5:00 region of the right breast an irregular mass measures 4.5 cm. In the 930-10 o'clock position of the right breast centered 9-10 cm from the nipple is an irregular 4.0 x 3.8 x 1.9 cm hypoechoic mass. In the 9:30 position of the right breast at the lateral margin is a probable abnormal intramammary lymph node measuring 1.3 cm greatest diameter. In the 10 o'clock position of the right breast far lateral is a hypoechoic dermal nodule that measures 0.5 cm. Diffuse skin thickening of the right breast measures up to 4.5 mm.      05/14/2017 Tumor Marker      Ref. Range 05/14/2017 15:30  CA 15-3 Latest Ref Range: 0.0 - 25.0 U/mL 115.9 (H)     Ref. Range 05/14/2017 15:30  CA 27.29 Latest Ref Range: 0.0 - 38.6 U/mL 157.7 (H)        05/15/2017 Imaging    CT chest/abd/pelvis:  IMPRESSION: 1.  Numerous right breast masses and metastatic right axillary lymphadenopathy. Findings suspicious for chest wall invasion along the lower lateral aspect of the breast. 2. Two small indeterminate right lower lobe pulmonary nodules will require surveillance. 3. No findings for metastatic disease involving the abdomen/pelvis or osseous structures.      05/15/2017 Imaging    Bone scan:  IMPRESSION: No evidence of metastatic disease.      05/18/2017 Echocardiogram    LV EF: 60% -   65%      05/20/2017 Procedure    Port-a-cath placement       05/25/2017 Initial Diagnosis    Malignant neoplasm of overlapping sites of right breast in female, estrogen receptor positive ([place])      05/27/2017 -  Neo-Adjuvant Chemotherapy    TCHP         HISTORY OF PRESENT ILLNESS:  (From Dr. Laverle Patter note on 06/17/17)  Angel Hale 46 y.o.premenopausal female presents today for evaluation of high-grade right breast cancer. Patient states that 2 years ago she initially noted a mass in her right breast, however patient thought that it was a cyst and did not get any evaluation done. Her symptoms have worsened over time and she now has an open lesion which is draining in the inferior right breast along the inframammary fold as well as multiple masses in her breast. Patient is uninsured and does  not have a PCP, so she was not receiving regular care.   She underwent a bilateral diagnostic mammogram with ultrasound on 05/12/2017 which demonstrated locally advanced multicentric neglected right breast cancer with a fungating mass along the inframammary fold and diffuse skin thickening. Right axillary and right infraclavicular pathologic lymphadenopathy.   Patient underwent ultrasound-guided biopsy on 05/12/2017 of the right medial lower inner quadrant breast which demonstrated invasive ductal carcinoma grade 3, biopsy of right upper outer quadrant breast demonstrated invasive ductal carcinoma grade 3, biopsy of  right axillary lymph node demonstrated lymph node with metastatic carcinoma.  Staging bone scan 05/15/2017 demonstrated no evidence of metastatic disease.   Staging CT chest/abdomen/pelvis 05/15/17 demonstrated numerous right breast masses and metastatic right axillary lymphadenopathy. Findings suspicious for chest wall invasion along the lower lateral aspect of the breast. Two small indeterminate right lower lobe pulmonary nodules will require surveillance. No findings for metastatic disease involving the abdomen/pelvis or osseous structures.   Tumor markers: CA 15-3 on 05/14/2017 was 115.9. CA-27-29 on 05/14/2017 was 157.7.   She is currently on Bactrim for the open wound on her right breast.  Patient's case is complicated in that she had a horseback riding accident in July and underwent an OPEN REDUCTION INTERNAL FIXATION (ORIF) LEFT PILON FRACTURE AND LEFT LATERAL MALLEOLUS on 03/03/17.  However since then, she's had complications with a screw sticking out of her foot. She is seeing her orthopedic surgeon this upcoming Thursday to see one when she will have surgery to get this fixed.  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   She had removal of her protruding hardware in her left ankle on 05/21/2017. She stated the procedure went well and she is not noted any bleeding.   Patient also had a left chest wall chemotherapy port placed on 05/20/2017.   ECHO on 05/18/17 showed an EF of 60-65%.     INTERVAL HISTORY:  Angel Hale 46 y.o. female returns for routine follow-up for right breast cancer.   Due for cycle #3 chemo with TCHP today. Here with her husband.   Overall, she tells me she has been feeling very well. Appetite and energy levels both 100%.  She states that she has a few days during each cycle of treatment when she feels more tired/fatigued, then her energy returns and she feels great.  Her biggest concern today is bone pain that she  experiences after the Neulasta injection.  She took 1/2 of a Percocet she tells me she had leftover from recent leg fracture; this helped her pain, but then she had constipation and had slight rectal bleeding with hard stool (which has now resolved).    She reports some pain at the port-a-cath site. Denies any fever/chills, skin redness or wounds. Notes that it is sore at times, particularly in certain positions and with certain activities.    Reports that her gums feels a bit inflamed after chemo; she does salt/baking soda rinses which are helpful.  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   She tells me that she can no longer feel any mass in her right breast; she states that the wound is much better and healed well.  She is pleased with her response to treatment thus far.    Overall, she feels ready for next cycle of chemotherapy today.      REVIEW OF SYSTEMS:  Review of Systems  Constitutional: Negative for chills and fever.  HENT:  Negative.   Eyes: Negative.   Respiratory: Negative.   Cardiovascular: Negative.   Gastrointestinal: Positive for constipation. Negative for diarrhea, nausea and vomiting.  Endocrine: Negative.   Genitourinary: Negative for dysuria and hematuria.   Musculoskeletal: Positive for arthralgias and back pain.  Skin: Negative.   Neurological: Positive for numbness.  Hematological: Negative.   Psychiatric/Behavioral: Negative.      PAST MEDICAL/SURGICAL HISTORY:  Past Medical History:  Diagnosis Date  . Cancer [facility])    breast cancer   Past Surgical History:  Procedure Laterality Date  . BUNIONECTOMY Left 2000  . ORIF ANKLE FRACTURE Left 03/03/2017  . OVARIAN CYST REMOVAL Left 1991  . PORTACATH PLACEMENT    . WISDOM TOOTH EXTRACTION       SOCIAL HISTORY:  Social History   Socioeconomic History  . Marital status: Married    Spouse name: Not on file  . Number of children: Not on file  . Years of  education: Not on file  . Highest education level: Not on file  Social Needs  . Financial resource strain: Not on file  . Food insecurity - worry: Not on file  . Food insecurity - inability: Not on file  . Transportation needs - medical: Not on file  . Transportation needs - non-medical: Not on file  Occupational History  . Not on file  Tobacco Use  . Smoking status: Former Smoker    Packs/day: 1.00    Years: 10.00    Pack years: 10.00    Types: Cigarettes    Last attempt to quit: 02/16/2017    Years since quitting: 0.3  . Smokeless tobacco: Never Used  Substance and Sexual Activity  . Alcohol use: Yes    Comment: occasional  . Drug use: No  . Sexual activity: Not Currently    Birth control/protection: None  Other Topics Concern  . Not on file  Social History Narrative  . Not on file    FAMILY HISTORY:  Family History  Problem Relation Age of Onset  . Colon cancer Father     CURRENT MEDICATIONS:  Outpatient Encounter Medications as of 07/08/2017  Medication Sig Note  . acetaminophen (TYLENOL) 500 MG tablet Take 1,000 mg by mouth every 6 (six) hours as needed (for pain.).   Marland Kitchen CARBOPLATIN IV Inject into the vein. Every 3 weeks   . Coenzyme Q10 (CO Q-10 PO) Take 1 tablet by mouth daily.   . Cyanocobalamin (B-12 PO) Take 1 tablet by mouth daily.   Marland Kitchen dexamethasone (DECADRON) 4 MG tablet Take 2 tablets (8 mg total) by mouth 2 (two) times daily. Start the day before Taxotere. Then again the day after chemo for 3 days.   . DOCEtaxel (TAXOTERE IV) Inject into the vein. Every 3 weeks   . Homeopathic Products (FRANKINCENSE UPLIFTING) OIL Take 1 capsule by mouth daily as needed (for home remedy).   Marland Kitchen HYDROcodone-acetaminophen (NORCO) 5-325 MG tablet Take 1 tablet by mouth every 4 (four) hours as needed for moderate pain.   Marland Kitchen LAVENDER OIL PO Take 1 capsule by mouth daily as needed (for home remedy).   Marland Kitchen lidocaine-prilocaine (EMLA) cream Apply to affected area once   . LORazepam  (ATIVAN) 0.5 MG tablet Take 1 tablet (0.5 mg total) by mouth every 6 (six) hours as needed (Nausea or vomiting).   . methocarbamol (ROBAXIN) 500 MG tablet Take 1 tablet (500 mg total) by mouth every 6 (six) hours as needed for muscle spasms.   . Multiple  Vitamins-Minerals (ZINC PO) Take 1 capsule by mouth daily.   . ondansetron (ZOFRAN) 8 MG tablet Take 1 tablet (8 mg total) by mouth 2 (two) times daily as needed for refractory nausea / vomiting. Start on day 3 after chemo.   Marland Kitchen Pegfilgrastim (NEULASTA ONPRO [place]) Inject into the skin. Every 3 weeks   . Pertuzumab (PERJETA IV) Inject into the vein. Every 3 weeks   . prochlorperazine (COMPAZINE) 10 MG tablet Take 1 tablet (10 mg total) by mouth every 6 (six) hours as needed (Nausea or vomiting).   . Trastuzumab (HERCEPTIN IV) Inject into the vein. Every 3 weeks   . vitamin C (ASCORBIC ACID) 500 MG tablet Take 1 tablet (500 mg total) by mouth daily.   . [DISCONTINUED] sulfamethoxazole-trimethoprim (BACTRIM DS,SEPTRA DS) 800-160 MG tablet Take 2 tablets by mouth 2 (two) times daily. 05/15/2017: 10 day therapy course patient began on 05/11/2017   No facility-administered encounter medications on file as of 07/08/2017.     ALLERGIES:  Allergies  Allergen Reactions  . Penicillins Nausea And Vomiting    Has patient had a PCN reaction causing immediate rash, facial/tongue/throat swelling, SOB or lightheadedness with hypotension: No Has patient had a PCN reaction causing severe rash involving mucus membranes or skin necrosis: No Has patient had a PCN reaction that required hospitalization: No Has patient had a PCN reaction occurring within the last 10 years: No If all of the above answers are "NO", then may proceed with Cephalosporin use.      PHYSICAL EXAM:  ECOG Performance status: 0-1 - Mild symptoms; remains independent      Physical Exam  Constitutional: She is oriented to person, place, and time and well-developed, well-nourished, and in  no distress.  Seen in chemo bed in infusion area   HENT:  Head: Normocephalic.  Mouth/Throat: Oropharynx is clear and moist.  Eyes: Conjunctivae are normal. No scleral icterus.  Neck: Normal range of motion. Neck supple.  Cardiovascular: Normal rate and regular rhythm.  Pulmonary/Chest: Effort normal and breath sounds normal. No respiratory distress.  Abdominal: Soft. Bowel sounds are normal. There is no tenderness. There is no rebound.  Musculoskeletal: Normal range of motion. She exhibits no edema.  Lymphadenopathy:    She has no cervical adenopathy.       Right: No supraclavicular adenopathy present.       Left: No supraclavicular adenopathy present.  Neurological: She is alert and oriented to person, place, and time. No cranial nerve deficit.  Skin: Skin is warm and dry. No rash noted.  Psychiatric: Mood, memory, affect and judgment normal.  Nursing note and vitals reviewed.    LABORATORY DATA:  I have reviewed the labs as listed.  CBC    Component Value Date/Time   WBC 12.7 (H) 07/08/2017 0949   RBC 3.82 (L) 07/08/2017 0949   HGB 12.7 07/08/2017 0949   HCT 36.9 07/08/2017 0949   PLT 349 07/08/2017 0949   MCV 96.6 07/08/2017 0949   MCH 33.2 07/08/2017 0949   MCHC 34.4 07/08/2017 0949   RDW 15.3 07/08/2017 0949   LYMPHSABS 1.2 07/08/2017 0949   MONOABS 0.8 07/08/2017 0949   EOSABS 0.0 07/08/2017 0949   BASOSABS 0.0 07/08/2017 0949   CMP Latest Ref Rng & Units 07/08/2017 06/17/2017 06/05/2017  Glucose 65 - 99 mg/dL 93 166(H) 88  BUN 6 - 20 mg/dL '12 12 11  '$ Creatinine 0.44 - 1.00 mg/dL 0.54 0.54 0.68  Sodium 135 - 145 mmol/L 135 137 139  Potassium 3.5 -  5.1 mmol/L 4.0 3.3(L) 3.6  Chloride 101 - 111 mmol/L 102 105 100(L)  CO2 22 - 32 mmol/L '26 24 30  '$ Calcium 8.9 - 10.3 mg/dL 9.6 9.2 9.1  Total Protein 6.5 - 8.1 g/dL 7.1 7.1 7.2  Total Bilirubin 0.3 - 1.2 mg/dL 0.6 0.5 0.3  Alkaline Phos 38 - 126 U/L 91 86 143(H)  AST 15 - 41 U/L 30 25 46(H)  ALT 14 - 54 U/L 41 32  60(H)    PENDING LABS:    DIAGNOSTIC IMAGING:  *The following radiologic images and reports have been reviewed independently and agree with below findings.  CT chest/abd/pelvis: 05/15/17 CLINICAL DATA:  New diagnosis breast cancer.  EXAM: CT CHEST, ABDOMEN, AND PELVIS WITH CONTRAST  TECHNIQUE: Multidetector CT imaging of the chest, abdomen and pelvis was performed following the standard protocol during bolus administration of intravenous contrast.  CONTRAST:  139m ISOVUE-300 IOPAMIDOL (ISOVUE-300) INJECTION 61%  COMPARISON:  None.  FINDINGS: CT CHEST FINDINGS  Cardiovascular: The heart is normal in size. No pericardial effusion. Small amount of pericardial fluid. The aorta is normal in caliber. No dissection. No atherosclerotic calcifications. The branch vessels are normal.  Mediastinum/Nodes: No mediastinal or hilar mass or lymphadenopathy. Calcified right paratracheal lymph nodes are noted. The esophagus is grossly normal.  Lungs/Pleura: 5 mm pulmonary nodule in the right lower lobe on image number are 94. Subpleural 5 mm pulmonary nodule in the right lower lobe on image number 87. These nodules are indeterminate and will require surveillance. Small calcified granuloma in the right lower lobe on image number 125. These nodules are indeterminate.  Chest wall/ Musculoskeletal: There are multiple right breast masses 10 and 18 mm nodules are noted in the upper right breast. There is a 2.2 x 1.8 cm mass in the upper right lateral breast and 3 masses in the lower right breast the 2 largest measures 2.4 x 1.8 cm and 3.0 x 2.3 cm. Findings suspicious for chest wall tumor along the inferior and lateral aspect of the breast.  There are enlarged right axillary and subpectoral lymph nodes consistent with local metastatic disease. 10 mm lymph node on image 17. 12.5 mm subpectoral node on image 19. 14 mm axillary lymph node on image number 23.  The left breast  appears grossly normal. No left axillary lymph nodes are identified. No supraclavicular lymphadenopathy. No internal mammary lymphadenopathy.  No worrisome lytic or sclerotic bone lesions to suggest metastatic disease.  CT ABDOMEN PELVIS FINDINGS  Hepatobiliary: No focal hepatic lesions to suggest hepatic metastatic disease. The gallbladder is normal. No common bile duct dilatation.  Pancreas: No mass, inflammation or ductal dilatation.  Spleen: Normal size.  No lesions.  Adrenals/Urinary Tract: The adrenal glands, kidneys and bladder are unremarkable.  Stomach/Bowel: The stomach, duodenum, small bowel and colon are grossly normal with oral contrast. No inflammatory changes, mass lesions or obstructive findings. The appendix is normal.  Vascular/Lymphatic: The aorta is normal in caliber. No dissection. The branch vessels are patent. The major venous structures are patent. No mesenteric or retroperitoneal mass or adenopathy. Small scattered lymph nodes are noted.  Reproductive: The uterus and ovaries are unremarkable.  Other: No pelvic mass or adenopathy. No free pelvic fluid collections. No inguinal mass or adenopathy. No abdominal wall hernia or subcutaneous lesions.  Musculoskeletal: No worrisome bone lesions to suggest metastatic disease.  IMPRESSION: 1. Numerous right breast masses and metastatic right axillary lymphadenopathy. Findings suspicious for chest wall invasion along the lower lateral aspect of the breast. 2.  Two small indeterminate right lower lobe pulmonary nodules will require surveillance. 3. No findings for metastatic disease involving the abdomen/pelvis or osseous structures.   Electronically Signed   By: Marijo Sanes M.D.   On: 05/18/2017 08:42   Bone scan: 05/15/17 CLINICAL DATA:  Recent diagnosis of breast cancer. Left lower extremity prior surgery 03/03/2017.  EXAM: NUCLEAR MEDICINE WHOLE BODY BONE  SCAN  TECHNIQUE: Whole body anterior and posterior images were obtained approximately 3 hours after intravenous injection of radiopharmaceutical.  RADIOPHARMACEUTICALS:  MCi Technetium-77mMDP IV  COMPARISON:  Left lower extremity 17 2018.  FINDINGS: Bilateral renal function excretion. Increased activity noted over the left tibia primarily distally consistent with recent fracture and surgical plate and screw fixation. Mild increase in activity noted over the proximal and mid tibial most likely from prior trauma/surgery. No other significant abnormal bony increased activity noted.  IMPRESSION: No evidence of metastatic disease.   Electronically Signed   By: TMarcello Moores Register   On: 05/15/2017 15:10     PATHOLOGY:  (R) breast biopsy x 2 and axillary LN biopsy: 05/12/17               ASSESSMENT & PLAN:   Stage IIIB (([id number] invasive ductal carcinoma of (R) breast; ER+/PR+/HER2+:  -Diagnosed in 04/2017. Staging imaging with CT chest/abd/pelvis and bone scan were negative for distant metastatic or osseous metastatic disease.  Currently undergoing neoadjuvant chemo with Taxotere/Carbo/Herceptin/Perjeta.  -Due for cycle #3 TCHP today; labs reviewed and are adequate for treatment.  -Next ECHO due in 07/2017; orders placed today.  -Will obtain MRI breast with/without contrast prior to her next cycle to evaluate treatment response thus far.  Clinically, she feels that the masses and wound are much improved.  She is really desiring breast conservation surgery, if able. She would like Dr. BConstance Hawto perform her breast surgery (she has already met Dr. BConstance Hawin consultation several weeks ago).  We will refer her back to Dr. BConstance Hawat the completion of chemo.  Depending on treatment response and surgery type, she may require adjuvant radiation therapy. She understands that she will continue anti-HER2 therapy with Herceptin +/- Perjeta for a total of 1 year of therapy (17  treatments total).  She will also require anti-estrogen therapy as well.  We will help guide her through each phase of her treatment.   -Return to cancer center in 3 weeks for follow-up and cycle #4 TCHP.   Bone pain post-Neulasta injection:  -Provided reassurance that bone pain s/p G-CSF injections are very common. Explained the mechanism of action of this medication.  -Recommended she take OTC Claritin once daily either beginning the day of or day after chemo for ~1 week after chemo. This will help reduce her associated bone pain.  If Claritin is ineffective, then we can give her opiate pain medication (she has previously taken Percocet after leg fracture with good tolerance, with the exception of constipation).    Discomfort at port-a-cath site:  -Again provided reassurance that this is likely her continued adjustment to port-a-cath placement. Per nursing, no difficulty with accessing port, obtaining blood return; port flushes with ease and patient denies pain with flush or administration of medication.  Shared with her that I suspect she is having a bit more discomfort because of her thin frame and the catheter being near the clavicle. Encouraged her to continue to monitor the site and if she experiences any signs/symptoms if infection, then she should let uKoreaknow immediately.   Chemo teaching/support:  -  Provided additional support and education for her today in terms of infection prevention precautions during chemotherapy. Also encouraged her to maintain her physical activity as tolerated, as patients who remain active generally do better in terms of tolerating treatment and subsequent treatments (including surgery and radiation).  Remaining active also improves treatment-related fatigue as well.  We will continue to provide support as needed. Encouraged her to contact us when needed with any additional questions/concerns. She voiced understanding and appreciation.       Dispo:  -MRI breasts  with/without contrast in ~2-3 weeks prior to next cycle of therapy.  -ECHO due in 07/2017; orders placed today.  -Return to cancer center in 3 weeks to review MRI results and subsequent treatment.    All questions were answered to patient's stated satisfaction. Encouraged patient to call with any new concerns or questions before her next visit to the cancer center and we can certain see her sooner, if needed.    Plan of care discussed with Dr. Talbert Cage, who agrees with the above aforementioned.    A total of 30 minutes was spent in face-to-face care of this patient, with greater than 50% of that time spent in counseling and care-coordination.    Orders placed this encounter:  Orders Placed This Encounter  Procedures  . MR BREAST BILATERAL W WO CONTRAST  . ECHOCARDIOGRAM COMPLETE      Mike Craze, NP [place] [phone]

## 2017-07-08 ENCOUNTER — Encounter (HOSPITAL_COMMUNITY): Payer: Self-pay | Admitting: Adult Health

## 2017-07-08 ENCOUNTER — Other Ambulatory Visit: Payer: Self-pay

## 2017-07-08 ENCOUNTER — Encounter (HOSPITAL_BASED_OUTPATIENT_CLINIC_OR_DEPARTMENT_OTHER): Payer: Self-pay | Admitting: Adult Health

## 2017-07-08 ENCOUNTER — Encounter (HOSPITAL_COMMUNITY): Payer: Self-pay | Attending: Oncology

## 2017-07-08 VITALS — BP 112/61 | HR 73 | Temp 98.4°F | Resp 18 | Wt 123.6 lb

## 2017-07-08 DIAGNOSIS — C773 Secondary and unspecified malignant neoplasm of axilla and upper limb lymph nodes: Secondary | ICD-10-CM

## 2017-07-08 DIAGNOSIS — C50911 Malignant neoplasm of unspecified site of right female breast: Secondary | ICD-10-CM

## 2017-07-08 DIAGNOSIS — Z170 Estrogen receptor positive status [ER+]: Secondary | ICD-10-CM

## 2017-07-08 DIAGNOSIS — Z5111 Encounter for antineoplastic chemotherapy: Secondary | ICD-10-CM

## 2017-07-08 DIAGNOSIS — C50811 Malignant neoplasm of overlapping sites of right female breast: Secondary | ICD-10-CM | POA: Insufficient documentation

## 2017-07-08 DIAGNOSIS — Z5112 Encounter for antineoplastic immunotherapy: Secondary | ICD-10-CM

## 2017-07-08 DIAGNOSIS — Z5189 Encounter for other specified aftercare: Secondary | ICD-10-CM

## 2017-07-08 LAB — COMPREHENSIVE METABOLIC PANEL
ALT: 41 U/L (ref 14–54)
ANION GAP: 7 (ref 5–15)
AST: 30 U/L (ref 15–41)
Albumin: 4 g/dL (ref 3.5–5.0)
Alkaline Phosphatase: 91 U/L (ref 38–126)
BUN: 12 mg/dL (ref 6–20)
CHLORIDE: 102 mmol/L (ref 101–111)
CO2: 26 mmol/L (ref 22–32)
CREATININE: 0.54 mg/dL (ref 0.44–1.00)
Calcium: 9.6 mg/dL (ref 8.9–10.3)
Glucose, Bld: 93 mg/dL (ref 65–99)
Potassium: 4 mmol/L (ref 3.5–5.1)
SODIUM: 135 mmol/L (ref 135–145)
Total Bilirubin: 0.6 mg/dL (ref 0.3–1.2)
Total Protein: 7.1 g/dL (ref 6.5–8.1)

## 2017-07-08 LAB — CBC WITH DIFFERENTIAL/PLATELET
BASOS ABS: 0 10*3/uL (ref 0.0–0.1)
BASOS PCT: 0 %
Eosinophils Absolute: 0 10*3/uL (ref 0.0–0.7)
Eosinophils Relative: 0 %
HEMATOCRIT: 36.9 % (ref 36.0–46.0)
HEMOGLOBIN: 12.7 g/dL (ref 12.0–15.0)
Lymphocytes Relative: 10 %
Lymphs Abs: 1.2 10*3/uL (ref 0.7–4.0)
MCH: 33.2 pg (ref 26.0–34.0)
MCHC: 34.4 g/dL (ref 30.0–36.0)
MCV: 96.6 fL (ref 78.0–100.0)
Monocytes Absolute: 0.8 10*3/uL (ref 0.1–1.0)
Monocytes Relative: 7 %
NEUTROS ABS: 10.7 10*3/uL — AB (ref 1.7–7.7)
NEUTROS PCT: 83 %
PLATELETS: 349 10*3/uL (ref 150–400)
RBC: 3.82 MIL/uL — ABNORMAL LOW (ref 3.87–5.11)
RDW: 15.3 % (ref 11.5–15.5)
WBC: 12.7 10*3/uL — AB (ref 4.0–10.5)

## 2017-07-08 MED ORDER — DIPHENHYDRAMINE HCL 25 MG PO CAPS: 50.0000 mg | ORAL_CAPSULE | Freq: Once | ORAL | Status: AC

## 2017-07-08 MED ORDER — DIPHENHYDRAMINE HCL 25 MG PO CAPS
ORAL_CAPSULE | ORAL | Status: AC
  Filled 2017-07-08: qty 2

## 2017-07-08 MED ORDER — SODIUM CHLORIDE 0.9 % IV SOLN
Freq: Once | INTRAVENOUS | Status: AC
  Administered 2017-07-08: 11:00:00 via INTRAVENOUS

## 2017-07-08 MED ORDER — DIPHENHYDRAMINE HCL 25 MG PO CAPS
50.0000 mg | ORAL_CAPSULE | Freq: Once | ORAL | Status: AC
  Administered 2017-07-08: 50 mg via ORAL

## 2017-07-08 MED ORDER — PALONOSETRON HCL INJECTION 0.25 MG/5ML
0.2500 mg | Freq: Once | INTRAVENOUS | Status: AC
  Administered 2017-07-08: 0.25 mg via INTRAVENOUS
  Filled 2017-07-08: qty 5

## 2017-07-08 MED ORDER — PEGFILGRASTIM 6 MG/0.6ML ~~LOC~~ PSKT
6.0000 mg | PREFILLED_SYRINGE | Freq: Once | SUBCUTANEOUS | Status: AC
  Administered 2017-07-08: 6 mg via SUBCUTANEOUS
  Filled 2017-07-08: qty 0.6

## 2017-07-08 MED ORDER — ACETAMINOPHEN 325 MG PO TABS: 650.0000 mg | ORAL_TABLET | Freq: Once | ORAL | Status: AC

## 2017-07-08 MED ORDER — DOCETAXEL CHEMO INJECTION 160 MG/16ML
75.0000 mg/m2 | Freq: Once | INTRAVENOUS | Status: AC
  Administered 2017-07-08: 120 mg via INTRAVENOUS
  Filled 2017-07-08: qty 12

## 2017-07-08 MED ORDER — SODIUM CHLORIDE 0.9 % IV SOLN
612.6000 mg | Freq: Once | INTRAVENOUS | Status: AC
  Administered 2017-07-08: 610 mg via INTRAVENOUS
  Filled 2017-07-08: qty 61

## 2017-07-08 MED ORDER — ACETAMINOPHEN 325 MG PO TABS
650.0000 mg | ORAL_TABLET | Freq: Once | ORAL | Status: AC
  Administered 2017-07-08: 650 mg via ORAL

## 2017-07-08 MED ORDER — SODIUM CHLORIDE 0.9 % IV SOLN: 10.0000 mg | Freq: Once | INTRAVENOUS | Status: DC

## 2017-07-08 MED ORDER — SODIUM CHLORIDE 0.9 % IV SOLN
420.0000 mg | Freq: Once | INTRAVENOUS | Status: AC
  Administered 2017-07-08: 420 mg via INTRAVENOUS
  Filled 2017-07-08: qty 14

## 2017-07-08 MED ORDER — HEPARIN SOD (PORK) LOCK FLUSH 100 UNIT/ML IV SOLN
500.0000 [IU] | Freq: Once | INTRAVENOUS | Status: AC | PRN
  Administered 2017-07-08: 500 [IU]
  Filled 2017-07-08: qty 5

## 2017-07-08 MED ORDER — ACETAMINOPHEN 325 MG PO TABS
ORAL_TABLET | ORAL | Status: AC
  Filled 2017-07-08: qty 2

## 2017-07-08 MED ORDER — DEXAMETHASONE SODIUM PHOSPHATE 10 MG/ML IJ SOLN
10.0000 mg | Freq: Once | INTRAMUSCULAR | Status: AC
  Administered 2017-07-08: 10 mg via INTRAVENOUS
  Filled 2017-07-08: qty 1

## 2017-07-08 MED ORDER — SODIUM CHLORIDE 0.9 % IV SOLN
300.0000 mg | Freq: Once | INTRAVENOUS | Status: AC
  Administered 2017-07-08: 300 mg via INTRAVENOUS
  Filled 2017-07-08: qty 14.29

## 2017-07-08 MED ORDER — SODIUM CHLORIDE 0.9% FLUSH
10.0000 mL | INTRAVENOUS | Status: DC | PRN
  Administered 2017-07-08: 10 mL
  Filled 2017-07-08: qty 10

## 2017-07-08 NOTE — Progress Notes (Signed)
Labs reviewed with MD. Proceed with Treatment.     Angel Kitchen.Serrena Hale arrived today for [facility] neulasta on body injector. See MAR for administration details. Injector in place and engaged with green light indicator on flashing. Tolerated application with out problems.   Treatment given per orders. Patient tolerated it well without problems. Vitals stable and discharged home from clinic ambulatory. Follow up as scheduled.

## 2017-07-08 NOTE — Patient Instructions (Addendum)
[place] at [facility] Discharge Instructions  RECOMMENDATIONS MADE BY THE CONSULTANT AND ANY TEST RESULTS WILL BE SENT TO YOUR REFERRING PHYSICIAN.  You were seen today by Mike Craze, NP We will get your MRI of the breast within the next 3 weeks Continue treatments every 3 weeks and we will follow up with you at next visit for results See schedulers up front for appointments   Thank you for choosing [place] at [facility] to provide your oncology and hematology care.  To afford each patient quality time with our provider, please arrive at least 15 minutes before your scheduled appointment time.    If you have a lab appointment with the [facility] please come in thru the  Main Entrance and check in at the main information desk  You need to re-schedule your appointment should you arrive 10 or more minutes late.  We strive to give you quality time with our providers, and arriving late affects you and other patients whose appointments are after yours.  Also, if you no show three or more times for appointments you may be dismissed from the clinic at the providers discretion.     Again, thank you for choosing [facility].  Our hope is that these requests will decrease the amount of time that you wait before being seen by our physicians.       _____________________________________________________________  Should you have questions after your visit to [facility], please contact our office at [phone] between the hours of 8:30 a.m. and 4:30 p.m.  Voicemails left after 4:30 p.m. will not be returned until the following business day.  For prescription refill requests, have your pharmacy contact our office.       Resources For Cancer Patients and their Caregivers ? American Cancer Society: Can assist with transportation, wigs, general needs, runs Look Good Feel Better.        314 625 2677 ? Cancer  Care: Provides financial assistance, online support groups, medication/co-pay assistance.  1-800-813-HOPE 820-250-8461) ? [facility] Assists [place] cancer patients and their families through emotional , educational and financial support.  270-839-8519 ? [place] DSS Where to apply for food stamps, Medicaid and utility assistance. (501) 270-2820 ? [employer]: Transportation to medical appointments. 4095010778 ? Social Security Administration: May apply for disability if have a Stage IV cancer. 9802481587 408-230-7385 ? [employer], Disability and Transit Services: Assists with nutrition, care and transit needs. [facility] Support Programs: @10RELATIVEDAYS @ > Cancer Support Group  2nd Tuesday of the month 1pm-2pm, Journey Room  > Creative Journey  3rd Tuesday of the month 1130am-1pm, Journey Room  > Look Good Feel Better  1st Wednesday of the month 10am-12 noon, Journey Room (Call [place] to register [phone])

## 2017-07-08 NOTE — Patient Instructions (Signed)
[facility] Discharge Instructions for Patients Receiving Chemotherapy   Beginning January 23rd 2017 lab work for the Cancer Center will be done in the  Main lab at [facility] on 1st floor. If you have a lab appointment with the Cancer Center please come in thru the  Main Entrance and check in at the main information desk   Today you received the following chemotherapy agents   To help prevent nausea and vomiting after your treatment, we encourage you to take your nausea medication     If you develop nausea and vomiting, or diarrhea that is not controlled by your medication, call the clinic.  The clinic phone number is [phone]. Office hours are Monday-Friday 8:30am-5:00pm.  BELOW ARE SYMPTOMS THAT SHOULD BE REPORTED IMMEDIATELY:  *FEVER GREATER THAN 101.0 F  *CHILLS WITH OR WITHOUT FEVER  NAUSEA AND VOMITING THAT IS NOT CONTROLLED WITH YOUR NAUSEA MEDICATION  *UNUSUAL SHORTNESS OF BREATH  *UNUSUAL BRUISING OR BLEEDING  TENDERNESS IN MOUTH AND THROAT WITH OR WITHOUT PRESENCE OF ULCERS  *URINARY PROBLEMS  *BOWEL PROBLEMS  UNUSUAL RASH Items with * indicate a potential emergency and should be followed up as soon as possible. If you have an emergency after office hours please contact your primary care physician or go to the nearest emergency department.  Please call the clinic during office hours if you have any questions or concerns.   You may also contact the Patient Navigator at [phone] should you have any questions or need assistance in obtaining follow up care.      Resources For Cancer Patients and their Caregivers ? American Cancer Society: Can assist with transportation, wigs, general needs, runs Look Good Feel Better.        1-888-227-6333 ? Cancer Care: Provides financial assistance, online support groups, medication/co-pay assistance.  1-800-813-HOPE (4673) ? [facility] Assists [place] cancer  patients and their families through emotional , educational and financial support.  336-427-4357 ? [place] DSS Where to apply for food stamps, Medicaid and utility assistance. 336-342-1394 ? [employer]: Transportation to medical appointments. 336-347-2287 ? Social Security Administration: May apply for disability if have a Stage IV cancer. 336-342-7796 1-800-772-1213 ? [place] [employer], Disability and Transit Services: Assists with nutrition, care and transit needs. 336-349-2343         

## 2017-07-09 LAB — CANCER ANTIGEN 15-3: CA 15-3: 33 U/mL — ABNORMAL HIGH (ref 0.0–25.0)

## 2017-07-09 LAB — CANCER ANTIGEN 27.29: CAN 27.29: 40.4 U/mL — AB (ref 0.0–38.6)

## 2017-07-13 ENCOUNTER — Telehealth (HOSPITAL_COMMUNITY): Payer: Self-pay

## 2017-07-13 NOTE — Telephone Encounter (Signed)
0900-patient called this morning to let MD know about an episode she had during the night. Patient stated that she had a bad day yesterday, more fatigued, emotional than usual. Patient stated she did take her claritan Saturday and Sunday to help with the joint pain. Patient stated she went to bed with a headache about 8pm last night, had gotten up to go to the bathroom around 4am, sat down on the toilet and passed out, fell off toilet and hit her nose and face. She stated she did have some bleeding from the cut on her nose. She did have some chills yesterday but denies fever. Patient stated she was not trying to have a bowel movement at this time just urinating. She has a good appetite, ate breakfast this morning and is drinking water a lot. Patient denies SOB, light headache at this time but feels a little better this time. Will notify MD

## 2017-07-13 NOTE — Telephone Encounter (Signed)
1000-called patient back to offer her to come into have hydration fluids. Patient stated she is feeling better, that maybe she would just stay home and hydrate. Patient is going to talk to husband and let me know later if she decides to come in for fluids or not. MD aware.

## 2017-07-21 ENCOUNTER — Ambulatory Visit (HOSPITAL_COMMUNITY)
Admission: RE | Admit: 2017-07-21 | Discharge: 2017-07-21 | Disposition: A | Discharge status: Home / Self Care | Payer: Self-pay | Source: Ambulatory Visit | Attending: Adult Health | Admitting: Adult Health

## 2017-07-21 DIAGNOSIS — R234 Changes in skin texture: Secondary | ICD-10-CM | POA: Insufficient documentation

## 2017-07-21 DIAGNOSIS — C773 Secondary and unspecified malignant neoplasm of axilla and upper limb lymph nodes: Secondary | ICD-10-CM | POA: Insufficient documentation

## 2017-07-21 DIAGNOSIS — Z9221 Personal history of antineoplastic chemotherapy: Secondary | ICD-10-CM | POA: Insufficient documentation

## 2017-07-21 DIAGNOSIS — Z170 Estrogen receptor positive status [ER+]: Secondary | ICD-10-CM | POA: Insufficient documentation

## 2017-07-21 DIAGNOSIS — C50811 Malignant neoplasm of overlapping sites of right female breast: Secondary | ICD-10-CM | POA: Insufficient documentation

## 2017-07-21 DIAGNOSIS — N6459 Other signs and symptoms in breast: Secondary | ICD-10-CM | POA: Insufficient documentation

## 2017-07-21 MED ORDER — GADOBENATE DIMEGLUMINE 529 MG/ML IV SOLN
15.0000 mL | Freq: Once | INTRAVENOUS | Status: AC | PRN
  Administered 2017-07-21: 11 mL via INTRAVENOUS

## 2017-07-29 ENCOUNTER — Encounter (HOSPITAL_COMMUNITY): Payer: Self-pay | Admitting: Oncology

## 2017-07-29 ENCOUNTER — Encounter (HOSPITAL_BASED_OUTPATIENT_CLINIC_OR_DEPARTMENT_OTHER): Payer: Self-pay | Admitting: Oncology

## 2017-07-29 ENCOUNTER — Encounter (HOSPITAL_COMMUNITY): Payer: Self-pay | Attending: Oncology

## 2017-07-29 ENCOUNTER — Other Ambulatory Visit: Payer: Self-pay

## 2017-07-29 VITALS — BP 104/68 | HR 73 | Temp 98.5°F | Resp 18 | Wt 127.8 lb

## 2017-07-29 DIAGNOSIS — C773 Secondary and unspecified malignant neoplasm of axilla and upper limb lymph nodes: Secondary | ICD-10-CM

## 2017-07-29 DIAGNOSIS — C50811 Malignant neoplasm of overlapping sites of right female breast: Secondary | ICD-10-CM

## 2017-07-29 DIAGNOSIS — R55 Syncope and collapse: Secondary | ICD-10-CM

## 2017-07-29 DIAGNOSIS — C50911 Malignant neoplasm of unspecified site of right female breast: Secondary | ICD-10-CM | POA: Insufficient documentation

## 2017-07-29 DIAGNOSIS — Z5112 Encounter for antineoplastic immunotherapy: Secondary | ICD-10-CM

## 2017-07-29 DIAGNOSIS — Z170 Estrogen receptor positive status [ER+]: Secondary | ICD-10-CM

## 2017-07-29 DIAGNOSIS — Z5111 Encounter for antineoplastic chemotherapy: Secondary | ICD-10-CM

## 2017-07-29 DIAGNOSIS — Z5189 Encounter for other specified aftercare: Secondary | ICD-10-CM

## 2017-07-29 LAB — CBC WITH DIFFERENTIAL/PLATELET
BASOS ABS: 0 10*3/uL (ref 0.0–0.1)
Basophils Relative: 0 %
EOS PCT: 0 %
Eosinophils Absolute: 0 10*3/uL (ref 0.0–0.7)
HEMATOCRIT: 34.5 % — AB (ref 36.0–46.0)
Hemoglobin: 11.7 g/dL — ABNORMAL LOW (ref 12.0–15.0)
LYMPHS PCT: 14 %
Lymphs Abs: 1.5 10*3/uL (ref 0.7–4.0)
MCH: 33.1 pg (ref 26.0–34.0)
MCHC: 33.9 g/dL (ref 30.0–36.0)
MCV: 97.5 fL (ref 78.0–100.0)
MONO ABS: 1 10*3/uL (ref 0.1–1.0)
MONOS PCT: 10 %
Neutro Abs: 8.3 10*3/uL — ABNORMAL HIGH (ref 1.7–7.7)
Neutrophils Relative %: 76 %
PLATELETS: 307 10*3/uL (ref 150–400)
RBC: 3.54 MIL/uL — ABNORMAL LOW (ref 3.87–5.11)
RDW: 15.9 % — ABNORMAL HIGH (ref 11.5–15.5)
WBC: 10.9 10*3/uL — ABNORMAL HIGH (ref 4.0–10.5)

## 2017-07-29 LAB — COMPREHENSIVE METABOLIC PANEL
ALBUMIN: 4 g/dL (ref 3.5–5.0)
ALT: 30 U/L (ref 14–54)
AST: 25 U/L (ref 15–41)
Alkaline Phosphatase: 83 U/L (ref 38–126)
Anion gap: 8 (ref 5–15)
BILIRUBIN TOTAL: 0.5 mg/dL (ref 0.3–1.2)
BUN: 11 mg/dL (ref 6–20)
CO2: 26 mmol/L (ref 22–32)
Calcium: 9.6 mg/dL (ref 8.9–10.3)
Chloride: 103 mmol/L (ref 101–111)
Creatinine, Ser: 0.57 mg/dL (ref 0.44–1.00)
GFR calc Af Amer: >60 mL/min (ref >60)
GFR calc non Af Amer: >60 mL/min (ref >60)
GLUCOSE: 93 mg/dL (ref 65–99)
POTASSIUM: 3.7 mmol/L (ref 3.5–5.1)
SODIUM: 137 mmol/L (ref 135–145)
TOTAL PROTEIN: 7.2 g/dL (ref 6.5–8.1)

## 2017-07-29 MED ORDER — TRASTUZUMAB CHEMO 150 MG IV SOLR
350.0000 mg | Freq: Once | INTRAVENOUS | Status: AC
  Administered 2017-07-29: 350 mg via INTRAVENOUS
  Filled 2017-07-29: qty 16.67

## 2017-07-29 MED ORDER — HEPARIN SOD (PORK) LOCK FLUSH 100 UNIT/ML IV SOLN
500.0000 [IU] | Freq: Once | INTRAVENOUS | Status: AC | PRN
  Administered 2017-07-29: 500 [IU]
  Filled 2017-07-29 (×2): qty 5

## 2017-07-29 MED ORDER — PEGFILGRASTIM 6 MG/0.6ML ~~LOC~~ PSKT
6.0000 mg | PREFILLED_SYRINGE | Freq: Once | SUBCUTANEOUS | Status: AC
  Administered 2017-07-29: 6 mg via SUBCUTANEOUS
  Filled 2017-07-29: qty 0.6

## 2017-07-29 MED ORDER — DEXTROSE 5 % IV SOLN
75.0000 mg/m2 | Freq: Once | INTRAVENOUS | Status: AC
  Administered 2017-07-29: 120 mg via INTRAVENOUS
  Filled 2017-07-29: qty 12

## 2017-07-29 MED ORDER — SODIUM CHLORIDE 0.9 % IV SOLN
Freq: Once | INTRAVENOUS | Status: AC
  Administered 2017-07-29: 11:00:00 via INTRAVENOUS

## 2017-07-29 MED ORDER — DEXAMETHASONE SODIUM PHOSPHATE 10 MG/ML IJ SOLN
10.0000 mg | Freq: Once | INTRAMUSCULAR | Status: AC
  Administered 2017-07-29: 10 mg via INTRAVENOUS
  Filled 2017-07-29: qty 1

## 2017-07-29 MED ORDER — ACETAMINOPHEN 325 MG PO TABS
650.0000 mg | ORAL_TABLET | Freq: Once | ORAL | Status: AC
  Administered 2017-07-29: 650 mg via ORAL
  Filled 2017-07-29: qty 2

## 2017-07-29 MED ORDER — SODIUM CHLORIDE 0.9 % IV SOLN
600.0000 mg | Freq: Once | INTRAVENOUS | Status: AC
  Administered 2017-07-29: 600 mg via INTRAVENOUS
  Filled 2017-07-29: qty 60

## 2017-07-29 MED ORDER — DIPHENHYDRAMINE HCL 25 MG PO CAPS
50.0000 mg | ORAL_CAPSULE | Freq: Once | ORAL | Status: AC
  Administered 2017-07-29: 50 mg via ORAL
  Filled 2017-07-29: qty 2

## 2017-07-29 MED ORDER — PERTUZUMAB CHEMO INJECTION 420 MG/14ML
420.0000 mg | Freq: Once | INTRAVENOUS | Status: AC
  Administered 2017-07-29: 420 mg via INTRAVENOUS
  Filled 2017-07-29: qty 14

## 2017-07-29 MED ORDER — PALONOSETRON HCL INJECTION 0.25 MG/5ML
0.2500 mg | Freq: Once | INTRAVENOUS | Status: AC
  Administered 2017-07-29: 0.25 mg via INTRAVENOUS
  Filled 2017-07-29: qty 5

## 2017-07-29 MED ORDER — SODIUM CHLORIDE 0.9 % IV SOLN: 10.0000 mg | Freq: Once | INTRAVENOUS | Status: DC

## 2017-07-29 NOTE — Progress Notes (Signed)
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 Ki67 80%.        05/12/2017 Procedure    Additional (R) breast biopsy (UOQ): IDC, grade 3; ER+ (80%), PR+ (50%), HER2+ (ratio 2.77), Ki67 90%.       05/12/2017 Procedure    (R) axillary lymph node biopsy: Metastatic carcinoma; ER+ (80%), PR+ (30%), HER2+ (ratio 2.99), Ki67 80%.       05/12/2017 Imaging    Mammogram/breast US: Targeted ultrasound is performed, showing multiple irregular hypoechoic masses. In the 6 o'clock region of the right breast is a large irregular mass spans at least 4.8 cm. In the 4:00 to 5:00 region of the right breast an irregular mass measures 4.5 cm. In the 930-10 o'clock position of the right breast centered 9-10 cm from the nipple is an irregular 4.0 x 3.8 x 1.9 cm hypoechoic mass. In the 9:30 position of the right breast at the lateral margin is a probable abnormal intramammary lymph node measuring 1.3 cm greatest diameter. In the 10 o'clock position of the right breast far lateral is a hypoechoic dermal nodule that measures 0.5 cm. Diffuse skin thickening of the right breast measures up to 4.5 mm.      05/14/2017 Tumor Marker      Ref. Range 05/14/2017 15:30  CA 15-3 Latest Ref Range: 0.0 - 25.0 U/mL 115.9 (H)     Ref. Range 05/14/2017 15:30  CA 27.29 Latest Ref Range: 0.0 - 38.6 U/mL 157.7 (H)        05/15/2017 Imaging    CT chest/abd/pelvis:  IMPRESSION: 1.  Numerous right breast masses and metastatic right axillary lymphadenopathy. Findings suspicious for chest wall invasion along the lower lateral aspect of the breast. 2. Two small indeterminate right lower lobe pulmonary nodules will require surveillance. 3. No findings for metastatic disease involving the abdomen/pelvis or osseous structures.      05/15/2017 Imaging    Bone scan:  IMPRESSION: No evidence of metastatic disease.      05/18/2017 Echocardiogram    LV EF: 60% -   65%      05/20/2017 Procedure    Port-a-cath placement       05/25/2017 Initial Diagnosis    Malignant neoplasm of overlapping sites of right breast in female, estrogen receptor positive ([place])      05/27/2017 -  Neo-Adjuvant Chemotherapy    TCHP         HISTORY OF PRESENT ILLNESS:  (From Dr. Laverle Patter note on 06/17/17)  Angel Hale 46 y.o.premenopausal female presents today for evaluation of high-grade right breast cancer. Patient states that 2 years ago she initially noted a mass in her right breast, however patient thought that it was a cyst and did not get any evaluation done. Her symptoms have worsened over time and she now has an open lesion which is draining in the inferior right breast along the inframammary fold as well as multiple masses in her breast. Patient is uninsured and does  not have a PCP, so she was not receiving regular care.   She underwent a bilateral diagnostic mammogram with ultrasound on 05/12/2017 which demonstrated locally advanced multicentric neglected right breast cancer with a fungating mass along the inframammary fold and diffuse skin thickening. Right axillary and right infraclavicular pathologic lymphadenopathy.   Patient underwent ultrasound-guided biopsy on 05/12/2017 of the right medial lower inner quadrant breast which demonstrated invasive ductal carcinoma grade 3, biopsy of right upper outer quadrant breast demonstrated invasive ductal carcinoma grade 3, biopsy of  right axillary lymph node demonstrated lymph node with metastatic carcinoma.  Staging bone scan 05/15/2017 demonstrated no evidence of metastatic disease.   Staging CT chest/abdomen/pelvis 05/15/17 demonstrated numerous right breast masses and metastatic right axillary lymphadenopathy. Findings suspicious for chest wall invasion along the lower lateral aspect of the breast. Two small indeterminate right lower lobe pulmonary nodules will require surveillance. No findings for metastatic disease involving the abdomen/pelvis or osseous structures.   Tumor markers: CA 15-3 on 05/14/2017 was 115.9. CA-27-29 on 05/14/2017 was 157.7.   She is currently on Bactrim for the open wound on her right breast.  Patient's case is complicated in that she had a horseback riding accident in July and underwent an OPEN REDUCTION INTERNAL FIXATION (ORIF) LEFT PILON FRACTURE AND LEFT LATERAL MALLEOLUS on 03/03/17.  However since then, she's had complications with a screw sticking out of her foot. She is seeing her orthopedic surgeon this upcoming Thursday to see one when she will have surgery to get this fixed.  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   She had removal of her protruding hardware in her left ankle on 05/21/2017. She stated the procedure went well and she is not noted any bleeding.   Patient also had a left chest wall chemotherapy port placed on 05/20/2017.   ECHO on 05/18/17 showed an EF of 60-65%.     INTERVAL HISTORY:  Angel Hale 46 y.o. female returns for routine follow-up for right breast cancer.  She states she has been tolerating her treatments well.  However 3 days after her last treatment, she had a syncopal episode in the bathroom and passed out.  Her husband found her in the bathroom.  She states that since then she has been trying to do better with hydration.  She states that her breast mass is no longer palpable in her breast wound is almost  completely healed.  She denies any weight loss, chest pain, shortness of breath, nausea, vomiting, diarrhea, abdominal pain, focal weakness.  REVIEW OF SYSTEMS:  ROS as per HPI otherwise 12 point ROS is negative.   PAST MEDICAL/SURGICAL HISTORY:  Past Medical History:  Diagnosis Date  . Cancer [facility])    breast cancer   Past Surgical History:  Procedure Laterality Date  . BUNIONECTOMY Left 2000  . EXTERNAL FIXATION LEG Left 02/16/2017   Procedure: EXTERNAL FIXATION LEG;  Surgeon: Mcarthur Rossetti, MD;  Location: [place];  Service: Orthopedics;  Laterality: Left;  . HARDWARE REMOVAL Left 05/21/2017   Procedure: REMOVAL OF RETAINED PROMINENT SCREW LEFT ANKLE;  Surgeon: Mcarthur Rossetti, MD;  Location: [place];  Service: Orthopedics;  Laterality: Left;  . ORIF ANKLE FRACTURE Left 03/03/2017  . ORIF ANKLE FRACTURE Left 03/03/2017   Procedure: OPEN REDUCTION INTERNAL FIXATION (ORIF) LEFT PILON FRACTURE AND LEFT LATERAL MALLEOLUS;  Surgeon: Mcarthur Rossetti, MD;  Location: [place];  Service: Orthopedics;  Laterality: Left;  . OVARIAN CYST REMOVAL Left 1991  .  PORTACATH PLACEMENT    . PORTACATH PLACEMENT Left 05/20/2017   Procedure: INSERTION PORT-A-CATH;  Surgeon: Virl Cagey, MD;  Location: [facility];  Service: General;  Laterality: Left;  . WISDOM TOOTH EXTRACTION       SOCIAL HISTORY:  Social History   Socioeconomic History  . Marital status: Married    Spouse name: Not on file  . Number of children: Not on file  . Years of education: Not on file  . Highest education level: Not on file  Social Needs  . Financial resource strain: Not on file  . Food insecurity - worry: Not on file  . Food insecurity - inability: Not on file  . Transportation needs - medical: Not on file  . Transportation needs - non-medical: Not on file  Occupational History  . Not on file  Tobacco Use  . Smoking status: Former Smoker    Packs/day: 1.00    Years: 10.00    Pack years: 10.00      Types: Cigarettes    Last attempt to quit: 02/16/2017    Years since quitting: 0.4  . Smokeless tobacco: Never Used  Substance and Sexual Activity  . Alcohol use: Yes    Comment: occasional  . Drug use: No  . Sexual activity: Not Currently    Birth control/protection: None  Other Topics Concern  . Not on file  Social History Narrative  . Not on file    FAMILY HISTORY:  Family History  Problem Relation Age of Onset  . Colon cancer Father     CURRENT MEDICATIONS:  Outpatient Encounter Medications as of 07/29/2017  Medication Sig  . acetaminophen (TYLENOL) 500 MG tablet Take 1,000 mg by mouth every 6 (six) hours as needed (for pain.).  Marland Kitchen CARBOPLATIN IV Inject into the vein. Every 3 weeks  . Coenzyme Q10 (CO Q-10 PO) Take 1 tablet by mouth daily.  . Cyanocobalamin (B-12 PO) Take 1 tablet by mouth daily.  Marland Kitchen dexamethasone (DECADRON) 4 MG tablet Take 2 tablets (8 mg total) by mouth 2 (two) times daily. Start the day before Taxotere. Then again the day after chemo for 3 days.  . DOCEtaxel (TAXOTERE IV) Inject into the vein. Every 3 weeks  . Homeopathic Products (FRANKINCENSE UPLIFTING) OIL Take 1 capsule by mouth daily as needed (for home remedy).  Marland Kitchen HYDROcodone-acetaminophen (NORCO) 5-325 MG tablet Take 1 tablet by mouth every 4 (four) hours as needed for moderate pain.  Marland Kitchen LAVENDER OIL PO Take 1 capsule by mouth daily as needed (for home remedy).  Marland Kitchen lidocaine-prilocaine (EMLA) cream Apply to affected area once  . LORazepam (ATIVAN) 0.5 MG tablet Take 1 tablet (0.5 mg total) by mouth every 6 (six) hours as needed (Nausea or vomiting).  . methocarbamol (ROBAXIN) 500 MG tablet Take 1 tablet (500 mg total) by mouth every 6 (six) hours as needed for muscle spasms.  . Multiple Vitamins-Minerals (ZINC PO) Take 1 capsule by mouth daily.  . ondansetron (ZOFRAN) 8 MG tablet Take 1 tablet (8 mg total) by mouth 2 (two) times daily as needed for refractory nausea / vomiting. Start on day 3 after  chemo.  Marland Kitchen Pegfilgrastim (NEULASTA ONPRO [place]) Inject into the skin. Every 3 weeks  . Pertuzumab (PERJETA IV) Inject into the vein. Every 3 weeks  . prochlorperazine (COMPAZINE) 10 MG tablet Take 1 tablet (10 mg total) by mouth every 6 (six) hours as needed (Nausea or vomiting).  . Trastuzumab (HERCEPTIN IV) Inject into the vein. Every 3 weeks  .  vitamin C (ASCORBIC ACID) 500 MG tablet Take 1 tablet (500 mg total) by mouth daily.   No facility-administered encounter medications on file as of 07/29/2017.     ALLERGIES:  Allergies  Allergen Reactions  . Penicillins Nausea And Vomiting    Has patient had a PCN reaction causing immediate rash, facial/tongue/throat swelling, SOB or lightheadedness with hypotension: No Has patient had a PCN reaction causing severe rash involving mucus membranes or skin necrosis: No Has patient had a PCN reaction that required hospitalization: No Has patient had a PCN reaction occurring within the last 10 years: No If all of the above answers are "NO", then may proceed with Cephalosporin use.      PHYSICAL EXAM:  ECOG Performance status: 0-1 - Mild symptoms; remains independent   RN vitals reviewed.   Physical Exam  Constitutional: She is oriented to person, place, and time and well-developed, well-nourished, and in no distress.  HENT:  Head: Normocephalic.  Mouth/Throat: Oropharynx is clear and moist.  Eyes: Conjunctivae are normal. No scleral icterus.  Neck: Normal range of motion. Neck supple.  Cardiovascular: Normal rate and regular rhythm.  Pulmonary/Chest: Effort normal and breath sounds normal. No respiratory distress.  Abdominal: Soft. Bowel sounds are normal. There is no tenderness. There is no rebound.  Musculoskeletal: Normal range of motion. She exhibits no edema.  Lymphadenopathy:    She has no cervical adenopathy.       Right: No supraclavicular adenopathy present.       Left: No supraclavicular adenopathy present.  Neurological: She is  alert and oriented to person, place, and time. No cranial nerve deficit.  Skin: Skin is warm and dry. No rash noted.  Psychiatric: Mood, memory, affect and judgment normal.  Nursing note and vitals reviewed. Breast exam: right breast is without any palpable masses, nipple discharge or axillary lymphadenopathy. Small 3 cm area of erythema with a 0.8 cm scab on the 6 o'clock position of the breast; her open wound has improved tremendously.   LABORATORY DATA:  I have reviewed the labs as listed.  CBC    Component Value Date/Time   WBC 12.7 (H) 07/08/2017 0949   RBC 3.82 (L) 07/08/2017 0949   HGB 12.7 07/08/2017 0949   HCT 36.9 07/08/2017 0949   PLT 349 07/08/2017 0949   MCV 96.6 07/08/2017 0949   MCH 33.2 07/08/2017 0949   MCHC 34.4 07/08/2017 0949   RDW 15.3 07/08/2017 0949   LYMPHSABS 1.2 07/08/2017 0949   MONOABS 0.8 07/08/2017 0949   EOSABS 0.0 07/08/2017 0949   BASOSABS 0.0 07/08/2017 0949   CMP Latest Ref Rng & Units 07/08/2017 06/17/2017 06/05/2017  Glucose 65 - 99 mg/dL 93 166(H) 88  BUN 6 - 20 mg/dL _0 Creatinine 0.44 - 1.00 mg/dL 0.54 0.54 0.68  Sodium 135 - 145 mmol/L 135 137 139  Potassium 3.5 - 5.1 mmol/L 4.0 3.3(L) 3.6  Chloride 101 - 111 mmol/L 102 105 100(L)  CO2 22 - 32 mmol/L _1 Calcium 8.9 - 10.3 mg/dL 9.6 9.2 9.1  Total Protein 6.5 - 8.1 g/dL 7.1 7.1 7.2  Total Bilirubin 0.3 - 1.2 mg/dL 0.6 0.5 0.3  Alkaline Phos 38 - 126 U/L 91 86 143(H)  AST 15 - 41 U/L 30 25 46(H)  ALT 14 - 54 U/L 41 32 60(H)    PENDING LABS:    DIAGNOSTIC IMAGING:  *The following radiologic images and reports have been reviewed independently and agree with below findings.  CT chest/abd/pelvis: 05/15/17 CLINICAL DATA:  New diagnosis breast cancer.  EXAM: CT CHEST, ABDOMEN, AND PELVIS WITH CONTRAST  TECHNIQUE: Multidetector CT imaging of the chest, abdomen and pelvis was performed following the standard protocol during bolus administration of intravenous  contrast.  CONTRAST:  161m ISOVUE-300 IOPAMIDOL (ISOVUE-300) INJECTION 61%  COMPARISON:  None.  FINDINGS: CT CHEST FINDINGS  Cardiovascular: The heart is normal in size. No pericardial effusion. Small amount of pericardial fluid. The aorta is normal in caliber. No dissection. No atherosclerotic calcifications. The branch vessels are normal.  Mediastinum/Nodes: No mediastinal or hilar mass or lymphadenopathy. Calcified right paratracheal lymph nodes are noted. The esophagus is grossly normal.  Lungs/Pleura: 5 mm pulmonary nodule in the right lower lobe on image number are 94. Subpleural 5 mm pulmonary nodule in the right lower lobe on image number 87. These nodules are indeterminate and will require surveillance. Small calcified granuloma in the right lower lobe on image number 125. These nodules are indeterminate.  Chest wall/ Musculoskeletal: There are multiple right breast masses 10 and 18 mm nodules are noted in the upper right breast. There is a 2.2 x 1.8 cm mass in the upper right lateral breast and 3 masses in the lower right breast the 2 largest measures 2.4 x 1.8 cm and 3.0 x 2.3 cm. Findings suspicious for chest wall tumor along the inferior and lateral aspect of the breast.  There are enlarged right axillary and subpectoral lymph nodes consistent with local metastatic disease. 10 mm lymph node on image 17. 12.5 mm subpectoral node on image 19. 14 mm axillary lymph node on image number 23.  The left breast appears grossly normal. No left axillary lymph nodes are identified. No supraclavicular lymphadenopathy. No internal mammary lymphadenopathy.  No worrisome lytic or sclerotic bone lesions to suggest metastatic disease.  CT ABDOMEN PELVIS FINDINGS  Hepatobiliary: No focal hepatic lesions to suggest hepatic metastatic disease. The gallbladder is normal. No common bile duct dilatation.  Pancreas: No mass, inflammation or ductal  dilatation.  Spleen: Normal size.  No lesions.  Adrenals/Urinary Tract: The adrenal glands, kidneys and bladder are unremarkable.  Stomach/Bowel: The stomach, duodenum, small bowel and colon are grossly normal with oral contrast. No inflammatory changes, mass lesions or obstructive findings. The appendix is normal.  Vascular/Lymphatic: The aorta is normal in caliber. No dissection. The branch vessels are patent. The major venous structures are patent. No mesenteric or retroperitoneal mass or adenopathy. Small scattered lymph nodes are noted.  Reproductive: The uterus and ovaries are unremarkable.  Other: No pelvic mass or adenopathy. No free pelvic fluid collections. No inguinal mass or adenopathy. No abdominal wall hernia or subcutaneous lesions.  Musculoskeletal: No worrisome bone lesions to suggest metastatic disease.  IMPRESSION: 1. Numerous right breast masses and metastatic right axillary lymphadenopathy. Findings suspicious for chest wall invasion along the lower lateral aspect of the breast. 2. Two small indeterminate right lower lobe pulmonary nodules will require surveillance. 3. No findings for metastatic disease involving the abdomen/pelvis or osseous structures.   Electronically Signed   By: PMarijo SanesM.D.   On: 05/18/2017 08:42   Bone scan: 05/15/17 CLINICAL DATA:  Recent diagnosis of breast cancer. Left lower extremity prior surgery 03/03/2017.  EXAM: NUCLEAR MEDICINE WHOLE BODY BONE SCAN  TECHNIQUE: Whole body anterior and posterior images were obtained approximately 3 hours after intravenous injection of radiopharmaceutical.  RADIOPHARMACEUTICALS:  MCi Technetium-926mDP IV  COMPARISON:  Left lower extremity 17 2018.  FINDINGS: Bilateral renal function excretion. Increased activity  noted over the left tibia primarily distally consistent with recent fracture and surgical plate and screw fixation. Mild increase in  activity noted over the proximal and mid tibial most likely from prior trauma/surgery. No other significant abnormal bony increased activity noted.  IMPRESSION: No evidence of metastatic disease.   Electronically Signed   By: Marcello Moores  Register   On: 05/15/2017 15:10     PATHOLOGY:  (R) breast biopsy x 2 and axillary LN biopsy: 05/12/17         Bilateral MRI Breast w/ and w/o contrast 07/21/17 IMPRESSION: 1. There is no significant residual enhancement in the right breast status post neoadjuvant chemotherapy of the patient's known multicentric malignancy. In the lower outer quadrant of the right breast, there is abnormal tissue extending from the skin surface to the underlying pectoralis musculature with loss of fat plane. As there is no enhancement post neoadjuvant chemotherapy, I cannot determine whether the musculature itself has been invaded by tumor.  2. There is a prominent irregular right axillary lymph node which may represent the known metastatic right axillary lymph node, however susceptibility artifact is not seen within the lymph node for confirmation. Several abnormal nodes were seen on the diagnostic workup. Note that there are two prominent lymph nodes in the low right axilla, distant from the others, likely around the 8-9:00 axis from the nipple.  3.  No MRI evidence of malignancy in the left breast.      ASSESSMENT & PLAN:   Stage IIIB ([id number]) invasive ductal carcinoma of (R) breast; ER+/PR+/HER2+:  -Diagnosed in 04/2017. Staging imaging with CT chest/abd/pelvis and bone scan were negative for distant metastatic or osseous metastatic disease.  Currently undergoing neoadjuvant chemo with Taxotere/Carbo/Herceptin/Perjeta.  -Reviewed patient's MRI breast in detail with her and her husband today. She had a great response to chemo and the multicentric mass is no longer enhancing. On exam her breast mass and axillary lymph nodes are no longer  palpable.  -Labs reviewed. Proceed with cycle 4 of TCH-P today. -ECHO is scheduled for 08/05/17. - We will refer her back to Dr. Constance Haw at the completion of chemo with cycle 6.  Depending on treatment response and surgery type, she may require adjuvant radiation therapy. She understands that she will continue anti-HER2 therapy with Herceptin +/- Perjeta for a total of 1 year of therapy (17 treatments total).  She will also require anti-estrogen therapy as well.     Syncopal episode: -May be related to vasovagal since patient was getting up off the toilet. -I have advised her to maintain adequate hydration at home and if she feels dehydrated, she is to let us know so we may give her IVFs.   Dispo:  -RTC in 3 weeks for follow up and cycle 5 of chemo.  All questions were answered to patient's stated satisfaction. Encouraged patient to call with any new concerns or questions before her next visit to the cancer center and we can certain see her sooner, if needed.    Twana First, MD

## 2017-07-29 NOTE — Patient Instructions (Signed)
[facility] at [facility] Discharge Instructions  RECOMMENDATIONS MADE BY THE CONSULTANT AND ANY TEST RESULTS WILL BE SENT TO YOUR REFERRING PHYSICIAN.  You were seen today by Dr. Louise Zhou    Thank you for choosing [facility] at [facility] to provide your oncology and hematology care.  To afford each patient quality time with our provider, please arrive at least 15 minutes before your scheduled appointment time.    If you have a lab appointment with the Cancer Center please come in thru the  Main Entrance and check in at the main information desk  You need to re-schedule your appointment should you arrive 10 or more minutes late.  We strive to give you quality time with our providers, and arriving late affects you and other patients whose appointments are after yours.  Also, if you no show three or more times for appointments you may be dismissed from the clinic at the providers discretion.     Again, thank you for choosing [facility].  Our hope is that these requests will decrease the amount of time that you wait before being seen by our physicians.       _____________________________________________________________  Should you have questions after your visit to [facility], please contact our office at [phone] between the hours of 8:30 a.m. and 4:30 p.m.  Voicemails left after 4:30 p.m. will not be returned until the following business day.  For prescription refill requests, have your pharmacy contact our office.       Resources For Cancer Patients and their Caregivers ? American Cancer Society: Can assist with transportation, wigs, general needs, runs Look Good Feel Better.        1-888-227-6333 ? Cancer Care: Provides financial assistance, online support groups, medication/co-pay assistance.  1-800-813-HOPE (4673) ? [facility] Assists [place] cancer patients and their  families through emotional , educational and financial support.  336-427-4357 ? [place] DSS Where to apply for food stamps, Medicaid and utility assistance. 336-342-1394 ? [facility]: Transportation to medical appointments. 336-347-2287 ? Social Security Administration: May apply for disability if have a Stage IV cancer. 336-342-7796 1-800-772-1213 ? [place] Aging, Disability and Transit Services: Assists with nutrition, care and transit needs. 336-349-2343  Cancer Center Support Programs: @10RELATIVEDAYS@ > Cancer Support Group  2nd Tuesday of the month 1pm-2pm, Journey Room  > Creative Journey  3rd Tuesday of the month 1130am-1pm, Journey Room  > Look Good Feel Better  1st Wednesday of the month 10am-12 noon, Journey Room (Call American Cancer Society to register 1-800-395-5775)    

## 2017-07-29 NOTE — Progress Notes (Signed)
Tolerated infusion w/o adverse reaction.  Alert, in no distress.  VSS.  Discharged ambulatory in c/o spouse.  

## 2017-08-05 ENCOUNTER — Ambulatory Visit (HOSPITAL_COMMUNITY)
Admission: RE | Admit: 2017-08-05 | Discharge: 2017-08-05 | Disposition: A | Discharge status: Home / Self Care | Payer: Self-pay | Source: Ambulatory Visit | Attending: Adult Health | Admitting: Adult Health

## 2017-08-05 DIAGNOSIS — Z87891 Personal history of nicotine dependence: Secondary | ICD-10-CM | POA: Insufficient documentation

## 2017-08-05 DIAGNOSIS — C50911 Malignant neoplasm of unspecified site of right female breast: Secondary | ICD-10-CM

## 2017-08-05 DIAGNOSIS — C50811 Malignant neoplasm of overlapping sites of right female breast: Secondary | ICD-10-CM | POA: Insufficient documentation

## 2017-08-05 DIAGNOSIS — Z170 Estrogen receptor positive status [ER+]: Secondary | ICD-10-CM | POA: Insufficient documentation

## 2017-08-05 DIAGNOSIS — I313 Pericardial effusion (noninflammatory): Secondary | ICD-10-CM | POA: Insufficient documentation

## 2017-08-05 LAB — ECHOCARDIOGRAM COMPLETE
AVLVOTPG: 5 mmHg
CHL CUP DOP CALC LVOT VTI: 23.3 cm
CHL CUP MV DEC (S): 215
CHL CUP TV REG PEAK VELOCITY: 205 cm/s
EERAT: 7.14
EWDT: 215 ms
FS: 41 % (ref 28–44)
IV/PV OW: 0.72
LA ID, A-P, ES: 24 mm
LA diam end sys: 24 mm
LA vol A4C: 18.7 ml
LADIAMINDEX: 1.47 cm/m2
LV E/e' medial: 7.14
LV PW d: 9.39 mm — AB (ref 0.6–1.1)
LV dias vol: 57 mL (ref 46–106)
LV e' LATERAL: 11.1 cm/s
LV sys vol index: 11 mL/m2
LVDIAVOLIN: 35 mL/m2
LVEEAVG: 7.14
LVOT area: 2.84 cm2
LVOT diameter: 19 mm
LVOT peak vel: 113 cm/s
LVOTSV: 66 mL
LVSYSVOL: 18 mL (ref 14–42)
MV Peak grad: 3 mmHg
MVPKAVEL: 70.6 m/s
MVPKEVEL: 79.2 m/s
RV LATERAL S' VELOCITY: 11.4 cm/s
RV TAPSE: 19.5 mm
RV sys press: 20 mmHg
Simpson's disk: 69
Stroke v: 39 ml
TDI e' lateral: 11.1
TDI e' medial: 9.36
TR max vel: 205 cm/s

## 2017-08-05 NOTE — Progress Notes (Signed)
*  PRELIMINARY RESULTS* Echocardiogram 2D Echocardiogram has been performed.  Angel Hale 08/05/2017, 2:04 PM

## 2017-08-07 ENCOUNTER — Encounter (HOSPITAL_COMMUNITY): Payer: Self-pay | Admitting: Adult Health

## 2017-08-07 ENCOUNTER — Telehealth (HOSPITAL_COMMUNITY): Payer: Self-pay | Admitting: Emergency Medicine

## 2017-08-07 ENCOUNTER — Other Ambulatory Visit (HOSPITAL_COMMUNITY): Payer: Self-pay | Admitting: Adult Health

## 2017-08-07 NOTE — Progress Notes (Signed)
Late entry from 08/05/17.  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  She requested to speak with me.   She tells me that she has been struggling with burning pain/tingling/numbness to her feet since her last cycle of chemo.  It has become difficult for her to walk.  She is concerned about this and wants to know if she can take any herbal or holistic medications to try to help.  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  I am not aware of any herbal or holistic medications/supplements available to treat peripheral neuropathy.  There is limited data that acupuncture can be helpful (which we can discuss in the future).  Discussed trial of low-dose/dose-escalation of gabapentin to help with her symptoms.  We could also dose-reduce her Taxotere for remaining 2 cycles of treatment.  She will think about these options.    Update:  -Ms. Caprice Beaver called our nurse navigator, Joanne Gavel, on 08/07/17. She shared with Anderson Malta that she would prefer her chemo be dose-reduced rather than trying another medication (gabapentin).  Discussed with Dr. Talbert Cage and this is appropriate.  Taxotere has been dose reduced by ~25% for remaining 2 cycles; treatment plan changed accordingly.    Mike Craze, NP [place] [phone]

## 2017-08-07 NOTE — Telephone Encounter (Signed)
Pt c/o of neuropathy in the foot that she had surgery on.  She has been working with it some and it is getting a little better.  She has expressed these concerns to Mike Craze NP and she has offered medication or dose reduction in future cycles of chemo.  Angel Hale has thought about it and she would rather not start any more medication and would like to have future cycle dose reduced.  RN has sent message to provider with this information.

## 2017-08-20 ENCOUNTER — Encounter (HOSPITAL_COMMUNITY): Payer: Self-pay | Admitting: Adult Health

## 2017-08-20 ENCOUNTER — Other Ambulatory Visit: Payer: Self-pay

## 2017-08-20 ENCOUNTER — Encounter (HOSPITAL_BASED_OUTPATIENT_CLINIC_OR_DEPARTMENT_OTHER): Payer: Self-pay | Admitting: Adult Health

## 2017-08-20 ENCOUNTER — Encounter (HOSPITAL_COMMUNITY): Payer: Self-pay | Attending: Adult Health

## 2017-08-20 ENCOUNTER — Encounter: Payer: Self-pay | Admitting: Oncology

## 2017-08-20 VITALS — BP 108/65 | HR 78 | Temp 98.3°F | Resp 18 | Wt 131.8 lb

## 2017-08-20 DIAGNOSIS — Z5112 Encounter for antineoplastic immunotherapy: Secondary | ICD-10-CM

## 2017-08-20 DIAGNOSIS — C773 Secondary and unspecified malignant neoplasm of axilla and upper limb lymph nodes: Secondary | ICD-10-CM

## 2017-08-20 DIAGNOSIS — Z170 Estrogen receptor positive status [ER+]: Secondary | ICD-10-CM | POA: Insufficient documentation

## 2017-08-20 DIAGNOSIS — Z5111 Encounter for antineoplastic chemotherapy: Secondary | ICD-10-CM

## 2017-08-20 DIAGNOSIS — Z5189 Encounter for other specified aftercare: Secondary | ICD-10-CM

## 2017-08-20 DIAGNOSIS — C50811 Malignant neoplasm of overlapping sites of right female breast: Secondary | ICD-10-CM

## 2017-08-20 DIAGNOSIS — G9009 Other idiopathic peripheral autonomic neuropathy: Secondary | ICD-10-CM

## 2017-08-20 DIAGNOSIS — C50911 Malignant neoplasm of unspecified site of right female breast: Secondary | ICD-10-CM

## 2017-08-20 LAB — CBC WITH DIFFERENTIAL/PLATELET
BASOS PCT: 0 %
Basophils Absolute: 0 10*3/uL (ref 0.0–0.1)
EOS ABS: 0 10*3/uL (ref 0.0–0.7)
Eosinophils Relative: 0 %
HCT: 35.3 % — ABNORMAL LOW (ref 36.0–46.0)
Hemoglobin: 11.7 g/dL — ABNORMAL LOW (ref 12.0–15.0)
Lymphocytes Relative: 11 %
Lymphs Abs: 1.2 10*3/uL (ref 0.7–4.0)
MCH: 33.3 pg (ref 26.0–34.0)
MCHC: 33.1 g/dL (ref 30.0–36.0)
MCV: 100.6 fL — ABNORMAL HIGH (ref 78.0–100.0)
MONO ABS: 1 10*3/uL (ref 0.1–1.0)
MONOS PCT: 9 %
NEUTROS PCT: 80 %
Neutro Abs: 8.6 10*3/uL — ABNORMAL HIGH (ref 1.7–7.7)
Platelets: 311 10*3/uL (ref 150–400)
RBC: 3.51 MIL/uL — ABNORMAL LOW (ref 3.87–5.11)
RDW: 16.4 % — AB (ref 11.5–15.5)
WBC: 10.7 10*3/uL — ABNORMAL HIGH (ref 4.0–10.5)

## 2017-08-20 LAB — COMPREHENSIVE METABOLIC PANEL
ALK PHOS: 90 U/L (ref 38–126)
ALT: 34 U/L (ref 14–54)
AST: 31 U/L (ref 15–41)
Albumin: 3.8 g/dL (ref 3.5–5.0)
Anion gap: 9 (ref 5–15)
BILIRUBIN TOTAL: 0.4 mg/dL (ref 0.3–1.2)
BUN: 11 mg/dL (ref 6–20)
CO2: 25 mmol/L (ref 22–32)
CREATININE: 0.6 mg/dL (ref 0.44–1.00)
Calcium: 9.4 mg/dL (ref 8.9–10.3)
Chloride: 104 mmol/L (ref 101–111)
Glucose, Bld: 92 mg/dL (ref 65–99)
Potassium: 3.6 mmol/L (ref 3.5–5.1)
Sodium: 138 mmol/L (ref 135–145)
TOTAL PROTEIN: 6.9 g/dL (ref 6.5–8.1)

## 2017-08-20 MED ORDER — ACETAMINOPHEN 325 MG PO TABS
650.0000 mg | ORAL_TABLET | Freq: Once | ORAL | Status: AC
  Administered 2017-08-20: 650 mg via ORAL
  Filled 2017-08-20: qty 2

## 2017-08-20 MED ORDER — DIPHENHYDRAMINE HCL 25 MG PO CAPS
50.0000 mg | ORAL_CAPSULE | Freq: Once | ORAL | Status: AC
  Administered 2017-08-20: 50 mg via ORAL
  Filled 2017-08-20: qty 2

## 2017-08-20 MED ORDER — SODIUM CHLORIDE 0.9 % IV SOLN
420.0000 mg | Freq: Once | INTRAVENOUS | Status: AC
  Administered 2017-08-20: 420 mg via INTRAVENOUS
  Filled 2017-08-20: qty 14

## 2017-08-20 MED ORDER — SODIUM CHLORIDE 0.9% FLUSH
10.0000 mL | INTRAVENOUS | Status: DC | PRN
  Administered 2017-08-20: 10 mL
  Filled 2017-08-20: qty 10

## 2017-08-20 MED ORDER — SODIUM CHLORIDE 0.9 % IV SOLN
600.0000 mg | Freq: Once | INTRAVENOUS | Status: AC
  Administered 2017-08-20: 600 mg via INTRAVENOUS
  Filled 2017-08-20: qty 60

## 2017-08-20 MED ORDER — PALONOSETRON HCL INJECTION 0.25 MG/5ML
0.2500 mg | Freq: Once | INTRAVENOUS | Status: AC
  Administered 2017-08-20: 0.25 mg via INTRAVENOUS
  Filled 2017-08-20: qty 5

## 2017-08-20 MED ORDER — DOCETAXEL CHEMO INJECTION 160 MG/16ML
56.0000 mg/m2 | Freq: Once | INTRAVENOUS | Status: AC
  Administered 2017-08-20: 90 mg via INTRAVENOUS
  Filled 2017-08-20: qty 9

## 2017-08-20 MED ORDER — HEPARIN SOD (PORK) LOCK FLUSH 100 UNIT/ML IV SOLN
500.0000 [IU] | Freq: Once | INTRAVENOUS | Status: AC | PRN
  Administered 2017-08-20: 500 [IU]
  Filled 2017-08-20: qty 5

## 2017-08-20 MED ORDER — PEGFILGRASTIM 6 MG/0.6ML ~~LOC~~ PSKT
6.0000 mg | PREFILLED_SYRINGE | Freq: Once | SUBCUTANEOUS | Status: AC
  Administered 2017-08-20: 6 mg via SUBCUTANEOUS
  Filled 2017-08-20: qty 0.6

## 2017-08-20 MED ORDER — DEXAMETHASONE SODIUM PHOSPHATE 10 MG/ML IJ SOLN
10.0000 mg | Freq: Once | INTRAMUSCULAR | Status: AC
  Administered 2017-08-20: 10 mg via INTRAVENOUS
  Filled 2017-08-20: qty 1

## 2017-08-20 MED ORDER — SODIUM CHLORIDE 0.9 % IV SOLN
6.0000 mg/kg | Freq: Once | INTRAVENOUS | Status: AC
  Administered 2017-08-20: 336 mg via INTRAVENOUS
  Filled 2017-08-20: qty 16

## 2017-08-20 MED ORDER — SODIUM CHLORIDE 0.9 % IV SOLN: 10.0000 mg | Freq: Once | INTRAVENOUS | Status: DC

## 2017-08-20 MED ORDER — SODIUM CHLORIDE 0.9 % IV SOLN
Freq: Once | INTRAVENOUS | Status: AC
  Administered 2017-08-20: 10:00:00 via INTRAVENOUS

## 2017-08-20 NOTE — Progress Notes (Signed)
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 Ki67 80%.        05/12/2017 Procedure    Additional (R) breast biopsy (UOQ): IDC, grade 3; ER+ (80%), PR+ (50%), HER2+ (ratio 2.77), Ki67 90%.       05/12/2017 Procedure    (R) axillary lymph node biopsy: Metastatic carcinoma; ER+ (80%), PR+ (30%), HER2+ (ratio 2.99), Ki67 80%.       05/12/2017 Imaging    Mammogram/breast US: Targeted ultrasound is performed, showing multiple irregular hypoechoic masses. In the 6 o'clock region of the right breast is a large irregular mass spans at least 4.8 cm. In the 4:00 to 5:00 region of the right breast an irregular mass measures 4.5 cm. In the 930-10 o'clock position of the right breast centered 9-10 cm from the nipple is an irregular 4.0 x 3.8 x 1.9 cm hypoechoic mass. In the 9:30 position of the right breast at the lateral margin is a probable abnormal intramammary lymph node measuring 1.3 cm greatest diameter. In the 10 o'clock position of the right breast far lateral is a hypoechoic dermal nodule that measures 0.5 cm. Diffuse skin thickening of the right breast measures up to 4.5 mm.      05/14/2017 Tumor Marker      Ref. Range 05/14/2017 15:30  CA 15-3 Latest Ref Range: 0.0 - 25.0 U/mL 115.9 (H)     Ref. Range 05/14/2017 15:30  CA 27.29 Latest Ref Range: 0.0 - 38.6 U/mL 157.7 (H)        05/15/2017 Imaging    CT chest/abd/pelvis:  IMPRESSION: 1.  Numerous right breast masses and metastatic right axillary lymphadenopathy. Findings suspicious for chest wall invasion along the lower lateral aspect of the breast. 2. Two small indeterminate right lower lobe pulmonary nodules will require surveillance. 3. No findings for metastatic disease involving the abdomen/pelvis or osseous structures.      05/15/2017 Imaging    Bone scan:  IMPRESSION: No evidence of metastatic disease.      05/18/2017 Echocardiogram    LV EF: 60% -   65%      05/20/2017 Procedure    Port-a-cath placement       05/25/2017 Initial Diagnosis    Malignant neoplasm of overlapping sites of right breast in female, estrogen receptor positive ([place])      05/27/2017 -  Neo-Adjuvant Chemotherapy    TCHP         HISTORY OF PRESENT ILLNESS:  (From Dr. Laverle Patter note on 06/17/17)  Angel Hale 46 y.o.premenopausal female presents today for evaluation of high-grade right breast cancer. Patient states that 2 years ago Angel Hale initially noted a mass in her right breast, however patient thought that it was a cyst and did not get any evaluation done. Her symptoms have worsened over time and Angel Hale now has an open lesion which is draining in the inferior right breast along the inframammary fold as well as multiple masses in her breast. Patient is uninsured and does  not have a PCP, so Angel Hale was not receiving regular care.   Angel Hale underwent a bilateral diagnostic mammogram with ultrasound on 05/12/2017 which demonstrated locally advanced multicentric neglected right breast cancer with a fungating mass along the inframammary fold and diffuse skin thickening. Right axillary and right infraclavicular pathologic lymphadenopathy.   Patient underwent ultrasound-guided biopsy on 05/12/2017 of the right medial lower inner quadrant breast which demonstrated invasive ductal carcinoma grade 3, biopsy of right upper outer quadrant breast demonstrated invasive ductal carcinoma grade 3, biopsy of  right axillary lymph node demonstrated lymph node with metastatic carcinoma.  Staging bone scan 05/15/2017 demonstrated no evidence of metastatic disease.   Staging CT chest/abdomen/pelvis 05/15/17 demonstrated numerous right breast masses and metastatic right axillary lymphadenopathy. Findings suspicious for chest wall invasion along the lower lateral aspect of the breast. Two small indeterminate right lower lobe pulmonary nodules will require surveillance. No findings for metastatic disease involving the abdomen/pelvis or osseous structures.   Tumor markers: CA 15-3 on 05/14/2017 was 115.9. CA-27-29 on 05/14/2017 was 157.7.   Angel Hale is currently on Bactrim for the open wound on her right breast.  Patient's case is complicated in that Angel Hale had a horseback riding accident in July and underwent an OPEN REDUCTION INTERNAL FIXATION (ORIF) LEFT PILON FRACTURE AND LEFT LATERAL MALLEOLUS on 03/03/17.  However since then, Angel Hale's had complications with a screw sticking out of her foot. Angel Hale is seeing her orthopedic surgeon this upcoming Thursday to see one when Angel Hale will have surgery to get this fixed.  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   Angel Hale had removal of her protruding hardware in her left ankle on 05/21/2017. Angel Hale stated the procedure went well and Angel Hale is not noted any bleeding.   Patient also had a left chest wall chemotherapy port placed on 05/20/2017.   ECHO on 05/18/17 showed an EF of 60-65%.     INTERVAL HISTORY:  Angel Hale 45 y.o. female returns for routine follow-up for right breast cancer.   Here with her husband.   Overall, Angel Hale tells me Angel Hale has been feeling "pretty good." Appetite 100%; Angel Hale supplements her diet with Boost 1-2 times/week. Energy levels 50%; Angel Hale feels tired and weak at times.    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 Angel Hale continues to have  numbness/burning to her (L) ankle/foot; ice, rest, and massage are helpful.  We discussed at previous visits and Angel Hale asked that her chemo be dose-reduced rather than starting a new medication, like gabapentin, for the neuropathy.   Angel Hale has had 2 different episodes, of what sounds like vasovagal responses.  First episode happened after cycle #3 when Angel Hale was constipated, became dizzy while having a bowel movement, passed out and fell off the toilet hitting her nose.  This has since healed.  After cycle #4, Angel Hale had another episode where Angel Hale tells me Angel Hale woke up in the middle of the night with stomach cramps (feeling like Angel Hale needed to have a BM) and felt dizzy. Reports that during this time Angel Hale was not consistent with drinking enough water. Denies passing out, but did get off the toilet and laid on the floor. Angel Hale tells me that Angel Hale then "the stool just came out of me."  Angel Hale forced fluids later and these symptoms/episodes have not recurred.     Overall, Angel Hale feels ready for next cycle of chemotherapy today.      REVIEW OF SYSTEMS:  Review of Systems  Constitutional: Positive for fatigue. Negative for chills and fever.  HENT:  Negative.   Eyes: Negative.   Respiratory: Negative.   Cardiovascular: Negative.   Gastrointestinal: Positive for constipation, diarrhea and nausea. Negative for vomiting.  Endocrine: Negative.   Genitourinary: Negative.    Musculoskeletal: Negative.   Skin: Negative.   Neurological: Positive for numbness.  Hematological: Bruises/bleeds easily.  Psychiatric/Behavioral: Negative.      PAST MEDICAL/SURGICAL HISTORY:  Past Medical History:  Diagnosis Date  . Cancer [facility])    breast cancer   Past Surgical History:  Procedure Laterality Date  . BUNIONECTOMY Left 2000  . EXTERNAL FIXATION LEG Left 02/16/2017   Procedure: EXTERNAL FIXATION LEG;  Surgeon: Mcarthur Rossetti, MD;  Location: [place];  Service: Orthopedics;  Laterality: Left;  . HARDWARE REMOVAL Left  05/21/2017   Procedure: REMOVAL OF RETAINED PROMINENT SCREW LEFT ANKLE;  Surgeon: Mcarthur Rossetti, MD;  Location: [place];  Service: Orthopedics;  Laterality: Left;  . ORIF ANKLE FRACTURE Left 03/03/2017  . ORIF ANKLE FRACTURE Left 03/03/2017   Procedure: OPEN REDUCTION INTERNAL FIXATION (ORIF) LEFT PILON FRACTURE AND LEFT LATERAL MALLEOLUS;  Surgeon: Mcarthur Rossetti, MD;  Location: [place];  Service: Orthopedics;  Laterality: Left;  . OVARIAN CYST REMOVAL Left 1991  . PORTACATH PLACEMENT    . PORTACATH PLACEMENT Left 05/20/2017   Procedure: INSERTION PORT-A-CATH;  Surgeon: Virl Cagey, MD;  Location: [facility];  Service: General;  Laterality: Left;  . WISDOM TOOTH EXTRACTION       SOCIAL HISTORY:  Social History   Socioeconomic History  . Marital status: Married    Spouse name: Not on file  . Number of children: Not on file  . Years of education: Not on file  . Highest education level: Not on file  Social Needs  . Financial resource strain: Not on file  . Food insecurity - worry: Not on file  . Food insecurity - inability: Not on file  . Transportation needs - medical: Not on file  . Transportation needs - non-medical: Not on file  Occupational History  . Not on file  Tobacco Use  . Smoking status: Former Smoker    Packs/day: 1.00    Years: 10.00    Pack years: 10.00    Types: Cigarettes    Last attempt to quit: 02/16/2017    Years since quitting: 0.5  . Smokeless tobacco: Never Used  Substance and Sexual Activity  . Alcohol use: Yes    Comment: occasional  . Drug use: No  . Sexual activity: Not Currently    Birth control/protection: None  Other Topics Concern  . Not on file  Social History Narrative  . Not on file    FAMILY HISTORY:  Family History  Problem Relation Age of Onset  . Colon cancer Father     CURRENT MEDICATIONS:  Outpatient Encounter Medications as of 08/20/2017  Medication Sig  . acetaminophen (TYLENOL) 500 MG tablet Take 1,000  mg by mouth every 6 (six) hours as needed (for pain.).  Marland Kitchen CARBOPLATIN IV Inject into the vein. Every 3 weeks  . Coenzyme Q10 (CO Q-10 PO) Take 1 tablet by mouth daily.  . Cyanocobalamin (B-12 PO) Take 1 tablet by mouth daily.  Marland Kitchen dexamethasone (DECADRON) 4 MG tablet Take 2 tablets (8 mg total) by mouth 2 (two) times daily. Start the day before Taxotere. Then again the day after chemo for 3 days.  . DOCEtaxel (TAXOTERE IV) Inject into the  vein. Every 3 weeks  . Homeopathic Products (FRANKINCENSE UPLIFTING) OIL Take 1 capsule by mouth daily as needed (for home remedy).  Marland Kitchen HYDROcodone-acetaminophen (NORCO) 5-325 MG tablet Take 1 tablet by mouth every 4 (four) hours as needed for moderate pain.  Marland Kitchen LAVENDER OIL PO Take 1 capsule by mouth daily as needed (for home remedy).  Marland Kitchen lidocaine-prilocaine (EMLA) cream Apply to affected area once  . LORazepam (ATIVAN) 0.5 MG tablet Take 1 tablet (0.5 mg total) by mouth every 6 (six) hours as needed (Nausea or vomiting).  . methocarbamol (ROBAXIN) 500 MG tablet Take 1 tablet (500 mg total) by mouth every 6 (six) hours as needed for muscle spasms.  . Multiple Vitamins-Minerals (ZINC PO) Take 1 capsule by mouth daily.  . ondansetron (ZOFRAN) 8 MG tablet Take 1 tablet (8 mg total) by mouth 2 (two) times daily as needed for refractory nausea / vomiting. Start on day 3 after chemo.  Marland Kitchen Pegfilgrastim (NEULASTA ONPRO [place]) Inject into the skin. Every 3 weeks  . Pertuzumab (PERJETA IV) Inject into the vein. Every 3 weeks  . prochlorperazine (COMPAZINE) 10 MG tablet Take 1 tablet (10 mg total) by mouth every 6 (six) hours as needed (Nausea or vomiting).  . Trastuzumab (HERCEPTIN IV) Inject into the vein. Every 3 weeks  . vitamin C (ASCORBIC ACID) 500 MG tablet Take 1 tablet (500 mg total) by mouth daily.   No facility-administered encounter medications on file as of 08/20/2017.     ALLERGIES:  Allergies  Allergen Reactions  . Penicillins Nausea And Vomiting    Has  patient had a PCN reaction causing immediate rash, facial/tongue/throat swelling, SOB or lightheadedness with hypotension: No Has patient had a PCN reaction causing severe rash involving mucus membranes or skin necrosis: No Has patient had a PCN reaction that required hospitalization: No Has patient had a PCN reaction occurring within the last 10 years: No If all of the above answers are "NO", then may proceed with Cephalosporin use.      PHYSICAL EXAM:  ECOG Performance status: 0-1 - Mild symptoms; remains independent   BP 127/74 HR 77 Resp 16 Temp 97.5 O2 sat 100%  Weight: 131.8 lbs     Physical Exam  Constitutional: Angel Hale is oriented to person, place, and time and well-developed, well-nourished, and in no distress.  Seen in chemo chair in infusion area   HENT:  Head: Normocephalic.  Mouth/Throat: Oropharynx is clear and moist.  Eyes: Conjunctivae are normal. No scleral icterus.  Neck: Normal range of motion. Neck supple.  Cardiovascular: Normal rate and regular rhythm.  Pulmonary/Chest: Effort normal and breath sounds normal. No respiratory distress.  Abdominal: Soft. Bowel sounds are normal. There is no tenderness.  Musculoskeletal: Normal range of motion. Angel Hale exhibits no edema.  Lymphadenopathy:    Angel Hale has no cervical adenopathy.       Right: No supraclavicular adenopathy present.       Left: No supraclavicular adenopathy present.  Neurological: Angel Hale is alert and oriented to person, place, and time. No cranial nerve deficit.  Skin: Skin is warm and dry. No rash noted.  Psychiatric: Mood, memory, affect and judgment normal.  Nursing note and vitals reviewed.    LABORATORY DATA:  I have reviewed the labs as listed.  CBC    Component Value Date/Time   WBC 10.7 (H) 08/20/2017 0908   RBC 3.51 (L) 08/20/2017 0908   HGB 11.7 (L) 08/20/2017 0908   HCT 35.3 (L) 08/20/2017 0908   PLT 311 08/20/2017  0908   MCV 100.6 (H) 08/20/2017 0908   MCH 33.3 08/20/2017 0908   MCHC  33.1 08/20/2017 0908   RDW 16.4 (H) 08/20/2017 0908   LYMPHSABS 1.2 08/20/2017 0908   MONOABS 1.0 08/20/2017 0908   EOSABS 0.0 08/20/2017 0908   BASOSABS 0.0 08/20/2017 0908   CMP Latest Ref Rng & Units 08/20/2017 07/29/2017 07/08/2017  Glucose 65 - 99 mg/dL 92 93 93  BUN 6 - 20 mg/dL _0 Creatinine 0.44 - 1.00 mg/dL 0.60 0.57 0.54  Sodium 135 - 145 mmol/L 138 137 135  Potassium 3.5 - 5.1 mmol/L 3.6 3.7 4.0  Chloride 101 - 111 mmol/L 104 103 102  CO2 22 - 32 mmol/L _1 Calcium 8.9 - 10.3 mg/dL 9.4 9.6 9.6  Total Protein 6.5 - 8.1 g/dL 6.9 7.2 7.1  Total Bilirubin 0.3 - 1.2 mg/dL 0.4 0.5 0.6  Alkaline Phos 38 - 126 U/L 90 83 91  AST 15 - 41 U/L _2 ALT 14 - 54 U/L 34 30 41    PENDING LABS:    DIAGNOSTIC IMAGING:  *The following radiologic images and reports have been reviewed independently and agree with below findings.  MRI breasts: 07/21/17 CLINICAL DATA:  46 year old female with current diagnosis of right breast invasive ductal carcinoma presenting for MRI status post neoadjuvant chemotherapy which began on 05/27/2017. Angel Hale has had biopsies of 2 sites in the right breast, both positive for malignancy, as well as ultrasound-guided biopsy of a right axillary lymph node positive for metastatic disease.  LABS:  GFR > 60  EXAM: BILATERAL BREAST MRI WITH AND WITHOUT CONTRAST  TECHNIQUE: Multiplanar, multisequence MR images of both breasts were obtained prior to and following the intravenous administration of 11 ml of MultiHance.  THREE-DIMENSIONAL MR IMAGE RENDERING ON INDEPENDENT WORKSTATION:  Three-dimensional MR images were rendered by post-processing of the original MR data on an independent workstation. The three-dimensional MR images were interpreted, and findings are reported in the following complete MRI report for this study. Three dimensional images were evaluated at the independent DynaCad workstation  COMPARISON:  No prior MRI  available for comparison. Correlation made with prior mammogram from 05/12/2017.  FINDINGS: Breast composition: c. Heterogeneous fibroglandular tissue.  Background parenchymal enhancement: Moderate.  Right breast: Susceptibility artifact is seen in the posterior depth of the upper outer quadrant of the right breast, and in the middle depth of the lower slightly inner right breast. Both of these indicate sites of biopsy proven malignancy. There is no significant residual enhancement at the biopsy sites or elsewhere in the right breast. There is marked skin thickening diffusely of the right breast. In the posterior lower outer quadrant of the right breast there is abnormal but nonenhancing tissue, likely representing the tumor previously described as fungating at the inframammary fold as it extends to the skin surface. There is loss of fat plane of this tumor with the underlying musculature, however, as there is currently no enhancement status post neoadjuvant chemotherapy, I cannot confirm whether or not the underlying musculature is involved with tumor.  Left breast: A Port-A-Cath this seen in the upper inner left breast. No mass or abnormal enhancement.  Lymph nodes: Susceptibility artifact from the biopsy proven metastatic right axillary lymph node cannot be identified. There is a prominent irregular lymph node seen superiorly in the right axilla, with several smaller surrounding lymph nodes. Separate from the right axillary lymph nodes, there are 2 smaller avidly enhancing lymph nodes are seen  in the low axilla at the 8 9 o'clock axis with respect to the nipple (series [id number], images 133 and 142). No abnormal left axillary lymph nodes are identified.  Ancillary findings:  None.  IMPRESSION: 1. There is no significant residual enhancement in the right breast status post neoadjuvant chemotherapy of the patient's known multicentric malignancy. In the lower outer quadrant  of the right breast, there is abnormal tissue extending from the skin surface to the underlying pectoralis musculature with loss of fat plane. As there is no enhancement post neoadjuvant chemotherapy, I cannot determine whether the musculature itself has been invaded by tumor.  2. There is a prominent irregular right axillary lymph node which may represent the known metastatic right axillary lymph node, however susceptibility artifact is not seen within the lymph node for confirmation. Several abnormal nodes were seen on the diagnostic workup. Note that there are two prominent lymph nodes in the low right axilla, distant from the others, likely around the 8-9:00 axis from the nipple.  3.  No MRI evidence of malignancy in the left breast.  RECOMMENDATION: Treatment plan.  BI-RADS CATEGORY  6: Known biopsy-proven malignancy.   Electronically Signed   By: Ammie Ferrier M.D.   On: 07/21/2017 16:05    ECHO: 08/05/17 Transthoracic Echocardiography  Patient:    Ravan, Schlemmer MR #:       [id number] Study Date: 08/05/2017 Gender:     F Age:        71 Height:     165.1 cm Weight:     58 kg BSA:        1.63 m^2 Pt. Status: Room:   PERFORMING   Rayford Halsted  SONOGRAPHER  Alvino Chapel, RCS  ATTENDING    Laurelyn Sickle, Darcus Edds W  REFERRING    Mike Craze W  cc:  ------------------------------------------------------------------- LV EF: 60% -   65%  ------------------------------------------------------------------- Indications:      Malignant Neoplasm of overlapping sites of right breast, estrogen receptor positive.  ------------------------------------------------------------------- History:   PMH:  Acquired from the patient and from the patient&'s chart.  Risk factors:  Former smoker.  ------------------------------------------------------------------- Study Conclusions  - Left ventricle: The cavity size was  normal. Wall thickness was   normal. Systolic function was normal. The estimated ejection   fraction was in the range of 60% to 65%. This was confirmed with   longitudinal strain rate. Wall motion was normal; there were no   regional wall motion abnormalities. Left ventricular diastolic   function parameters were normal. - Atrial septum: No defect or patent foramen ovale was identified. - Pericardium, extracardiac: Small circumferential pericardial   effusion.  ------------------------------------------------------------------- Study data:  Comparison was made to the study of 04/28/2017.  Study status:  Routine.  Procedure:  The patient reported no pain pre or post test. Transthoracic echocardiography. Image quality was adequate.  Study completion:  There were no complications. Transthoracic echocardiography.  M-mode, complete 2D, spectral Doppler, and color Doppler.  Birthdate:  Patient birthdate: 1971/02/25.  Age:  Patient is 46 yr old.  Sex:  Gender: female. BMI: 21.3 kg/m^2.  Blood pressure:     109/74  Patient status: Inpatient.  Study date:  Study date: 08/05/2017. Study time: 10:41 AM.  Location:  Echo laboratory.  -------------------------------------------------------------------  ------------------------------------------------------------------- Left ventricle:  The cavity size was normal. Wall thickness was normal. Systolic function was normal. The estimated ejection fraction was in the range of 60% to 65%. This was  confirmed with longitudinal strain rate. Wall motion was normal; there were no regional wall motion abnormalities. The transmitral flow pattern was normal. The deceleration time of the early transmitral flow velocity was normal. The pulmonary vein flow pattern was normal. The tissue Doppler parameters were normal. Left ventricular diastolic function parameters were normal.  ------------------------------------------------------------------- Aortic  valve:   Trileaflet.  Doppler:   There was no stenosis. There was no regurgitation.  ------------------------------------------------------------------- Mitral valve:   Structurally normal valve.   Leaflet separation was normal.  Doppler:  Transvalvular velocity was within the normal range. There was no evidence for stenosis. There was trivial regurgitation.    Peak gradient (D): 3 mm Hg.  ------------------------------------------------------------------- Left atrium:  The atrium was normal in size.  ------------------------------------------------------------------- Atrial septum:  No defect or patent foramen ovale was identified.   ------------------------------------------------------------------- Right ventricle:  The cavity size was normal. Wall thickness was normal. Systolic function was normal.  ------------------------------------------------------------------- Pulmonic valve:    The valve appears to be grossly normal. Doppler:  There was no significant regurgitation.  ------------------------------------------------------------------- Tricuspid valve:   Structurally normal valve.   Leaflet separation was normal.  Doppler:  Transvalvular velocity was within the normal range. There was trivial regurgitation.  ------------------------------------------------------------------- Right atrium:  The atrium was normal in size.  ------------------------------------------------------------------- Pericardium:  Small circumferential pericardial effusion.  ------------------------------------------------------------------- Systemic veins: Inferior vena cava: The vessel was normal in size. The respirophasic diameter changes were in the normal range (>= 50%), consistent with normal central venous pressure.      PATHOLOGY:  (R) breast biopsy x 2 and axillary LN biopsy: 05/12/17               ASSESSMENT & PLAN:   Stage IIIB ([id number]) invasive ductal  carcinoma of (R) breast; ER+/PR+/HER2+:  -Diagnosed in 04/2017. Staging imaging with CT chest/abd/pelvis and bone scan were negative for distant metastatic or osseous metastatic disease.  Currently undergoing neoadjuvant chemo with Taxotere/Carbo/Herceptin/Perjeta.   -MRI breasts on 11/27/18nafter 3 cycles of therapy revealed excellent response to therapy thus far; no significant residual enhancement in the (R) breast; there is abnormal tissue extending from the skin surface to underlying pectoralis muscle with loss of fat plane, which was difficult for radiology to determine if muscle itself had been invaded by tumor; prominent irregular (R) axillary LN.  -Due for cycle #5 TCHP today; labs reviewed and are adequate for treatment.  -Most recent ECHO completed on 08/05/17; EF remains normal 60-65%. Angel Hale will be due for next cardiac evaluation on anti-HER2 therapy sometime in 10/2017; will place orders at subsequent follow-up visits.  -Return to cancer center in 3 weeks for follow-up and cycle #6 TCHP.   Possible vasovagal incidences:  -1 episode after cycle #2 where Angel Hale passed out while sitting on the toilet trying to have a bowel movement; Angel Hale reports syncopal episode, when Angel Hale fell off of the toilet and hit her head face first on the floor. Angel Hale had significant nosebleed at that time.  Angel Hale continues to have intermittent, very scant bleeding from her nose (which Angel Hale thinks may be a healed lesion internally in the nose that re-opens when Angel Hale blows her nose; this is certainly a plausible explanation).  -2nd episode after cycle #4 where Angel Hale had near syncopal episode, again after feeling constipated and dehydrated.   -No subsequent episodes. Angel Hale feels well and symptoms resolved when Angel Hale pushed fluids. Encouraged her to let us know if Angel Hale has another incident, and we will further evaluate with MRI brain. I  have low clinical suspicion of brain mets (ie progression of disease) given excellent response to therapy  thus far. However, if symptoms persist then additional evaluation will be important. Angel Hale agrees with this plan.   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  These symptoms have improved somewhat (likely grade 1-2), but still remain, particularly to her (L) ankle/foot where Angel Hale had pervious injury. Angel Hale is able to walk without significant pain any longer.  -Angel Hale is reluctant to try any new medications, like gabapentin. Angel Hale prefers more natural remedies like ice, rest, and massage.   -Taxotere dose-reduced by 25% today for cycles #5 and #6 given her previously documented grade 3 peripheral neuropathy to her feet.            Dispo:  -Return to cancer center in 3 weeks for follow-up and cycle #6 TCHP.    All questions were answered to patient's stated satisfaction. Encouraged patient to call with any new concerns or questions before her next visit to the cancer center and we can certain see her sooner, if needed.     A total of 25 minutes was spent in face-to-face care of this patient, with greater than 50% of that time spent in counseling and care-coordination.     Mike Craze, NP [place] [phone]    Orders placed this encounter:  No orders of the defined types were placed in this encounter.     Mike Craze, NP [place] [phone]

## 2017-08-20 NOTE — Progress Notes (Signed)
Angel Hale tolerated chemo tx with Neulasta on-pro well without complaints or incident. Labs reviewed with Mike Craze NP prior to administering chemotherapy. Neulasta on-pro applied to pt's right arm with green indicator light flashing. VSS upon discharge. Pt discharged self ambulatory in satisfactory condition accompanied by her husband

## 2017-08-20 NOTE — Patient Instructions (Signed)
[facility] Discharge Instructions for Patients Receiving Chemotherapy   Beginning January 23rd 2017 lab work for the [facility] will be done in the  Main lab at [facility] on 1st floor. If you have a lab appointment with the [place] please come in thru the  Main Entrance and check in at the main information desk   Today you received the following chemotherapy agents Taxotere,Carboplatin,Perjeta and Herceptin as well as Neulasta on-pro.Follow-up as scheduled. Call clinic for any questions or concerns  To help prevent nausea and vomiting after your treatment, we encourage you to take your nausea medication   If you develop nausea and vomiting, or diarrhea that is not controlled by your medication, call the clinic.  The clinic phone number is [phone]. Office hours are Monday-Friday 8:30am-5:00pm.  BELOW ARE SYMPTOMS THAT SHOULD BE REPORTED IMMEDIATELY:  *FEVER GREATER THAN 101.0 F  *CHILLS WITH OR WITHOUT FEVER  NAUSEA AND VOMITING THAT IS NOT CONTROLLED WITH YOUR NAUSEA MEDICATION  *UNUSUAL SHORTNESS OF BREATH  *UNUSUAL BRUISING OR BLEEDING  TENDERNESS IN MOUTH AND THROAT WITH OR WITHOUT PRESENCE OF ULCERS  *URINARY PROBLEMS  *BOWEL PROBLEMS  UNUSUAL RASH Items with * indicate a potential emergency and should be followed up as soon as possible. If you have an emergency after office hours please contact your primary care physician or go to the nearest emergency department.  Please call the clinic during office hours if you have any questions or concerns.   You may also contact the Patient Navigator at [phone] should you have any questions or need assistance in obtaining follow up care.      Resources For Cancer Patients and their Caregivers ? American Cancer Society: Can assist with transportation, wigs, general needs, runs Look Good Feel Better.        (223) 283-1782 ? Cancer Care: Provides financial assistance, online support  groups, medication/co-pay assistance.  1-800-813-HOPE 947 050 3133) ? [place] Assists [place] cancer patients and their families through emotional , educational and financial support.  504 181 9426 ? [place] DSS Where to apply for food stamps, Medicaid and utility assistance. (973) 176-2230 ? [facility]ATS: Transportation to medical appointments. 607-325-8072 ? Social Security Administration: May apply for disability if have a Stage IV cancer. 818-776-3695 5107668823 ? [employer], Disability and Transit Services: Assists with nutrition, care and transit needs. 647-885-1372

## 2017-08-21 LAB — CANCER ANTIGEN 15-3: CAN 15 3: 24.9 U/mL (ref 0.0–25.0)

## 2017-08-21 LAB — CANCER ANTIGEN 27.29: CAN 27.29: 25.5 U/mL (ref 0.0–38.6)

## 2017-08-24 ENCOUNTER — Encounter (HOSPITAL_COMMUNITY): Payer: Self-pay | Admitting: Adult Health

## 2017-08-24 ENCOUNTER — Telehealth (HOSPITAL_COMMUNITY): Payer: Self-pay | Admitting: Emergency Medicine

## 2017-08-24 NOTE — Telephone Encounter (Signed)
Pt states that she started to have nerve pain again in her left foot since Saturday.  She has been working with her foot, icing and applying heat.  There is just a little in her nail beds.  She will call me by Thursday to let me know how the neuropathy is doing.

## 2017-08-26 ENCOUNTER — Telehealth (HOSPITAL_COMMUNITY): Payer: Self-pay | Admitting: Emergency Medicine

## 2017-09-01 NOTE — Telephone Encounter (Signed)
I really think it would be helpful for her to try gabapentin for her symptoms.  We can consider further dose-reducing her chemo at her next visit, but I think the nerve pain can best be managed with a trial of gabapentin.  If she is willing to try it, then let me know and we can send in a very low dose with dose-escalation schedule for her of the medication (100 mg on day 1 once daily, then 100 mg BID on day 2, then 100 mg TID thereafter).  Encourage her that it's generally well-tolerated and may improve her symptoms.      Mike Craze, NP [place] [phone]

## 2017-09-02 ENCOUNTER — Other Ambulatory Visit (HOSPITAL_COMMUNITY): Payer: Self-pay | Admitting: Emergency Medicine

## 2017-09-02 DIAGNOSIS — C50811 Malignant neoplasm of overlapping sites of right female breast: Secondary | ICD-10-CM

## 2017-09-02 DIAGNOSIS — Z170 Estrogen receptor positive status [ER+]: Principal | ICD-10-CM

## 2017-09-02 MED ORDER — GABAPENTIN 100 MG PO CAPS: ORAL_CAPSULE | ORAL | 0 refills | Status: DC

## 2017-09-02 MED ORDER — DEXAMETHASONE 4 MG PO TABS: 8.0000 mg | ORAL_TABLET | Freq: Two times a day (BID) | ORAL | 0 refills | Status: DC

## 2017-09-02 NOTE — Telephone Encounter (Signed)
Spoke with Angel Hale.  At first patient was very resistant to try gabapentin.  The more we talked about everything she was willing to try the medication.  She is going to take it at least until she comes back to see [place] next week.  She is only going to take one every evening (that's the best I could do).  I did call in her a prescription for the gabapentin.  I did explain this would hopefully help with her nerve pain and we could re-evaluate the issue when she came back to see [place] Thursday. She is still having neuropathy in her foot and fingertips.  She did go to therapy this week.

## 2017-09-10 ENCOUNTER — Inpatient Hospital Stay (HOSPITAL_COMMUNITY): Payer: Self-pay | Attending: Internal Medicine

## 2017-09-10 ENCOUNTER — Inpatient Hospital Stay (HOSPITAL_BASED_OUTPATIENT_CLINIC_OR_DEPARTMENT_OTHER): Payer: Self-pay | Admitting: Adult Health

## 2017-09-10 ENCOUNTER — Encounter (HOSPITAL_COMMUNITY): Payer: Self-pay | Admitting: Internal Medicine

## 2017-09-10 ENCOUNTER — Encounter (HOSPITAL_COMMUNITY): Payer: Self-pay | Admitting: Adult Health

## 2017-09-10 ENCOUNTER — Encounter (HOSPITAL_COMMUNITY): Payer: Self-pay

## 2017-09-10 VITALS — BP 97/56 | HR 90 | Temp 98.6°F | Resp 18 | Wt 130.6 lb

## 2017-09-10 DIAGNOSIS — Z5112 Encounter for antineoplastic immunotherapy: Secondary | ICD-10-CM | POA: Insufficient documentation

## 2017-09-10 DIAGNOSIS — Z923 Personal history of irradiation: Secondary | ICD-10-CM | POA: Insufficient documentation

## 2017-09-10 DIAGNOSIS — C773 Secondary and unspecified malignant neoplasm of axilla and upper limb lymph nodes: Secondary | ICD-10-CM

## 2017-09-10 DIAGNOSIS — Z87891 Personal history of nicotine dependence: Secondary | ICD-10-CM

## 2017-09-10 DIAGNOSIS — E876 Hypokalemia: Secondary | ICD-10-CM

## 2017-09-10 DIAGNOSIS — C50811 Malignant neoplasm of overlapping sites of right female breast: Secondary | ICD-10-CM

## 2017-09-10 DIAGNOSIS — R5383 Other fatigue: Secondary | ICD-10-CM | POA: Insufficient documentation

## 2017-09-10 DIAGNOSIS — C50911 Malignant neoplasm of unspecified site of right female breast: Secondary | ICD-10-CM

## 2017-09-10 DIAGNOSIS — G620 Drug-induced polyneuropathy: Secondary | ICD-10-CM

## 2017-09-10 DIAGNOSIS — Z79899 Other long term (current) drug therapy: Secondary | ICD-10-CM

## 2017-09-10 DIAGNOSIS — Z170 Estrogen receptor positive status [ER+]: Secondary | ICD-10-CM | POA: Insufficient documentation

## 2017-09-10 DIAGNOSIS — T451X5S Adverse effect of antineoplastic and immunosuppressive drugs, sequela: Secondary | ICD-10-CM | POA: Insufficient documentation

## 2017-09-10 DIAGNOSIS — Z7689 Persons encountering health services in other specified circumstances: Secondary | ICD-10-CM | POA: Insufficient documentation

## 2017-09-10 DIAGNOSIS — Z5111 Encounter for antineoplastic chemotherapy: Secondary | ICD-10-CM

## 2017-09-10 DIAGNOSIS — Z880 Allergy status to penicillin: Secondary | ICD-10-CM | POA: Insufficient documentation

## 2017-09-10 LAB — CBC WITH DIFFERENTIAL/PLATELET
Basophils Absolute: 0 10*3/uL (ref 0.0–0.1)
Basophils Relative: 0 %
EOS PCT: 0 %
Eosinophils Absolute: 0 10*3/uL (ref 0.0–0.7)
HCT: 34.2 % — ABNORMAL LOW (ref 36.0–46.0)
Hemoglobin: 11.4 g/dL — ABNORMAL LOW (ref 12.0–15.0)
LYMPHS ABS: 1.5 10*3/uL (ref 0.7–4.0)
LYMPHS PCT: 14 %
MCH: 33.6 pg (ref 26.0–34.0)
MCHC: 33.3 g/dL (ref 30.0–36.0)
MCV: 100.9 fL — AB (ref 78.0–100.0)
Monocytes Absolute: 1.1 10*3/uL — ABNORMAL HIGH (ref 0.1–1.0)
Monocytes Relative: 10 %
Neutro Abs: 7.7 10*3/uL (ref 1.7–7.7)
Neutrophils Relative %: 76 %
PLATELETS: 331 10*3/uL (ref 150–400)
RBC: 3.39 MIL/uL — AB (ref 3.87–5.11)
RDW: 15.4 % (ref 11.5–15.5)
WBC: 10.3 10*3/uL (ref 4.0–10.5)

## 2017-09-10 LAB — COMPREHENSIVE METABOLIC PANEL
ALT: 28 U/L (ref 14–54)
AST: 25 U/L (ref 15–41)
Albumin: 3.8 g/dL (ref 3.5–5.0)
Alkaline Phosphatase: 86 U/L (ref 38–126)
Anion gap: 8 (ref 5–15)
BUN: 14 mg/dL (ref 6–20)
CO2: 27 mmol/L (ref 22–32)
Calcium: 9.4 mg/dL (ref 8.9–10.3)
Chloride: 103 mmol/L (ref 101–111)
Creatinine, Ser: 0.57 mg/dL (ref 0.44–1.00)
GFR calc Af Amer: >60 mL/min (ref >60)
GFR calc non Af Amer: >60 mL/min (ref >60)
GLUCOSE: 94 mg/dL (ref 65–99)
POTASSIUM: 3.4 mmol/L — AB (ref 3.5–5.1)
Sodium: 138 mmol/L (ref 135–145)
Total Bilirubin: 0.6 mg/dL (ref 0.3–1.2)
Total Protein: 7 g/dL (ref 6.5–8.1)

## 2017-09-10 MED ORDER — SODIUM CHLORIDE 0.9 % IV SOLN: 10.0000 mg | Freq: Once | INTRAVENOUS | Status: DC

## 2017-09-10 MED ORDER — POTASSIUM CHLORIDE CRYS ER 20 MEQ PO TBCR
40.0000 meq | EXTENDED_RELEASE_TABLET | Freq: Once | ORAL | Status: AC
  Administered 2017-09-10: 40 meq via ORAL
  Filled 2017-09-10: qty 2

## 2017-09-10 MED ORDER — PEGFILGRASTIM 6 MG/0.6ML ~~LOC~~ PSKT
6.0000 mg | PREFILLED_SYRINGE | Freq: Once | SUBCUTANEOUS | Status: AC
  Administered 2017-09-10: 6 mg via SUBCUTANEOUS
  Filled 2017-09-10: qty 0.6

## 2017-09-10 MED ORDER — ACETAMINOPHEN 325 MG PO TABS
650.0000 mg | ORAL_TABLET | Freq: Once | ORAL | Status: AC
  Administered 2017-09-10: 650 mg via ORAL
  Filled 2017-09-10: qty 2

## 2017-09-10 MED ORDER — DEXAMETHASONE SODIUM PHOSPHATE 10 MG/ML IJ SOLN
10.0000 mg | Freq: Once | INTRAMUSCULAR | Status: AC
  Administered 2017-09-10: 10 mg via INTRAVENOUS
  Filled 2017-09-10: qty 1

## 2017-09-10 MED ORDER — SODIUM CHLORIDE 0.9% FLUSH
10.0000 mL | INTRAVENOUS | Status: DC | PRN
Start: 2017-09-10 — End: 2017-09-10
  Administered 2017-09-10: 10 mL
  Filled 2017-09-10: qty 10

## 2017-09-10 MED ORDER — PERTUZUMAB CHEMO INJECTION 420 MG/14ML
420.0000 mg | Freq: Once | INTRAVENOUS | Status: AC
  Administered 2017-09-10: 420 mg via INTRAVENOUS
  Filled 2017-09-10: qty 14

## 2017-09-10 MED ORDER — DOCETAXEL CHEMO INJECTION 160 MG/16ML
47.6000 mg/m2 | Freq: Once | INTRAVENOUS | Status: AC
  Administered 2017-09-10: 80 mg via INTRAVENOUS
  Filled 2017-09-10: qty 8

## 2017-09-10 MED ORDER — TRASTUZUMAB CHEMO 150 MG IV SOLR
6.0000 mg/kg | Freq: Once | INTRAVENOUS | Status: AC
  Administered 2017-09-10: 336 mg via INTRAVENOUS
  Filled 2017-09-10: qty 16

## 2017-09-10 MED ORDER — PALONOSETRON HCL INJECTION 0.25 MG/5ML
0.2500 mg | Freq: Once | INTRAVENOUS | Status: AC
  Administered 2017-09-10: 0.25 mg via INTRAVENOUS
  Filled 2017-09-10: qty 5

## 2017-09-10 MED ORDER — EPOETIN ALFA 10000 UNIT/ML IJ SOLN
INTRAMUSCULAR | Status: AC
  Filled 2017-09-10: qty 1

## 2017-09-10 MED ORDER — HEPARIN SOD (PORK) LOCK FLUSH 100 UNIT/ML IV SOLN
500.0000 [IU] | Freq: Once | INTRAVENOUS | Status: AC | PRN
  Administered 2017-09-10: 500 [IU]
  Filled 2017-09-10: qty 5

## 2017-09-10 MED ORDER — HEPARIN SOD (PORK) LOCK FLUSH 100 UNIT/ML IV SOLN
INTRAVENOUS | Status: AC
  Filled 2017-09-10: qty 5

## 2017-09-10 MED ORDER — SODIUM CHLORIDE 0.9 % IV SOLN
600.0000 mg | Freq: Once | INTRAVENOUS | Status: AC
  Administered 2017-09-10: 600 mg via INTRAVENOUS
  Filled 2017-09-10: qty 60

## 2017-09-10 MED ORDER — DIPHENHYDRAMINE HCL 25 MG PO CAPS
50.0000 mg | ORAL_CAPSULE | Freq: Once | ORAL | Status: AC
  Administered 2017-09-10: 50 mg via ORAL
  Filled 2017-09-10: qty 2

## 2017-09-10 MED ORDER — SODIUM CHLORIDE 0.9 % IV SOLN
Freq: Once | INTRAVENOUS | Status: AC
  Administered 2017-09-10: 10:00:00 via INTRAVENOUS

## 2017-09-10 NOTE — Progress Notes (Signed)
Angel Hale tolerated chemo tx and Neulasta on-pro well without complaints or incident. Labs reviewed with and pt seen by Mike Craze NP prior to administering chemotherapy. Neulasta on-pro applied to pt's right arm with green indicator flashing. VSS upon discharge. Pt discharged self ambulatory in satisfactory condition accompanied by her husband

## 2017-09-10 NOTE — Patient Instructions (Signed)
[facility] Discharge Instructions for Patients Receiving Chemotherapy   Beginning January 23rd 2017 lab work for the Cancer Center will be done in the  Main lab at [place] on 1st floor. If you have a lab appointment with the Cancer Center please come in thru the  Main Entrance and check in at the main information desk   Today you received the following chemotherapy agents Taxotere,Carboplatin, Herceptin and Perjeta as well as Neulasta on-pro. Follow-up as scheduled. Call clinic for any questions or concerns  To help prevent nausea and vomiting after your treatment, we encourage you to take your nausea medication   If you develop nausea and vomiting, or diarrhea that is not controlled by your medication, call the clinic.  The clinic phone number is [phone]. Office hours are Monday-Friday 8:30am-5:00pm.  BELOW ARE SYMPTOMS THAT SHOULD BE REPORTED IMMEDIATELY:  *FEVER GREATER THAN 101.0 F  *CHILLS WITH OR WITHOUT FEVER  NAUSEA AND VOMITING THAT IS NOT CONTROLLED WITH YOUR NAUSEA MEDICATION  *UNUSUAL SHORTNESS OF BREATH  *UNUSUAL BRUISING OR BLEEDING  TENDERNESS IN MOUTH AND THROAT WITH OR WITHOUT PRESENCE OF ULCERS  *URINARY PROBLEMS  *BOWEL PROBLEMS  UNUSUAL RASH Items with * indicate a potential emergency and should be followed up as soon as possible. If you have an emergency after office hours please contact your primary care physician or go to the nearest emergency department.  Please call the clinic during office hours if you have any questions or concerns.   You may also contact the Patient Navigator at [phone] should you have any questions or need assistance in obtaining follow up care.      Resources For Cancer Patients and their Caregivers ? American Cancer Society: Can assist with transportation, wigs, general needs, runs Look Good Feel Better.        1-888-227-6333 ? Cancer Care: Provides financial assistance, online support  groups, medication/co-pay assistance.  1-800-813-HOPE (4673) ? [facility] Assists [place] cancer patients and their families through emotional , educational and financial support.  336-427-4357 ? [place] DSS Where to apply for food stamps, Medicaid and utility assistance. 336-342-1394 ? [facility]: Transportation to medical appointments. 336-347-2287 ? Social Security Administration: May apply for disability if have a Stage IV cancer. 336-342-7796 1-800-772-1213 ? [place] Aging, Disability and Transit Services: Assists with nutrition, care and transit needs. 336-349-2343         

## 2017-09-10 NOTE — Progress Notes (Signed)
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 Ki67 80%.        05/12/2017 Procedure    Additional (R) breast biopsy (UOQ): IDC, grade 3; ER+ (80%), PR+ (50%), HER2+ (ratio 2.77), Ki67 90%.       05/12/2017 Procedure    (R) axillary lymph node biopsy: Metastatic carcinoma; ER+ (80%), PR+ (30%), HER2+ (ratio 2.99), Ki67 80%.       05/12/2017 Imaging    Mammogram/breast US: Targeted ultrasound is performed, showing multiple irregular hypoechoic masses. In the 6 o'clock region of the right breast is a large irregular mass spans at least 4.8 cm. In the 4:00 to 5:00 region of the right breast an irregular mass measures 4.5 cm. In the 930-10 o'clock position of the right breast centered 9-10 cm from the nipple is an irregular 4.0 x 3.8 x 1.9 cm hypoechoic mass. In the 9:30 position of the right breast at the lateral margin is a probable abnormal intramammary lymph node measuring 1.3 cm greatest diameter. In the 10 o'clock position of the right breast far lateral is a hypoechoic dermal nodule that measures 0.5 cm. Diffuse skin thickening of the right breast measures up to 4.5 mm.      05/14/2017 Tumor Marker      Ref. Range 05/14/2017 15:30  CA 15-3 Latest Ref Range: 0.0 - 25.0 U/mL 115.9 (H)     Ref. Range 05/14/2017 15:30  CA 27.29 Latest Ref Range: 0.0 - 38.6 U/mL 157.7 (H)        05/15/2017 Imaging    CT chest/abd/pelvis:  IMPRESSION: 1. Numerous right breast  masses and metastatic right axillary lymphadenopathy. Findings suspicious for chest wall invasion along the lower lateral aspect of the breast. 2. Two small indeterminate right lower lobe pulmonary nodules will require surveillance. 3. No findings for metastatic disease involving the abdomen/pelvis or osseous structures.      05/15/2017 Imaging    Bone scan:  IMPRESSION: No evidence of metastatic disease.      05/18/2017 Echocardiogram    LV EF: 60% -   65%      05/20/2017 Procedure    Port-a-cath placement       05/25/2017 Initial Diagnosis    Malignant neoplasm of overlapping sites of right breast in female, estrogen receptor positive ([place])      05/27/2017 -  Neo-Adjuvant Chemotherapy    TCHP        INTERVAL HISTORY:  Ms. 47 y.o. returns for consideration of next cycle of therapy for right breast cancer.   Here today with her husband.   Overall, she tells me she has been feeling well.  With cycle #5, we dose reduce her Taxotere by 25% for progressive peripheral neuropathy.  She called our navigator several days after cycle #5, reporting continued neuropathic pain.  Prescription for progressive dose of gabapentin 100 mg E scribed to her pharmacy (plan to take 100 mg on day 1, 100 mg twice daily on day 2, 100 mg 3 times daily on day 3 and thereafter).  Low-dose gabapentin was chosen given patient's concerns for medication side effects.  She tells me she had the gabapentin filled, but did not try any of the medication as her neuropathy improved on its own.  She states that about 3-4 days after her treatment, she has pain at the nailbeds and tingling sensation to her toes.  The symptoms then improve over the next weeks before she comes back for her next cycle of treatment.  She has some redness and worsening pain at the site of her previous left ankle surgery, which also improves several days after chemotherapy.  There is no associated wound or open skin in this area.  She wants to  know if it would be possible to further dose reduce the Taxotere for cycle #6 today.  She has had no recurrent vasovagal episodes that she has had in the past; denies any syncope or dizziness.  She tries to maintain her hydration by drinking plenty of fluids.  Her bowels are moving well.  Her appetite is good.  She is able to perform ADLs independently.  Overall, she tells me she feels quite well and ready for her next cycle of therapy today.   Therapy  Dates Cycle #  TCHP 05/27/17-present Cycle #6 due today     REVIEW OF SYSTEMS:  Review of Systems  Constitutional: Positive for fatigue. Negative for chills and fever.  HENT:  Negative.   Eyes: Negative.   Respiratory: Negative.  Negative for cough and shortness of breath.   Cardiovascular: Negative.   Gastrointestinal: Negative.   Endocrine: Negative.   Genitourinary: Negative.    Musculoskeletal: Negative.   Skin: Negative.   Neurological: Positive for numbness.  Hematological: Negative.   Psychiatric/Behavioral: Negative.      PAST MEDICAL/SURGICAL HISTORY:  Past Medical History:  Diagnosis Date  . Cancer [facility])    breast cancer   Past Surgical History:  Procedure Laterality Date  . BUNIONECTOMY Left 2000  . EXTERNAL FIXATION LEG Left 02/16/2017   Procedure: EXTERNAL FIXATION LEG;  Surgeon: Mcarthur Rossetti, MD;  Location: [place];  Service: Orthopedics;  Laterality: Left;  . HARDWARE REMOVAL Left 05/21/2017   Procedure: REMOVAL OF RETAINED PROMINENT SCREW LEFT ANKLE;  Surgeon: Mcarthur Rossetti, MD;  Location: [place];  Service: Orthopedics;  Laterality: Left;  . ORIF ANKLE FRACTURE Left 03/03/2017  . ORIF ANKLE FRACTURE Left 03/03/2017   Procedure: OPEN REDUCTION INTERNAL FIXATION (ORIF) LEFT PILON FRACTURE AND LEFT LATERAL MALLEOLUS;  Surgeon: Mcarthur Rossetti, MD;  Location: [place];  Service: Orthopedics;  Laterality: Left;  . OVARIAN CYST REMOVAL Left 1991  . PORTACATH PLACEMENT    . PORTACATH PLACEMENT  Left 05/20/2017   Procedure: INSERTION PORT-A-CATH;  Surgeon: Virl Cagey, MD;  Location: [facility];  Service: General;  Laterality: Left;  . WISDOM TOOTH EXTRACTION       SOCIAL HISTORY:  Social History   Socioeconomic History  . Marital status: Married    Spouse name: Not on file  . Number of children: Not on file  . Years of education: Not on file  . Highest education level: Not on file  Social Needs  . Financial resource strain: Not on file  . Food insecurity - worry: Not on file  . Food insecurity - inability: Not on file  . Transportation needs - medical: Not on file  . Transportation needs - non-medical: Not on file  Occupational History  . Not on file  Tobacco Use  . Smoking status: Former  Smoker    Packs/day: 1.00    Years: 10.00    Pack years: 10.00    Types: Cigarettes    Last attempt to quit: 02/16/2017    Years since quitting: 0.5  . Smokeless tobacco: Never Used  Substance and Sexual Activity  . Alcohol use: Yes    Comment: occasional  . Drug use: No  . Sexual activity: Not Currently    Birth control/protection: None  Other Topics Concern  . Not on file  Social History Narrative  . Not on file    FAMILY HISTORY:  Family History  Problem Relation Age of Onset  . Colon cancer Father     CURRENT MEDICATIONS:  Outpatient Encounter Medications as of 09/10/2017  Medication Sig  . acetaminophen (TYLENOL) 500 MG tablet Take 1,000 mg by mouth every 6 (six) hours as needed (for pain.).  Marland Kitchen CARBOPLATIN IV Inject into the vein. Every 3 weeks  . Coenzyme Q10 (CO Q-10 PO) Take 1 tablet by mouth daily.  . Cyanocobalamin (B-12 PO) Take 1 tablet by mouth daily.  Marland Kitchen dexamethasone (DECADRON) 4 MG tablet Take 2 tablets (8 mg total) by mouth 2 (two) times daily. Start the day before Taxotere. Then again the day after chemo for 3 days.  . DOCEtaxel (TAXOTERE IV) Inject into the vein. Every 3 weeks  . gabapentin (NEURONTIN) 100 MG capsule Take 1 capsule daily on first  day, then 2 times a day second day, then 3 times a day from there out.  . Homeopathic Products (FRANKINCENSE UPLIFTING) OIL Take 1 capsule by mouth daily as needed (for home remedy).  Marland Kitchen HYDROcodone-acetaminophen (NORCO) 5-325 MG tablet Take 1 tablet by mouth every 4 (four) hours as needed for moderate pain.  Marland Kitchen LAVENDER OIL PO Take 1 capsule by mouth daily as needed (for home remedy).  Marland Kitchen lidocaine-prilocaine (EMLA) cream Apply to affected area once  . LORazepam (ATIVAN) 0.5 MG tablet Take 1 tablet (0.5 mg total) by mouth every 6 (six) hours as needed (Nausea or vomiting).  . methocarbamol (ROBAXIN) 500 MG tablet Take 1 tablet (500 mg total) by mouth every 6 (six) hours as needed for muscle spasms.  . Multiple Vitamins-Minerals (ZINC PO) Take 1 capsule by mouth daily.  . ondansetron (ZOFRAN) 8 MG tablet Take 1 tablet (8 mg total) by mouth 2 (two) times daily as needed for refractory nausea / vomiting. Start on day 3 after chemo.  Marland Kitchen Pegfilgrastim (NEULASTA ONPRO [place]) Inject into the skin. Every 3 weeks  . Pertuzumab (PERJETA IV) Inject into the vein. Every 3 weeks  . prochlorperazine (COMPAZINE) 10 MG tablet Take 1 tablet (10 mg total) by mouth every 6 (six) hours as needed (Nausea or vomiting).  . Trastuzumab (HERCEPTIN IV) Inject into the vein. Every 3 weeks  . vitamin C (ASCORBIC ACID) 500 MG tablet Take 1 tablet (500 mg total) by mouth daily.   Facility-Administered Encounter Medications as of 09/10/2017  Medication  . potassium chloride SA (K-DUR,KLOR-CON) CR tablet 40 mEq    ALLERGIES:  Allergies  Allergen Reactions  . Penicillins Nausea And Vomiting    Has patient had a PCN reaction causing immediate rash, facial/tongue/throat swelling, SOB or lightheadedness with hypotension: No Has patient had a PCN reaction causing severe rash involving mucus membranes or skin necrosis: No Has patient had a PCN reaction that required hospitalization: No Has patient had a PCN reaction occurring within  the last 10 years: No If all of the above answers are "NO", then may proceed  with Cephalosporin use.      PHYSICAL EXAM:  ECOG Performance status: 1 - Mildly symptomatic; remains independent      Physical Exam  Constitutional: She is oriented to person, place, and time and well-developed, well-nourished, and in no distress.  Seen in chemo bed in infusion area   HENT:  Head: Normocephalic.  Mouth/Throat: Oropharynx is clear and moist.  Eyes: Conjunctivae are normal. No scleral icterus.  Neck: Normal range of motion. Neck supple.  Cardiovascular: Normal rate and regular rhythm.  Pulmonary/Chest: Effort normal and breath sounds normal. No respiratory distress.  Abdominal: Soft. Bowel sounds are normal. There is no tenderness.  Musculoskeletal: Normal range of motion. She exhibits no edema.  Left ankle with healed scar (h/o surgery); no open wound  Lymphadenopathy:    She has no cervical adenopathy.       Right: No supraclavicular adenopathy present.       Left: No supraclavicular adenopathy present.  Neurological: She is alert and oriented to person, place, and time. No cranial nerve deficit.  Skin: Skin is warm and dry. No rash noted.  Nail beds darkened on bilateral hands (as expected with chemo)   Psychiatric: Mood, memory, affect and judgment normal.  Nursing note and vitals reviewed.    LABORATORY DATA:  I have reviewed the labs as listed.  CBC    Component Value Date/Time   WBC 10.3 09/10/2017 0904   RBC 3.39 (L) 09/10/2017 0904   HGB 11.4 (L) 09/10/2017 0904   HCT 34.2 (L) 09/10/2017 0904   PLT 331 09/10/2017 0904   MCV 100.9 (H) 09/10/2017 0904   MCH 33.6 09/10/2017 0904   MCHC 33.3 09/10/2017 0904   RDW 15.4 09/10/2017 0904   LYMPHSABS 1.5 09/10/2017 0904   MONOABS 1.1 (H) 09/10/2017 0904   EOSABS 0.0 09/10/2017 0904   BASOSABS 0.0 09/10/2017 0904   CMP Latest Ref Rng & Units 09/10/2017 08/20/2017 07/29/2017  Glucose 65 - 99 mg/dL 94 92 93  BUN 6 - 20  mg/dL '14 11 11  ' Creatinine 0.44 - 1.00 mg/dL 0.57 0.60 0.57  Sodium 135 - 145 mmol/L 138 138 137  Potassium 3.5 - 5.1 mmol/L 3.4(L) 3.6 3.7  Chloride 101 - 111 mmol/L 103 104 103  CO2 22 - 32 mmol/L '27 25 26  ' Calcium 8.9 - 10.3 mg/dL 9.4 9.4 9.6  Total Protein 6.5 - 8.1 g/dL 7.0 6.9 7.2  Total Bilirubin 0.3 - 1.2 mg/dL 0.6 0.4 0.5  Alkaline Phos 38 - 126 U/L 86 90 83  AST 15 - 41 U/L '25 31 25  ' ALT 14 - 54 U/L 28 34 30     DIAGNOSTIC IMAGING:  Last restaging scans Brief summary of findings  MRI breasts: 07/21/17       ASSESSMENT & PLAN:    Stage IIIB ([id number]) invasive ductal carcinoma of (R) breast; ER+/PR+/HER2+: -Diagnosed in 04/2017. Staging imaging with CT chest/abd/pelvis and bone scan were negative for distant metastatic or osseous metastatic disease.  Currently undergoing neoadjuvant chemo with Taxotere/Carbo/Herceptin/Perjeta.  -MRI breasts on 07/21/17 after 3 cycles of therapy revealed excellent response to therapy thus far; no significant residual enhancement in the (R) breast; there is abnormal tissue extending from the skin surface to underlying pectoralis muscle with loss of fat plane, which was difficult for radiology to determine if muscle itself had been invaded by tumor; prominent irregular (R) axillary LN.  -Scheduled to see Dr. Constance Haw with general surgery later this month to discuss breast surgery. We discussed that  she will potentially need radiation therapy after surgery, continue Herceptin/Perjeta for total of 1 year of anti-HER2 therapy (total of 17 cycles), as well as anti-estrogen oral therapy for 5-10 years.   -Return to cancer center in 3 weeks for Herceptin/Perjeta. If her surgery date coincides with her treatment date, then we can move her treatment as needed.    Encounter for antineoplastic therapy:  -Labs reviewed today and are adequate for treatment.   Proceed with cycle #6 TCHP with Neulasta OnPro support as scheduled today.  -Current clinical  concerns d/t treatment include:   *Peripheral neuropathy, currently grade 1: Her symptoms do not interfere with her daily life and she performs ADLs independently. She is concerned about permanent nerve damage. Shared with her that, in general, when symptoms improve on their own are less likely to be permanent. Discussed with Dr. Sherrine Maples; will further dose-reduce her Taxotere by an additional 15% for today's cycle based on pt's young age and her concerns for permanent nerve damage.   *Mild hypokalemia: K 3.4 today. Will replete with K-Dur 40 mEq po once during treatment today.   *Potential for cardiotoxicity with anti-HER2 agents: Last ECHO 08/05/17 showed normal EF 60-65%. Next ECHO will be due in 10/2017; will place orders at subsequent follow-up visit.       Dispo:  -Follow-up with Dr. Constance Haw with surgery as scheduled (already scheduled for 09/22/17) -Return to cancer center (tentatively) in 3 weeks for Herceptin/Perjeta; if her surgery date coincides with her treatment date, then we can move her treatment as needed.     All questions were answered to patient's stated satisfaction. Encouraged patient to call with any new concerns or questions before her next visit to the cancer center and we can certain see her sooner, if needed.     Plan of care discussed with Dr. Sherrine Maples, who agrees with the above aforementioned.    Orders placed this encounter:  No orders of the defined types were placed in this encounter.     Mike Craze, NP [place] [phone]

## 2017-09-22 ENCOUNTER — Other Ambulatory Visit (HOSPITAL_COMMUNITY): Payer: Self-pay | Admitting: Adult Health

## 2017-09-22 ENCOUNTER — Encounter: Payer: Self-pay | Admitting: General Surgery

## 2017-09-22 ENCOUNTER — Encounter (HOSPITAL_COMMUNITY): Payer: Self-pay | Admitting: Adult Health

## 2017-09-22 ENCOUNTER — Ambulatory Visit (INDEPENDENT_AMBULATORY_CARE_PROVIDER_SITE_OTHER): Payer: Self-pay | Admitting: General Surgery

## 2017-09-22 VITALS — BP 122/68 | HR 91 | Temp 98.9°F | Resp 18 | Ht 65.0 in | Wt 130.0 lb

## 2017-09-22 DIAGNOSIS — C50811 Malignant neoplasm of overlapping sites of right female breast: Secondary | ICD-10-CM

## 2017-09-22 DIAGNOSIS — Z170 Estrogen receptor positive status [ER+]: Secondary | ICD-10-CM

## 2017-09-22 NOTE — Progress Notes (Addendum)
[facility] History and Physical  Reason for Referral: Metastatic Right Breast Cancer- Stage IIIB ([id number]) with overlapping multicentric disease  Referring Physician: Mike Craze NP  Chief Complaint    Breast Cancer      Angel Hale is a 47 y.o. female.  HPI: Angel. Hale is a very pleasant 47 yo who I had the pleasure of meeting several months ago. At that time she presented with an ulceration on the inframammary fold of the right breast that had been present for over 2-3 years.  She had attributed this to a trauma, and had never had any mammograms.  She noticed that the area started to enlarge, and she also noted masses in her axilla.  Unfortunately as suspected she was diagnose her with a neglected metastatic breast cancer in the right breast that was multicentric and was associated with positive axillary nodes and the  ulcerated lesion in her inframammary fold with peau de orange of the skin.  Clinically she had a [id number] cancer that was PR/ER/HER2+, and she has completed chemotherapy. She underwent an MRI during the midpoint of her chemotherapy and was found to have no residual breast disease, an area in the left lower quadrant and pectoralis muscle that could not be excluded to have cancer invasion, and prominent lymph nodes in the axilla.  She has now completed her entire course of chemotherapy with TCHP, and has tolerated this well. 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 She is here today to discuss the options moving forward with regards to surgery.   Past Medical History:  Diagnosis Date  . Cancer [facility])    breast cancer    Past Surgical History:  Procedure Laterality Date  . BUNIONECTOMY Left 2000  . EXTERNAL FIXATION LEG Left 02/16/2017   Procedure:  EXTERNAL FIXATION LEG;  Surgeon: Mcarthur Rossetti, MD;  Location: [place];  Service: Orthopedics;  Laterality: Left;  . HARDWARE REMOVAL Left 05/21/2017   Procedure: REMOVAL OF RETAINED PROMINENT SCREW LEFT ANKLE;  Surgeon: Mcarthur Rossetti, MD;  Location: [place];  Service: Orthopedics;  Laterality: Left;  . ORIF ANKLE FRACTURE Left 03/03/2017  . ORIF ANKLE FRACTURE Left 03/03/2017   Procedure: OPEN REDUCTION INTERNAL FIXATION (ORIF) LEFT PILON FRACTURE AND LEFT LATERAL MALLEOLUS;  Surgeon: Mcarthur Rossetti, MD;  Location: [place];  Service: Orthopedics;  Laterality: Left;  . OVARIAN CYST REMOVAL Left 1991  . PORTACATH PLACEMENT    . PORTACATH PLACEMENT Left 05/20/2017   Procedure: INSERTION PORT-A-CATH;  Surgeon: Virl Cagey, MD;  Location: [facility];  Service: General;  Laterality: Left;  . WISDOM TOOTH EXTRACTION     NO family history of breast cancer Family History  Problem Relation Age of Onset  . Colon cancer Father     Social History   Tobacco Use  . Smoking status: Former Smoker    Packs/day: 1.00    Years: 10.00    Pack years: 10.00    Types: Cigarettes    Last attempt to quit: 02/16/2017    Years since quitting: 0.5  . Smokeless tobacco: Never Used  Substance Use Topics  . Alcohol use: Yes    Comment: occasional  . Drug use: No    Medications: I have reviewed the patient's current medications. Allergies as of 09/22/2017  Reactions   Penicillins Nausea And Vomiting   Has patient had a PCN reaction causing immediate rash, facial/tongue/throat swelling, SOB or lightheadedness with hypotension: No Has patient had a PCN reaction causing severe rash involving mucus membranes or skin necrosis: No Has patient had a PCN reaction that required hospitalization: No Has patient had a PCN reaction occurring within the last 10 years: No If all of the above answers are "NO", then may proceed with Cephalosporin use.      Medication List        Accurate as  of 09/22/17  2:25 PM. Always use your most recent med list.          acetaminophen 500 MG tablet Commonly known as:  TYLENOL Take 1,000 mg by mouth every 6 (six) hours as needed (for pain.).   B-12 PO Take 1 tablet by mouth daily.   CARBOPLATIN IV Inject into the vein. Every 3 weeks   CO Q-10 PO Take 1 tablet by mouth daily.   dexamethasone 4 MG tablet Commonly known as:  DECADRON Take 2 tablets (8 mg total) by mouth 2 (two) times daily. Start the day before Taxotere. Then again the day after chemo for 3 days.   FRANKINCENSE UPLIFTING Oil Take 1 capsule by mouth daily as needed (for home remedy).   gabapentin 100 MG capsule Commonly known as:  NEURONTIN Take 1 capsule daily on first day, then 2 times a day second day, then 3 times a day from there out.   HERCEPTIN IV Inject into the vein. Every 3 weeks   HYDROcodone-acetaminophen 5-325 MG tablet Commonly known as:  NORCO Take 1 tablet by mouth every 4 (four) hours as needed for moderate pain.   LAVENDER OIL PO Take 1 capsule by mouth daily as needed (for home remedy).   lidocaine-prilocaine cream Commonly known as:  EMLA Apply to affected area once   LORazepam 0.5 MG tablet Commonly known as:  ATIVAN Take 1 tablet (0.5 mg total) by mouth every 6 (six) hours as needed (Nausea or vomiting).   methocarbamol 500 MG tablet Commonly known as:  ROBAXIN Take 1 tablet (500 mg total) by mouth every 6 (six) hours as needed for muscle spasms.   NEULASTA ONPRO [place] Inject into the skin. Every 3 weeks   ondansetron 8 MG tablet Commonly known as:  ZOFRAN Take 1 tablet (8 mg total) by mouth 2 (two) times daily as needed for refractory nausea / vomiting. Start on day 3 after chemo.   PERJETA IV Inject into the vein. Every 3 weeks   prochlorperazine 10 MG tablet Commonly known as:  COMPAZINE Take 1 tablet (10 mg total) by mouth every 6 (six) hours as needed (Nausea or vomiting).   TAXOTERE IV Inject into the vein. Every 3  weeks   vitamin C 500 MG tablet Commonly known as:  ASCORBIC ACID Take 1 tablet (500 mg total) by mouth daily.   ZINC PO Take 1 capsule by mouth daily.        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 rate 18, height '5\' 5"'  (1.651 m), weight 130 lb (59 kg). Physical Exam  Constitutional: She is oriented to person, place, and time and well-developed, well-nourished, and in no distress.  HENT:  Head: Normocephalic.  Eyes: Pupils are equal, round,  and reactive to light.  Neck: Normal range of motion.  Cardiovascular: Normal rate and regular rhythm.  Pulmonary/Chest: Effort normal.  Left sided port in place, right breast without palpable masses, no nipple discharge or inversion, no axillary adenopathy, erythematous, tight skin at the lower outer inframammary fold  Abdominal: Soft. She exhibits no distension. There is no tenderness.  Musculoskeletal: Normal range of motion.  Left leg mild edema, mild erythema  Lymphadenopathy:    She has no cervical adenopathy.    She has no axillary adenopathy.  Neurological: She is alert and oriented to person, place, and time.  Skin: Skin is warm and dry.  Psychiatric: Mood, memory, affect and judgment normal.  Vitals reviewed.  Today's Exam:    Prior Photo from 05/14/2017:   Left anterior ankle incision w/ some erythema s/p chemo    Results: Imaging History:  2D DIGITAL DIAGNOSTIC BILATERAL MAMMOGRAM WITH CAD AND ADJUNCT TOMO/ ULTRASOUND RIGHT BREAST  COMPARISON: None  ACR Breast Density Category c: The breast tissue is heterogeneously dense, which may obscure small masses.  FINDINGS: The left breast is negative. No mass, architectural distortion, or suspicious microcalcification is identified on  the left.  On the right, there is diffuse skin thickening. Multiple masses with associated architectural distortion are seen the inferior right breast including the lower inner and lower outer quadrants. Additionally, underlying the metallic skin marker with a patient feels a lump the upper-outer right breast far several obscured irregular masses.  Mammographic images were processed with CAD.  On physical exam, multiple masses are palpated diffusely in the right breast. There is a mass eroding through the skin in the inferior right breast centered along the inframammary fold, which is oozing. Masses are palpated in the lower inner quadrant, lower outer quadrant, and the upper-outer quadrant. Masses extend far laterally, along the lateral margin of the right breast.  There is peau de orange of the right breast.  Prominent lymphadenopathy is palpated in the right axilla. No lymphadenopathy is palpated in the infraclavicular region.  Targeted ultrasound is performed, showing multiple irregular hypoechoic masses. In the 6 o'clock region of the right breast is a large irregular mass spans at least 4.8 cm. In the 4:00 to 5:00 region of the right breast an irregular mass measures 4.5 cm. In the 930-10 o'clock position of the right breast centered 9-10 cm from the nipple is an irregular 4.0 x 3.8 x 1.9 cm hypoechoic mass. In the 9:30 position of the right breast at the lateral margin is a probable abnormal intramammary lymph node measuring 1.3 cm greatest diameter. In the 10 o'clock position of the right breast far lateral is a hypoechoic dermal nodule that measures 0.5 cm. Diffuse skin thickening of the right breast measures up to 4.5 mm.  Ultrasound of the right axilla demonstrates an enlarged hypoechoic lymph node with probable extracapsular extension that extends to the skin, and is palpable. This lymph node measures 2.4 x 2.0 cm. At least 4 pathologic right axillary lymph  nodes are imaged. Ultrasound imaging of the infraclavicular region demonstrates lymphadenopathy. Two adjacent abnormal lymph nodes are imaged, the largest measuring 1.7 x 1.4 cm.  IMPRESSION: Locally advanced multicentric neglected right breast cancer with a fungating mass along the inframammary fold and diffuse skin thickening. Right axillary and right infraclavicular pathologic lymphadenopathy  RECOMMENDATION: Ultrasound-guided biopsies are recommended of 2 masses in separate quadrants and of 1 of the right axillary lymph nodes. Biopsies will be performed today and dictated separately.  I have discussed  the findings and recommendations with the patient. Results were also provided in writing at the conclusion of the visit. If applicable, a reminder letter will be sent to the patient regarding the next appointment.  BI-RADS CATEGORY 5: Highly suggestive of malignancy.  Bone Scan 05/15/2017- IMPRESSION: No evidence of metastatic disease. CT Chest/abd/pelvis 05/15/17- IMPRESSION: 1. Numerous right breast masses and metastatic right axillary lymphadenopathy. Findings suspicious for chest wall invasion along the lower lateral aspect of the breast. 2. Two small indeterminate right lower lobe pulmonary nodules will require surveillance. 3. No findings for metastatic disease involving the abdomen/pelvis or osseous structures.  MRI Breast 07/21/17- (after 3rd cycle chemotherapy)   IMPRESSION: 1. There is no significant residual enhancement in the right breast status post neoadjuvant chemotherapy of the patient's known multicentric malignancy. In the lower outer quadrant of the right breast, there is abnormal tissue extending from the skin surface to the underlying pectoralis musculature with loss of fat plane. As there is no enhancement post neoadjuvant chemotherapy, I cannot determine whether the musculature itself has been invaded by tumor.  2. There is a prominent  irregular right axillary lymph node which may represent the known metastatic right axillary lymph node, however susceptibility artifact is not seen within the lymph node for confirmation. Several abnormal nodes were seen on the diagnostic workup. Note that there are two prominent lymph nodes in the low right axilla, distant from the others, likely around the 8-9:00 axis from the nipple.  3.  No MRI evidence of malignancy in the left breast.  Assessment & Plan:  Angel Hale is a 47 y.o. female with a neglected metastatic [phone] cancer that was PR/ER/HER2+ who has received treatment, and based on the MRI mid point and her clinical exam has a great response with no disease evident on my clinical exam, except for the question of the erythematous, tight skin at the inframammary fold on the left outer breast (See images above to compare now versus September).  The patient is here to discuss her surgical options. Given the skin at the inframammary fold, I am leery of whether this will preclude her from getting treatment her at [facility] given possible need for local skin flaps or some other form of reconstruction.  The patient is also asking about options for breast conservation, and given her response, I understand but she did have multicentric disease and the areas that were biopsied were not clipped.  In addition, the skin changes that remain potentially negate against this option.  We have also discussed the need for an axillary dissection given that she was node positive on biopsy, and she understands but is worried due to the risk of lymphedema.    Angel. Nabi is active, and wants to stay active. She would prefer to have breast conservation but understands if this is not an option, and as far as a Emergency planning/management officer, she is unsure if she would want to pursue this after a mastectomy, especially given that she does not have insurance.   Discussed that she will likely be recommended a  modified radical mastectomy and potentially will need to have some reconstruction options at the time of surgery due to the skin at the inframammary fold.   -MRI to follow up her response after completely chemotherapy, scheduled for 2/4  -I have put her on for tumor board on 2/6 in order to discuss the case and get further perspectives and opinions about the appropriate surgical options to offer Angel. Pretty in light of  her goals and neoadjuvant response   -Follow up 10/08/2017 to discuss the above.   All questions were answered to the satisfaction of the patient and family.  I spent over 40 minutes discussing the options with the patient and coordinating the care.   Virl Cagey 09/22/2017, 2:25 PM

## 2017-09-22 NOTE — Patient Instructions (Signed)
Cancer-Related Lymphedema Lymphedema is swelling that is caused by the abnormal collection of lymph under the skin. Lymph is fluid from the tissues in your body that travels in the lymphatic system. This system is part of the immune system and includes the lymph nodes, tonsils, thymus, spleen, and bone marrow. The lymph vessels collect and carry excess fluid, proteins, and wastes from the tissues of the body to the bloodstream. The lymphatic system also works to clean and remove bacteria, viruses, and waste products from the body. Cancer-related lymphedema occurs when the lymphatic system is blocked or damaged from the effects of cancer or cancer treatments. When the lymph vessels or lymph nodes are blocked or damaged, lymph fluid does not drain properly, causing an abnormal buildup of lymph in the surrounding area. This leads to swelling in the torso (trunk) or in the arms or legs (limbs). Cancer-related lymphedema occurs most often with breast, prostate, and uterine cancers as well as melanoma, lymphoma, and other cancers. Lymphedema can be managed with various methods to help prevent or reduce fluid buildup. What are the causes? This condition may be caused by:  Surgery to remove lymph nodes.  Radiation treatment for cancer.  Tumor growth from cancer.  What increases the risk? The following factors may make you more likely to develop this condition:  Older age.  Obesity.  Rheumatoid or inflammatory arthritis.  What are the signs or symptoms? Symptoms of this condition include:  Swelling of the trunk, an arm, or a leg.  Swelling of the feet, toes, or fingers, which can cause shoes or rings to fit more tightly than before.  Redness of the skin over the affected area.  Pain and discomfort in the affected area.  Limited movement of the affected area.  Hardening or thickening of the skin (fibrosis).  How is this diagnosed? This condition may be diagnosed with:  A physical  exam.  A medical history.  A test called bioimpedance spectroscopy (BIS). In this test, painless electrical currents are used to measure fluid levels in the body.  Imaging tests, such as: ? Lymphoscintigraphy. In this test, a low dose of a radioactive substance is injected into the body. The substance is used to trace the flow of lymph through the lymph vessels. ? An MRI. ? A CT scan. ? A Doppler ultrasound.  How is this treated? This condition may be treated with:  Exercise. Certain exercises can help move fluid out of the affected limb.  Massage. A massage technique called manual lymph drainage can help move fluid out of the affected area.  Compression. This treatment involves applying pressure to the affected limb in order to reduce the swelling. Compression may involve: ? Wearing compression stockings or sleeves on the affected limb. ? Bandaging the affected limb. ? Using an external pump that is attached to a sleeve that alternates between applying pressure and releasing pressure.  Surgery. Surgery is usually only done in severe cases, such as when it is hard to move a limb or if swelling does not get better with other treatments.  Complete decongestive therapy. This treatment combines exercise, manual lymph drainage massage, and compression.  Follow these instructions at home: Activity  Exercise regularly as directed by your health care provider.  Do not sit with your legs crossed.  When possible, keep the affected limb raised (elevated) above the level of your heart.  Avoid carrying or lifting heavy things with an arm that is affected by lymphedema. Lifestyle  Eat a healthy diet that  includes a lot of fruits and vegetables.  Find ways to manage stress.  Seek out support and resources from support groups. Caring for the affected area  Check your skin and the affected area every day  Take these steps to help prevent infection: ? Keep the affected area clean and  dry. ? Apply lotion to prevent dry skin. ? Protect your skin from cuts and injuries. For example:  Wear gloves while cooking or gardening.  Use an electric razor when shaving instead of a razor with a blade.  Do not walk barefoot.  Protect the affected area from extreme cold. Do not apply ice to the affected area.  Avoid having your blood pressure checked on the affected limb.  Avoid medical procedures such as blood draws and injections in the affected limb.  Do not use heating pads over the affected area.  Do not wear tight clothes, shoes, or jewelry. General instructions  Wear compression stockings or sleeves as told by your health care provider. These will help to reduce swelling in the affected area.  Take over-the-counter and prescription medicines only as told by your health care provider.  Keep all follow-up visits as told by your health care provider. This is important. Contact a health care provider if:  You have a fever.  You have a cut or injury that does not heal.  You have redness or pain in the affected area.  You have new or increased swelling.  You develop purplish spots or sores (lesions) on your affected limb. Get help right away if:  You develop a skin rash.  You have chills or sweating.  You have trouble breathing. Summary  Cancer-related lymphedema occurs when the lymphatic system is blocked or damaged from the effects of cancer or cancer treatments.  Treatment for this condition may involve a combination of exercise, manual lymph drainage massage, and compression.  Complications can be prevented with activity, lifestyle changes, and daily self-care.  Report any signs of infection or changes in symptoms to your health care provider. This information is not intended to replace advice given to you by your health care provider. Make sure you discuss any questions you have with your health care provider. Document Released: 04/04/2016 Document  Revised: 04/10/2016 Document Reviewed: 04/04/2016 Elsevier Interactive Patient Education  2018 [employer].   Axillary Lymph Node Dissection Axillary lymph node dissection is a procedure to remove (dissect) lymph nodes that are located around the armpit (axilla). Lymph nodes are collections of tissue that filter bacteria, viruses, and waste from the bloodstream. They are part of the body's disease-fighting system (immune system). The axillary lymph nodes drain fluid (lymph) from the breast and surrounding areas. Lymph nodes will be removed from the area around the muscle that is between the breast and shoulder (pectoralis minor muscle). Lymph nodes below the lower edge of the muscle (level I lymph nodes) and underneath the muscle (level II) are usually removed. If cancer has spread to the level II lymph nodes, some lymph nodes above the muscle (level III) may also be removed. This surgery may be done to check whether cancer has spread (staging). The procedure may be done at the same time as a procedure to remove a breast tumor (lumpectomy) or a procedure to remove breast tissue (mastectomy). Tell a health care provider about:  Any allergies you have.  All medicines you are taking, including vitamins, herbs, eye drops, creams, and over-the-counter medicines.  Any problems you or family members have had with anesthetic medicines.  Any blood disorders you have.  Any surgeries you have had.  Any medical conditions you have.  Whether you are pregnant or may be pregnant. What are the risks? Generally, this is a safe procedure. However, problems may occur, including:  Infection.  Bleeding.  Allergic reactions to medicines.  Damage to other structures or organs.  Pain.  Numbness in the arm.  Swelling in the arm (lymphedema).  Blood clots.  What happens before the procedure? Medicines  Ask your health care provider about: ? Changing or stopping your regular medicines. This is  especially important if you are taking diabetes medicines or blood thinners. ? Taking over-the-counter medicines, vitamins, herbs, and supplements. ? Taking medicines such as aspirin and ibuprofen. These medicines can thin your blood. Do not take these medicines unless your health care provider tells you to take them.  You may be given antibiotic medicine to help prevent infection. Staying hydrated Follow instructions from your health care provider about hydration, which may include:  Up to 2 hours before the procedure - you may continue to drink clear liquids, such as water, clear fruit juice, black coffee, and plain tea.  Eating and drinking restrictions Follow instructions from your health care provider about eating and drinking, which may include:  8 hours before the procedure - stop eating heavy meals or foods such as meat, fried foods, or fatty foods.  6 hours before the procedure - stop eating light meals or foods, such as toast or cereal.  6 hours before the procedure - stop drinking milk or drinks that contain milk.  2 hours before the procedure - stop drinking clear liquids.  General instructions  Ask your health care provider how your surgical site will be marked or identified.  Plan to have someone take you home from the hospital or clinic.  Plan to have a responsible adult care for you for at least 24 hours after you leave the hospital or clinic. This is important. What happens during the procedure?  To lower your risk of infection: ? Your health care team will wash or sanitize their hands. ? Your skin will be washed with soap. ? Hair may be removed from the surgical area.  An IV will be inserted into one of your veins.  You will be given one or more of the following: ? A medicine to help you relax (sedative). ? A medicine to numb the area (local anesthetic). ? A medicine to make you fall asleep (general anesthetic).  You will be placed on your back. Your arm  will be straight out from the side of your body, resting on a support.  An incision will be made on the side of your armpit, next to your breast.  Tissue and muscle will be moved out of the way to allow access to the lymph nodes.  Level I and level II axillary lymph nodes will be removed. This total may be 10-40 lymph nodes. If necessary, level III lymph nodes will also be removed.  A drainage tube may be placed in the side of your breast to help drain fluid from the surgical area.  Your incision will be closed with stitches (sutures) and covered with a bandage (dressing). The procedure may vary among health care providers and hospitals. What happens after the procedure?  Your blood pressure, heart rate, breathing rate, and blood oxygen level will be monitored until the medicines you were given have worn off.  Do not drive for 24 hours if you were given  a sedative.  You might be asked to wear a compression garment on your arm. This garment helps to prevent blood clots and reduce swelling in your arm.  If you have a drainage tube, it may be left in place for up to 7-10 days.  It is up to you to get the results of your procedure. Ask your health care provider, or the department that is doing the procedure, when your results will be ready. Summary  An axillary lymph node is a lymph node that is located around the armpit (axilla).  An axillary lymph node dissection is surgery to remove many of the axillary lymph nodes.  During this procedure, lymph nodes are removed from the area around the muscle that is between your breast and shoulder (pectoralis minor muscle).  After the procedure, you may need to wear a compression garment on your arm to prevent blood clots and reduce swelling.  You may have a drainage tube placed in your breast during the surgery. It may be left in for 7-10 days. This information is not intended to replace advice given to you by your health care provider. Make  sure you discuss any questions you have with your health care provider. Document Released: 10/08/2016 Document Revised: 10/08/2016 Document Reviewed: 10/08/2016 Elsevier Interactive Patient Education  2018 [place] [employer].   Total or Modified Radical Mastectomy A total mastectomy and a modified radical mastectomy are types of surgery for breast cancer. If you are having a total mastectomy (simple mastectomy), your entire breast will be removed. If you are having a modified radical mastectomy, your breast and nipple will be removed along with the lymph nodes under your arm. You may also have some of the lining over the muscle tissues under your breast removed. Let your health care provider know about:  Any allergies you have.  All medicines you are taking, including vitamins, herbs, eye drops, creams, and over-the-counter medicines.  Previous problems you or members of your family have had with the use of anesthetics.  Any blood disorders you have.  Any surgeries you have had.  Any medical conditions you have. What are the risks? Generally, this is a safe procedure. However, problems may occur, including:  Pain.  Infection.  Bleeding.  Scar tissue.  Chest numbness on the side of the surgery.  Fluid buildup under the skin flaps where your breast was removed (seroma).  Sensation of throbbing or tingling.  Stress or sadness from losing your breast.  If you have the lymph nodes under your arm removed, you may have arm swelling, weakness, or numbness on the same side of your body as your surgery. What happens before the procedure?  Ask your health care provider about: ? Changing or stopping your regular medicines. This is especially important if you are taking diabetes medicines or blood thinners. ? Taking medicines such as aspirin and ibuprofen. These medicines can thin your blood. Do not take these medicines before your procedure if your health care provider instructs you not  to.  Follow your health care provider's instructions about eating or drinking restrictions.  You may be checked for extra fluid around your lymph nodes (lymphedema).  Plan to have someone take you home after the procedure. What happens during the procedure?  An IV tube will be inserted into one of your veins.  You will be given a medicine that makes you fall asleep (general anesthetic).  Your breast will be cleaned with a germ-killing solution (antiseptic).  A wide incision will be  made around your nipple. The skin and nipple inside the incision will be removed along with all breast tissue.  If you are having a modified radical mastectomy: ? The lining over your chest muscles will be removed. ? The incision may be extended to reach the lymph nodes under your arm, or a second incision may be made. ? The lymph nodes will be removed.  You may have a drainage tube inserted into your incision to collect fluid that builds up after surgery. This tube is connected to a suction bulb.  Your incision or incisions will be closed with stitches (sutures).  A bandage (dressing) will be placed over your breast and under your arm. The procedure may vary among health care providers and hospitals. What happens after the procedure?  You will be moved to a recovery area.  Your blood pressure, heart rate, breathing rate, and blood oxygen level will be monitored often until the medicines you were given have worn off.  You will be given pain medicine as needed.  After a while, you will be taken to a hospital room.  You will be encouraged to get up and walk as soon as you can.  Your IV tube can be removed when you are able to eat and drink.  Your drain may be removed before you go home from the hospital, or you may be sent home with your drain and suction bulb. This information is not intended to replace advice given to you by your health care provider. Make sure you discuss any questions you have  with your health care provider. Document Released: 05/06/2001 Document Revised: 04/17/2016 Document Reviewed: 04/26/2014 Elsevier Interactive Patient Education  [employer].

## 2017-09-22 NOTE — Progress Notes (Signed)
Received a call from Dr. Constance Haw with general surgery.  She is requesting posttreatment breast MRI to evaluate complete treatment response, since patient is desiring breast conservation surgery.  Ideally, Dr. Constance Haw would like to have the MRI completed prior to breast tumor board next Wednesday, 09/30/17.  Dr. Constance Haw requested that I placed orders for the MRI to try to help expedite it being scheduled.  Orders placed for MRI bilateral breasts with/without contrast.  Message sent to scheduling to try to obtain imaging as stated above.  Mike Craze, NP [place] [phone]

## 2017-09-23 ENCOUNTER — Other Ambulatory Visit (HOSPITAL_COMMUNITY): Payer: Self-pay | Admitting: Emergency Medicine

## 2017-09-24 ENCOUNTER — Telehealth: Payer: Self-pay | Admitting: General Surgery

## 2017-09-24 NOTE — Telephone Encounter (Signed)
[facility]  Anxious about any surgery and possibility of lymphedema. Discussing patient at tumor board next week. Tried to discuss the risk and the preventive things that can be done like compression therapy.  Overall I think she is just anxious in general about the unknown that is still ahead. Reassured her that we are just trying to make sure she gets the best care possible.  Curlene Labrum, MD [facility] [address] [place], [place] [address] [phone] (office)

## 2017-09-28 ENCOUNTER — Ambulatory Visit (HOSPITAL_COMMUNITY)
Admission: RE | Admit: 2017-09-28 | Discharge: 2017-09-28 | Disposition: A | Discharge status: Home / Self Care | Payer: Self-pay | Source: Ambulatory Visit | Attending: Adult Health | Admitting: Adult Health

## 2017-09-28 DIAGNOSIS — Z170 Estrogen receptor positive status [ER+]: Secondary | ICD-10-CM | POA: Insufficient documentation

## 2017-09-28 DIAGNOSIS — C50811 Malignant neoplasm of overlapping sites of right female breast: Secondary | ICD-10-CM | POA: Insufficient documentation

## 2017-09-28 MED ORDER — GADOBENATE DIMEGLUMINE 529 MG/ML IV SOLN
15.0000 mL | Freq: Once | INTRAVENOUS | Status: AC | PRN
  Administered 2017-09-28: 12 mL via INTRAVENOUS

## 2017-10-01 ENCOUNTER — Inpatient Hospital Stay (HOSPITAL_COMMUNITY): Payer: Self-pay | Attending: Internal Medicine

## 2017-10-01 ENCOUNTER — Encounter (HOSPITAL_COMMUNITY): Payer: Self-pay

## 2017-10-01 ENCOUNTER — Other Ambulatory Visit: Payer: Self-pay

## 2017-10-01 ENCOUNTER — Telehealth: Payer: Self-pay | Admitting: General Surgery

## 2017-10-01 VITALS — BP 113/79 | HR 75 | Temp 98.1°F | Resp 20 | Wt 129.0 lb

## 2017-10-01 DIAGNOSIS — Z5112 Encounter for antineoplastic immunotherapy: Secondary | ICD-10-CM | POA: Insufficient documentation

## 2017-10-01 DIAGNOSIS — C50811 Malignant neoplasm of overlapping sites of right female breast: Secondary | ICD-10-CM | POA: Insufficient documentation

## 2017-10-01 DIAGNOSIS — Z170 Estrogen receptor positive status [ER+]: Secondary | ICD-10-CM | POA: Insufficient documentation

## 2017-10-01 DIAGNOSIS — Z923 Personal history of irradiation: Secondary | ICD-10-CM | POA: Insufficient documentation

## 2017-10-01 DIAGNOSIS — C773 Secondary and unspecified malignant neoplasm of axilla and upper limb lymph nodes: Secondary | ICD-10-CM | POA: Insufficient documentation

## 2017-10-01 LAB — CBC WITH DIFFERENTIAL/PLATELET
BASOS ABS: 0 10*3/uL (ref 0.0–0.1)
BASOS PCT: 0 %
EOS ABS: 0 10*3/uL (ref 0.0–0.7)
Eosinophils Relative: 0 %
HEMATOCRIT: 36.1 % (ref 36.0–46.0)
HEMOGLOBIN: 11.7 g/dL — AB (ref 12.0–15.0)
Lymphocytes Relative: 15 %
Lymphs Abs: 0.9 10*3/uL (ref 0.7–4.0)
MCH: 33.1 pg (ref 26.0–34.0)
MCHC: 32.4 g/dL (ref 30.0–36.0)
MCV: 102.3 fL — ABNORMAL HIGH (ref 78.0–100.0)
MONOS PCT: 6 %
Monocytes Absolute: 0.4 10*3/uL (ref 0.1–1.0)
NEUTROS ABS: 4.9 10*3/uL (ref 1.7–7.7)
NEUTROS PCT: 79 %
Platelets: 299 10*3/uL (ref 150–400)
RBC: 3.53 MIL/uL — AB (ref 3.87–5.11)
RDW: 14.7 % (ref 11.5–15.5)
WBC: 6.2 10*3/uL (ref 4.0–10.5)

## 2017-10-01 LAB — COMPREHENSIVE METABOLIC PANEL
ALBUMIN: 3.7 g/dL (ref 3.5–5.0)
ALK PHOS: 93 U/L (ref 38–126)
ALT: 20 U/L (ref 14–54)
ANION GAP: 10 (ref 5–15)
AST: 21 U/L (ref 15–41)
BILIRUBIN TOTAL: 0.3 mg/dL (ref 0.3–1.2)
BUN: 12 mg/dL (ref 6–20)
CO2: 26 mmol/L (ref 22–32)
Calcium: 9.2 mg/dL (ref 8.9–10.3)
Chloride: 103 mmol/L (ref 101–111)
Creatinine, Ser: 0.52 mg/dL (ref 0.44–1.00)
GFR calc Af Amer: >60 mL/min (ref >60)
GFR calc non Af Amer: >60 mL/min (ref >60)
GLUCOSE: 88 mg/dL (ref 65–99)
Potassium: 3.9 mmol/L (ref 3.5–5.1)
SODIUM: 139 mmol/L (ref 135–145)
TOTAL PROTEIN: 6.9 g/dL (ref 6.5–8.1)

## 2017-10-01 MED ORDER — DIPHENHYDRAMINE HCL 25 MG PO CAPS
ORAL_CAPSULE | ORAL | Status: AC
  Filled 2017-10-01: qty 2

## 2017-10-01 MED ORDER — SODIUM CHLORIDE 0.9 % IV SOLN
Freq: Once | INTRAVENOUS | Status: AC
  Administered 2017-10-01: 11:00:00 via INTRAVENOUS

## 2017-10-01 MED ORDER — SODIUM CHLORIDE 0.9 % IV SOLN
420.0000 mg | Freq: Once | INTRAVENOUS | Status: AC
  Administered 2017-10-01: 420 mg via INTRAVENOUS
  Filled 2017-10-01: qty 14

## 2017-10-01 MED ORDER — ACETAMINOPHEN 325 MG PO TABS
650.0000 mg | ORAL_TABLET | Freq: Once | ORAL | Status: AC
  Administered 2017-10-01: 650 mg via ORAL

## 2017-10-01 MED ORDER — SODIUM CHLORIDE 0.9 % IV SOLN
6.0000 mg/kg | Freq: Once | INTRAVENOUS | Status: AC
  Administered 2017-10-01: 336 mg via INTRAVENOUS
  Filled 2017-10-01: qty 16

## 2017-10-01 MED ORDER — HEPARIN SOD (PORK) LOCK FLUSH 100 UNIT/ML IV SOLN
500.0000 [IU] | Freq: Once | INTRAVENOUS | Status: AC | PRN
  Administered 2017-10-01: 500 [IU]
  Filled 2017-10-01: qty 5

## 2017-10-01 MED ORDER — DIPHENHYDRAMINE HCL 25 MG PO CAPS
50.0000 mg | ORAL_CAPSULE | Freq: Once | ORAL | Status: AC
  Administered 2017-10-01: 50 mg via ORAL

## 2017-10-01 MED ORDER — ACETAMINOPHEN 325 MG PO TABS
ORAL_TABLET | ORAL | Status: AC
Start: 2017-10-01 — End: 2017-10-01
  Filled 2017-10-01: qty 2

## 2017-10-01 NOTE — Progress Notes (Signed)
Tolerated infusions w/o adverse reaction.  Alert, in no distress.  VSS.  Discharged ambulatory in c/o spouse.  

## 2017-10-01 NOTE — Telephone Encounter (Addendum)
Called Ms. Korf to discuss the decisions from tumor board.   Left message. Called patient back.   At tumor board, agreed patient needs modified radical mastectomy and to position the mastectomy scar over the skin in the inframammary fold that is enhancing on the MRI. Agree needs full axillary dissection.   I have given Reni the option of plastic surgery evaluation pending her desire to get any reconstruction to talk to anyone else regarding the options for the excision. I am unsure how the 100% cone coverage will work with this, but this is an option and the breast navigators seem to think that there would/ could be assistance.  At this time, Ilynn is scheduled to see me on 2/14.  Joanne Gavel is her RN navigator for the breast cancer. Will talk with her regarding referral to plastic surgeons.   Curlene Labrum, MD [facility] [address] [place], [place] [address] [phone] (office)

## 2017-10-02 LAB — CANCER ANTIGEN 27.29: CA 27.29: 19.4 U/mL (ref 0.0–38.6)

## 2017-10-02 LAB — CANCER ANTIGEN 15-3: CA 15-3: 21.6 U/mL (ref 0.0–25.0)

## 2017-10-08 ENCOUNTER — Encounter: Payer: Self-pay | Admitting: General Surgery

## 2017-10-08 ENCOUNTER — Ambulatory Visit (INDEPENDENT_AMBULATORY_CARE_PROVIDER_SITE_OTHER): Payer: Self-pay | Admitting: General Surgery

## 2017-10-08 VITALS — BP 114/83 | HR 96 | Temp 98.4°F | Resp 18 | Ht 65.0 in | Wt 132.0 lb

## 2017-10-08 DIAGNOSIS — C50811 Malignant neoplasm of overlapping sites of right female breast: Secondary | ICD-10-CM

## 2017-10-08 DIAGNOSIS — Z170 Estrogen receptor positive status [ER+]: Secondary | ICD-10-CM

## 2017-10-08 NOTE — Progress Notes (Addendum)
[facility] History and Physical  Problem:  Metastatic Right Breast Cancer- Stage IIIB (T4bN3aM0) with overlapping multicentric disease    Chief Complaint    Breast Cancer      Angel Hale is a 47 y.o. female.  HPI:  Angel Hale is a very pleasant 47 yo who I had the pleasure of meeting several months ago. At that time she presented with an ulceration on the inframammary fold of the right breast that had been present for over 2-3 years.  She had attributed this to a trauma, and had never had any mammograms.  She noticed that the area started to enlarge, and she also noted masses in her axilla.  Unfortunately as suspected she was diagnose her with a neglected metastatic breast cancer in the right breast that was multicentric and was associated with positive axillary nodes and the  ulcerated lesion in her inframammary fold with peau de orange of the skin.  Clinically she had a T4bN3aM0 cancer that was PR/ER/HER2+, and she has completed chemotherapy. She underwent an MRI during the midpoint of her chemotherapy and was found to have no residual breast disease, an area in the left lower quadrant and pectoralis muscle that could not be excluded to have cancer invasion, and prominent lymph nodes in the axilla.  She has now completed her entire course of chemotherapy with TCHP, and has tolerated this well. 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  She has been discussed at tumor board and I have also discussed her case with breats surgeons that I know.  She needs a modified radical mastectomy, and give the location of the skin in the inframammary fold there is concern for needing some reconstruction/ flap for coverage of the area versus altering the standard incision.  I have discussed  this with multiple people and the option of performing an angulated or vertical incision for the mastectomy is an option and possibly needing a rotation full thickness skin flap could be used to close the skin in the inframammary fold if needed. I have since that time talked to Angel Hale on the phone, and let her know that she will be needed a modified radical mastectomy which includes a full axillary dissection.   She is here today to discuss the surgery and to ask questions.     Past Medical History:  Diagnosis Date  . Cancer (HCC)    breast cancer    Past Surgical History:  Procedure Laterality Date  . BUNIONECTOMY Left 2000  . EXTERNAL FIXATION LEG Left 02/16/2017   Procedure: EXTERNAL FIXATION LEG;  Surgeon: Blackman, Christopher Y, MD;  Location: [facility] OR;  Service: Orthopedics;  Laterality: Left;  . HARDWARE REMOVAL Left 05/21/2017   Procedure: REMOVAL OF RETAINED PROMINENT SCREW LEFT ANKLE;  Surgeon: Blackman, Christopher Y, MD;  Location: [facility] OR;  Service: Orthopedics;  Laterality: Left;  . ORIF ANKLE FRACTURE Left 03/03/2017  . ORIF ANKLE FRACTURE Left 03/03/2017   Procedure: OPEN REDUCTION INTERNAL FIXATION (ORIF) LEFT PILON FRACTURE AND LEFT LATERAL MALLEOLUS;  Surgeon: Blackman, Christopher Y, MD;  Location: [facility] OR;  Service: Orthopedics;  Laterality: Left;  . OVARIAN CYST REMOVAL Left 1991  . PORTACATH PLACEMENT    . PORTACATH PLACEMENT Left 05/20/2017   Procedure: INSERTION PORT-A-CATH;  Surgeon: Bridges, Lindsay C,   MD;  Location: [facility];  Service: General;  Laterality: Left;  . WISDOM TOOTH EXTRACTION      Family History  Problem Relation Age of Onset  . Colon cancer Father     Social History   Tobacco Use  . Smoking status: Former Smoker    Packs/day: 1.00    Years: 10.00    Pack years: 10.00    Types: Cigarettes    Last attempt to quit: 02/16/2017    Years since quitting: 0.6  . Smokeless tobacco: Never Used  Substance Use Topics  . Alcohol use: Yes    Comment:  occasional  . Drug use: No    Medications: I have reviewed the patient's current medications. Allergies as of 10/08/2017      Reactions   Penicillins Nausea And Vomiting   Has patient had a PCN reaction causing immediate rash, facial/tongue/throat swelling, SOB or lightheadedness with hypotension: No Has patient had a PCN reaction causing severe rash involving mucus membranes or skin necrosis: No Has patient had a PCN reaction that required hospitalization: No Has patient had a PCN reaction occurring within the last 10 years: No If all of the above answers are "NO", then may proceed with Cephalosporin use.      Medication List        Accurate as of 10/08/17  4:46 PM. Always use your most recent med list.          acetaminophen 500 MG tablet Commonly known as:  TYLENOL Take 1,000 mg by mouth every 6 (six) hours as needed (for pain.).   B-12 PO Take 1 tablet by mouth daily.   CO Q-10 PO Take 1 tablet by mouth daily.   FRANKINCENSE UPLIFTING Oil Take 1 capsule by mouth daily as needed (for home remedy).   gabapentin 100 MG capsule Commonly known as:  NEURONTIN Take 1 capsule daily on first day, then 2 times a day second day, then 3 times a day from there out.   HERCEPTIN IV Inject into the vein. Every 3 weeks   HYDROcodone-acetaminophen 5-325 MG tablet Commonly known as:  NORCO Take 1 tablet by mouth every 4 (four) hours as needed for moderate pain.   LAVENDER OIL PO Take 1 capsule by mouth daily as needed (for home remedy).   lidocaine-prilocaine cream Commonly known as:  EMLA Apply to affected area once   LORazepam 0.5 MG tablet Commonly known as:  ATIVAN Take 1 tablet (0.5 mg total) by mouth every 6 (six) hours as needed (Nausea or vomiting).   methocarbamol 500 MG tablet Commonly known as:  ROBAXIN Take 1 tablet (500 mg total) by mouth every 6 (six) hours as needed for muscle spasms.   ondansetron 8 MG tablet Commonly known as:  ZOFRAN Take 1 tablet (8  mg total) by mouth 2 (two) times daily as needed for refractory nausea / vomiting. Start on day 3 after chemo.   PERJETA IV Inject into the vein. Every 3 weeks   prochlorperazine 10 MG tablet Commonly known as:  COMPAZINE Take 1 tablet (10 mg total) by mouth every 6 (six) hours as needed (Nausea or vomiting).   vitamin C 500 MG tablet Commonly known as:  ASCORBIC ACID Take 1 tablet (500 mg total) by mouth daily.   ZINC PO Take 1 capsule by mouth daily.        ROS:  A comprehensive review of systems was negative except for: Musculoskeletal: positive for left leg pain from prior surgery  Blood pressure   114/83, pulse 96, temperature 98.4 F (36.9 C), resp. rate 18, height 5' 5" (1.651 m), weight 132 lb (59.9 kg). Physical Exam  Constitutional: She is oriented to person, place, and time and well-developed, well-nourished, and in no distress.  HENT:  Head: Normocephalic.  Eyes: Pupils are equal, round, and reactive to light.  Neck: Normal range of motion.  Cardiovascular: Normal rate and regular rhythm.  Pulmonary/Chest: Effort normal and breath sounds normal.  Right breast without masses or axillary adenopathy, right breast inframammary fold with red skin, pinch test of the skin under the inframammary fold with ability to move the skin over the rib cage and appears to be able to approximate the skin once removing this portion, the overall excision of skin and nipple will likely have to be placed in a tangential manner, and a second incision will be needed under the axilla to allow for adequate dissection   Abdominal: Soft. She exhibits no distension. There is no tenderness.  Musculoskeletal: Normal range of motion.  Neurological: She is alert and oriented to person, place, and time.  Skin: Skin is warm and dry.  Psychiatric: Mood, memory, affect and judgment normal.  Vitals reviewed.   Results: Results: Imaging History:  2D DIGITAL DIAGNOSTIC BILATERAL MAMMOGRAM WITH CAD AND  ADJUNCT TOMO/ ULTRASOUND RIGHT BREAST  COMPARISON: None  ACR Breast Density Category c: The breast tissue is heterogeneously dense, which may obscure small masses.  FINDINGS: The left breast is negative. No mass, architectural distortion, or suspicious microcalcification is identified on the left.  On the right, there is diffuse skin thickening. Multiple masses with associated architectural distortion are seen the inferior right breast including the lower inner and lower outer quadrants. Additionally, underlying the metallic skin marker with a patient feels a lump the upper-outer right breast far several obscured irregular masses.  Mammographic images were processed with CAD.  On physical exam, multiple masses are palpated diffusely in the right breast. There is a mass eroding through the skin in the inferior right breast centered along the inframammary fold, which is oozing. Masses are palpated in the lower inner quadrant, lower outer quadrant, and the upper-outer quadrant. Masses extend far laterally, along the lateral margin of the right breast.  There is peau de orange of the right breast.  Prominent lymphadenopathy is palpated in the right axilla. No lymphadenopathy is palpated in the infraclavicular region.  Targeted ultrasound is performed, showing multiple irregular hypoechoic masses. In the 6 o'clock region of the right breast is a large irregular mass spans at least 4.8 cm. In the 4:00 to 5:00 region of the right breast an irregular mass measures 4.5 cm. In the 930-10 o'clock position of the right breast centered 9-10 cm from the nipple is an irregular 4.0 x 3.8 x 1.9 cm hypoechoic mass. In the 9:30 position of the right breast at the lateral margin is a probable abnormal intramammary lymph node measuring 1.3 cm greatest diameter. In the 10 o'clock position of the right breast far lateral is a hypoechoic dermal nodule that measures 0.5 cm. Diffuse  skin thickening of the right breast measures up to 4.5 mm.  Ultrasound of the right axilla demonstrates an enlarged hypoechoic lymph node with probable extracapsular extension that extends to the skin, and is palpable. This lymph node measures 2.4 x 2.0 cm. At least 4 pathologic right axillary lymph nodes are imaged. Ultrasound imaging of the infraclavicular region demonstrates lymphadenopathy. Two adjacent abnormal lymph nodes are imaged, the largest measuring 1.7 x 1.4 cm.  IMPRESSION: Locally advanced multicentric neglected right breast cancer with a fungating mass along the inframammary fold and diffuse skin thickening. Right axillary and right infraclavicular pathologic lymphadenopathy  RECOMMENDATION: Ultrasound-guided biopsies are recommended of 2 masses in separate quadrants and of 1 of the right axillary lymph nodes. Biopsies will be performed today and dictated separately.  I have discussed the findings and recommendations with the patient. Results were also provided in writing at the conclusion of the visit. If applicable, a reminder letter will be sent to the patient regarding the next appointment.  BI-RADS CATEGORY 5: Highly suggestive of malignancy.  Bone Scan 05/15/2017- IMPRESSION: No evidence of metastatic disease. CT Chest/abd/pelvis 05/15/17- IMPRESSION: 1. Numerous right breast masses and metastatic right axillary lymphadenopathy. Findings suspicious for chest wall invasion along the lower lateral aspect of the breast. 2. Two small indeterminate right lower lobe pulmonary nodules will require surveillance. 3. No findings for metastatic disease involving the abdomen/pelvis or osseous structures.  MRI Breast 07/21/17- (after 3rd cycle chemotherapy)   IMPRESSION: 1. There is no significant residual enhancement in the right breast status post neoadjuvant chemotherapy of the patient's known multicentric malignancy. In the lower outer quadrant of the  right breast, there is abnormal tissue extending from the skin surface to the underlying pectoralis musculature with loss of fat plane. As there is no enhancement post neoadjuvant chemotherapy, I cannot determine whether the musculature itself has been invaded by tumor.  2. There is a prominent irregular right axillary lymph node which may represent the known metastatic right axillary lymph node, however susceptibility artifact is not seen within the lymph node for confirmation. Several abnormal nodes were seen on the diagnostic workup. Note that there are two prominent lymph nodes in the low right axilla, distant from the others, likely around the 8-9:00 axis from the nipple.  3. No MRI evidence of malignancy in the left breast.  MRI 09/2017   IMPRESSION: 1. Marked background enhancement limiting evaluation of the breast parenchyma.  2. Subcentimeter enhancing masses in the inner and upper right breast as well as skin thickening with associated enhancement in the region of right inframammary fold suggestive of residual disease. (no abnormal enhancement was seen on prior MRI 07/21/2017).  3. Improvement/resolution of previously seen abnormal lymph nodes in the low right axilla at the approximate 8 to 9:00 position.  RECOMMENDATION: Treatment plan for known right breast malignancy.  BI-RADS CATEGORY  6: Known biopsy-proven malignancy.  Assessment & Plan:  Areen Swingle is a 48 y.o. female with a neglected metastatic [phone] cancer that was PR/ER/HER2+ who has received treatment and who I have discussed at tumor board and with other breast surgeons. She will need a modified radical mastectomy but will need to get the skin removed that is in the inframammary fold where the original ulcerating lesion was located.  She understands this. I have talked to her about the options for reconstruction from a cosmetic but also purely reconstructive/ healing perspective. I have told her  that I am able to do some flaps/ rotation flaps of the skin, but that this could always breakdown with her radiation, or once we removed the breast that this will not be an option.  She at this time does not want to seek out the opinion of a plastic surgeon and wants to see what closure I am able to perform at the time of her mastectomy.   -We have discussed modified radical mastectomy and the need for an altered excision of the skin and nipple  areola complex to include the area of the inframammary fold. She understands that this will likely result in her needing a second incision under her axilla.  -We have discussed the risk and benefits, including but not limited to bleeding, infection, risk of lymphedema, risk of injury to a vessel or nerve resulting in a winged scapula or changes/ inability to do certain movements of the shoulder/ arm.  Specific to her we have discussed the need for radiation following her surgery due to the concern for possible muscle extension, and that the skin closure or flap could breakdown and have an open would that will require dressings / wound vacuum/ and possible skin grafting in the future.   -She understands the risk and benefits, and is worried about lymphedema.  She understands the need for the axillary dissection and agrees to having the surgery.   -Will determine best date in the upcoming weeks and coordinate with oncology, she is 4 weeks out from her chemotherapy and is still scheduled for Perjeta in 2 weeks   -Gave Faustine the contact of Tito Dine, Music therapist, for her to get her 100% [facility] Coverage back   All questions were answered to the satisfaction of the patient and family.  I spent over 40 minutes discussing the plan and answering the patient's questions.  Virl Cagey 10/08/2017, 4:46 PM

## 2017-10-09 ENCOUNTER — Encounter (HOSPITAL_COMMUNITY): Payer: Self-pay

## 2017-10-09 ENCOUNTER — Encounter (HOSPITAL_COMMUNITY)
Admission: RE | Admit: 2017-10-09 | Discharge: 2017-10-09 | Disposition: A | Discharge status: Home / Self Care | Payer: Self-pay | Source: Ambulatory Visit | Attending: General Surgery | Admitting: General Surgery

## 2017-10-09 DIAGNOSIS — Z01818 Encounter for other preprocedural examination: Secondary | ICD-10-CM | POA: Insufficient documentation

## 2017-10-09 LAB — CBC WITH DIFFERENTIAL/PLATELET
BASOS PCT: 0 %
Basophils Absolute: 0 10*3/uL (ref 0.0–0.1)
EOS ABS: 0.1 10*3/uL (ref 0.0–0.7)
EOS PCT: 2 %
HCT: 37.7 % (ref 36.0–46.0)
Hemoglobin: 12.3 g/dL (ref 12.0–15.0)
Lymphocytes Relative: 22 %
Lymphs Abs: 1 10*3/uL (ref 0.7–4.0)
MCH: 33.4 pg (ref 26.0–34.0)
MCHC: 32.6 g/dL (ref 30.0–36.0)
MCV: 102.4 fL — ABNORMAL HIGH (ref 78.0–100.0)
MONO ABS: 0.3 10*3/uL (ref 0.1–1.0)
MONOS PCT: 7 %
Neutro Abs: 3.2 10*3/uL (ref 1.7–7.7)
Neutrophils Relative %: 69 %
PLATELETS: 329 10*3/uL (ref 150–400)
RBC: 3.68 MIL/uL — ABNORMAL LOW (ref 3.87–5.11)
RDW: 14 % (ref 11.5–15.5)
WBC: 4.7 10*3/uL (ref 4.0–10.5)

## 2017-10-09 LAB — BASIC METABOLIC PANEL
Anion gap: 9 (ref 5–15)
BUN: 11 mg/dL (ref 6–20)
CALCIUM: 9.4 mg/dL (ref 8.9–10.3)
CO2: 26 mmol/L (ref 22–32)
CREATININE: 0.63 mg/dL (ref 0.44–1.00)
Chloride: 102 mmol/L (ref 101–111)
Glucose, Bld: 88 mg/dL (ref 65–99)
Potassium: 4.1 mmol/L (ref 3.5–5.1)
Sodium: 137 mmol/L (ref 135–145)

## 2017-10-09 LAB — TYPE AND SCREEN
ABO/RH(D): B POS
ANTIBODY SCREEN: NEGATIVE

## 2017-10-09 LAB — HCG, SERUM, QUALITATIVE: Preg, Serum: NEGATIVE

## 2017-10-09 NOTE — Patient Instructions (Signed)
Angel Hale  10/09/2017     @PREFPERIOPPHARMACY @   Your procedure is scheduled on  10/14/2017 .  Report to [facility] at  820   A.M.  Call this number if you have problems the morning of surgery:  [phone]   Remember:  Do not eat food or drink liquids after midnight.  Take these medicines the morning of surgery with A SIP OF WATER  Gabapentin, hydrocodone, ativan, robaxin, zofran or compazine.   Do not wear jewelry, make-up or nail polish.  Do not wear lotions, powders, or perfumes, or deodorant.  Do not shave 48 hours prior to surgery.  Men may shave face and neck.  Do not bring valuables to the hospital.  [facility] is not responsible for any belongings or valuables.  Contacts, dentures or bridgework may not be worn into surgery.  Leave your suitcase in the car.  After surgery it may be brought to your room.  For patients admitted to the hospital, discharge time will be determined by your treatment team.  Patients discharged the day of surgery will not be allowed to drive home.   Name and phone number of your driver:   family Special instructions:  None  Please read over the following fact sheets that you were given. Pain Booklet, Coughing and Deep Breathing, Blood Transfusion Information, Surgical Site Infection Prevention, Anesthesia Post-op Instructions and Care and Recovery After Surgery.      Total or Modified Radical Mastectomy A total mastectomy and a modified radical mastectomy are types of surgery for breast cancer. If you are having a total mastectomy (simple mastectomy), your entire breast will be removed. If you are having a modified radical mastectomy, your breast and nipple will be removed along with the lymph nodes under your arm. You may also have some of the lining over the muscle tissues under your breast removed. Let your health care provider know about:  Any allergies you have.  All medicines you are taking, including vitamins,  herbs, eye drops, creams, and over-the-counter medicines.  Previous problems you or members of your family have had with the use of anesthetics.  Any blood disorders you have.  Any surgeries you have had.  Any medical conditions you have. What are the risks? Generally, this is a safe procedure. However, problems may occur, including:  Pain.  Infection.  Bleeding.  Scar tissue.  Chest numbness on the side of the surgery.  Fluid buildup under the skin flaps where your breast was removed (seroma).  Sensation of throbbing or tingling.  Stress or sadness from losing your breast.  If you have the lymph nodes under your arm removed, you may have arm swelling, weakness, or numbness on the same side of your body as your surgery. What happens before the procedure?  Ask your health care provider about: ? Changing or stopping your regular medicines. This is especially important if you are taking diabetes medicines or blood thinners. ? Taking medicines such as aspirin and ibuprofen. These medicines can thin your blood. Do not take these medicines before your procedure if your health care provider instructs you not to.  Follow your health care provider's instructions about eating or drinking restrictions.  You may be checked for extra fluid around your lymph nodes (lymphedema).  Plan to have someone take you home after the procedure. What happens during the procedure?  An IV tube will be inserted into one of your veins.  You will  be given a medicine that makes you fall asleep (general anesthetic).  Your breast will be cleaned with a germ-killing solution (antiseptic).  A wide incision will be made around your nipple. The skin and nipple inside the incision will be removed along with all breast tissue.  If you are having a modified radical mastectomy: ? The lining over your chest muscles will be removed. ? The incision may be extended to reach the lymph nodes under your arm, or a  second incision may be made. ? The lymph nodes will be removed.  You may have a drainage tube inserted into your incision to collect fluid that builds up after surgery. This tube is connected to a suction bulb.  Your incision or incisions will be closed with stitches (sutures).  A bandage (dressing) will be placed over your breast and under your arm. The procedure may vary among health care providers and hospitals. What happens after the procedure?  You will be moved to a recovery area.  Your blood pressure, heart rate, breathing rate, and blood oxygen level will be monitored often until the medicines you were given have worn off.  You will be given pain medicine as needed.  After a while, you will be taken to a hospital room.  You will be encouraged to get up and walk as soon as you can.  Your IV tube can be removed when you are able to eat and drink.  Your drain may be removed before you go home from the hospital, or you may be sent home with your drain and suction bulb. This information is not intended to replace advice given to you by your health care provider. Make sure you discuss any questions you have with your health care provider. Document Released: 05/06/2001 Document Revised: 04/17/2016 Document Reviewed: 04/26/2014 Elsevier Interactive Patient Education  2018 [place]. Total or Modified Radical Mastectomy, Care After Refer to this sheet in the next few weeks. These instructions provide you with information about caring for yourself after your procedure. Your health care provider may also give you more specific instructions. Your treatment has been planned according to current medical practices, but problems sometimes occur. Call your health care provider if you have any problems or questions after your procedure. What can I expect after the procedure? After your procedure, it is common to have:  Pain.  Numbness.  Stiffness in your arm or shoulder.  Feelings of  stress, sadness, or depression.  If the lymph nodes under your arm were removed, you may have arm swelling, weakness, or numbness on the same side of your body as your surgery. Follow these instructions at home: Incision care  There are many different ways to close and cover an incision, including stitches, skin glue, and adhesive strips. Follow your health care provider's instructions about: ? Incision care. ? Bandage (dressing) changes and removal. ? Incision closure removal.  Check your incision area every day for signs of infection. Watch for: ? Redness, swelling, or pain. ? Fluid, blood, or pus.  If you were sent home with a surgical drain in place, follow your health care provider's instructions for emptying it. Bathing  Do not take baths, swim, or use a hot tub until your health care provider approves.  Take sponge baths until your health care provider says that you can start showering or bathing. Activity  Return to your normal activities as directed by your health care provider.  Avoid strenuous exercise.  Be careful to avoid any activities that  could cause an injury to your arm on the side of your surgery.  Do not lift anything that is heavier than 10 lb (4.5 kg). Avoid lifting with the arm that is on the side of your surgery.  Do not carry heavy objects on your shoulder.  After your drain is removed, you should perform exercises to keep your arm from getting stiff and swollen. Talk with your health care provider about which exercises are safe for you. General instructions  Take medicines only as directed by your health care provider.  You may eat what you usually do.  Keep your arm elevated when at rest.  Do not wear tight jewelry on your arm, wrist, or fingers on the side of your surgery.  Get checked for extra fluid around your lymph nodes (lymphedema) as often as told by your health care provider.  If you had a modified radical mastectomy, always let your  health care providers know that lymph nodes under your arm were removed. This is important information to share before you are involved in certain procedures, such as giving blood or having your blood pressure taken. Contact a health care provider if:  You have a fever.  Your pain medicine is not working.  Your arm swelling, weakness, or numbness has not improved after a few weeks.  You have new swelling in your breast or arm.  You have redness, swelling, or pain in your incision area.  You have fluid, blood, or pus coming from your incision. Get help right away if:  You have very bad pain in your breast or arm.  You have chest pain.  You have difficulty breathing. This information is not intended to replace advice given to you by your health care provider. Make sure you discuss any questions you have with your health care provider. Document Released: 04/03/2004 Document Revised: 04/17/2016 Document Reviewed: 04/26/2014 Elsevier Interactive Patient Education  2018 [place] Anesthesia, Adult General anesthesia is the use of medicines to make a person "go to sleep" (be unconscious) for a medical procedure. General anesthesia is often recommended when a procedure:  Is long.  Requires you to be still or in an unusual position.  Is major and can cause you to lose blood.  Is impossible to do without general anesthesia.  The medicines used for general anesthesia are called general anesthetics. In addition to making you sleep, the medicines:  Prevent pain.  Control your blood pressure.  Relax your muscles.  Tell a health care provider about:  Any allergies you have.  All medicines you are taking, including vitamins, herbs, eye drops, creams, and over-the-counter medicines.  Any problems you or family members have had with anesthetic medicines.  Types of anesthetics you have had in the past.  Any bleeding disorders you have.  Any surgeries you have  had.  Any medical conditions you have.  Any history of heart or lung conditions, such as heart failure, sleep apnea, or chronic obstructive pulmonary disease (COPD).  Whether you are pregnant or may be pregnant.  Whether you use tobacco, alcohol, marijuana, or street drugs.  Any history of Armed forces logistics/support/administrative officer.  Any history of depression or anxiety. What are the risks? Generally, this is a safe procedure. However, problems may occur, including:  Allergic reaction to anesthetics.  Lung and heart problems.  Inhaling food or liquids from your stomach into your lungs (aspiration).  Injury to nerves.  Waking up during your procedure and being unable to move (rare).  Extreme  agitation or a state of mental confusion (delirium) when you wake up from the anesthetic.  Air in the bloodstream, which can lead to stroke.  These problems are more likely to develop if you are having a major surgery or if you have an advanced medical condition. You can prevent some of these complications by answering all of your health care provider's questions thoroughly and by following all pre-procedure instructions. General anesthesia can cause side effects, including:  Nausea or vomiting  A sore throat from the breathing tube.  Feeling cold or shivery.  Feeling tired, washed out, or achy.  Sleepiness or drowsiness.  Confusion or agitation.  What happens before the procedure? Staying hydrated Follow instructions from your health care provider about hydration, which may include:  Up to 2 hours before the procedure - you may continue to drink clear liquids, such as water, clear fruit juice, black coffee, and plain tea.  Eating and drinking restrictions Follow instructions from your health care provider about eating and drinking, which may include:  8 hours before the procedure - stop eating heavy meals or foods such as meat, fried foods, or fatty foods.  6 hours before the procedure - stop eating  light meals or foods, such as toast or cereal.  6 hours before the procedure - stop drinking milk or drinks that contain milk.  2 hours before the procedure - stop drinking clear liquids.  Medicines  Ask your health care provider about: ? Changing or stopping your regular medicines. This is especially important if you are taking diabetes medicines or blood thinners. ? Taking medicines such as aspirin and ibuprofen. These medicines can thin your blood. Do not take these medicines before your procedure if your health care provider instructs you not to. ? Taking new dietary supplements or medicines. Do not take these during the week before your procedure unless your health care provider approves them.  If you are told to take a medicine or to continue taking a medicine on the day of the procedure, take the medicine with sips of water. General instructions   Ask if you will be going home the same day, the following day, or after a longer hospital stay. ? Plan to have someone take you home. ? Plan to have someone stay with you for the first 24 hours after you leave the hospital or clinic.  For 3-6 weeks before the procedure, try not to use any tobacco products, such as cigarettes, chewing tobacco, and e-cigarettes.  You may brush your teeth on the morning of the procedure, but make sure to spit out the toothpaste. What happens during the procedure?  You will be given anesthetics through a mask and through an IV tube in one of your veins.  You may receive medicine to help you relax (sedative).  As soon as you are asleep, a breathing tube may be used to help you breathe.  An anesthesia specialist will stay with you throughout the procedure. He or she will help keep you comfortable and safe by continuing to give you medicines and adjusting the amount of medicine that you get. He or she will also watch your blood pressure, pulse, and oxygen levels to make sure that the anesthetics do not cause  any problems.  If a breathing tube was used to help you breathe, it will be removed before you wake up. The procedure may vary among health care providers and hospitals. What happens after the procedure?  You will wake up, often slowly, after  the procedure is complete, usually in a recovery area.  Your blood pressure, heart rate, breathing rate, and blood oxygen level will be monitored until the medicines you were given have worn off.  You may be given medicine to help you calm down if you feel anxious or agitated.  If you will be going home the same day, your health care provider may check to make sure you can stand, drink, and urinate.  Your health care providers will treat your pain and side effects before you go home.  Do not drive for 24 hours if you received a sedative.  You may: ? Feel nauseous and vomit. ? Have a sore throat. ? Have mental slowness. ? Feel cold or shivery. ? Feel sleepy. ? Feel tired. ? Feel sore or achy, even in parts of your body where you did not have surgery. This information is not intended to replace advice given to you by your health care provider. Make sure you discuss any questions you have with your health care provider. Document Released: 11/18/2007 Document Revised: 01/22/2016 Document Reviewed: 07/26/2015 Elsevier Interactive Patient Education  2018 [place] Anesthesia, Adult, Care After These instructions provide you with information about caring for yourself after your procedure. Your health care provider may also give you more specific instructions. Your treatment has been planned according to current medical practices, but problems sometimes occur. Call your health care provider if you have any problems or questions after your procedure. What can I expect after the procedure? After the procedure, it is common to have:  Vomiting.  A sore throat.  Mental slowness.  It is common to feel:  Nauseous.  Cold or  shivery.  Sleepy.  Tired.  Sore or achy, even in parts of your body where you did not have surgery.  Follow these instructions at home: For at least 24 hours after the procedure:  Do not: ? Participate in activities where you could fall or become injured. ? Drive. ? Use heavy machinery. ? Drink alcohol. ? Take sleeping pills or medicines that cause drowsiness. ? Make important decisions or sign legal documents. ? Take care of children on your own.  Rest. Eating and drinking  If you vomit, drink water, juice, or soup when you can drink without vomiting.  Drink enough fluid to keep your urine clear or pale yellow.  Make sure you have little or no nausea before eating solid foods.  Follow the diet recommended by your health care provider. General instructions  Have a responsible adult stay with you until you are awake and alert.  Return to your normal activities as told by your health care provider. Ask your health care provider what activities are safe for you.  Take over-the-counter and prescription medicines only as told by your health care provider.  If you smoke, do not smoke without supervision.  Keep all follow-up visits as told by your health care provider. This is important. Contact a health care provider if:  You continue to have nausea or vomiting at home, and medicines are not helpful.  You cannot drink fluids or start eating again.  You cannot urinate after 8-12 hours.  You develop a skin rash.  You have fever.  You have increasing redness at the site of your procedure. Get help right away if:  You have difficulty breathing.  You have chest pain.  You have unexpected bleeding.  You feel that you are having a life-threatening or urgent problem. This information is not intended to  replace advice given to you by your health care provider. Make sure you discuss any questions you have with your health care provider. Document Released: 11/17/2000  Document Revised: 01/14/2016 Document Reviewed: 07/26/2015 Elsevier Interactive Patient Education  [employer].

## 2017-10-09 NOTE — H&P (Signed)
[facility] History and Physical  Problem:  Metastatic Right Breast Cancer- Stage IIIB (T4bN3aM0) with overlapping multicentric disease    Chief Complaint    Breast Cancer      Angel Hale is a 47 y.o. female.  HPI:  Angel Hale is a very pleasant 47 yo who I had the pleasure of meeting several months ago. At that time she presented with an ulceration on the inframammary fold of the right breast that had been present for over 2-3 years.  She had attributed this to a trauma, and had never had any mammograms.  She noticed that the area started to enlarge, and she also noted masses in her axilla.  Unfortunately as suspected she was diagnose her with a neglected metastatic breast cancer in the right breast that was multicentric and was associated with positive axillary nodes and the  ulcerated lesion in her inframammary fold with peau de orange of the skin.  Clinically she had a T4bN3aM0 cancer that was PR/ER/HER2+, and she has completed chemotherapy. She underwent an MRI during the midpoint of her chemotherapy and was found to have no residual breast disease, an area in the left lower quadrant and pectoralis muscle that could not be excluded to have cancer invasion, and prominent lymph nodes in the axilla.  She has now completed her entire course of chemotherapy with TCHP, and has tolerated this well. 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  She has been discussed at tumor board and I have also discussed her case with breats surgeons that I know.  She needs a modified radical mastectomy, and give the location of the skin in the inframammary fold there is concern for needing some reconstruction/ flap for coverage of the area versus altering the standard incision.  I have discussed  this with multiple people and the option of performing an angulated or vertical incision for the mastectomy is an option and possibly needing a rotation full thickness skin flap could be used to close the skin in the inframammary fold if needed. I have since that time talked to [name] on the phone, and let her know that she will be needed a modified radical mastectomy which includes a full axillary dissection.   She is here today to discuss the surgery and to ask questions.     Past Medical History:  Diagnosis Date  . Cancer (HCC)    breast cancer    Past Surgical History:  Procedure Laterality Date  . BUNIONECTOMY Left 2000  . EXTERNAL FIXATION LEG Left 02/16/2017   Procedure: EXTERNAL FIXATION LEG;  Surgeon: Blackman, Christopher Y, MD;  Location: [facility] OR;  Service: Orthopedics;  Laterality: Left;  . HARDWARE REMOVAL Left 05/21/2017   Procedure: REMOVAL OF RETAINED PROMINENT SCREW LEFT ANKLE;  Surgeon: Blackman, Christopher Y, MD;  Location: [facility] OR;  Service: Orthopedics;  Laterality: Left;  . ORIF ANKLE FRACTURE Left 03/03/2017  . ORIF ANKLE FRACTURE Left 03/03/2017   Procedure: OPEN REDUCTION INTERNAL FIXATION (ORIF) LEFT PILON FRACTURE AND LEFT LATERAL MALLEOLUS;  Surgeon: Blackman, Christopher Y, MD;  Location: [facility] OR;  Service: Orthopedics;  Laterality: Left;  . OVARIAN CYST REMOVAL Left 1991  . PORTACATH PLACEMENT    . PORTACATH PLACEMENT Left 05/20/2017   Procedure: INSERTION PORT-A-CATH;  Surgeon: Avant Printy C,   MD;  Location: [facility]S;  Service: General;  Laterality: Left;  . WISDOM TOOTH EXTRACTION      Family History  Problem Relation Age of Onset  . Colon cancer Father     Social History   Tobacco Use  . Smoking status: Former Smoker    Packs/day: 1.00    Years: 10.00    Pack years: 10.00    Types: Cigarettes    Last attempt to quit: 02/16/2017    Years since quitting: 0.6  . Smokeless tobacco: Never Used  Substance Use Topics  . Alcohol use: Yes    Comment:  occasional  . Drug use: No    Medications: I have reviewed the patient's current medications. Allergies as of 10/08/2017      Reactions   Penicillins Nausea And Vomiting   Has patient had a PCN reaction causing immediate rash, facial/tongue/throat swelling, SOB or lightheadedness with hypotension: No Has patient had a PCN reaction causing severe rash involving mucus membranes or skin necrosis: No Has patient had a PCN reaction that required hospitalization: No Has patient had a PCN reaction occurring within the last 10 years: No If all of the above answers are "NO", then may proceed with Cephalosporin use.      Medication List        Accurate as of 10/08/17  4:46 PM. Always use your most recent med list.          acetaminophen 500 MG tablet Commonly known as:  TYLENOL Take 1,000 mg by mouth every 6 (six) hours as needed (for pain.).   B-12 PO Take 1 tablet by mouth daily.   CO Q-10 PO Take 1 tablet by mouth daily.   FRANKINCENSE UPLIFTING Oil Take 1 capsule by mouth daily as needed (for home remedy).   gabapentin 100 MG capsule Commonly known as:  NEURONTIN Take 1 capsule daily on first day, then 2 times a day second day, then 3 times a day from there out.   HERCEPTIN IV Inject into the vein. Every 3 weeks   HYDROcodone-acetaminophen 5-325 MG tablet Commonly known as:  NORCO Take 1 tablet by mouth every 4 (four) hours as needed for moderate pain.   LAVENDER OIL PO Take 1 capsule by mouth daily as needed (for home remedy).   lidocaine-prilocaine cream Commonly known as:  EMLA Apply to affected area once   LORazepam 0.5 MG tablet Commonly known as:  ATIVAN Take 1 tablet (0.5 mg total) by mouth every 6 (six) hours as needed (Nausea or vomiting).   methocarbamol 500 MG tablet Commonly known as:  ROBAXIN Take 1 tablet (500 mg total) by mouth every 6 (six) hours as needed for muscle spasms.   ondansetron 8 MG tablet Commonly known as:  ZOFRAN Take 1 tablet (8  mg total) by mouth 2 (two) times daily as needed for refractory nausea / vomiting. Start on day 3 after chemo.   PERJETA IV Inject into the vein. Every 3 weeks   prochlorperazine 10 MG tablet Commonly known as:  COMPAZINE Take 1 tablet (10 mg total) by mouth every 6 (six) hours as needed (Nausea or vomiting).   vitamin C 500 MG tablet Commonly known as:  ASCORBIC ACID Take 1 tablet (500 mg total) by mouth daily.   ZINC PO Take 1 capsule by mouth daily.        ROS:  A comprehensive review of systems was negative except for: Musculoskeletal: positive for left leg pain from prior surgery  Blood pressure   114/83, pulse 96, temperature 98.4 F (36.9 C), resp. rate 18, height 5' 5" (1.651 m), weight 132 lb (59.9 kg). Physical Exam  Constitutional: She is oriented to person, place, and time and well-developed, well-nourished, and in no distress.  HENT:  Head: Normocephalic.  Eyes: Pupils are equal, round, and reactive to light.  Neck: Normal range of motion.  Cardiovascular: Normal rate and regular rhythm.  Pulmonary/Chest: Effort normal and breath sounds normal.  Right breast without masses or axillary adenopathy, right breast inframammary fold with red skin, pinch test of the skin under the inframammary fold with ability to move the skin over the rib cage and appears to be able to approximate the skin once removing this portion, the overall excision of skin and nipple will likely have to be placed in a tangential manner, and a second incision will be needed under the axilla to allow for adequate dissection   Abdominal: Soft. She exhibits no distension. There is no tenderness.  Musculoskeletal: Normal range of motion.  Neurological: She is alert and oriented to person, place, and time.  Skin: Skin is warm and dry.  Psychiatric: Mood, memory, affect and judgment normal.  Vitals reviewed.   Results: Results: Imaging History:  2D DIGITAL DIAGNOSTIC BILATERAL MAMMOGRAM WITH CAD AND  ADJUNCT TOMO/ ULTRASOUND RIGHT BREAST  COMPARISON: None  ACR Breast Density Category c: The breast tissue is heterogeneously dense, which may obscure small masses.  FINDINGS: The left breast is negative. No mass, architectural distortion, or suspicious microcalcification is identified on the left.  On the right, there is diffuse skin thickening. Multiple masses with associated architectural distortion are seen the inferior right breast including the lower inner and lower outer quadrants. Additionally, underlying the metallic skin marker with a patient feels a lump the upper-outer right breast far several obscured irregular masses.  Mammographic images were processed with CAD.  On physical exam, multiple masses are palpated diffusely in the right breast. There is a mass eroding through the skin in the inferior right breast centered along the inframammary fold, which is oozing. Masses are palpated in the lower inner quadrant, lower outer quadrant, and the upper-outer quadrant. Masses extend far laterally, along the lateral margin of the right breast.  There is peau de orange of the right breast.  Prominent lymphadenopathy is palpated in the right axilla. No lymphadenopathy is palpated in the infraclavicular region.  Targeted ultrasound is performed, showing multiple irregular hypoechoic masses. In the 6 o'clock region of the right breast is a large irregular mass spans at least 4.8 cm. In the 4:00 to 5:00 region of the right breast an irregular mass measures 4.5 cm. In the 930-10 o'clock position of the right breast centered 9-10 cm from the nipple is an irregular 4.0 x 3.8 x 1.9 cm hypoechoic mass. In the 9:30 position of the right breast at the lateral margin is a probable abnormal intramammary lymph node measuring 1.3 cm greatest diameter. In the 10 o'clock position of the right breast far lateral is a hypoechoic dermal nodule that measures 0.5 cm. Diffuse  skin thickening of the right breast measures up to 4.5 mm.  Ultrasound of the right axilla demonstrates an enlarged hypoechoic lymph node with probable extracapsular extension that extends to the skin, and is palpable. This lymph node measures 2.4 x 2.0 cm. At least 4 pathologic right axillary lymph nodes are imaged. Ultrasound imaging of the infraclavicular region demonstrates lymphadenopathy. Two adjacent abnormal lymph nodes are imaged, the largest measuring 1.7 x 1.4 cm.    IMPRESSION: Locally advanced multicentric neglected right breast cancer with a fungating mass along the inframammary fold and diffuse skin thickening. Right axillary and right infraclavicular pathologic lymphadenopathy  RECOMMENDATION: Ultrasound-guided biopsies are recommended of 2 masses in separate quadrants and of 1 of the right axillary lymph nodes. Biopsies will be performed today and dictated separately.  I have discussed the findings and recommendations with the patient. Results were also provided in writing at the conclusion of the visit. If applicable, a reminder letter will be sent to the patient regarding the next appointment.  BI-RADS CATEGORY 5: Highly suggestive of malignancy.  Bone Scan 05/15/2017- IMPRESSION: No evidence of metastatic disease. CT Chest/abd/pelvis 05/15/17- IMPRESSION: 1. Numerous right breast masses and metastatic right axillary lymphadenopathy. Findings suspicious for chest wall invasion along the lower lateral aspect of the breast. 2. Two small indeterminate right lower lobe pulmonary nodules will require surveillance. 3. No findings for metastatic disease involving the abdomen/pelvis or osseous structures.  MRI Breast 07/21/17- (after 3rd cycle chemotherapy)   IMPRESSION: 1. There is no significant residual enhancement in the right breast status post neoadjuvant chemotherapy of the patient's known multicentric malignancy. In the lower outer quadrant of the  right breast, there is abnormal tissue extending from the skin surface to the underlying pectoralis musculature with loss of fat plane. As there is no enhancement post neoadjuvant chemotherapy, I cannot determine whether the musculature itself has been invaded by tumor.  2. There is a prominent irregular right axillary lymph node which may represent the known metastatic right axillary lymph node, however susceptibility artifact is not seen within the lymph node for confirmation. Several abnormal nodes were seen on the diagnostic workup. Note that there are two prominent lymph nodes in the low right axilla, distant from the others, likely around the 8-9:00 axis from the nipple.  3. No MRI evidence of malignancy in the left breast.  MRI 09/2017   IMPRESSION: 1. Marked background enhancement limiting evaluation of the breast parenchyma.  2. Subcentimeter enhancing masses in the inner and upper right breast as well as skin thickening with associated enhancement in the region of right inframammary fold suggestive of residual disease. (no abnormal enhancement was seen on prior MRI 07/21/2017).  3. Improvement/resolution of previously seen abnormal lymph nodes in the low right axilla at the approximate 8 to 9:00 position.  RECOMMENDATION: Treatment plan for known right breast malignancy.  BI-RADS CATEGORY  6: Known biopsy-proven malignancy.  Assessment & Plan:  Lennyx Guerrieri is a 46 y.o. female with a neglected metastatic T4bN3aM0 cancer that was PR/ER/HER2+ who has received treatment and who I have discussed at tumor board and with other breast surgeons. She will need a modified radical mastectomy but will need to get the skin removed that is in the inframammary fold where the original ulcerating lesion was located.  She understands this. I have talked to her about the options for reconstruction from a cosmetic but also purely reconstructive/ healing perspective. I have told her  that I am able to do some flaps/ rotation flaps of the skin, but that this could always breakdown with her radiation, or once we removed the breast that this will not be an option.  She at this time does not want to seek out the opinion of a plastic surgeon and wants to see what closure I am able to perform at the time of her mastectomy.   -We have discussed modified radical mastectomy and the need for an altered excision of the skin and nipple   areola complex to include the area of the inframammary fold. She understands that this will likely result in her needing a second incision under her axilla.  -We have discussed the risk and benefits, including but not limited to bleeding, infection, risk of lymphedema, risk of injury to a vessel or nerve resulting in a winged scapula or changes/ inability to do certain movements of the shoulder/ arm.  Specific to her we have discussed the need for radiation following her surgery due to the concern for possible muscle extension, and that the skin closure or flap could breakdown and have an open would that will require dressings / wound vacuum/ and possible skin grafting in the future.   -She understands the risk and benefits, and is worried about lymphedema.  She understands the need for the axillary dissection and agrees to having the surgery.   -Will determine best date in the upcoming weeks and coordinate with oncology, she is 4 weeks out from her chemotherapy and is still scheduled for [facility] in 2 weeks   -Gave [place] the contact of Ingrid, Financial advisor, for her to get her 100% [facility] Coverage back   All questions were answered to the satisfaction of the patient and family.  I spent over 40 minutes discussing the plan and answering the patient's questions.  Zalia Hautala C Matison Nuccio 10/08/2017, 4:46 PM       

## 2017-10-14 ENCOUNTER — Encounter (HOSPITAL_COMMUNITY): Payer: Self-pay | Admitting: *Deleted

## 2017-10-14 ENCOUNTER — Ambulatory Visit (HOSPITAL_COMMUNITY): Payer: Self-pay | Admitting: Anesthesiology

## 2017-10-14 ENCOUNTER — Inpatient Hospital Stay (HOSPITAL_COMMUNITY)
Admission: RE | Admit: 2017-10-14 | Discharge: 2017-10-16 | DRG: 583 | Disposition: A | Discharge status: Home / Self Care | Payer: Self-pay | Source: Ambulatory Visit | Attending: General Surgery | Admitting: General Surgery

## 2017-10-14 ENCOUNTER — Encounter (HOSPITAL_COMMUNITY)
Admission: RE | Disposition: A | Discharge status: Home / Self Care | Payer: Self-pay | Source: Ambulatory Visit | Attending: General Surgery

## 2017-10-14 DIAGNOSIS — Z9221 Personal history of antineoplastic chemotherapy: Secondary | ICD-10-CM

## 2017-10-14 DIAGNOSIS — Z170 Estrogen receptor positive status [ER+]: Secondary | ICD-10-CM

## 2017-10-14 DIAGNOSIS — G629 Polyneuropathy, unspecified: Secondary | ICD-10-CM | POA: Diagnosis present

## 2017-10-14 DIAGNOSIS — Z880 Allergy status to penicillin: Secondary | ICD-10-CM

## 2017-10-14 DIAGNOSIS — C50811 Malignant neoplasm of overlapping sites of right female breast: Principal | ICD-10-CM | POA: Diagnosis present

## 2017-10-14 DIAGNOSIS — Z87891 Personal history of nicotine dependence: Secondary | ICD-10-CM

## 2017-10-14 DIAGNOSIS — Z800 Family history of malignant neoplasm of digestive organs: Secondary | ICD-10-CM

## 2017-10-14 DIAGNOSIS — C50911 Malignant neoplasm of unspecified site of right female breast: Secondary | ICD-10-CM | POA: Diagnosis present

## 2017-10-14 DIAGNOSIS — Z1501 Genetic susceptibility to malignant neoplasm of breast: Secondary | ICD-10-CM

## 2017-10-14 DIAGNOSIS — C773 Secondary and unspecified malignant neoplasm of axilla and upper limb lymph nodes: Secondary | ICD-10-CM

## 2017-10-14 HISTORY — PX: MASTECTOMY MODIFIED RADICAL: SHX5962

## 2017-10-14 SURGERY — MASTECTOMY, MODIFIED RADICAL
Anesthesia: General | Site: Breast | Laterality: Right

## 2017-10-14 MED ORDER — CHLORHEXIDINE GLUCONATE CLOTH 2 % EX PADS: 6.0000 | MEDICATED_PAD | Freq: Once | CUTANEOUS | Status: DC

## 2017-10-14 MED ORDER — ACETAMINOPHEN 500 MG PO TABS
1000.0000 mg | ORAL_TABLET | Freq: Four times a day (QID) | ORAL | Status: DC | PRN
  Administered 2017-10-15 – 2017-10-16 (×2): 1000 mg via ORAL
  Filled 2017-10-14 (×2): qty 2

## 2017-10-14 MED ORDER — SUGAMMADEX SODIUM 200 MG/2ML IV SOLN
INTRAVENOUS | Status: AC
  Filled 2017-10-14: qty 2

## 2017-10-14 MED ORDER — SUGAMMADEX SODIUM 200 MG/2ML IV SOLN
INTRAVENOUS | Status: DC | PRN
  Administered 2017-10-14: 119.8 mg via INTRAVENOUS

## 2017-10-14 MED ORDER — ONDANSETRON HCL 4 MG/2ML IJ SOLN
4.0000 mg | Freq: Once | INTRAMUSCULAR | Status: AC
  Administered 2017-10-14: 4 mg via INTRAVENOUS

## 2017-10-14 MED ORDER — LIDOCAINE HCL (PF) 1 % IJ SOLN
INTRAMUSCULAR | Status: AC
  Filled 2017-10-14: qty 5

## 2017-10-14 MED ORDER — DIPHENHYDRAMINE HCL 50 MG/ML IJ SOLN: 12.5000 mg | Freq: Four times a day (QID) | INTRAMUSCULAR | Status: DC | PRN

## 2017-10-14 MED ORDER — FENTANYL CITRATE (PF) 100 MCG/2ML IJ SOLN
INTRAMUSCULAR | Status: AC
  Filled 2017-10-14: qty 2

## 2017-10-14 MED ORDER — ARTIFICIAL TEARS OPHTHALMIC OINT
TOPICAL_OINTMENT | OPHTHALMIC | Status: AC
  Filled 2017-10-14: qty 3.5

## 2017-10-14 MED ORDER — ONDANSETRON 4 MG PO TBDP: 4.0000 mg | ORAL_TABLET | Freq: Four times a day (QID) | ORAL | Status: DC | PRN

## 2017-10-14 MED ORDER — PROPOFOL 10 MG/ML IV BOLUS
INTRAVENOUS | Status: DC | PRN
  Administered 2017-10-14: 120 mg via INTRAVENOUS

## 2017-10-14 MED ORDER — LACTATED RINGERS IV SOLN
INTRAVENOUS | Status: DC
  Administered 2017-10-14 (×3): via INTRAVENOUS

## 2017-10-14 MED ORDER — FENTANYL CITRATE (PF) 250 MCG/5ML IJ SOLN
INTRAMUSCULAR | Status: AC
  Filled 2017-10-14: qty 5

## 2017-10-14 MED ORDER — MIDAZOLAM HCL 2 MG/2ML IJ SOLN
INTRAMUSCULAR | Status: AC
  Filled 2017-10-14: qty 2

## 2017-10-14 MED ORDER — ONDANSETRON HCL 4 MG/2ML IJ SOLN
INTRAMUSCULAR | Status: AC
  Filled 2017-10-14: qty 2

## 2017-10-14 MED ORDER — FENTANYL CITRATE (PF) 100 MCG/2ML IJ SOLN
25.0000 ug | INTRAMUSCULAR | Status: DC | PRN
  Administered 2017-10-14 (×2): 50 ug via INTRAVENOUS
  Filled 2017-10-14: qty 2

## 2017-10-14 MED ORDER — EPHEDRINE SULFATE 50 MG/ML IJ SOLN
INTRAMUSCULAR | Status: AC
  Filled 2017-10-14: qty 1

## 2017-10-14 MED ORDER — ROCURONIUM BROMIDE 100 MG/10ML IV SOLN
INTRAVENOUS | Status: DC | PRN
  Administered 2017-10-14: 40 mg via INTRAVENOUS
  Administered 2017-10-14: 10 mg via INTRAVENOUS

## 2017-10-14 MED ORDER — SODIUM CHLORIDE 0.9 % IJ SOLN
INTRAMUSCULAR | Status: AC
  Filled 2017-10-14: qty 10

## 2017-10-14 MED ORDER — DIPHENHYDRAMINE HCL 12.5 MG/5ML PO ELIX: 12.5000 mg | ORAL_SOLUTION | Freq: Four times a day (QID) | ORAL | Status: DC | PRN

## 2017-10-14 MED ORDER — LIDOCAINE HCL (CARDIAC) 20 MG/ML IV SOLN
INTRAVENOUS | Status: DC | PRN
  Administered 2017-10-14: 40 mg via INTRAVENOUS

## 2017-10-14 MED ORDER — METHOCARBAMOL 500 MG PO TABS
500.0000 mg | ORAL_TABLET | Freq: Four times a day (QID) | ORAL | Status: DC | PRN
  Administered 2017-10-15 – 2017-10-16 (×3): 500 mg via ORAL
  Filled 2017-10-14 (×3): qty 1

## 2017-10-14 MED ORDER — BUPIVACAINE LIPOSOME 1.3 % IJ SUSP
INTRAMUSCULAR | Status: DC | PRN
  Administered 2017-10-14: 20 mL

## 2017-10-14 MED ORDER — OXYCODONE HCL 5 MG PO TABS
5.0000 mg | ORAL_TABLET | ORAL | Status: DC | PRN
  Administered 2017-10-15 – 2017-10-16 (×6): 5 mg via ORAL
  Filled 2017-10-14 (×6): qty 1

## 2017-10-14 MED ORDER — ONDANSETRON HCL 4 MG/2ML IJ SOLN: 4.0000 mg | Freq: Four times a day (QID) | INTRAMUSCULAR | Status: DC | PRN

## 2017-10-14 MED ORDER — MIDAZOLAM HCL 2 MG/2ML IJ SOLN
1.0000 mg | INTRAMUSCULAR | Status: DC
  Administered 2017-10-14: 2 mg via INTRAVENOUS

## 2017-10-14 MED ORDER — HEMOSTATIC AGENTS (NO CHARGE) OPTIME
TOPICAL | Status: DC | PRN
  Administered 2017-10-14: 1 via TOPICAL

## 2017-10-14 MED ORDER — PHENYLEPHRINE 40 MCG/ML (10ML) SYRINGE FOR IV PUSH (FOR BLOOD PRESSURE SUPPORT)
PREFILLED_SYRINGE | INTRAVENOUS | Status: AC
  Filled 2017-10-14: qty 10

## 2017-10-14 MED ORDER — LACTATED RINGERS IV SOLN: INTRAVENOUS | Status: DC

## 2017-10-14 MED ORDER — PROPOFOL 10 MG/ML IV BOLUS
INTRAVENOUS | Status: AC
  Filled 2017-10-14: qty 40

## 2017-10-14 MED ORDER — 0.9 % SODIUM CHLORIDE (POUR BTL) OPTIME
TOPICAL | Status: DC | PRN
  Administered 2017-10-14: 1000 mL

## 2017-10-14 MED ORDER — CEFAZOLIN SODIUM-DEXTROSE 2-4 GM/100ML-% IV SOLN
INTRAVENOUS | Status: AC
Start: 2017-10-14 — End: 2017-10-14
  Filled 2017-10-14: qty 100

## 2017-10-14 MED ORDER — BUPIVACAINE LIPOSOME 1.3 % IJ SUSP
INTRAMUSCULAR | Status: AC
Start: 2017-10-14 — End: 2017-10-14
  Filled 2017-10-14: qty 20

## 2017-10-14 MED ORDER — CEFAZOLIN SODIUM-DEXTROSE 2-4 GM/100ML-% IV SOLN
2.0000 g | INTRAVENOUS | Status: AC
  Administered 2017-10-14: 2 g via INTRAVENOUS

## 2017-10-14 MED ORDER — ROCURONIUM BROMIDE 50 MG/5ML IV SOLN
INTRAVENOUS | Status: AC
  Filled 2017-10-14: qty 1

## 2017-10-14 MED ORDER — DOCUSATE SODIUM 100 MG PO CAPS
100.0000 mg | ORAL_CAPSULE | Freq: Two times a day (BID) | ORAL | Status: DC
  Administered 2017-10-14 – 2017-10-16 (×4): 100 mg via ORAL
  Filled 2017-10-14 (×4): qty 1

## 2017-10-14 MED ORDER — SUCCINYLCHOLINE CHLORIDE 20 MG/ML IJ SOLN
INTRAMUSCULAR | Status: AC
  Filled 2017-10-14: qty 1

## 2017-10-14 MED ORDER — FENTANYL CITRATE (PF) 100 MCG/2ML IJ SOLN
INTRAMUSCULAR | Status: DC | PRN
  Administered 2017-10-14 (×4): 50 ug via INTRAVENOUS
  Administered 2017-10-14: 25 ug via INTRAVENOUS
  Administered 2017-10-14 (×4): 50 ug via INTRAVENOUS
  Administered 2017-10-14: 25 ug via INTRAVENOUS

## 2017-10-14 MED ORDER — MORPHINE SULFATE (PF) 2 MG/ML IV SOLN
2.0000 mg | INTRAVENOUS | Status: DC | PRN
  Administered 2017-10-14 – 2017-10-16 (×8): 2 mg via INTRAVENOUS
  Filled 2017-10-14 (×8): qty 1

## 2017-10-14 SURGICAL SUPPLY — 57 items
ADH SKN CLS APL DERMABOND .7 (GAUZE/BANDAGES/DRESSINGS) ×1
APPLIER CLIP 11 MED OPEN (CLIP) ×6
APPLIER CLIP 9.375 SM OPEN (CLIP) ×6
APR CLP MED 11 20 MLT OPN (CLIP) ×2
APR CLP SM 9.3 20 MLT OPN (CLIP) ×2
BAG HAMPER (MISCELLANEOUS) ×3 IMPLANT
BINDER BREAST LRG (GAUZE/BANDAGES/DRESSINGS) ×2 IMPLANT
BINDER BREAST XLRG (GAUZE/BANDAGES/DRESSINGS) IMPLANT
CHLORAPREP W/TINT 26ML (MISCELLANEOUS) ×3 IMPLANT
CLIP APPLIE 11 MED OPEN (CLIP) IMPLANT
CLIP APPLIE 9.375 SM OPEN (CLIP) IMPLANT
CLOTH BEACON ORANGE TIMEOUT ST (SAFETY) ×3 IMPLANT
COVER LIGHT HANDLE STERIS (MISCELLANEOUS) ×6 IMPLANT
DERMABOND ADVANCED (GAUZE/BANDAGES/DRESSINGS) ×2
DERMABOND ADVANCED .7 DNX12 (GAUZE/BANDAGES/DRESSINGS) IMPLANT
DRAPE PROXIMA HALF (DRAPES) IMPLANT
ELECT REM PT RETURN 9FT ADLT (ELECTROSURGICAL) ×3
ELECTRODE REM PT RTRN 9FT ADLT (ELECTROSURGICAL) ×1 IMPLANT
EVACUATOR DRAINAGE 10X20 100CC (DRAIN) ×1 IMPLANT
EVACUATOR SILICONE 100CC (DRAIN) ×6
GAUZE SPONGE 4X4 12PLY STRL (GAUZE/BANDAGES/DRESSINGS) ×3 IMPLANT
GLOVE BIO SURGEON STRL SZ 6.5 (GLOVE) ×2 IMPLANT
GLOVE BIO SURGEONS STRL SZ 6.5 (GLOVE) ×1
GLOVE BIOGEL PI IND STRL 6.5 (GLOVE) ×1 IMPLANT
GLOVE BIOGEL PI IND STRL 7.0 (GLOVE) ×1 IMPLANT
GLOVE BIOGEL PI INDICATOR 6.5 (GLOVE) ×2
GLOVE BIOGEL PI INDICATOR 7.0 (GLOVE) ×6
GLOVE ECLIPSE 6.5 STRL STRAW (GLOVE) ×2 IMPLANT
GLOVE ECLIPSE 7.0 STRL STRAW (GLOVE) ×2 IMPLANT
GLOVE SURG SS PI 7.5 STRL IVOR (GLOVE) ×3 IMPLANT
GOWN STRL REUS W/TWL LRG LVL3 (GOWN DISPOSABLE) ×11 IMPLANT
HEMOSTAT ARISTA ABSORB 3G PWDR (MISCELLANEOUS) ×2 IMPLANT
INST SET MINOR GENERAL (KITS) ×3 IMPLANT
KIT ROOM TURNOVER APOR (KITS) ×3 IMPLANT
MANIFOLD NEPTUNE II (INSTRUMENTS) ×3 IMPLANT
NEEDLE HYPO 22GX1.5 SAFETY (NEEDLE) ×3 IMPLANT
NS IRRIG 1000ML POUR BTL (IV SOLUTION) ×3 IMPLANT
PACK MINOR (CUSTOM PROCEDURE TRAY) ×3 IMPLANT
PAD ABD 5X9 TENDERSORB (GAUZE/BANDAGES/DRESSINGS) ×3 IMPLANT
PAD ARMBOARD 7.5X6 YLW CONV (MISCELLANEOUS) ×3 IMPLANT
SET BASIN LINEN APH (SET/KITS/TRAYS/PACK) ×3 IMPLANT
SPONGE DRAIN TRACH 4X4 STRL 2S (GAUZE/BANDAGES/DRESSINGS) ×3 IMPLANT
SPONGE INTESTINAL PEANUT (DISPOSABLE) ×2 IMPLANT
SPONGE LAP 18X18 X RAY DECT (DISPOSABLE) ×6 IMPLANT
STAPLER VISISTAT (STAPLE) ×3 IMPLANT
SUT ETHILON 3 0 FSL (SUTURE) ×4 IMPLANT
SUT MNCRL AB 4-0 PS2 18 (SUTURE) ×2 IMPLANT
SUT PROLENE 2 0 SH 30 (SUTURE) ×4 IMPLANT
SUT PROLENE 3 0 PS 2 (SUTURE) ×16 IMPLANT
SUT SILK 2 0 (SUTURE) ×3
SUT SILK 2 0 SH (SUTURE) ×3 IMPLANT
SUT SILK 2-0 18XBRD TIE 12 (SUTURE) ×1 IMPLANT
SUT VIC AB 2-0 CT1 27 (SUTURE) ×18
SUT VIC AB 2-0 CT1 TAPERPNT 27 (SUTURE) IMPLANT
SUT VICRYL AB 2 0 TIES (SUTURE) ×2 IMPLANT
SYR 20CC LL (SYRINGE) ×3 IMPLANT
TAPE CLOTH SURG 4X10 WHT LF (GAUZE/BANDAGES/DRESSINGS) ×2 IMPLANT

## 2017-10-14 NOTE — Anesthesia Procedure Notes (Signed)
Procedure Name: Intubation Date/Time: 10/14/2017 10:34 AM Performed by: Andree Elk, Makenzie Weisner A, CRNA Pre-anesthesia Checklist: Patient identified, Patient being monitored, Timeout performed, Emergency Drugs available and Suction available Patient Re-evaluated:Patient Re-evaluated prior to induction Oxygen Delivery Method: Circle System Utilized Preoxygenation: Pre-oxygenation with 100% oxygen Induction Type: IV induction Ventilation: Mask ventilation without difficulty Laryngoscope Size: Mac and 3 Grade View: Grade I Tube type: Oral Tube size: 7.0 mm Number of attempts: 1 Airway Equipment and Method: Stylet Placement Confirmation: ETT inserted through vocal cords under direct vision,  positive ETCO2 and breath sounds checked- equal and bilateral Secured at: 21 cm Tube secured with: Tape Dental Injury: Teeth and Oropharynx as per pre-operative assessment

## 2017-10-14 NOTE — Anesthesia Postprocedure Evaluation (Signed)
Anesthesia Post Note  Patient: Angel Hale  Procedure(s) Performed: MASTECTOMY MODIFIED RADICAL (Right Breast)  Patient location during evaluation: PACU Anesthesia Type: General Level of consciousness: patient cooperative Pain management: pain level controlled Vital Signs Assessment: post-procedure vital signs reviewed and stable Respiratory status: spontaneous breathing, respiratory function stable and patient connected to face mask oxygen Cardiovascular status: stable Postop Assessment: no apparent nausea or vomiting Anesthetic complications: no     Last Vitals:  Vitals:   10/14/17 1020 10/14/17 1400  BP: 107/69 117/70  Pulse:  92  Resp: 19 16  Temp:  37.5 C  SpO2: 100% 100%    Last Pain:  Vitals:   10/14/17 1400  TempSrc:   PainSc: Asleep                 Ramesha Poster A

## 2017-10-14 NOTE — Anesthesia Postprocedure Evaluation (Signed)
Anesthesia Post Note  Patient: Angel Hale  Procedure(s) Performed: MASTECTOMY MODIFIED RADICAL (Right Breast)  Anesthesia Type: General     Last Vitals:  Vitals:   10/14/17 1020 10/14/17 1400  BP: 107/69 117/70  Pulse:  92  Resp: 19 16  Temp:  37.5 C  SpO2: 100% 100%    Last Pain:  Vitals:   10/14/17 1400  TempSrc:   PainSc: Asleep                 [name] A

## 2017-10-14 NOTE — Transfer of Care (Signed)
Immediate Anesthesia Transfer of Care Note  Patient: Angel Hale  Procedure(s) Performed: MASTECTOMY MODIFIED RADICAL (Right Breast)  Patient Location: PACU  Anesthesia Type:General  Level of Consciousness: awake, oriented and patient cooperative  Airway & Oxygen Therapy: Patient Spontanous Breathing and Patient connected to face mask oxygen  Post-op Assessment: Report given to RN and Post -op Vital signs reviewed and stable  Post vital signs: Reviewed and stable  Last Vitals:  Vitals:   10/14/17 1015 10/14/17 1020  BP: 107/68 107/69  Resp: 20 19  Temp:    SpO2: 100% 100%    Last Pain:  Vitals:   10/14/17 0855  TempSrc: Oral  PainSc: 3       Patients Stated Pain Goal: 4 (28/20/60 1561)  Complications: No apparent anesthesia complications

## 2017-10-14 NOTE — Brief Op Note (Signed)
10/14/2017  2:24 PM  PATIENT:  Angel Hale  46 y.o. female  PRE-OPERATIVE DIAGNOSIS:  Right breast cancer  POST-OPERATIVE DIAGNOSIS: Same PROCEDURE:  Procedure(s): MASTECTOMY MODIFIED RADICAL (Right)  SURGEON:  Surgeon(s) and Role:    * Holdyn Poyser, Lanell Matar, MD - Primary    * Aviva Signs, MD - Assisting  ANESTHESIA:   general  EBL:  50 mL   BLOOD ADMINISTERED:none  DRAINS: (2) Jackson-Pratt drain(s) with closed bulb suction in the axilla and mastectomy site   LOCAL MEDICATIONS USED:  OTHER xparel  SPECIMEN:  Modified Radical Mastectomy  DISPOSITION OF SPECIMEN:  PATHOLOGY  COUNTS:  YES  DICTATION: .Note written in EPIC  PLAN OF CARE: Admit for overnight observation  PATIENT DISPOSITION:  Short Stay   Delay start of Pharmacological VTE agent (>24hrs) due to surgical blood loss or risk of bleeding: yes

## 2017-10-14 NOTE — Op Note (Signed)
[facility] Operative Note  10/14/17  Preoperative Diagnosis: Right Breast Cancer    Postoperative Diagnosis: Same   Procedure(s) Performed:  Modified radical mastectomy    Surgeon: Lanell Matar. Constance Haw, MD   Assistants: Aviva Signs, MD    Anesthesia: General endotracheal   Anesthesiologist: Dr. 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 She has undergone her neoadjuvant treatment with great response, and was seen back in my office for surgical discussion. She was discussed at tumor board after her completion MRI, and everyone agreed that a modified radical mastectomy was indicated with inclusion of her prior inframammary wound in the specimen given the possibility of residual disease. I have discussed with her and her husband the option of a plastic surgeon to aid in any reconstruction needed with the resection and to evaluate her for the options of any future cosmetic reconstruction. At this time, she wanted to get the modified radical mastectomy with plans for an altered excision of the skin and nipple areola complex due to the location of the inframammary wound.  We have discussed the possible need for skin flaps and rotation flaps to close the area.  We discussed the risk of bleeding, infection, recurrence, seroma formation, nerve injury, need for radiation following the resection, and possible need for additional chemotherapies.  We have also discussed the possibility of the wound breaking down and requiring a wound vacuum placement if we are unable to get the area closed.    Findings: Right breast and axillary contents, no obvious  disease noted    Procedure: The patient was taken to the operating room and placed supine. General endotracheal anesthesia was induced. Intravenous antibiotics were  administered per protocol.  The right chest wall, breast and axilla and upper arm were prepped and draped in the usual sterile fashion.  A JACHO approved time out was performed verifying the correct patient, location, procedure, and equipment needed.    After inspection of the breast and the inframammary skin changes that corresponded to the ulcerated lesion she had on presentation, a skin incision was made that encompassed the nipple- areola complex and the inframammary skin in a vertical direction across the breast.  Flaps were raised in the avascular planes between the subcutaneous tissue and the breast tissue from the clavicle superiorly, the sternum medially, overly the anterior rectus inferiorly to include the subcutaneous tissue in this region where her skin was involved, and the anterior border of the latissimus dorsi laterally.  The flaps were checked to make sure they were of appropriate thickness.  Hemostasis was achieved. Next the breast tissue and underlying pectoralis fascia was excised from the pectoralis major muscle, progressing from medial to lateral.  At the lateral edge of the pectoralis major muscle, the breast tissue was swung lateral and excised.    Attention was then turned to the axillary contents. Given the location of the incision for the mastectomy, a second incision was made below the hair bearing area of the axilla extending from the lateral edge of the pectoralis to the anterior edge of the latissimus dorsi.  Flaps were raised cephalad and caudad to the estimated level of  the axillary vein superiorly and the edge of the pectoralis major medially using electrocautery.  The clavipectoral fascia was then incised along the edge of the pectoralis major, and the pectoralis major and minor were freed from the surrounding  fat and nodal tissue.  Dissection progressed first under the pectoralis major and then the pectoralis minor muscle.  The muscles were retracted medially and the pectoral neurovascular bundle was identified and preserved. The Level II nodal tissue deep to the pectoralis minor was included in the dissection.  The axillary vein was identified and cleared of overlying fat. The first branch off the vein was clamped and divided with medium hemoclips.  Inferior to this the thoracodorsal nerve bundle was identified and the long thoracic was identified on the chest wall just on the edge of the latissimus dorsi.  The remaining nodal tissue between the nerves was then carefully removed, taking care to protect the nerves.  The specimen was removed.  Again the nerves were verified and all fatty and nodal tissue was removed from the axilla.  A dry laparotomy pad was placed in the axilla.    Attention was turned to the vertical elliptical excision of the mastectomy site. The skin in the inferior portion along the chest wall over the ribs just under the location of the inframammary fold was undermined to allow for additional movement of the skin and subcutaneous tissue.  After undermining inferiorly, and medial and laterally, the lower portion closed under minimal tension.  The excess skin from the breast excision was trimmed in order to allow for a slightly curvilinear closure on the vertical axis of the breast.  A JP drain was placed in the mastectomy bed. Hemostasis was confirmed. The drain was brought out medially with a stab incision and secured with a 3-0 nylon suture.  The deep dermal layer was closed with 2-0 interrupted vicryl and the skin was closed with vertical mattress 3-0 prolene sutures given the slight tension over the rib cage area.    Attention was then turned to the axilla, it was irrigated, and was noted to be hemostatic. Arista was placed into the field.  A JP drain was placed into the axillary bed and  brought through a stab incision just inferior to the axillary incision.  The clavipectoral fascia was closed with 2-0 Vicryl suture, the deep dermal layer was also closed with 2-0 Vicryl suture, and the skin was closed with a Monocryl and dermabond.    The chest was cleaned. The lateral flap of the mastectomy site was somewhat bruised but appeared viable at the end of the case.  Loose gauze and an ABD and paper tape were applied to the area and a breast binder was placed.   All counts were correct at the end of the case. The patient was awakened from anesthesia and extubate without complication.  The patient went to the PACU in stable condition.   Angel Labrum, MD [facility] [address] [place], [place] [address] [phone] (office)

## 2017-10-14 NOTE — Anesthesia Preprocedure Evaluation (Signed)
Anesthesia Evaluation  Patient identified by MRN, date of birth, ID band Patient awake    Reviewed: Allergy & Precautions, NPO status , Patient's Chart, lab work & pertinent test results  Airway Mallampati: II  TM Distance: >3 FB Neck ROM: Full    Dental no notable dental hx.    Pulmonary Current Smoker, former smoker,    Pulmonary exam normal        Cardiovascular negative cardio ROS Normal cardiovascular exam Rhythm:Regular Rate:Normal     Neuro/Psych negative neurological ROS  negative psych ROS   GI/Hepatic negative GI ROS, Neg liver ROS,   Endo/Other  negative endocrine ROS  Renal/GU negative Renal ROS  negative genitourinary   Musculoskeletal negative musculoskeletal ROS (+)   Abdominal   Peds negative pediatric ROS (+)  Hematology negative hematology ROS (+)   Anesthesia Other Findings R breast CA   Reproductive/Obstetrics negative OB ROS                             Anesthesia Physical Anesthesia Plan  ASA: II  Anesthesia Plan: General   Post-op Pain Management:    Induction: Intravenous  PONV Risk Score and Plan:   Airway Management Planned: Oral ETT  Additional Equipment:   Intra-op Plan:   Post-operative Plan: Extubation in OR  Informed Consent: I have reviewed the patients History and Physical, chart, labs and discussed the procedure including the risks, benefits and alternatives for the proposed anesthesia with the patient or authorized representative who has indicated his/her understanding and acceptance.     Plan Discussed with:   Anesthesia Plan Comments:         Anesthesia Quick Evaluation

## 2017-10-14 NOTE — Interval H&P Note (Signed)
History and Physical Interval Note:  10/14/2017 9:36 AM  Angel Hale  has presented today for surgery, with the diagnosis of right breast cancer  The various methods of treatment have been discussed with the patient and family. After consideration of risks, benefits and other options for treatment, the patient has consented to  Procedure(s): MASTECTOMY MODIFIED RADICAL (Right) as a surgical intervention .  The patient's history has been reviewed, patient examined, no change in status, stable for surgery.  I have reviewed the patient's chart and labs.  Questions were answered to the patient's satisfaction.    No additional questions. Marked. Plan for angled excision of the skin/ nipple complex to include right lower skin changes at inframammary fold. May need undermining flaps versus rotation full thickness skin flap to close.    Virl Cagey

## 2017-10-15 ENCOUNTER — Other Ambulatory Visit: Payer: Self-pay

## 2017-10-15 LAB — CBC
HCT: 32.4 % — ABNORMAL LOW (ref 36.0–46.0)
Hemoglobin: 10.5 g/dL — ABNORMAL LOW (ref 12.0–15.0)
MCH: 33.2 pg (ref 26.0–34.0)
MCHC: 32.4 g/dL (ref 30.0–36.0)
MCV: 102.5 fL — ABNORMAL HIGH (ref 78.0–100.0)
PLATELETS: 353 10*3/uL (ref 150–400)
RBC: 3.16 MIL/uL — ABNORMAL LOW (ref 3.87–5.11)
RDW: 14.2 % (ref 11.5–15.5)
WBC: 7.3 10*3/uL (ref 4.0–10.5)

## 2017-10-15 LAB — BASIC METABOLIC PANEL
Anion gap: 9 (ref 5–15)
BUN: 7 mg/dL (ref 6–20)
CALCIUM: 8.7 mg/dL — AB (ref 8.9–10.3)
CO2: 27 mmol/L (ref 22–32)
CREATININE: 0.61 mg/dL (ref 0.44–1.00)
Chloride: 103 mmol/L (ref 101–111)
GFR calc non Af Amer: >60 mL/min (ref >60)
Glucose, Bld: 106 mg/dL — ABNORMAL HIGH (ref 65–99)
Potassium: 3.4 mmol/L — ABNORMAL LOW (ref 3.5–5.1)
SODIUM: 139 mmol/L (ref 135–145)

## 2017-10-15 MED ORDER — HEPARIN SODIUM (PORCINE) 5000 UNIT/ML IJ SOLN
5000.0000 [IU] | Freq: Three times a day (TID) | INTRAMUSCULAR | Status: DC
  Administered 2017-10-15 – 2017-10-16 (×3): 5000 [IU] via SUBCUTANEOUS
  Filled 2017-10-15 (×3): qty 1

## 2017-10-15 NOTE — Evaluation (Signed)
Physical Therapy Evaluation Patient Details Name: Angel Hale MRN: [id number] DOB: 08-28-1970 Today's Date: 10/15/2017   History of Present Illness  Linsey Cunning is a 47 y/o female, s/p RIGHT s/p MASTECTOMY MODIFIED RADICAL with the diagnosis of right breast cancer    Clinical Impression  Patient has difficulty sitting up in bed mostly due to increasing chest wall pain at surgical site, demonstrates good return for ambulating in hallways and going up/down stairs without loss of balance with hand held assistance.  Patient instructed in RUE ROM exercises to avoid contractures in shoulder and chest area and given written HEP and instructions for post op breast mastectomies.  Patient will benefit from continued physical therapy in hospital and recommended venue below to increase strength, balance, endurance for safe ADLs and gait.    Follow Up Recommendations Home health PT;Supervision for mobility/OOB    Equipment Recommendations  None recommended by PT    Recommendations for Other Services       Precautions / Restrictions Precautions Precautions: Fall Restrictions Weight Bearing Restrictions: No      Mobility  Bed Mobility Overal bed mobility: Needs Assistance Bed Mobility: Supine to Sit;Sit to Supine     Supine to sit: Mod assist Sit to supine: Min assist   General bed mobility comments: has difficulty sitting up with bed flat due to increase right chest wall pain when moving RUE  Transfers Overall transfer level: Needs assistance Equipment used: None;1 person hand held assist Transfers: Sit to/from [facility] Sit to Stand: Min guard Stand pivot transfers: Min guard          Ambulation/Gait Ambulation/Gait assistance: Min guard Ambulation Distance (Feet): 100 Feet Assistive device: None;1 person hand held assist Gait Pattern/deviations: WFL(Within Functional Limits)   Gait velocity interpretation: Below normal speed for age/gender General  Gait Details: grossly WFL except limited RUE armswing due to pain, slow slightly labored cadence  Stairs Stairs: Yes Stairs assistance: Min guard Stair Management: One rail Left;Step to pattern Number of Stairs: 9 General stair comments: demonstrates good return for going up/down stairs using 1 siderail and hand held assist without loss of balance  Wheelchair Mobility    Modified Rankin (Stroke Patients Only)       Balance Overall balance assessment: No apparent balance deficits (not formally assessed)                                           Pertinent Vitals/Pain Pain Assessment: 0-10 Pain Score: 6  Pain Location: right side chest wall at surgical site Pain Descriptors / Indicators: Aching;Discomfort Pain Intervention(s): Premedicated before session;Limited activity within patient's tolerance;Monitored during session    Home Living Family/patient expects to be discharged to:: Private residence Living Arrangements: Spouse/significant other Available Help at Discharge: Family;Available PRN/intermittently Type of Home: House Home Access: Stairs to enter Entrance Stairs-Rails: Right Entrance Stairs-Number of Steps: 2 Home Layout: Two level Home Equipment: Shower seat - built in Additional Comments: 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 Very active    Prior Function Level of Independence: Independent               Hand Dominance   Dominant Hand: Right    Extremity/Trunk Assessment   Upper Extremity Assessment Upper Extremity Assessment: RUE deficits/detail;Generalized weakness RUE Deficits / Details: grossly -3/5, limited to 80 degrees of shoulder flexion/abduction due to increasing pain    Lower Extremity Assessment  Lower Extremity Assessment: Overall WFL for tasks assessed    Cervical / Trunk Assessment Cervical / Trunk Assessment: Normal  Communication   Communication: No difficulties  Cognition Arousal/Alertness: Awake/alert Behavior  During Therapy: WFL for tasks assessed/performed Overall Cognitive Status: Within Functional Limits for tasks assessed                                        General Comments      Exercises Other Exercises Other Exercises: Patient instructed in right shoulder ROM exercises to prevent chest contractures tightness with understanding acknowleged and written instructions provided.   Assessment/Plan    PT Assessment Patient needs continued PT services  PT Problem List Decreased range of motion;Decreased strength;Decreased activity tolerance;Decreased mobility       PT Treatment Interventions Gait training;Stair training;Therapeutic activities;Therapeutic exercise;Patient/family education    PT Goals (Current goals can be found in the Care Plan section)  Acute Rehab PT Goals Patient Stated Goal: return home PT Goal Formulation: With patient/family Time For Goal Achievement: 10/17/17 Potential to Achieve Goals: Good    Frequency Min 5X/week   Barriers to discharge        Co-evaluation               AM-PAC PT "6 Clicks" Daily Activity  Outcome Measure Difficulty turning over in bed (including adjusting bedclothes, sheets and blankets)?: A Little Difficulty moving from lying on back to sitting on the side of the bed? : A Little Difficulty sitting down on and standing up from a chair with arms (e.g., wheelchair, bedside commode, etc,.)?: A Little Help needed moving to and from a bed to chair (including a wheelchair)?: A Little Help needed walking in hospital room?: A Little Help needed climbing 3-5 steps with a railing? : A Little 6 Click Score: 18    End of Session   Activity Tolerance: Patient tolerated treatment well;Patient limited by pain Patient left: in bed;with call bell/phone within reach;with family/visitor present Nurse Communication: Mobility status PT Visit Diagnosis: Unsteadiness on feet (R26.81);Other abnormalities of gait and mobility  (R26.89);Muscle weakness (generalized) (M62.81)    Time: 8413-2440 PT Time Calculation (min) (ACUTE ONLY): 28 min   Charges:   PT Evaluation $PT Eval Moderate Complexity: 1 Mod PT Treatments $Therapeutic Activity: 23-37 mins   PT G Codes:        11:50 AM, 11/06/17 Lonell Grandchild, MPT Physical Therapist with [facility] 336 [phone] office [phone] mobile phone

## 2017-10-15 NOTE — Anesthesia Postprocedure Evaluation (Signed)
Anesthesia Post Note  Patient: Angel Hale  Procedure(s) Performed: MASTECTOMY MODIFIED RADICAL (Right Breast)  Patient location during evaluation: Nursing Unit Anesthesia Type: General Level of consciousness: awake and alert and oriented Pain management: pain level controlled Vital Signs Assessment: post-procedure vital signs reviewed and stable Respiratory status: spontaneous breathing Cardiovascular status: blood pressure returned to baseline Postop Assessment: no apparent nausea or vomiting and adequate PO intake Anesthetic complications: no     Last Vitals:  Vitals:   10/15/17 0400 10/15/17 1000  BP: 117/73 110/67  Pulse: 68 70  Resp: 20 18  Temp: 37.2 C 36.8 C  SpO2: 100% 98%    Last Pain:  Vitals:   10/15/17 1000  TempSrc: Oral  PainSc:                  Makahla Kiser

## 2017-10-15 NOTE — Addendum Note (Signed)
Addendum  created 10/15/17 1100 by Ollen Bowl, CRNA   Sign clinical note

## 2017-10-15 NOTE — Progress Notes (Signed)
[facility] Progress Note  1 Day Post-Op  Subjective: No major complaints. PT has worked with her today. Having some pain and limited motion of the arm/ chest.   Objective: Vital signs in last 24 hours: Temp:  [98.3 F (36.8 C)-99.5 F (37.5 C)] 98.3 F (36.8 C) (02/21 1000) Pulse Rate:  [61-92] 70 (02/21 1000) Resp:  [16-30] 18 (02/21 1000) BP: (105-125)/(62-78) 110/67 (02/21 1000) SpO2:  [98 %-100 %] 98 % (02/21 1000)    Intake/Output from previous day: 02/20 0701 - 02/21 0700 In: 1300 [I.V.:1300] Out: 83 [Drains:33; Blood:50] Intake/Output this shift: No intake/output data recorded.  General appearance: alert, cooperative and mild distress Resp: normal work breathing Chest wall: no tenderness, right chest mastectomy scar c/d/i with minimal bleeding from central portion, lateral skin with bruising, tender, JP with some bloody output, minimal in drains, axilla incision c/d/i with dermabond Extremities: extremities normal, atraumatic, no cyanosis or edema    Lab Results:  Recent Labs    10/15/17 0647  WBC 7.3  HGB 10.5*  HCT 32.4*  PLT 353   BMET Recent Labs    10/15/17 0647  NA 139  K 3.4*  CL 103  CO2 27  GLUCOSE 106*  BUN 7  CREATININE 0.61  CALCIUM 8.7*   Assessment/Plan: Ms. Ries is a 47 yo POD 1 s/p MASTECTOMY MODIFIED RADICAL. She is doing fair but is still requiring IV pain medications. -PRN for pain, encouraged trying oral meds to see if will be ok for going home -IS, OOB -Diet as tolerated -Labs wnl  -Mastectomy skin flap with some bruising, at this time difficult to say if any ischemic changes, will need to monitor, have warned Lecretia and Mark regarding possibility of some skin lose in the area due to the flap being thinner given her overall body habitus, will monitor closely -JP drains emptied q shift and record axilla #1 separate from breast # 2   -ABD to mastectomy incision daily, breast binder in place, no tape  -Will  likely go home tomorrow once pain better under control and can monitor flap another day    LOS: 0 days   Virl Cagey 10/15/2017

## 2017-10-15 NOTE — Care Management (Signed)
Patient sleeping. CM discussed option of HH PT with husband. They will discuss and I will check with them tomorrow.

## 2017-10-15 NOTE — Plan of Care (Signed)
  Acute Rehab PT Goals(only PT should resolve) Pt Will Go Supine/Side To Sit 10/15/2017 1152 - Progressing by Lonell Grandchild, PT Flowsheets Taken 10/15/2017 1152  Pt will go Supine/Side to Sit with min guard assist Patient Will Transfer Sit To/From Stand 10/15/2017 1152 - Progressing by Lonell Grandchild, PT Flowsheets Taken 10/15/2017 1152  Patient will transfer sit to/from stand with supervision Pt Will Transfer Bed To Chair/Chair To Bed 10/15/2017 1152 - Progressing by Lonell Grandchild, PT Flowsheets Taken 10/15/2017 1152  Pt will Transfer Bed to Chair/Chair to Bed with supervision Pt Will Ambulate 10/15/2017 1152 - Progressing by Lonell Grandchild, PT Flowsheets Taken 10/15/2017 1152  Pt will Ambulate with supervision;> 125 feet Note Without assistive device  11:54 AM, 10/15/17 Lonell Grandchild, MPT Physical Therapist with [facility] 336 [phone] office [phone] mobile phone

## 2017-10-15 NOTE — Care Management Note (Signed)
Case Management Note  Patient Details  Name: Angel Hale MRN: [id number] Date of Birth: 1971-03-23  Subjective/Objective:    S/p day 1 mastectomy. Significant other at bedside. She does not have insurance or PCP. 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 She does not have a PCP. We discuss Health department if needed. She plans to follow up with Dr. Constance Haw after DC.  We discuss Home health. She is not interested. Her significant other plans to take care of her and help with dressing changes.                Action/Plan: DC home with self care. Financial counselor will take with patient also.  Patient reports she has already applied for financial assistance.   Expected Discharge Date:     10/16/2017             Expected Discharge Plan:  Home/Self Care  In-House Referral:     Discharge planning Services  CM Consult  Post Acute Care Choice:    Choice offered to:  Patient  DME Arranged:    DME Agency:     HH Arranged:  Patient Refused [place] Agency:     Status of Service:  Completed, signed off  If discussed at [employer] of Stay Meetings, dates discussed:    Additional Comments:  [name], [name], RN 10/15/2017, 11:34 AM

## 2017-10-16 ENCOUNTER — Telehealth: Payer: Self-pay | Admitting: General Surgery

## 2017-10-16 ENCOUNTER — Encounter (HOSPITAL_COMMUNITY): Payer: Self-pay | Admitting: General Surgery

## 2017-10-16 MED ORDER — DOCUSATE SODIUM 100 MG PO CAPS: 100.0000 mg | ORAL_CAPSULE | Freq: Two times a day (BID) | ORAL | 0 refills | Status: DC | PRN

## 2017-10-16 MED ORDER — OXYCODONE HCL 5 MG PO TABS
5.0000 mg | ORAL_TABLET | Freq: Once | ORAL | Status: AC
  Administered 2017-10-16: 5 mg via ORAL
  Filled 2017-10-16: qty 1

## 2017-10-16 MED ORDER — OXYCODONE HCL 5 MG PO TABS: 5.0000 mg | ORAL_TABLET | ORAL | 0 refills | Status: DC | PRN

## 2017-10-16 NOTE — Telephone Encounter (Signed)
[facility]  Pathology with no residual disease and no disease in lymph nodes.   No answer on Naelle's phone. And voicemail full. Mark's phone went to voicemail and I left a message to tell them that the specimen was cancer free.  Curlene Labrum, MD [facility] [address] [place], [place] [address] [phone] (office)

## 2017-10-16 NOTE — Progress Notes (Signed)
Physical Therapy Treatment Patient Details Name: Angel Hale MRN: [id number] DOB: 09/28/1970 Today's Date: 10/16/2017    History of Present Illness Angel Hale is a 47 y/o female, s/p RIGHT s/p MASTECTOMY MODIFIED RADICAL with the diagnosis of right breast cancer    PT Comments    Patient demonstrates good return for completing exercises and increased endurance/tolerance for gait and moving RUE.  Plan:  Patient discharged from physical therapy secondary to going home today.  Follow Up Recommendations  Outpatient PT     Equipment Recommendations  None recommended by PT    Recommendations for Other Services       Precautions / Restrictions Precautions Precautions: Fall Restrictions Weight Bearing Restrictions: No    Mobility  Bed Mobility Overal bed mobility: Needs Assistance Bed Mobility: Supine to Sit;Sit to Supine     Supine to sit: Min guard Sit to supine: Supervision   General bed mobility comments: demonstrates improvement for using abdominal to for supine to sit  Transfers Overall transfer level: Needs assistance Equipment used: None Transfers: Sit to/from [facility] Sit to Stand: Supervision Stand pivot transfers: Supervision          Ambulation/Gait Ambulation/Gait assistance: Supervision Ambulation Distance (Feet): 150 Feet Assistive device: None Gait Pattern/deviations: WFL(Within Functional Limits)   Gait velocity interpretation: Below normal speed for age/gender General Gait Details: demonstrates improvement for RUE armswing, no loss of balance and less c/o pain   Stairs            Wheelchair Mobility    Modified Rankin (Stroke Patients Only)       Balance Overall balance assessment: No apparent balance deficits (not formally assessed)                                          Cognition Arousal/Alertness: Awake/alert Behavior During Therapy: WFL for tasks assessed/performed Overall  Cognitive Status: Within Functional Limits for tasks assessed                                        Exercises Other Exercises Other Exercises: supine right shoulder abduction x 10 reps Other Exercises: seated self assisted right shoulder flexion using LUE X 10 REPS Other Exercises: seated bilateral shoulder external rotation x 10 reps Other Exercises: seated bilateral shoulder extension x 10 reps    General Comments        Pertinent Vitals/Pain Pain Assessment: 0-10 Pain Score: 7  Pain Location: right side chest wall at surgical site Pain Descriptors / Indicators: Aching;Discomfort    Home Living                      Prior Function            PT Goals (current goals can now be found in the care plan section) Acute Rehab PT Goals Patient Stated Goal: return home PT Goal Formulation: With patient/family Time For Goal Achievement: 10/16/17 Potential to Achieve Goals: Good Progress towards PT goals: Progressing toward goals    Frequency           PT Plan Current plan remains appropriate    Co-evaluation              AM-PAC PT "6 Clicks" Daily Activity  Outcome Measure  Difficulty turning over in bed (including  adjusting bedclothes, sheets and blankets)?: A Little Difficulty moving from lying on back to sitting on the side of the bed? : A Little Difficulty sitting down on and standing up from a chair with arms (e.g., wheelchair, bedside commode, etc,.)?: None Help needed moving to and from a bed to chair (including a wheelchair)?: None Help needed walking in hospital room?: None Help needed climbing 3-5 steps with a railing? : A Little 6 Click Score: 21    End of Session   Activity Tolerance: Patient tolerated treatment well Patient left: in bed;with call bell/phone within reach;with family/visitor present(seated at bedside) Nurse Communication: Mobility status PT Visit Diagnosis: Unsteadiness on feet (R26.81);Other  abnormalities of gait and mobility (R26.89);Muscle weakness (generalized) (M62.81)     Time: 8841-6606 PT Time Calculation (min) (ACUTE ONLY): 22 min  Charges:  $Therapeutic Activity: 8-22 mins                    G Codes:       12:18 PM, October 17, 2017 Lonell Grandchild, MPT Physical Therapist with [facility] [phone] office [phone] mobile phone

## 2017-10-16 NOTE — Progress Notes (Signed)
Reviewed discharge instructions with patient and husband. Incision and JP management discussed and demonstrated with husband. Husband was able to demonstrate management of JP and dressing changes.  Patient discharged home with husband.

## 2017-10-16 NOTE — Discharge Instructions (Signed)
Discharge Instructions: Shower per your regular routine, keep your drains dry and your incisions dry as much as possible. Water can run over the incision, but then pat dry.  Keep a pad in the area over your incision daily.  Take tylenol and ibuprofen as needed for pain control, alternating every 4-6 hours.  Take Roxicodone for breakthrough pain. Take colace for constipation related to narcotic pain medication. Do not pick at the sutures or dermabond glue on your incision sites.  Keep a record of your JP drains daily, recording the character (color) and amount from Drain #1 and Drain #2.  Surgical [facility] Care Surgical drains are used to remove extra fluid that normally builds up in a surgical wound after surgery. A surgical drain helps to heal a surgical wound. Different kinds of surgical drains include:  Active drains. These drains use suction to pull drainage away from the surgical wound. Drainage flows through a tube to a container outside of the body. It is important to keep the bulb or the drainage container flat (compressed) at all times, except while you empty it. Flattening the bulb or container creates suction. The two most common types of active drains are bulb drains and Hemovac drains.  A drain is placed during surgery. Immediately after surgery, drainage is usually bright red and a little thicker than water. The drainage may gradually turn yellow or pink and become thinner. It is likely that your health care provider will remove the drain when the drainage stops or when the amount decreases to 1-2 Tbsp (15-30 mL) during a 24-hour period. How to care for your surgical drain  Keep the skin around the drain dry and covered with a dressing at all times.  Check your drain area every day for signs of infection. Check for: ? More redness, swelling, or pain. ? Pus or a bad smell. ? Cloudy drainage. Follow instructions from your health care provider about how to take care of your drain  and how to change your dressing. Change your dressing at least one time every day. Change it more often if needed to keep the dressing dry. Make sure you: 1. Gather your supplies, including: ? Tape. ? Germ-free cleaning solution (sterile saline). ? Split gauze drain sponge: 4 x 4 inches (10 x 10 cm). ? Gauze square: 4 x 4 inches (10 x 10 cm). 2. Wash your hands with soap and water before you change your dressing. If soap and water are not available, use hand sanitizer. 3. Remove the old dressing. Avoid using scissors to do that. 4. Use sterile saline to clean your skin around the drain. 5. Place the tube through the slit in a drain sponge. Place the drain sponge so that it covers your wound. 6. Place the gauze square or another drain sponge on top of the drain sponge that is on the wound. Make sure the tube is between those layers. 7. Tape the dressing to your skin. 8. If you have an active bulb or Hemovac drain, tape the drainage tube to your skin 1-2 inches (2.5-5 cm) below the place where the tube enters your body. Taping keeps the tube from pulling on any stitches (sutures) that you have. 9. Wash your hands with soap and water. 10. Write down the color of your drainage and how often you change your dressing.  How to empty your active bulb or Hemovac drain 1. Make sure that you have a measuring cup that you can empty your drainage into. 2. Wash  your hands with soap and water. If soap and water are not available, use hand sanitizer. 3. Gently move your fingers down the tube while squeezing very lightly. This is called stripping the tube. This clears any drainage, clots, or tissue from the tube. ? Do not pull on the tube. ? You may need to strip the tube several times every day to keep the tube clear. 4. Open the bulb cap or the drain plug. Do not touch the inside of the cap or the bottom of the plug. 5. Empty all of the drainage into the measuring cup. 6. Compress the bulb or the container  and replace the cap or the plug. To compress the bulb or the container, squeeze it firmly in the middle while you close the cap or plug the container. 7. Write down the amount of drainage that you have in each 24-hour period. If you have less than 2 Tbsp (30 mL) of drainage during 24 hours, contact your health care provider. 8. Flush the drainage down the toilet. 9. Wash your hands with soap and water. Contact a health care provider if:  You have more redness, swelling, or pain around your drain area.  The amount of drainage that you have is increasing instead of decreasing.  You have pus or a bad smell coming from your drain area.  You have a fever.  You have drainage that is cloudy.  There is a sudden stop or a sudden decrease in the amount of drainage that you have.  Your tube falls out.  Your active draindoes not stay compressedafter you empty it. This information is not intended to replace advice given to you by your health care provider. Make sure you discuss any questions you have with your health care provider. Document Released: 08/08/2000 Document Revised: 01/17/2016 Document Reviewed: 02/28/2015 Elsevier Interactive Patient Education  2018 [employer].   Total or Modified Radical Mastectomy, Care After Refer to this sheet in the next few weeks. These instructions provide you with information about caring for yourself after your procedure. Your health care provider may also give you more specific instructions. Your treatment has been planned according to current medical practices, but problems sometimes occur. Call your health care provider if you have any problems or questions after your procedure. What can I expect after the procedure? After your procedure, it is common to have:  Pain.  Numbness.  Stiffness in your arm or shoulder.  Feelings of stress, sadness, or depression.  If the lymph nodes under your arm were removed, you may have arm swelling, weakness,  or numbness on the same side of your body as your surgery. Follow these instructions at home: Incision care  There are many different ways to close and cover an incision, including stitches, skin glue, and adhesive strips. Follow your health care provider's instructions about: ? Incision care. ? Bandage (dressing) changes and removal. ? Incision closure removal.  Check your incision area every day for signs of infection. Watch for: ? Redness, swelling, or pain. ? Fluid, blood, or pus.  If you were sent home with a surgical drain in place, follow your health care provider's instructions for emptying it. Bathing  Do not take baths, swim, or use a hot tub until your health care provider approves.  Take sponge baths until your health care provider says that you can start showering or bathing. Activity  Return to your normal activities as directed by your health care provider.  Avoid strenuous exercise.  Be careful  to avoid any activities that could cause an injury to your arm on the side of your surgery.  Do not lift anything that is heavier than 10 lb (4.5 kg). Avoid lifting with the arm that is on the side of your surgery.  Do not carry heavy objects on your shoulder.  After your drain is removed, you should perform exercises to keep your arm from getting stiff and swollen. Talk with your health care provider about which exercises are safe for you. General instructions  Take medicines only as directed by your health care provider.  You may eat what you usually do.  Keep your arm elevated when at rest.  Do not wear tight jewelry on your arm, wrist, or fingers on the side of your surgery.  Get checked for extra fluid around your lymph nodes (lymphedema) as often as told by your health care provider.  If you had a modified radical mastectomy, always let your health care providers know that lymph nodes under your arm were removed. This is important information to share before  you are involved in certain procedures, such as giving blood or having your blood pressure taken. Contact a health care provider if:  You have a fever.  Your pain medicine is not working.  Your arm swelling, weakness, or numbness has not improved after a few weeks.  You have new swelling in your breast or arm.  You have redness, swelling, or pain in your incision area.  You have fluid, blood, or pus coming from your incision. Get help right away if:  You have very bad pain in your breast or arm.  You have chest pain.  You have difficulty breathing. This information is not intended to replace advice given to you by your health care provider. Make sure you discuss any questions you have with your health care provider. Document Released: 04/03/2004 Document Revised: 04/17/2016 Document Reviewed: 04/26/2014 Elsevier Interactive Patient Education  2018 [employer].

## 2017-10-16 NOTE — Care Management (Signed)
Discussed HH options with patient.  She has been doing exercises at home and plans to continue them. Feels comfortable going home without [facility] or RN.

## 2017-10-16 NOTE — Discharge Summary (Signed)
Physician Discharge Summary  Patient ID: Angel Hale MRN: [id number] DOB/AGE: [birth year] 47 y.o.  Admit date: 10/14/2017 Discharge date: 10/16/2017  Admission Diagnoses:  Discharge Diagnoses:  Active Problems:   Breast cancer [facility])   Discharged Condition: good  Hospital Course: Angel Hale is a 47 yo with breast cancer s/p right modified radical mastectomy. She did fair post operatively, but did have some pain control issues requiring IV pain medications. She was instructed on her JP drain care, and was seen by PT to help with mobility and post mastectomy exercises.   Consults: Physical Therapy  Treatments: Right Modified Radical Mastectomy  Pathology: Diagnosis Breast, modified radical mastectomy , right, with axillary contents - NO RESIDUAL CARCINOMA IDENTIFIED. - RESECTION MARGINS ARE NEGATIVE. - FIBROCYSTIC CHANGE AND FIBROADENOMATOID CHANGE. - BIOPSY SITE. - NO CARCINOMA IN ELEVEN OF ELEVEN LYMPH NODES (0/11). - SEE ONCOLOGY TABLE. Microscopic Comment BREAST, STATUS POST NEOADJUVANT TREATMENT Procedure: Right modified radical mastectomy. Laterality: Right. Tumor Size: No residual tumor. 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 Grade: See biopsy [id number]. Tubular Differentiation: Nuclear Pleomorphism: Mitotic Count: Ductal Carcinoma in Situ (DCIS): Not identified. Regional Lymph Nodes: Number of Lymph Nodes Examined: 11 Number of Sentinel Lymph Nodes Examined: 0 Lymph Nodes with Macrometastases: 0 Lymph Nodes with Micrometastases: 0 Lymph Nodes with Isolated Tumor Cells: 0 Margins: Invasive carcinoma, distance from closest margin: N/A. DCIS, distance from closest margin: N/A. Extent of Tumor: N/A. Breast Prognostic Profile (pre-neoadjuvant case #: [id number]) Estrogen Receptor: Positive (80%). Progesterone Receptor: Positive (10-50%). 1 of 3 FINAL for [facility], [place] [phone]) Microscopic Comment(continued) Her2:  Positive. Ki-67: 80-90% Will not be repeated due to lack of tumor. Residual Cancer Burden (RCB): Complete pathologic response. Pathologic Stage Classification (p TNM, AJCC 8th Edition): Primary Tumor (ypT): ypT0 Regional Lymph Nodes (ypN): ypN0  Discharge Exam: Blood pressure 112/72, pulse 64, temperature 98.2 F (36.8 C), temperature source Oral, resp. rate 13, height '5\' 5"'  (1.651 m), weight 132 lb (59.9 kg), SpO2 100 %. General appearance: alert, cooperative and no distress Resp: normal work of breathing Breasts: normal appearance, no masses or tenderness, right mastectomy site with continued bruising, incision c/d/i with sutures, no draniage or erythema, drains with ss output, Axilla with dermabond, no erythema or drainage GI: soft, non-tender; bowel sounds normal; no masses,  no organomegaly Extremities: extremities normal, atraumatic, no cyanosis or edema    Disposition: 01-Home or Self Care  Discharge Instructions    Call MD for:  difficulty breathing, headache or visual disturbances   Complete by:  As directed    Call MD for:  extreme fatigue   Complete by:  As directed    Call MD for:  hives   Complete by:  As directed    Call MD for:  persistant dizziness or light-headedness   Complete by:  As directed    Call MD for:  persistant nausea and vomiting   Complete by:  As directed    Call MD for:  redness, tenderness, or signs of infection (pain, swelling, redness, odor or green/yellow discharge around incision site)   Complete by:  As directed    Call MD for:  severe uncontrolled pain   Complete by:  As directed    Call MD for:  temperature >100.4   Complete by:  As directed    Diet - low sodium heart healthy   Complete by:  As directed    Increase activity slowly   Complete by:  As directed      Allergies as  of 10/16/2017      Reactions   Penicillins Nausea And Vomiting   Has patient had a PCN reaction causing immediate rash, facial/tongue/throat swelling, SOB or  lightheadedness with hypotension: No Has patient had a PCN reaction causing severe rash involving mucus membranes or skin necrosis: No Has patient had a PCN reaction that required hospitalization: No Has patient had a PCN reaction occurring within the last 10 years: No If all of the above answers are "NO", then may proceed with Cephalosporin use.      Medication List    TAKE these medications   acetaminophen 500 MG tablet Commonly known as:  TYLENOL Take 1,000 mg by mouth every 6 (six) hours as needed (for pain.).   B-12 PO Take 1 tablet by mouth daily.   CO Q-10 PO Take 1 tablet by mouth daily.   docusate sodium 100 MG capsule Commonly known as:  COLACE Take 1 capsule (100 mg total) by mouth 2 (two) times daily as needed for mild constipation.   FRANKINCENSE UPLIFTING Oil Take 1 capsule by mouth daily as needed (for home remedy).   gabapentin 100 MG capsule Commonly known as:  NEURONTIN Take 1 capsule daily on first day, then 2 times a day second day, then 3 times a day from there out.   HERCEPTIN IV Inject into the vein. Every 3 weeks   LAVENDER OIL PO Take 1 capsule by mouth daily as needed (for home remedy).   lidocaine-prilocaine cream Commonly known as:  EMLA Apply to affected area once   LORazepam 0.5 MG tablet Commonly known as:  ATIVAN Take 1 tablet (0.5 mg total) by mouth every 6 (six) hours as needed (Nausea or vomiting).   methocarbamol 500 MG tablet Commonly known as:  ROBAXIN Take 1 tablet (500 mg total) by mouth every 6 (six) hours as needed for muscle spasms.   ondansetron 8 MG tablet Commonly known as:  ZOFRAN Take 1 tablet (8 mg total) by mouth 2 (two) times daily as needed for refractory nausea / vomiting. Start on day 3 after chemo.   oxyCODONE 5 MG immediate release tablet Commonly known as:  Oxy IR/ROXICODONE Take 1-2 tablets (5-10 mg total) by mouth every 4 (four) hours as needed for severe pain or breakthrough pain.   PERJETA IV Inject  into the vein. Every 3 weeks   prochlorperazine 10 MG tablet Commonly known as:  COMPAZINE Take 1 tablet (10 mg total) by mouth every 6 (six) hours as needed (Nausea or vomiting).   vitamin C 500 MG tablet Commonly known as:  ASCORBIC ACID Take 1 tablet (500 mg total) by mouth daily.   ZINC PO Take 1 capsule by mouth daily.      Follow-up Information    Virl Cagey, MD Follow up on 10/20/2017.   Specialty:  General Surgery Contact information: [address] [place] [address] [phone]           Signed: Virl Cagey 10/16/2017, 9:14 AM

## 2017-10-20 ENCOUNTER — Encounter: Payer: Self-pay | Admitting: General Surgery

## 2017-10-20 ENCOUNTER — Ambulatory Visit (INDEPENDENT_AMBULATORY_CARE_PROVIDER_SITE_OTHER): Payer: Self-pay | Admitting: General Surgery

## 2017-10-20 VITALS — BP 115/69 | HR 72 | Temp 97.8°F | Resp 18 | Ht 65.0 in | Wt 131.0 lb

## 2017-10-20 DIAGNOSIS — C50811 Malignant neoplasm of overlapping sites of right female breast: Secondary | ICD-10-CM

## 2017-10-20 DIAGNOSIS — Z170 Estrogen receptor positive status [ER+]: Secondary | ICD-10-CM

## 2017-10-20 NOTE — Patient Instructions (Signed)
Continue drain care. Keep dressing in place as needed for drainage from prior drain site.

## 2017-10-20 NOTE — Progress Notes (Signed)
[facility] Note   HPI:  47 y.o. Female presents to clinic for post-op follow-up evaluation after a right modified radial mastectomy. Patient reports she has had some pain and had to take the roxicodone. She is feeling better and is moving her arm and doing the exercises per the PT instructions. She denies any fevers or chills.   Drain #1 - axilla, with SS output, about 10cc / day; 20cc in bulb today on visit Drain #2- mastectomy site, with SS output, about 10cc/ day; < 15cc in the bulb today   Review of Systems:  No fevers or chills Some pain in the armpit and at the incision Drainage SS  All other review of systems: otherwise negative   Pathology: Diagnosis Breast, modified radical mastectomy , right, with axillary contents - NO RESIDUAL CARCINOMA IDENTIFIED. - RESECTION MARGINS ARE NEGATIVE. - FIBROCYSTIC CHANGE AND FIBROADENOMATOID CHANGE. - BIOPSY SITE. - NO CARCINOMA IN ELEVEN OF ELEVEN LYMPH NODES (0/11). - SEE ONCOLOGY TABLE. Microscopic Comment BREAST, STATUS POST NEOADJUVANT TREATMENT Procedure: Right modified radical mastectomy. Laterality: Right. Tumor Size: No residual tumor. 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 Grade: See biopsy [id number]. Tubular Differentiation: Nuclear Pleomorphism: Mitotic Count: Ductal Carcinoma in Situ (DCIS): Not identified. Regional Lymph Nodes: Number of Lymph Nodes Examined: 11 Number of Sentinel Lymph Nodes Examined: 0 Lymph Nodes with Macrometastases: 0 Lymph Nodes with Micrometastases: 0 Lymph Nodes with Isolated Tumor Cells: 0 Margins: Invasive carcinoma, distance from closest margin: N/A. DCIS, distance from closest margin: N/A. Extent of Tumor: N/A. Breast Prognostic Profile (pre-neoadjuvant case #: [id number]) Estrogen Receptor: Positive (80%). Progesterone Receptor: Positive (10-50%).  Vital Signs:  BP 115/69   Pulse 72   Temp 97.8 F (36.6 C)   Resp 18   Ht 5\' 5"   (1.651 m)   Wt 131 lb (59.4 kg)   BMI 21.80 kg/m    Physical Exam:  Physical Exam  Constitutional: She is oriented to person, place, and time and well-developed, well-nourished, and in no distress.  HENT:  Head: Normocephalic.  Eyes: Pupils are equal, round, and reactive to light.  Neck: Normal range of motion.  Cardiovascular: Normal rate.  Pulmonary/Chest: Effort normal.  Right chest incisions c/d/i with sutures, every other suture removed, no erythema or drainage, has bruising in the lateral upper portion, improved since discharge, now more red/ yellow and less dark black,  mastectomy drain with SS output, striped and minimal more returned, mastectomy drain #2 removed; dry dressing placed; axilla/ armpit incisions c/d/i with dermabond, no erythema or drainage, drain # 1 with SS output, stripped   Musculoskeletal: Normal range of motion.  Neurological: She is alert and oriented to person, place, and time.  Vitals reviewed.     Laboratory studies: None   Imaging:  None    Assessment:  47 y.o. yo Female s/p right modified mastectomy. Pathology with all residual disease removed. Only 11 lymph nodes harvested, discussed this with the patient and think that some of it could be from neoadjuvant therapy and some from her small size/ stature.  I discussed with her that we removed everything in level I and II under the pectoralis and between the nerves and what could be removed from level III that was appreciate. Nothing was palpable or obviously left in the axilla. Drain from mastectomy site and sutures removed.  Plan:  - Follow up Thursday for axilla drain check and further suture removal     All of the above recommendations were discussed with the  patient and patient's family, and all of patient's and family's questions were answered to their expressed satisfaction.  Curlene Labrum, MD [facility] [address] [place], [place]  [phone] (office)

## 2017-10-22 ENCOUNTER — Ambulatory Visit (INDEPENDENT_AMBULATORY_CARE_PROVIDER_SITE_OTHER): Payer: Self-pay | Admitting: General Surgery

## 2017-10-22 ENCOUNTER — Encounter: Payer: Self-pay | Admitting: General Surgery

## 2017-10-22 ENCOUNTER — Ambulatory Visit (HOSPITAL_COMMUNITY): Payer: Self-pay | Admitting: Internal Medicine

## 2017-10-22 ENCOUNTER — Ambulatory Visit (HOSPITAL_COMMUNITY): Payer: Self-pay

## 2017-10-22 VITALS — BP 112/69 | HR 74 | Temp 97.3°F | Resp 18 | Ht 65.0 in | Wt 131.0 lb

## 2017-10-22 DIAGNOSIS — Z170 Estrogen receptor positive status [ER+]: Secondary | ICD-10-CM

## 2017-10-22 DIAGNOSIS — C50811 Malignant neoplasm of overlapping sites of right female breast: Secondary | ICD-10-CM

## 2017-10-22 MED ORDER — OXYCODONE HCL 5 MG PO TABS
5.0000 mg | ORAL_TABLET | ORAL | 0 refills | Status: DC | PRN
Start: 2017-10-22 — End: 2017-11-05

## 2017-10-22 NOTE — Addendum Note (Signed)
Addended by: Curlene Labrum on: 10/22/2017 02:27 PM   Modules accepted: Orders

## 2017-10-22 NOTE — Patient Instructions (Signed)
Pain meds as needed. Can shower. Dressing to drain sites as needed for drainage.

## 2017-10-22 NOTE — Progress Notes (Addendum)
[facility] Note   HPI:  47 y.o. Female presents to clinic for follow-up evaluation after her right modified radical mastectomy. Patient reports she is doing well. No complaints.  Axilla JP with SS output 30, 20, and 15 cc reported   Review of Systems:  No fevers or chills No redness at incision Pain improving  All other review of systems: otherwise negative   Vital Signs:  BP 112/69   Pulse 74   Temp (!) 97.3 F (36.3 C)   Resp 18   Ht 5\' 5"  (1.651 m)   Wt 131 lb (59.4 kg)   BMI 21.80 kg/m    Physical Exam:  Physical Exam  Constitutional: She is well-developed, well-nourished, and in no distress.  HENT:  Head: Normocephalic.  Eyes: Pupils are equal, round, and reactive to light.  Neck: Normal range of motion.  Cardiovascular: Normal rate.  Pulmonary/Chest: Effort normal.  Right breast incisions c/d/i with sutures, more removed, steristrip placed, 2 left in the area with most tension; axilla drain removed, axilla incision c/d/i without erythema or drainage  Abdominal: Soft.  Vitals reviewed.   Laboratory studies: None   Imaging:  None   Assessment:  47 y.o. yo Female s/p R modified radical mastectomy. Doing fair.  Plan:  - Will see on 3/14 for recheck and to removed remaining sutures   - Shower per routine - Dressing to drain site as needed  - Steristrip in place until peeling  - Roxi refilled   All of the above recommendations were discussed with the patient and patient's family, and all of patient's and family's questions were answered to their expressed satisfaction.  Curlene Labrum, MD [facility] [address] [place], [place] [address] [phone] (office)

## 2017-10-26 ENCOUNTER — Other Ambulatory Visit (HOSPITAL_COMMUNITY): Payer: Self-pay | Admitting: Emergency Medicine

## 2017-10-26 DIAGNOSIS — C50811 Malignant neoplasm of overlapping sites of right female breast: Secondary | ICD-10-CM

## 2017-10-26 NOTE — Progress Notes (Signed)
Referral for OT placed.

## 2017-10-28 ENCOUNTER — Other Ambulatory Visit: Payer: Self-pay

## 2017-10-28 ENCOUNTER — Ambulatory Visit (HOSPITAL_COMMUNITY): Payer: Self-pay | Attending: Internal Medicine | Admitting: Occupational Therapy

## 2017-10-28 ENCOUNTER — Encounter (HOSPITAL_COMMUNITY): Payer: Self-pay | Admitting: Occupational Therapy

## 2017-10-28 DIAGNOSIS — R29898 Other symptoms and signs involving the musculoskeletal system: Secondary | ICD-10-CM | POA: Insufficient documentation

## 2017-10-28 DIAGNOSIS — M25611 Stiffness of right shoulder, not elsewhere classified: Secondary | ICD-10-CM | POA: Insufficient documentation

## 2017-10-28 NOTE — Patient Instructions (Signed)
Perform each exercise ____15____ reps. 2-3x day.   Protraction - STANDING  Start by holding a wand or cane at chest height.  Next, slowly push the wand outwards in front of your body so that your elbows become fully straightened. Then, return to the original position.     Shoulder FLEXION - STANDING - PALMS UP  In the standing position, hold a wand/cane with both arms, palms up on both sides. Raise up the wand/cane allowing your unaffected arm to perform most of the effort. Your affected arm should be partially relaxed.      Internal/External ROTATION - STANDING  In the standing position, hold a wand/cane with both hands keeping your elbows bent. Move your arms and wand/cane to one side.  Your affected arm should be partially relaxed while your unaffected arm performs most of the effort.       Shoulder ABDUCTION - STANDING  While holding a wand/cane palm face up on the injured side and palm face down on the uninjured side, slowly raise up your injured arm to the side.                     Horizontal Abduction/Adduction      Straight arms holding cane at shoulder height, bring cane to right, center, left. Repeat starting to left.   Copyright  VHI. All rights reserved.

## 2017-10-29 NOTE — Therapy (Signed)
[place], [place], [address] Phone: [phone]   Fax:  [phone]  Occupational Therapy Evaluation  Patient Details  Name: Angel Hale MRN: [id number] Date of Birth: Apr 30, 1971 Referring Provider: Dr. Zoila Shutter   Encounter Date: 10/28/2017  OT End of Session - 10/29/17 0747    Visit Number  1    Number of Visits  5    Date for OT Re-Evaluation  11/28/17    Authorization Type  Self-pay; pt has applied for Medicaid and [place] program    OT Start Time  1435    OT Stop Time  1512    OT Time Calculation (min)  37 min    Activity Tolerance  Patient tolerated treatment well    Behavior During Therapy  [facility] for tasks assessed/performed       Past Medical History:  Diagnosis Date  . Cancer [facility])    breast cancer    Past Surgical History:  Procedure Laterality Date  . BUNIONECTOMY Left 2000  . EXTERNAL FIXATION LEG Left 02/16/2017   Procedure: EXTERNAL FIXATION LEG;  Surgeon: Mcarthur Rossetti, MD;  Location: [place];  Service: Orthopedics;  Laterality: Left;  . HARDWARE REMOVAL Left 05/21/2017   Procedure: REMOVAL OF RETAINED PROMINENT SCREW LEFT ANKLE;  Surgeon: Mcarthur Rossetti, MD;  Location: [place];  Service: Orthopedics;  Laterality: Left;  Marland Kitchen MASTECTOMY MODIFIED RADICAL Right 10/14/2017   Procedure: MASTECTOMY MODIFIED RADICAL;  Surgeon: Virl Cagey, MD;  Location: [facility];  Service: General;  Laterality: Right;  . ORIF ANKLE FRACTURE Left 03/03/2017  . ORIF ANKLE FRACTURE Left 03/03/2017   Procedure: OPEN REDUCTION INTERNAL FIXATION (ORIF) LEFT PILON FRACTURE AND LEFT LATERAL MALLEOLUS;  Surgeon: Mcarthur Rossetti, MD;  Location: [place];  Service: Orthopedics;  Laterality: Left;  . OVARIAN CYST REMOVAL Left 1991  . PORTACATH PLACEMENT    . PORTACATH PLACEMENT Left 05/20/2017   Procedure: INSERTION PORT-A-CATH;  Surgeon: Virl Cagey, MD;  Location: [facility];   Service: General;  Laterality: Left;  . WISDOM TOOTH EXTRACTION      There were no vitals filed for this visit.  Subjective Assessment - 10/28/17 1620    Subjective   S: I just want to be able to lift things and do my work.     Pertinent History  Pt is a 47 y/o female s/p right mastectomy on 10/14/17 presenting with decreased ROM and functional use of RUE as dominant. Pt has had most of the stitches removed, still has a few in place. Pt was referred to occupational therapy for evaluation and treatment by Dr. Mathis Dad Higgs.      Patient Stated Goals  To be able to use my arm and complete my work.    Currently in Pain?  Yes    Pain Score  3     Pain Location  Breast    Pain Orientation  Right    Pain Descriptors / Indicators  Sore    Pain Type  Surgical pain    Pain Radiating Towards  n/a    Pain Onset  1 to 4 weeks ago    Pain Frequency  Intermittent    Aggravating Factors   stretching    Pain Relieving Factors  rest, elevation    Effect of Pain on Daily Activities  min/mod effect on ADL completion        [facility] OT Assessment - 10/28/17 1435  Assessment   Medical Diagnosis  s/p right mastectomy    Referring Provider  Dr. Mathis Dad Higgs    Onset Date/Surgical Date  10/14/17    Hand Dominance  Right    Next MD Visit  11/05/2017      Precautions   Precautions  Other (comment)    Precaution Comments  s/p mastectomy      Balance Screen   Has the patient fallen in the past 6 months  No    Has the patient had a decrease in activity level because of a fear of falling?   No    Is the patient reluctant to leave their home because of a fear of falling?   No      Prior Function   Level of Independence  Independent    Leisure  working on farm-animals, flowers, gardening      ADL   ADL comments  Pt is having difficulty with reaching overhead and behind back, lifting and carrying items      Written Expression   Dominant Hand  Right      Cognition   Overall Cognitive Status  Within  Functional Limits for tasks assessed      ROM / Strength   AROM / PROM / Strength  AROM;PROM;Strength      Palpation   Palpation comment  Small muscle knot palpated at upper trapezius      AROM   Overall AROM Comments  Assessed standing, er/IR adducted    AROM Assessment Site  Shoulder    Right/Left Shoulder  Right    Right Shoulder Flexion  112 Degrees    Right Shoulder ABduction  102 Degrees    Right Shoulder Internal Rotation  90 Degrees    Right Shoulder External Rotation  80 Degrees      PROM   Overall PROM Comments  Assessed supine, er/IR adducted    PROM Assessment Site  Shoulder    Right/Left Shoulder  Right    Right Shoulder Flexion  120 Degrees    Right Shoulder ABduction  98 Degrees    Right Shoulder Internal Rotation  90 Degrees    Right Shoulder External Rotation  90 Degrees      Strength   Overall Strength Comments  Assessed seated, er/IR adducted    Strength Assessment Site  Shoulder    Right/Left Shoulder  Right    Right Shoulder Flexion  4/5    Right Shoulder ABduction  4/5    Right Shoulder Internal Rotation  4/5    Right Shoulder External Rotation  4/5                      OT Education - 10/29/17 0747    Education provided  Yes    Education Details  AA/ROM exercises for shoulder    Person(s) Educated  Patient;Spouse    Methods  Explanation;Demonstration;Handout    Comprehension  Verbalized understanding;Returned demonstration       OT Short Term Goals - 10/29/17 0756      OT SHORT TERM GOAL #1   Title  Pt will be provided with and educated on HEP to improve mobility required for ADLs using RUE as dominant.    Time  4    Period  Weeks    Status  New    Target Date  11/28/17      OT SHORT TERM GOAL #2   Title  Pt will improve RUE A/ROM to WNL to improve ability  to reach overhead during daily tasks.     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     Time  4    Period  Weeks    Status  New      OT SHORT TERM GOAL #4   Title  Pt will decrease RUE fascial restrictions to minimal amounts or less to improve mobility required for reaching tasks.    Time  4    Period  Weeks    Status  New      OT SHORT TERM GOAL #5   Title  Pt will return to highest level of functioning using RUE as dominant during B/IADL tasks.     Time  4    Period  Weeks    Status  New               Plan - 10/29/17 0748    Clinical Impression Statement  A: Pt is a 47 y/o female s/p right mastectomy with lymph node removal x11 on 10/14/17. Pt presents with decreased ROM of right shoulder due to recent surgery. Pt is currently completing HEP provided by acute care PT, educated pt today on AA/ROM HEP to begin gentle stretching of RUE. Pt is self-pay and is agreeable to attend therapy 1x/week with updated HEP each session and will let OT know if she feels she needs to attend more often at which time OT will adjust as needed.     Occupational Profile and client history currently impacting functional performance  Pt is independent prior to sx and is motivated to return to highest level of functioning    Occupational performance deficits (Please refer to evaluation for details):  ADL's;IADL's;Rest and Sleep;Work;Leisure;Social Participation    Rehab Potential  Good    OT Frequency  1x / week    OT Duration  4 weeks    OT Treatment/Interventions  Self-care/ADL training;Therapeutic exercise;Ultrasound;Manual Therapy;Therapeutic activities;Cryotherapy;Energy conservation;Scar mobilization;Moist Heat;Passive range of motion;Patient/family education    Plan  P: Pt will benefit from skilled OT services to decrease pain and fascial restrictions, increase ROM, strength, and functional use of RUE as dominant during daily tasks. Treatment plan: gentle myofascial release, P/ROM, AA/ROM, A/ROM, gentle RUE strengthening, scar massage, HEP     Clinical Decision Making  Limited treatment options, no task modification necessary    Consulted and Agree with Plan of Care  Patient       Patient will benefit from skilled therapeutic intervention in order to improve the following deficits and impairments:  Decreased scar mobility, Decreased activity tolerance, Decreased strength, Impaired flexibility, Decreased range of motion, Increased edema, Pain, Impaired UE functional use, Increased fascial restrictions  Visit Diagnosis: Stiffness of right shoulder, not elsewhere classified  Other symptoms and signs involving the musculoskeletal system    Problem List Patient Active Problem List   Diagnosis Date Noted  . Breast cancer ([place]) 10/14/2017  . Malignant neoplasm of overlapping sites of right breast in female, estrogen receptor positive ([place]) 05/25/2017  . Painful orthopaedic hardware ([place]) left ankle 05/21/2017  . Malignant neoplasm of overlapping sites of right female breast ([place]) 05/14/2017  . Breast mass, right 05/12/2017  . Closed left pilon fracture, initial encounter 02/16/2017   Guadelupe Sabin, OTR/L  [phone] 10/29/2017, 8:00 AM  [place]  [facility] [place], [place], [address] Phone: [phone]   Fax:  [phone]  Name: Violetta Chesney MRN: [id number] Date of Birth: 05/19/1971

## 2017-11-05 ENCOUNTER — Encounter: Payer: Self-pay | Admitting: General Surgery

## 2017-11-05 ENCOUNTER — Encounter (HOSPITAL_COMMUNITY): Payer: Self-pay

## 2017-11-05 ENCOUNTER — Other Ambulatory Visit: Payer: Self-pay

## 2017-11-05 ENCOUNTER — Ambulatory Visit (HOSPITAL_COMMUNITY): Payer: Self-pay

## 2017-11-05 ENCOUNTER — Ambulatory Visit (INDEPENDENT_AMBULATORY_CARE_PROVIDER_SITE_OTHER): Payer: Self-pay | Admitting: General Surgery

## 2017-11-05 VITALS — BP 120/70 | HR 51 | Temp 97.8°F | Resp 18 | Ht 65.0 in | Wt 130.0 lb

## 2017-11-05 DIAGNOSIS — R29898 Other symptoms and signs involving the musculoskeletal system: Secondary | ICD-10-CM

## 2017-11-05 DIAGNOSIS — C50811 Malignant neoplasm of overlapping sites of right female breast: Secondary | ICD-10-CM

## 2017-11-05 DIAGNOSIS — M25611 Stiffness of right shoulder, not elsewhere classified: Secondary | ICD-10-CM

## 2017-11-05 DIAGNOSIS — Z170 Estrogen receptor positive status [ER+]: Secondary | ICD-10-CM

## 2017-11-05 MED ORDER — OXYCODONE HCL 5 MG PO TABS: 5.0000 mg | ORAL_TABLET | ORAL | 0 refills | Status: DC | PRN

## 2017-11-05 NOTE — Progress Notes (Signed)
[facility] Note   HPI:  47 y.o. Female presents to clinic for post-op follow-up evaluation after right modified radical mastectomy. She is doing well. She is healing and doing PT. She says her range of motion is improving and her arms were measured and the right is smaller than the left.  Review of Systems:  No right arm swelling Incisions healing No redness or drainage No fevers or chills  + Left leg pain from prior complex fracture  All other review of systems: otherwise negative   Vital Signs:  BP 120/70   Pulse (!) 51   Temp 97.8 F (36.6 C)   Resp 18   Ht 5\' 5"  (1.651 m)   Wt 130 lb (59 kg)   BMI 21.63 kg/m    Physical Exam:  Physical Exam  Constitutional: She is well-developed, well-nourished, and in no distress.  HENT:  Head: Normocephalic.  Eyes: Pupils are equal, round, and reactive to light.  Neck: Normal range of motion.  Cardiovascular: Normal rate.  Pulmonary/Chest: Effort normal.  Right breast incision c/d/i with steristrips in place, 2 remaining sutures removed without issues, no erythema or drainage, axillary incision c/d/i with dermabond, no swelling, right arm without swelling   Abdominal: Soft. She exhibits no distension. There is no tenderness.  Vitals reviewed.   Laboratory studies: None    Imaging: None   Assessment:  47 y.o. yo Female s/p R modified radical mastectomy for breast cancer treated with neoadjuvant chemotherapy. She is doing well. Her main complaint is for her left leg which was repaired last year after a complex fracture. She has plates and screws in the area, and since her initiation of chemotherapy, she has had pain in the area.   Plan:  - Doing great and wound looks great  - Follow up with Oncology as scheduled, they will also schedule her with radiation in the upcoming weeks  - Steri strips will fall off/ peel off in the upcoming weeks  - Can shave her armpit but with help if desired  - Follow up with me PRN   - Follow up with orthopedic surgeon regarding the left leg, also discuss with oncology, I am not sure if steroids would help or if they would be allowed with her chemotherapy, etc - Last refill of roxicodone sent in to pharmacy for post operative pain   All of the above recommendations were discussed with the patient and patient's family, and all of patient's and family's questions were answered to their expressed satisfaction.  Curlene Labrum, MD [facility] [address] [place], [place] [address] [phone] (office)

## 2017-11-05 NOTE — Patient Instructions (Addendum)
Repeat all exercises 10-15 times, 1-2 times per day.  1) Shoulder Protraction    Begin with elbows by your side, slowly "punch" straight out in front of you.      2) Shoulder Flexion  Supine:     Standing:         Begin with arms at your side with thumbs pointed up, slowly raise both arms up and forward towards overhead.               3) Horizontal abduction/adduction  Supine:   Standing:           Begin with arms straight out in front of you, bring out to the side in at "T" shape. Keep arms straight entire time.                 4) Internal & External Rotation    *No band* -Stand with elbows at the side and elbows bent 90 degrees. Move your forearms away from your body, then bring back inward toward the body.     5) Shoulder Abduction  Supine:     Standing:       Lying on your back begin with your arms flat on the table next to your side. Slowly move your arms out to the side so that they go overhead, in a jumping jack or snow angel movement.    6) X to V arms (cheerleader move):  Begin with arms straight down, crossed in front of body in an "X". Keeping arms crossed, lift arms straight up overhead. Then spread arms apart into a "V" shape.  Bring back together into x and lower down to starting position.      WALKING Walking is a great form of exercise to increase your strength, endurance, and overall fitness. A walking program can help you start slowly and gradually build endurance as you go. Everyone's ability is different, so each person's starting point will be different. You do not have to follow them exactly. They are just samples. You simply find out what's right for you and stick to that program. In the beginning, you'll start off walking 2-3 times a day for short distances. As you get stronger, you'll be working further at just 1-2 times a day. A. You Can Walk For A Certain Length Of Time Each Day Walk 5 minutes 3x/  day. Increase 2 minutes every 2 days (3x/ day). Work up to 25-30 minutes (1-2x/ day).  Example:   Day 1-2  5 minutes 3x/ day   Day 7-8  12 minutes 2-3x/ day   Day 13-14 25 minutes 1-2x/ day B. You Can Walk For A Certain Distance Each Day Distance may be substituted for time.  Example:   3 trips to the mailbox (at road)   3 trips to corner of block   3 trips around block C. Join A Walking Club To get involved with the [employer], contact [email or web address]    Occupational Therapy Information for During and After Head/Neck Cancer Treatment . Lymphedema is a swelling condition that you may be at risk for in your neck and/or face if you have radiation treatment to the area and/or if you have surgery that includes removing lymph nodes. There is treatment available for this condition and it is not life threatening. Contact your physician with concerns. . An excellent resource for those seeking information on lymphedema is the National Lymphedema Network's website. It can be accessed at UpgradePros.com.pt . If you notice swelling  in your neck, face, or arm at any time following surgery (even if it is many year from now), please contact your doctor or physical/occupational therapist to discuss this. Lymphedema may be treated at any time but it is easier for you if it is treated early on.  . If you have had surgery to your neck or arm please check with your surgeon about how soon to start doing neck/arm range of motion exercises. If you are not having surgery, I encourage you to start doing neck range of motion exercises today and continue these while undergoing treatment, UNLESS you have irritation of your skin or soft tissue that is aggravated by doing them. These exercises are intended to help you prevent loss of range of motion and/or to gain range of motion in your neck/arm (which can be limited by tightening effects of radiation), and NOT to aggravate these  tissues if they develop sensitivities from treatment.  . Neck/Arm range of motion exercises should be done to the point of feeling a GENTLE, TOLERABLE stretch only. . You are encouraged to start a walking or other exercise program tomorrow and continue this as much as you are able through and after treatment. . Please feel free to contact me with any questions. Ailene Ravel, OTR/L, CBIS Occupational Therapist and Certified Brain Injury Specialist [facility]  [address] [place], [place] [address] [phone] [email or web address]

## 2017-11-05 NOTE — Patient Instructions (Signed)
Follow up with oncology and radiation oncology. Allow steristrips to peel off on their own.

## 2017-11-06 NOTE — Therapy (Signed)
[place], [place], [address] Phone: [phone]   Fax:  [phone]  Occupational Therapy Treatment  Patient Details  Name: Malyia Bovey MRN: [id number] Date of Birth: 1971-08-03 Referring Provider: Dr. Mathis Dad Higgs   Encounter Date: 11/05/2017  OT End of Session - 11/06/17 0828    Visit Number  2    Number of Visits  5    Date for OT Re-Evaluation  11/28/17    Authorization Type  Self-pay; pt has applied for Medicaid and [place] program    OT Start Time  1305    OT Stop Time  1355    OT Time Calculation (min)  50 min    Activity Tolerance  Patient tolerated treatment well    Behavior During Therapy  [facility] for tasks assessed/performed       Past Medical History:  Diagnosis Date  . Cancer [facility])    breast cancer    Past Surgical History:  Procedure Laterality Date  . BUNIONECTOMY Left 2000  . EXTERNAL FIXATION LEG Left 02/16/2017   Procedure: EXTERNAL FIXATION LEG;  Surgeon: Mcarthur Rossetti, MD;  Location: [place];  Service: Orthopedics;  Laterality: Left;  . HARDWARE REMOVAL Left 05/21/2017   Procedure: REMOVAL OF RETAINED PROMINENT SCREW LEFT ANKLE;  Surgeon: Mcarthur Rossetti, MD;  Location: [place];  Service: Orthopedics;  Laterality: Left;  Marland Kitchen MASTECTOMY MODIFIED RADICAL Right 10/14/2017   Procedure: MASTECTOMY MODIFIED RADICAL;  Surgeon: Virl Cagey, MD;  Location: [facility];  Service: General;  Laterality: Right;  . ORIF ANKLE FRACTURE Left 03/03/2017  . ORIF ANKLE FRACTURE Left 03/03/2017   Procedure: OPEN REDUCTION INTERNAL FIXATION (ORIF) LEFT PILON FRACTURE AND LEFT LATERAL MALLEOLUS;  Surgeon: Mcarthur Rossetti, MD;  Location: [place];  Service: Orthopedics;  Laterality: Left;  . OVARIAN CYST REMOVAL Left 1991  . PORTACATH PLACEMENT    . PORTACATH PLACEMENT Left 05/20/2017   Procedure: INSERTION PORT-A-CATH;  Surgeon: Virl Cagey, MD;  Location: [facility];   Service: General;  Laterality: Left;  . WISDOM TOOTH EXTRACTION      There were no vitals filed for this visit.  Subjective Assessment - 11/05/17 1326    Subjective   S: I've been working on my exercises every day.    Patient is accompained by:  Family member Husband    Currently in Pain?  Yes    Pain Score  5     Pain Location  Breast    Pain Orientation  Right    Pain Descriptors / Indicators  Sore    Pain Type  Surgical pain    Pain Radiating Towards  n/a    Pain Onset  More than a month ago    Pain Frequency  Intermittent    Aggravating Factors   movement and use and stretching    Pain Relieving Factors  rest, elevation    Effect of Pain on Daily Activities  min/mod effect    Multiple Pain Sites  No         [facility] OT Assessment - 11/05/17 1328      Assessment   Medical Diagnosis  s/p right mastectomy      Precautions   Precautions  Other (comment)    Precaution Comments  s/p mastectomy      LYMPHEDEMA/ONCOLOGY QUESTIONNAIRE - 11/05/17 1426      Lymphedema Assessments   Lymphedema Assessments  Upper extremities      Right Upper Extremity  Lymphedema   10 cm Proximal to Olecranon Process  22.5 cm    Olecranon Process  21 cm    10 cm Proximal to Ulnar Styloid Process  19 cm    Just Proximal to Ulnar Styloid Process  4.5 cm    Across Hand at [employer]  7.5 cm    At [place] of 2nd Digit  6 cm      Left Upper Extremity Lymphedema   10 cm Proximal to Olecranon Process  23 cm    Olecranon Process  22 cm    10 cm Proximal to Ulnar Styloid Process  18 cm    Just Proximal to Ulnar Styloid Process  5 cm    Across Hand at [employer]  7 cm    At [place] of 2nd Digit  6 cm              OT Treatments/Exercises (OP) - 11/05/17 1329      Exercises   Exercises  Shoulder      Shoulder Exercises: Supine   Protraction  PROM;5 reps;AROM;10 reps    Horizontal ABduction  PROM;5 reps;AROM;10 reps    External Rotation  PROM;5 reps;AROM;10 reps    Internal  Rotation  PROM;5 reps;AROM;10 reps    Flexion  PROM;5 reps;AROM;10 reps    ABduction  PROM;5 reps;AROM;10 reps      Manual Therapy   Manual Therapy  Myofascial release    Manual therapy comments  Manual therapy completed prior to exercises.    Myofascial Release  Myofascial release and manual stretching completed to right upper trapezius to increase functional mobility and increase joint mobility in a pain free zone.              OT Education - 11/05/17 1614    Education provided  Yes    Education Details  Discussed lymphedema and ways to prevent it/precautions to RUE. HEP updated to A/ROM, Handout provided for Lymphedema resources, walking program for overall endurance. Answered questions regarding ways to prevent lymphedema, diet recommendations, and any activity restrictions.     Person(s) Educated  Patient;Spouse    Methods  Explanation;Demonstration;Handout    Comprehension  Verbalized understanding;Returned demonstration       OT Short Term Goals - 11/06/17 0831      OT SHORT TERM GOAL #1   Title  Pt will be provided with and educated on HEP to improve mobility required for ADLs using RUE as dominant.    Time  4    Period  Weeks    Status  On-going      OT SHORT TERM GOAL #2   Title  Pt will improve RUE A/ROM to WNL to improve ability to reach overhead during daily tasks.     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     Time  4    Period  Weeks    Status  On-going      OT SHORT TERM GOAL #4   Title  Pt will decrease RUE fascial restrictions to minimal amounts or less to improve mobility required for reaching tasks.    Time  4    Period  Weeks    Status  On-going      OT SHORT TERM GOAL #5  Title  Pt will return to highest level of functioning using RUE as dominant during B/IADL tasks.     Time  4    Period  Weeks    Status  On-going                Plan - 11/06/17 9563    Clinical Impression Statement  A: Initiated myofascial release, manual stretching, A/ROM, and updated HEP. Spent a good portion of session on education and answering patient's questions regarding lymphedema. See education section.     Plan  P: Upgrade HEP as needed. Attempt low weight strengthening.     Consulted and Agree with Plan of Care  Patient;Family member/caregiver    Family Member Consulted  Husband       Patient will benefit from skilled therapeutic intervention in order to improve the following deficits and impairments:  Decreased scar mobility, Decreased activity tolerance, Decreased strength, Impaired flexibility, Decreased range of motion, Increased edema, Pain, Impaired UE functional use, Increased fascial restrictions  Visit Diagnosis: Stiffness of right shoulder, not elsewhere classified  Other symptoms and signs involving the musculoskeletal system    Problem List Patient Active Problem List   Diagnosis Date Noted  . Breast cancer ([place]) 10/14/2017  . Malignant neoplasm of overlapping sites of right breast in female, estrogen receptor positive ([place]) 05/25/2017  . Painful orthopaedic hardware ([place]) left ankle 05/21/2017  . Malignant neoplasm of overlapping sites of right female breast ([place]) 05/14/2017  . Breast mass, right 05/12/2017  . Closed left pilon fracture, initial encounter 02/16/2017   Ailene Ravel, OTR/L,CBIS  [phone]  11/06/2017, 8:33 AM  [facility] [address] [place], [place], [address] Phone: [phone]   Fax:  [phone]  Name: Otto Lute MRN: [id number] Date of Birth: [birth year]

## 2017-11-11 ENCOUNTER — Encounter (HOSPITAL_COMMUNITY): Payer: Self-pay

## 2017-11-11 ENCOUNTER — Ambulatory Visit (HOSPITAL_COMMUNITY): Payer: Self-pay

## 2017-11-11 ENCOUNTER — Other Ambulatory Visit: Payer: Self-pay

## 2017-11-11 DIAGNOSIS — M25611 Stiffness of right shoulder, not elsewhere classified: Secondary | ICD-10-CM

## 2017-11-11 DIAGNOSIS — R29898 Other symptoms and signs involving the musculoskeletal system: Secondary | ICD-10-CM

## 2017-11-11 NOTE — Therapy (Signed)
[place], [place], [address] Phone: [phone]   Fax:  [phone]  Occupational Therapy Treatment  Patient Details  Name: Angel Hale MRN: [id number] Date of Birth: Nov 03, 1970 Referring Provider: Dr. Mathis Dad Higgs   Encounter Date: 11/11/2017  OT End of Session - 11/11/17 0904    Visit Number  3    Number of Visits  5    Date for OT Re-Evaluation  11/28/17    Authorization Type  Self-pay; pt has applied for Medicaid and [place] program    OT Start Time  0825    OT Stop Time  0907    OT Time Calculation (min)  42 min    Activity Tolerance  Patient tolerated treatment well    Behavior During Therapy  [facility] for tasks assessed/performed       Past Medical History:  Diagnosis Date  . Cancer [facility])    breast cancer    Past Surgical History:  Procedure Laterality Date  . BUNIONECTOMY Left 2000  . EXTERNAL FIXATION LEG Left 02/16/2017   Procedure: EXTERNAL FIXATION LEG;  Surgeon: Mcarthur Rossetti, MD;  Location: [place];  Service: Orthopedics;  Laterality: Left;  . HARDWARE REMOVAL Left 05/21/2017   Procedure: REMOVAL OF RETAINED PROMINENT SCREW LEFT ANKLE;  Surgeon: Mcarthur Rossetti, MD;  Location: [place];  Service: Orthopedics;  Laterality: Left;  Marland Kitchen MASTECTOMY MODIFIED RADICAL Right 10/14/2017   Procedure: MASTECTOMY MODIFIED RADICAL;  Surgeon: Virl Cagey, MD;  Location: [facility];  Service: General;  Laterality: Right;  . ORIF ANKLE FRACTURE Left 03/03/2017  . ORIF ANKLE FRACTURE Left 03/03/2017   Procedure: OPEN REDUCTION INTERNAL FIXATION (ORIF) LEFT PILON FRACTURE AND LEFT LATERAL MALLEOLUS;  Surgeon: Mcarthur Rossetti, MD;  Location: [place];  Service: Orthopedics;  Laterality: Left;  . OVARIAN CYST REMOVAL Left 1991  . PORTACATH PLACEMENT    . PORTACATH PLACEMENT Left 05/20/2017   Procedure: INSERTION PORT-A-CATH;  Surgeon: Virl Cagey, MD;  Location: [facility];   Service: General;  Laterality: Left;  . WISDOM TOOTH EXTRACTION      There were no vitals filed for this visit.  Subjective Assessment - 11/11/17 0838    Subjective   S: My arm is whole lot better.     Patient is accompained by:  Family member Husband    Currently in Pain?  Yes    Pain Score  3     Pain Location  Breast    Pain Orientation  Right    Pain Descriptors / Indicators  Sore    Pain Type  Surgical pain         [facility] OT Assessment - 11/11/17 0837      Assessment   Medical Diagnosis  s/p right mastectomy      Precautions   Precautions  Other (comment)    Precaution Comments  s/p mastectomy               OT Treatments/Exercises (OP) - 11/11/17 0837      Exercises   Exercises  Shoulder      Shoulder Exercises: Supine   Protraction  PROM;5 reps;Strengthening;12 reps    Protraction Weight (lbs)  2    Horizontal ABduction  PROM;5 reps;Strengthening;12 reps    Horizontal ABduction Weight (lbs)  2    External Rotation  PROM;5 reps;Strengthening;12 reps    External Rotation Weight (lbs)  2    Internal Rotation  PROM;5 reps;Strengthening;12 reps  Internal Rotation Weight (lbs)  2    Flexion  PROM;5 reps;Strengthening;12 reps    Shoulder Flexion Weight (lbs)  2    ABduction  PROM;5 reps;Strengthening;12 reps    Shoulder ABduction Weight (lbs)  2      Shoulder Exercises: Seated   Horizontal ABduction  Theraband;12 reps palms down and plams up    Theraband Level (Shoulder Horizontal ABduction)  Level 2 (Red)    External Rotation  Theraband;12 reps    Theraband Level (Shoulder External Rotation)  Level 2 (Red)    Internal Rotation  Theraband;10 reps    Theraband Level (Shoulder Internal Rotation)  Level 2 (Red)    Diagonals  Theraband;12 reps    Theraband Level (Shoulder Diagonals)  Level 2 (Red)      Shoulder Exercises: Standing   Extension  Theraband;10 reps    Theraband Level (Shoulder Extension)  Level 2 (Red)    Row  Theraband;10 reps     Theraband Level (Shoulder Row)  Level 2 (Red)    Retraction  Theraband;10 reps    Theraband Level (Shoulder Retraction)  Level 2 (Red)      Shoulder Exercises: ROM/Strengthening   UBE (Upper Arm Bike)  Level 1 2' reverse 2' forward  pace: 3.5-4.0    "W" Arms  10X with 2#    X to V Arms  10X with 2#      Manual Therapy   Manual Therapy  Myofascial release    Manual therapy comments  Manual therapy completed prior to exercises.    Myofascial Release  Myofascial release and manual stretching completed to right upper trapezius to increase functional mobility and increase joint mobility in a pain free zone.              OT Education - 11/11/17 0848    Education provided  Yes    Education Details  red theraband shoulder strengthening. Discussed using weights at home and what repetition count to use based on weight.     Person(s) Educated  Patient;Spouse    Methods  Explanation;Demonstration;Verbal cues;Handout    Comprehension  Returned demonstration;Verbalized understanding       OT Short Term Goals - 11/06/17 0831      OT SHORT TERM GOAL #1   Title  Pt will be provided with and educated on HEP to improve mobility required for ADLs using RUE as dominant.    Time  4    Period  Weeks    Status  On-going      OT SHORT TERM GOAL #2   Title  Pt will improve RUE A/ROM to WNL to improve ability to reach overhead during daily tasks.     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     Time  4    Period  Weeks    Status  On-going      OT SHORT TERM GOAL #4   Title  Pt will decrease RUE fascial restrictions to minimal amounts or less to improve mobility required for reaching tasks.    Time  4    Period  Weeks    Status  On-going      OT SHORT TERM GOAL #5   Title  Pt will return to highest level of functioning using RUE as dominant  during B/IADL tasks.     Time   4    Period  Weeks    Status  On-going               Plan - 11/11/17 1010    Clinical Impression Statement  A: Progressed to shoulder strengthening with hand weights and red theraband this session. patient has full ROM with all acitve and passive movements. VC for form and technique. HEP was updated.    Plan  P: Continue to progress strengthening. Complete strengthening while holding large green therapy ball. counter top push ups.       Patient will benefit from skilled therapeutic intervention in order to improve the following deficits and impairments:  Decreased scar mobility, Decreased activity tolerance, Decreased strength, Impaired flexibility, Decreased range of motion, Increased edema, Pain, Impaired UE functional use, Increased fascial restrictions  Visit Diagnosis: Stiffness of right shoulder, not elsewhere classified  Other symptoms and signs involving the musculoskeletal system    Problem List Patient Active Problem List   Diagnosis Date Noted  . Breast cancer ([place]) 10/14/2017  . Malignant neoplasm of overlapping sites of right breast in female, estrogen receptor positive ([place]) 05/25/2017  . Painful orthopaedic hardware ([place]) left ankle 05/21/2017  . Malignant neoplasm of overlapping sites of right female breast ([place]) 05/14/2017  . Breast mass, right 05/12/2017  . Closed left pilon fracture, initial encounter 02/16/2017   Ailene Ravel, OTR/L,CBIS  [phone]  11/11/2017, 10:12 AM  [facility] [address] [place], [place], [address] Phone: [phone]   Fax:  [phone]  Name: Angel Hale MRN: [id number] Date of Birth: 1970-09-28

## 2017-11-11 NOTE — Patient Instructions (Signed)
Complete the following exercises 1 time day. 10-15 repetitions   Strengthening: Chest Pull - Resisted   Hold Theraband in front of body with hands about shoulder width a part. Pull band a part and back together slowly. Complete this exercises first with palms down then complete one more set with palms up. Repeat ____ times. Complete ____ set(s) per session.. Repeat ____ session(s) per day.  http://orth.exer.us/926   Copyright  VHI. All rights reserved.   PNF Strengthening: Resisted   Standing with resistive band around each hand, bring right arm up and away, thumb back. Repeat ____ times per set. Do ____ sets per session. Do ____ sessions per day.                           Resisted External Rotation: in Neutral - Bilateral   Sit or stand, tubing in both hands, elbows at sides, bent to 90, forearms forward. Pinch shoulder blades together and rotate forearms out. Keep elbows at sides. Repeat ____ times per set. Do ____ sets per session. Do ____ sessions per day.  http://orth.exer.us/966   Copyright  VHI. All rights reserved.   PNF Strengthening: Resisted   Standing, hold resistive band above head. Bring right arm down and out from side. Repeat ____ times per set. Do ____ sets per session. Do ____ sessions per day.  http://orth.exer.us/922   Copyright  VHI. All rights reserved.

## 2017-11-13 ENCOUNTER — Telehealth: Payer: Self-pay | Admitting: General Surgery

## 2017-11-13 MED ORDER — TRAMADOL HCL 50 MG PO TABS: 50.0000 mg | ORAL_TABLET | Freq: Four times a day (QID) | ORAL | 0 refills | Status: DC | PRN

## 2017-11-13 NOTE — Telephone Encounter (Signed)
Patient with pain under her arm. Does not want the roxicodone as this is too strong but tylenol and ibuprofen not sufficient.   Will try one time tramadol. If needs additional, will have to come back to clinic or get from oncology.  Tramadol 50mg  q6 PRN (number 30) sent to pharmacy.   Curlene Labrum, MD [facility] [address] [place], [place] [address] [phone] (office)

## 2017-11-18 ENCOUNTER — Encounter (HOSPITAL_COMMUNITY): Payer: Self-pay | Admitting: Hematology

## 2017-11-18 ENCOUNTER — Inpatient Hospital Stay (HOSPITAL_COMMUNITY): Payer: Self-pay | Attending: Internal Medicine

## 2017-11-18 ENCOUNTER — Inpatient Hospital Stay (HOSPITAL_COMMUNITY): Payer: Self-pay | Attending: Internal Medicine | Admitting: Hematology

## 2017-11-18 VITALS — BP 98/45 | HR 69 | Temp 97.4°F | Resp 18

## 2017-11-18 VITALS — BP 100/63 | HR 71 | Temp 98.1°F | Resp 16 | Wt 126.8 lb

## 2017-11-18 DIAGNOSIS — C50811 Malignant neoplasm of overlapping sites of right female breast: Secondary | ICD-10-CM | POA: Insufficient documentation

## 2017-11-18 DIAGNOSIS — Z5112 Encounter for antineoplastic immunotherapy: Secondary | ICD-10-CM | POA: Insufficient documentation

## 2017-11-18 DIAGNOSIS — Z9221 Personal history of antineoplastic chemotherapy: Secondary | ICD-10-CM | POA: Insufficient documentation

## 2017-11-18 DIAGNOSIS — Z1501 Genetic susceptibility to malignant neoplasm of breast: Secondary | ICD-10-CM

## 2017-11-18 DIAGNOSIS — Z9011 Acquired absence of right breast and nipple: Secondary | ICD-10-CM

## 2017-11-18 DIAGNOSIS — Z87891 Personal history of nicotine dependence: Secondary | ICD-10-CM | POA: Insufficient documentation

## 2017-11-18 DIAGNOSIS — G629 Polyneuropathy, unspecified: Secondary | ICD-10-CM

## 2017-11-18 DIAGNOSIS — Z79899 Other long term (current) drug therapy: Secondary | ICD-10-CM | POA: Insufficient documentation

## 2017-11-18 DIAGNOSIS — Z170 Estrogen receptor positive status [ER+]: Secondary | ICD-10-CM | POA: Insufficient documentation

## 2017-11-18 LAB — CBC WITH DIFFERENTIAL/PLATELET
BASOS ABS: 0 10*3/uL (ref 0.0–0.1)
Basophils Relative: 0 %
EOS ABS: 0.1 10*3/uL (ref 0.0–0.7)
EOS PCT: 2 %
HCT: 40.6 % (ref 36.0–46.0)
Hemoglobin: 13.2 g/dL (ref 12.0–15.0)
Lymphocytes Relative: 24 %
Lymphs Abs: 1.5 10*3/uL (ref 0.7–4.0)
MCH: 31.6 pg (ref 26.0–34.0)
MCHC: 32.5 g/dL (ref 30.0–36.0)
MCV: 97.1 fL (ref 78.0–100.0)
MONO ABS: 0.4 10*3/uL (ref 0.1–1.0)
Monocytes Relative: 6 %
Neutro Abs: 4.1 10*3/uL (ref 1.7–7.7)
Neutrophils Relative %: 68 %
PLATELETS: 269 10*3/uL (ref 150–400)
RBC: 4.18 MIL/uL (ref 3.87–5.11)
RDW: 12.8 % (ref 11.5–15.5)
WBC: 6 10*3/uL (ref 4.0–10.5)

## 2017-11-18 LAB — COMPREHENSIVE METABOLIC PANEL
ALK PHOS: 92 U/L (ref 38–126)
ALT: 20 U/L (ref 14–54)
AST: 22 U/L (ref 15–41)
Albumin: 4 g/dL (ref 3.5–5.0)
Anion gap: 10 (ref 5–15)
BILIRUBIN TOTAL: 0.8 mg/dL (ref 0.3–1.2)
BUN: 12 mg/dL (ref 6–20)
CO2: 26 mmol/L (ref 22–32)
CREATININE: 0.78 mg/dL (ref 0.44–1.00)
Calcium: 9.6 mg/dL (ref 8.9–10.3)
Chloride: 101 mmol/L (ref 101–111)
GFR calc Af Amer: >60 mL/min (ref >60)
GLUCOSE: 92 mg/dL (ref 65–99)
Potassium: 3.9 mmol/L (ref 3.5–5.1)
Sodium: 137 mmol/L (ref 135–145)
TOTAL PROTEIN: 7.8 g/dL (ref 6.5–8.1)

## 2017-11-18 MED ORDER — DIPHENHYDRAMINE HCL 25 MG PO CAPS
ORAL_CAPSULE | ORAL | Status: AC
  Filled 2017-11-18: qty 1

## 2017-11-18 MED ORDER — DIPHENHYDRAMINE HCL 25 MG PO CAPS
50.0000 mg | ORAL_CAPSULE | Freq: Once | ORAL | Status: AC
  Administered 2017-11-18: 25 mg via ORAL

## 2017-11-18 MED ORDER — SODIUM CHLORIDE 0.9 % IV SOLN
Freq: Once | INTRAVENOUS | Status: AC
  Administered 2017-11-18: 12:00:00 via INTRAVENOUS

## 2017-11-18 MED ORDER — HEPARIN SOD (PORK) LOCK FLUSH 100 UNIT/ML IV SOLN
500.0000 [IU] | Freq: Once | INTRAVENOUS | Status: AC | PRN
  Administered 2017-11-18: 500 [IU]

## 2017-11-18 MED ORDER — ACETAMINOPHEN 325 MG PO TABS
ORAL_TABLET | ORAL | Status: AC
  Filled 2017-11-18: qty 2

## 2017-11-18 MED ORDER — SODIUM CHLORIDE 0.9 % IV SOLN
420.0000 mg | Freq: Once | INTRAVENOUS | Status: AC
  Administered 2017-11-18: 420 mg via INTRAVENOUS
  Filled 2017-11-18: qty 14

## 2017-11-18 MED ORDER — ACETAMINOPHEN 325 MG PO TABS
650.0000 mg | ORAL_TABLET | Freq: Once | ORAL | Status: AC
  Administered 2017-11-18: 650 mg via ORAL

## 2017-11-18 MED ORDER — SODIUM CHLORIDE 0.9 % IV SOLN
6.0000 mg/kg | Freq: Once | INTRAVENOUS | Status: AC
  Administered 2017-11-18: 336 mg via INTRAVENOUS
  Filled 2017-11-18: qty 16

## 2017-11-18 MED ORDER — SODIUM CHLORIDE 0.9% FLUSH
10.0000 mL | INTRAVENOUS | Status: DC | PRN
  Administered 2017-11-18: 10 mL
  Filled 2017-11-18: qty 10

## 2017-11-18 NOTE — Progress Notes (Signed)
Angel Hale, [place] [address]   CLINIC:  Medical Oncology/Hematology  PCP:  Patient, No Pcp Per No address on file None   REASON FOR VISIT:  Follow-up for right breast cancer.  CURRENT THERAPY: Maintenance per his member and Herceptin.  BRIEF ONCOLOGIC HISTORY:    Malignant neoplasm of overlapping sites of right breast in female, estrogen receptor positive ([place])   05/12/2017 Initial Biopsy    (R) breast (LIQ): IDC, grade 3; ER+ (80%), PR+ (10%), HER2+ (ratio 2.56). Ki67 80%.        05/12/2017 Procedure    Additional (R) breast biopsy (UOQ): IDC, grade 3; ER+ (80%), PR+ (50%), HER2+ (ratio 2.77), Ki67 90%.       05/12/2017 Procedure    (R) axillary lymph node biopsy: Metastatic carcinoma; ER+ (80%), PR+ (30%), HER2+ (ratio 2.99), Ki67 80%.       05/12/2017 Imaging    Mammogram/breast US: Targeted ultrasound is performed, showing multiple irregular hypoechoic masses. In the 6 o'clock region of the right breast is a large irregular mass spans at least 4.8 cm. In the 4:00 to 5:00 region of the right breast an irregular mass measures 4.5 cm. In the 930-10 o'clock position of the right breast centered 9-10 cm from the nipple is an irregular 4.0 x 3.8 x 1.9 cm hypoechoic mass. In the 9:30 position of the right breast at the lateral margin is a probable abnormal intramammary lymph node measuring 1.3 cm greatest diameter. In the 10 o'clock position of the right breast far lateral is a hypoechoic dermal nodule that measures 0.5 cm. Diffuse skin thickening of the right breast measures up to 4.5 mm.      05/14/2017 Tumor Marker      Ref. Range 05/14/2017 15:30  CA 15-3 Latest Ref Range: 0.0 - 25.0 U/mL 115.9 (H)     Ref. Range 05/14/2017 15:30  CA 27.29 Latest Ref Range: 0.0 - 38.6 U/mL 157.7 (H)        05/15/2017 Imaging    CT chest/abd/pelvis:  IMPRESSION: 1. Numerous right breast masses and metastatic right axillary lymphadenopathy. Findings  suspicious for chest wall invasion along the lower lateral aspect of the breast. 2. Two small indeterminate right lower lobe pulmonary nodules will require surveillance. 3. No findings for metastatic disease involving the abdomen/pelvis or osseous structures.      05/15/2017 Imaging    Bone scan:  IMPRESSION: No evidence of metastatic disease.      05/18/2017 Echocardiogram    LV EF: 60% -   65%      05/20/2017 Procedure    Port-a-cath placement       05/25/2017 Initial Diagnosis    Malignant neoplasm of overlapping sites of right breast in female, estrogen receptor positive ([place])      05/27/2017 - 09/10/2017 Neo-Adjuvant Chemotherapy    TCHPx 6 cycles      10/14/2017 Surgery    Right modified radical mastectomy and lymph node biopsy on 10/14/2017, with complete pathological response, [id number]        CANCER STAGING: Cancer Staging Malignant neoplasm of overlapping sites of right breast in female, estrogen receptor positive ([place]) Staging form: Breast, AJCC 8th Edition - Clinical stage from 05/25/2017: Stage IIIB (cT4b, cN3a, cM0, G3, ER: Positive, PR: Positive, HER2: Positive) - Signed by Holley Bouche, NP on 07/07/2017  Malignant neoplasm of overlapping sites of right female breast [facility]) Staging form: Breast, AJCC 8th Edition - Clinical: No Stage Recommended (ycT4b, cN3a, cM0,  G3, ER: Positive, PR: Positive, HER2: Positive) - Signed by Twana First, MD on 05/25/2017    INTERVAL HISTORY:  Angel Hale 47 y.o. female returns for routine follow-up and consideration for next cycle of Herceptin and Pertuzumab.  She denies any symptoms of PND or orthopnea.  She has recovered well from surgery.  Her neuropathy in the feet has improved.  She has on and off numbness in the fingertips.  She denies any nausea vomiting or diarrhea.  Her only family member with any malignancy is her father with colon cancer.   REVIEW OF SYSTEMS:  Review of Systems  Constitutional: Negative.     HENT:  Negative.   Respiratory: Negative.   Cardiovascular: Negative.   Gastrointestinal: Negative.   Genitourinary: Negative.    Musculoskeletal: Negative.   Skin: Negative.   Neurological: Positive for numbness (On and off in the fingertips).  Hematological: Negative.   Psychiatric/Behavioral: Negative.      PAST MEDICAL/SURGICAL HISTORY:  Past Medical History:  Diagnosis Date  . Cancer [facility])    breast cancer   Past Surgical History:  Procedure Laterality Date  . BUNIONECTOMY Left 2000  . EXTERNAL FIXATION LEG Left 02/16/2017   Procedure: EXTERNAL FIXATION LEG;  Surgeon: Mcarthur Rossetti, MD;  Location: [facility];  Service: Orthopedics;  Laterality: Left;  . HARDWARE REMOVAL Left 05/21/2017   Procedure: REMOVAL OF RETAINED PROMINENT SCREW LEFT ANKLE;  Surgeon: Mcarthur Rossetti, MD;  Location: [facility];  Service: Orthopedics;  Laterality: Left;  Marland Kitchen MASTECTOMY MODIFIED RADICAL Right 10/14/2017   Procedure: MASTECTOMY MODIFIED RADICAL;  Surgeon: Virl Cagey, MD;  Location: [facility];  Service: General;  Laterality: Right;  . ORIF ANKLE FRACTURE Left 03/03/2017  . ORIF ANKLE FRACTURE Left 03/03/2017   Procedure: OPEN REDUCTION INTERNAL FIXATION (ORIF) LEFT PILON FRACTURE AND LEFT LATERAL MALLEOLUS;  Surgeon: Mcarthur Rossetti, MD;  Location: [facility];  Service: Orthopedics;  Laterality: Left;  . OVARIAN CYST REMOVAL Left 1991  . PORTACATH PLACEMENT    . PORTACATH PLACEMENT Left 05/20/2017   Procedure: INSERTION PORT-A-CATH;  Surgeon: Virl Cagey, MD;  Location: [facility];  Service: General;  Laterality: Left;  . WISDOM TOOTH EXTRACTION       SOCIAL HISTORY:  Social History   Socioeconomic History  . Marital status: Married    Spouse name: Not on file  . Number of children: Not on file  . Years of education: Not on file  . Highest education level: Not on file  Occupational History  . Not on file  Social Needs  . Financial resource strain: Not on file  .  Food insecurity:    Worry: Not on file    Inability: Not on file  . Transportation needs:    Medical: Not on file    Non-medical: Not on file  Tobacco Use  . Smoking status: Former Smoker    Packs/day: 1.00    Years: 10.00    Pack years: 10.00    Types: Cigarettes    Last attempt to quit: 02/16/2017    Years since quitting: 0.7  . Smokeless tobacco: Never Used  Substance and Sexual Activity  . Alcohol use: Yes    Comment: occasional  . Drug use: No  . Sexual activity: Not Currently    Birth control/protection: None  Lifestyle  . Physical activity:    Days per week: Not on file    Minutes per session: Not on file  . Stress: Not on file  Relationships  .  Social connections:    Talks on phone: Not on file    Gets together: Not on file    Attends religious service: Not on file    Active member of club or organization: Not on file    Attends meetings of clubs or organizations: Not on file    Relationship status: Not on file  . Intimate partner violence:    Fear of current or ex partner: Not on file    Emotionally abused: Not on file    Physically abused: Not on file    Forced sexual activity: Not on file  Other Topics Concern  . Not on file  Social History Narrative  . Not on file    FAMILY HISTORY:  Family History  Problem Relation Age of Onset  . Colon cancer Father     CURRENT MEDICATIONS:  Outpatient Encounter Medications as of 11/18/2017  Medication Sig  . acetaminophen (TYLENOL) 500 MG tablet Take 1,000 mg by mouth every 6 (six) hours as needed (for pain.).  Marland Kitchen Coenzyme Q10 (CO Q-10 PO) Take 1 tablet by mouth daily.  . Cyanocobalamin (B-12 PO) Take 1 tablet by mouth daily.  Marland Kitchen docusate sodium (COLACE) 100 MG capsule Take 1 capsule (100 mg total) by mouth 2 (two) times daily as needed for mild constipation.  . gabapentin (NEURONTIN) 100 MG capsule Take 1 capsule daily on first day, then 2 times a day second day, then 3 times a day from there out.  . Homeopathic  Products (FRANKINCENSE UPLIFTING) OIL Take 1 capsule by mouth daily as needed (for home remedy).  Marland Kitchen LAVENDER OIL PO Take 1 capsule by mouth daily as needed (for home remedy).  Marland Kitchen lidocaine-prilocaine (EMLA) cream Apply to affected area once  . LORazepam (ATIVAN) 0.5 MG tablet Take 1 tablet (0.5 mg total) by mouth every 6 (six) hours as needed (Nausea or vomiting).  . methocarbamol (ROBAXIN) 500 MG tablet Take 1 tablet (500 mg total) by mouth every 6 (six) hours as needed for muscle spasms.  . Multiple Vitamins-Minerals (ZINC PO) Take 1 capsule by mouth daily.  . ondansetron (ZOFRAN) 8 MG tablet Take 1 tablet (8 mg total) by mouth 2 (two) times daily as needed for refractory nausea / vomiting. Start on day 3 after chemo.  Marland Kitchen oxyCODONE (OXY IR/ROXICODONE) 5 MG immediate release tablet Take 1 tablet (5 mg total) by mouth every 4 (four) hours as needed for severe pain or breakthrough pain.  Marland Kitchen Pertuzumab (PERJETA IV) Inject into the vein. Every 3 weeks  . prochlorperazine (COMPAZINE) 10 MG tablet Take 1 tablet (10 mg total) by mouth every 6 (six) hours as needed (Nausea or vomiting).  . traMADol (ULTRAM) 50 MG tablet Take 1 tablet (50 mg total) by mouth every 6 (six) hours as needed for severe pain.  . Trastuzumab (HERCEPTIN IV) Inject into the vein. Every 3 weeks  . vitamin C (ASCORBIC ACID) 500 MG tablet Take 1 tablet (500 mg total) by mouth daily.   No facility-administered encounter medications on file as of 11/18/2017.     ALLERGIES:  Allergies  Allergen Reactions  . Penicillins Nausea And Vomiting    Has patient had a PCN reaction causing immediate rash, facial/tongue/throat swelling, SOB or lightheadedness with hypotension: No Has patient had a PCN reaction causing severe rash involving mucus membranes or skin necrosis: No Has patient had a PCN reaction that required hospitalization: No Has patient had a PCN reaction occurring within the last 10 years: No If all of the  above answers are "NO",  then may proceed with Cephalosporin use.      PHYSICAL EXAM:  ECOG Performance status: 1  Vitals:   11/18/17 0938  BP: 100/63  Pulse: 71  Resp: 16  Temp: 98.1 F (36.7 C)  SpO2: 100%   Filed Weights   11/18/17 0938  Weight: 126 lb 12.8 oz (57.5 kg)      LABORATORY DATA:  I have reviewed the labs as listed.  CBC    Component Value Date/Time   WBC 6.0 11/18/2017 0955   RBC 4.18 11/18/2017 0955   HGB 13.2 11/18/2017 0955   HCT 40.6 11/18/2017 0955   PLT 269 11/18/2017 0955   MCV 97.1 11/18/2017 0955   MCH 31.6 11/18/2017 0955   MCHC 32.5 11/18/2017 0955   RDW 12.8 11/18/2017 0955   LYMPHSABS 1.5 11/18/2017 0955   MONOABS 0.4 11/18/2017 0955   EOSABS 0.1 11/18/2017 0955   BASOSABS 0.0 11/18/2017 0955   CMP Latest Ref Rng & Units 11/18/2017 10/15/2017 10/09/2017  Glucose 65 - 99 mg/dL 92 106(H) 88  BUN 6 - 20 mg/dL _0 Creatinine 0.44 - 1.00 mg/dL 0.78 0.61 0.63  Sodium 135 - 145 mmol/L 137 139 137  Potassium 3.5 - 5.1 mmol/L 3.9 3.4(L) 4.1  Chloride 101 - 111 mmol/L 101 103 102  CO2 22 - 32 mmol/L _1 Calcium 8.9 - 10.3 mg/dL 9.6 8.7(L) 9.4  Total Protein 6.5 - 8.1 g/dL 7.8 - -  Total Bilirubin 0.3 - 1.2 mg/dL 0.8 - -  Alkaline Phos 38 - 126 U/L 92 - -  AST 15 - 41 U/L 22 - -  ALT 14 - 54 U/L 20 - -       DIAGNOSTIC IMAGING:  I have reviewed MRI which was done upon completion of neoadjuvant chemotherapy and agree with radiology report.    ASSESSMENT & PLAN:   Malignant neoplasm of overlapping sites of right female breast ([place]) 1.  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  I discussed the results of the pathology and the importance of pCR in the setting.  As she has a high risk disease (based on SOFT and TEXT trails), I have recommended ovarian ablation with the aromatase inhibitor therapy.  Patient is pre-menopausal.  We  discussed the option of surgical oophorectomy versus using LHRH agonists.  She would like to think about the surgical option.  In the interim, we will start her on Zoladex 3.6 mg subcu every 28 days.  I will also place a referral for radiation oncology consultation. She will proceed with Herceptin and Pertuzumab today.  Given her young age at diagnosis, I have also recommended genetic evaluation and testing.  2.  High risk drug monitoring for Herceptin: Her last 2D echocardiogram was in December 2018 which showed a normal ejection fraction.  I have ordered a surveillance echocardiogram today.  She does not have any clinical signs or symptoms of congestive heart failure.  3.  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  She does have on and off numbness in the fingertips.  Even though she was prescribed gabapentin 100 mg 3 times daily, she never took it because of concern for side effects.      Orders placed this encounter:  Orders Placed This Encounter  Procedures  . ECHOCARDIOGRAM COMPLETE      Derek Jack, [place] [phone]

## 2017-11-18 NOTE — Patient Instructions (Signed)
[facility] Discharge Instructions  You saw Dr. K today.   Thank you for choosing [facility] at [facility] to provide your oncology and hematology care.  To afford each patient quality time with our provider, please arrive at least 15 minutes before your scheduled appointment time.   If you have a lab appointment with the Cancer Center please come in thru the  Main Entrance and check in at the main information desk  You need to re-schedule your appointment should you arrive 10 or more minutes late.  We strive to give you quality time with our providers, and arriving late affects you and other patients whose appointments are after yours.  Also, if you no show three or more times for appointments you may be dismissed from the clinic at the providers discretion.     Again, thank you for choosing [facility].  Our hope is that these requests will decrease the amount of time that you wait before being seen by our physicians.       _____________________________________________________________  Should you have questions after your visit to [facility], please contact our office at [phone] between the hours of 8:30 a.m. and 4:30 p.m.  Voicemails left after 4:30 p.m. will not be returned until the following business day.  For prescription refill requests, have your pharmacy contact our office.       Resources For Cancer Patients and their Caregivers ? American Cancer Society: Can assist with transportation, wigs, general needs, runs Look Good Feel Better.        1-888-227-6333 ? Cancer Care: Provides financial assistance, online support groups, medication/co-pay assistance.  1-800-813-HOPE (4673) ? [facility] Assists [place] cancer patients and their families through emotional , educational and financial support.  336-427-4357 ? [place] DSS Where to apply for food  stamps, Medicaid and utility assistance. 336-342-1394 ? [facility]: Transportation to medical appointments. 336-347-2287 ? Social Security Administration: May apply for disability if have a Stage IV cancer. 336-342-7796 1-800-772-1213 ? [place] Aging, Disability and Transit Services: Assists with nutrition, care and transit needs. 336-349-2343  Cancer Center Support Programs:   > Cancer Support Group  2nd Tuesday of the month 1pm-2pm, Journey Room   > Creative Journey  3rd Tuesday of the month 1130am-1pm, Journey Room     

## 2017-11-18 NOTE — Progress Notes (Signed)
Labs drawn today.   Treatment given per orders. Patient tolerated it well without problems. Vitals stable and discharged home from clinic ambulatory. Follow up as scheduled.

## 2017-11-18 NOTE — Progress Notes (Signed)
Location of Breast Cancer: Right Breast  Histology per Pathology Report:  05/12/18 Diagnosis 1. Breast, right, needle core biopsy, medial lower inner quadrant - INVASIVE DUCTAL CARCINOMA, GRADE 3. - SEE MICROSCOPIC DESCRIPTION. Receptor Status: ER (80%), PR (10%), Her 2 (POS), Ki- (80%) 2. Breast, right, needle core biopsy, upper outer quadrant - INVASIVE DUCTAL CARCINOMA, GRADE 3. - SEE MICROSCOPIC DESCRIPTION. Receptor Status: ER (80%), PR (50%), Her 2 (POS), Ki- (90%) 3. Lymph node, needle/core biopsy, right axillary - LYMPH NODE WITH METASTATIC CARCINOMA. Receptor Status: ER(80%), PR (30%), Her2-neu (POS), Ki-(80%)  10/14/17 Diagnosis Breast, modified radical mastectomy , right, with axillary contents - NO RESIDUAL CARCINOMA IDENTIFIED. - RESECTION MARGINS ARE NEGATIVE. - FIBROCYSTIC CHANGE AND FIBROADENOMATOID CHANGE. - BIOPSY SITE. - NO CARCINOMA IN ELEVEN OF ELEVEN LYMPH NODES (0/11). - SEE ONCOLOGY TABLE.  Did patient present with symptoms or was this found on screening mammography?: She had a right breast lesion present for several years. She thought it was a cyst and did not bring it to a physicians attention.   Past/Anticipated interventions by surgeon, if any: 10/14/17 Procedure(s) Performed:  Modified radical mastectomy  Surgeon: Lanell Matar. Constance Haw, MD Assistants: Aviva Signs, MD  [facility] surgical associates)   Past/Anticipated interventions by medical oncology, if any:  11/18/17 Dr. Delton Coombes at [facility] 1.  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  I discussed the results of the pathology and the importance of pCR in the setting.  As she has a high risk disease (based on SOFT and TEXT trails), I have recommended ovarian ablation with the aromatase inhibitor therapy (preferably exemestane).  Patient is pre-menopausal.  We discussed the option of  surgical oophorectomy versus using LHRH agonists.  She would like to think about the surgical option.  In the interim, we will start her on Zoladex 3.6 mg subcu every 28 days.  I will also place a referral for radiation oncology consultation. She will proceed with Herceptin and Pertuzumab today.  Given her young age at diagnosis, I have also recommended genetic evaluation and testing. 2.  High risk drug monitoring for Herceptin: Her last 2D echocardiogram was in December 2018 which showed a normal ejection fraction.  I have ordered a surveillance echocardiogram today.  She does not have any clinical signs or symptoms of congestive heart failure. 3.  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  She does have on and off numbness in the fingertips.  Even though she was prescribed gabapentin 100 mg 3 times daily, she never took it because of concern for side effects   Lymphedema issues, if any:  She denies. She has good arm mobility.   Pain issues, if any:  She has pain to her Left leg from her previous injury 02/2017  SAFETY ISSUES:  Prior radiation? No  Pacemaker/ICD? No  Possible current pregnancy? No. She is not having periods.   Is the patient on methotrexate? No  Current Complaints / other details:   BP 111/66   Pulse 67   Temp 97.9 F (36.6 C)   Resp 16   Ht '5\' 5"'  (1.651 m)   Wt 129 lb 9.6 oz (58.8 kg)   SpO2 100% Comment: room air  BMI 21.57 kg/m    Wt Readings from Last 3 Encounters:  11/25/17 129 lb 9.6 oz (58.8 kg)  11/18/17 126 lb 12.8 oz (57.5 kg)  11/05/17 130 lb (59 kg)  Luian Schumpert, Stephani Police, RN 11/18/2017,3:11 PM

## 2017-11-18 NOTE — Assessment & Plan Note (Addendum)
1.  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  I discussed the results of the pathology and the importance of pCR in the setting.  As she has a high risk disease (based on SOFT and TEXT trails), I have recommended ovarian ablation with the aromatase inhibitor therapy (preferably exemestane).  Patient is pre-menopausal.  We discussed the option of surgical oophorectomy versus using LHRH agonists.  She would like to think about the surgical option.  In the interim, we will start her on Zoladex 3.6 mg subcu every 28 days.  I will also place a referral for radiation oncology consultation. She will proceed with Herceptin and Pertuzumab today.  Given her young age at diagnosis, I have also recommended genetic evaluation and testing.  2.  High risk drug monitoring for Herceptin: Her last 2D echocardiogram was in December 2018 which showed a normal ejection fraction.  I have ordered a surveillance echocardiogram today.  She does not have any clinical signs or symptoms of congestive heart failure.  3.  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  She does have on and off numbness in the fingertips.  Even though she was prescribed gabapentin 100 mg 3 times daily, she never took it because of concern for side effects.

## 2017-11-19 ENCOUNTER — Telehealth (HOSPITAL_COMMUNITY): Payer: Self-pay | Admitting: Hematology

## 2017-11-19 ENCOUNTER — Encounter: Payer: Self-pay | Admitting: Radiation Oncology

## 2017-11-19 ENCOUNTER — Telehealth: Payer: Self-pay | Admitting: General Surgery

## 2017-11-19 NOTE — Telephone Encounter (Signed)
PC to [facility] for [facility]. Questioned if she knew the status of pts [facility] App. Per Tito Dine app is still pending and she will help pt apply for medicaid. [facility] app will not be Approved/denied until medicaid status is determined.

## 2017-11-19 NOTE — Telephone Encounter (Signed)
[facility]  Patient told about SOFT/ TEXT trials. Discussed options of oophorectomy and LHRH and the risk reduction with the ovarian ablation and AI treatment.   Talked about the trials and the risks. Talked about the options.   She is going to continue to think about things. Has not had a period since September since her chemotherapy started, she has been getting some hot flashes already. I wonder if she is already going through some degree of menopause. Her mom died at 76, and her grandmother had a hysterectomy around 34ish for unknown reasons.  So she has no idea when she will be expected to go through menopause.  Patient going to continue to think about things.  Curlene Labrum, MD [facility] [address] [place], [place] [phone] (office)

## 2017-11-20 ENCOUNTER — Encounter (HOSPITAL_COMMUNITY): Payer: Self-pay | Admitting: Occupational Therapy

## 2017-11-20 ENCOUNTER — Ambulatory Visit (HOSPITAL_COMMUNITY): Payer: Self-pay | Admitting: Occupational Therapy

## 2017-11-20 DIAGNOSIS — M25611 Stiffness of right shoulder, not elsewhere classified: Secondary | ICD-10-CM

## 2017-11-20 DIAGNOSIS — R29898 Other symptoms and signs involving the musculoskeletal system: Secondary | ICD-10-CM

## 2017-11-20 NOTE — Therapy (Signed)
[place], [place], [address] Phone: [phone]   Fax:  [phone]  Occupational Therapy Treatment  Patient Details  Name: Angel Hale MRN: [id number] Date of Birth: 1971/02/08 Referring Provider: Dr. Zoila Shutter   Encounter Date: 11/20/2017  OT End of Session - 11/20/17 1043    Visit Number  4    Number of Visits  5    Date for OT Re-Evaluation  11/28/17    Authorization Type  Self-pay; pt has applied for Medicaid and [place] program    OT Start Time  0945    OT Stop Time  1033    OT Time Calculation (min)  48 min    Activity Tolerance  Patient tolerated treatment well    Behavior During Therapy  [facility] for tasks assessed/performed       Past Medical History:  Diagnosis Date  . Cancer [facility])    breast cancer    Past Surgical History:  Procedure Laterality Date  . BUNIONECTOMY Left 2000  . EXTERNAL FIXATION LEG Left 02/16/2017   Procedure: EXTERNAL FIXATION LEG;  Surgeon: Mcarthur Rossetti, MD;  Location: [place];  Service: Orthopedics;  Laterality: Left;  . HARDWARE REMOVAL Left 05/21/2017   Procedure: REMOVAL OF RETAINED PROMINENT SCREW LEFT ANKLE;  Surgeon: Mcarthur Rossetti, MD;  Location: [place];  Service: Orthopedics;  Laterality: Left;  Marland Kitchen MASTECTOMY MODIFIED RADICAL Right 10/14/2017   Procedure: MASTECTOMY MODIFIED RADICAL;  Surgeon: Virl Cagey, MD;  Location: [facility];  Service: General;  Laterality: Right;  . ORIF ANKLE FRACTURE Left 03/03/2017  . ORIF ANKLE FRACTURE Left 03/03/2017   Procedure: OPEN REDUCTION INTERNAL FIXATION (ORIF) LEFT PILON FRACTURE AND LEFT LATERAL MALLEOLUS;  Surgeon: Mcarthur Rossetti, MD;  Location: [place];  Service: Orthopedics;  Laterality: Left;  . OVARIAN CYST REMOVAL Left 1991  . PORTACATH PLACEMENT    . PORTACATH PLACEMENT Left 05/20/2017   Procedure: INSERTION PORT-A-CATH;  Surgeon: Virl Cagey, MD;  Location: [facility];   Service: General;  Laterality: Left;  . WISDOM TOOTH EXTRACTION      There were no vitals filed for this visit.  Subjective Assessment - 11/20/17 0935    Subjective   S: I'm so glad my arm is doing so well.     Patient is accompained by:  Family member husband    Currently in Pain?  No/denies         [facility] OT Assessment - 11/20/17 0935      Assessment   Medical Diagnosis  s/p right mastectomy      Precautions   Precautions  Other (comment)    Precaution Comments  s/p mastectomy               OT Treatments/Exercises (OP) - 11/20/17 0945      Exercises   Exercises  Shoulder      Shoulder Exercises: Supine   Protraction  PROM;5 reps;Strengthening;12 reps    Protraction Weight (lbs)  2    Horizontal ABduction  PROM;5 reps;Strengthening;12 reps    Horizontal ABduction Weight (lbs)  2    External Rotation  PROM;5 reps;Strengthening;12 reps    External Rotation Weight (lbs)  2    Internal Rotation  PROM;5 reps;Strengthening;12 reps    Internal Rotation Weight (lbs)  2    Flexion  PROM;5 reps;Strengthening;12 reps    Shoulder Flexion Weight (lbs)  2    ABduction  PROM;5 reps;Strengthening;12 reps  Shoulder ABduction Weight (lbs)  2      Shoulder Exercises: Seated   Horizontal ABduction  Theraband;12 reps palms up and palms down    Theraband Level (Shoulder Horizontal ABduction)  Level 2 (Red)    External Rotation  Theraband;12 reps    Theraband Level (Shoulder External Rotation)  Level 2 (Red)    Internal Rotation  Theraband;10 reps    Theraband Level (Shoulder Internal Rotation)  Level 2 (Red)    Diagonals  Theraband;12 reps    Theraband Level (Shoulder Diagonals)  Level 2 (Red)    Other Seated Exercises  green therapy ball: chest press, flexion, chest press, PNF patterns      Shoulder Exercises: Standing   Extension  Theraband;10 reps    Theraband Level (Shoulder Extension)  Level 2 (Red)    Row  Theraband;10 reps    Theraband Level (Shoulder Row)  Level  2 (Red)    Retraction  Theraband;10 reps    Theraband Level (Shoulder Retraction)  Level 2 (Red)      Manual Therapy   Manual Therapy  Myofascial release    Manual therapy comments  Manual therapy completed prior to exercises.    Myofascial Release  Myofascial release and manual stretching completed to right upper trapezius to increase functional mobility and increase joint mobility in a pain free zone.              OT Education - 11/20/17 1022    Education provided  Yes    Education Details  scapular theraband; provided with information on lymphedema and strategies for identifying/preventing    Person(s) Educated  Patient;Spouse    Methods  Explanation;Demonstration;Handout    Comprehension  Verbalized understanding;Returned demonstration       OT Short Term Goals - 11/06/17 0831      OT SHORT TERM GOAL #1   Title  Pt will be provided with and educated on HEP to improve mobility required for ADLs using RUE as dominant.    Time  4    Period  Weeks    Status  On-going      OT SHORT TERM GOAL #2   Title  Pt will improve RUE A/ROM to WNL to improve ability to reach overhead during daily tasks.     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     Time  4    Period  Weeks    Status  On-going      OT SHORT TERM GOAL #4   Title  Pt will decrease RUE fascial restrictions to minimal amounts or less to improve mobility required for reaching tasks.    Time  4    Period  Weeks    Status  On-going      OT SHORT TERM GOAL #5   Title  Pt will return to highest level of functioning using RUE as dominant during B/IADL tasks.     Time  4    Period  Weeks    Status  On-going               Plan - 11/20/17 1043    Clinical Impression Statement  A: Continued with shoulder strengthening this session, adding green therapy ball exercises for shoulder/scapular  strengthening. Verbal cuing for form and technique. Pt  reports HEP is going well, is concerned about potential for lymphedema. Provided pt with handout on lymphedema and prevention.     Plan  P: Follow up on HEP, provide stress management information. Reassessment       Patient will benefit from skilled therapeutic intervention in order to improve the following deficits and impairments:  Decreased scar mobility, Decreased activity tolerance, Decreased strength, Impaired flexibility, Decreased range of motion, Increased edema, Pain, Impaired UE functional use, Increased fascial restrictions  Visit Diagnosis: Stiffness of right shoulder, not elsewhere classified  Other symptoms and signs involving the musculoskeletal system    Problem List Patient Active Problem List   Diagnosis Date Noted  . Breast cancer ([place]) 10/14/2017  . Malignant neoplasm of overlapping sites of right breast in female, estrogen receptor positive ([place]) 05/25/2017  . Painful orthopaedic hardware ([place]) left ankle 05/21/2017  . Malignant neoplasm of overlapping sites of right female breast ([place]) 05/14/2017  . Breast mass, right 05/12/2017  . Closed left pilon fracture, initial encounter 02/16/2017   Guadelupe Sabin, OTR/L  [phone] 11/20/2017, 10:45 AM  [place], [place], [address] Phone: [phone]   Fax:  [phone]  Name: Angel Hale MRN: [id number] Date of Birth: 11-03-1970

## 2017-11-20 NOTE — Patient Instructions (Signed)

## 2017-11-25 ENCOUNTER — Ambulatory Visit
Admission: RE | Admit: 2017-11-25 | Discharge: 2017-11-25 | Disposition: A | Discharge status: Home / Self Care | Payer: Self-pay | Source: Ambulatory Visit | Attending: Radiation Oncology | Admitting: Radiation Oncology

## 2017-11-25 ENCOUNTER — Other Ambulatory Visit: Payer: Self-pay

## 2017-11-25 ENCOUNTER — Encounter: Payer: Self-pay | Admitting: Radiation Oncology

## 2017-11-25 VITALS — BP 111/66 | HR 67 | Temp 97.9°F | Resp 16 | Ht 65.0 in | Wt 129.6 lb

## 2017-11-25 DIAGNOSIS — Z9889 Other specified postprocedural states: Secondary | ICD-10-CM | POA: Insufficient documentation

## 2017-11-25 DIAGNOSIS — Z170 Estrogen receptor positive status [ER+]: Secondary | ICD-10-CM | POA: Insufficient documentation

## 2017-11-25 DIAGNOSIS — Z9011 Acquired absence of right breast and nipple: Secondary | ICD-10-CM | POA: Insufficient documentation

## 2017-11-25 DIAGNOSIS — Z9221 Personal history of antineoplastic chemotherapy: Secondary | ICD-10-CM | POA: Insufficient documentation

## 2017-11-25 DIAGNOSIS — C50811 Malignant neoplasm of overlapping sites of right female breast: Secondary | ICD-10-CM | POA: Insufficient documentation

## 2017-11-25 DIAGNOSIS — Z87891 Personal history of nicotine dependence: Secondary | ICD-10-CM | POA: Insufficient documentation

## 2017-11-25 DIAGNOSIS — Z1501 Genetic susceptibility to malignant neoplasm of breast: Secondary | ICD-10-CM | POA: Insufficient documentation

## 2017-11-25 DIAGNOSIS — Z79899 Other long term (current) drug therapy: Secondary | ICD-10-CM | POA: Insufficient documentation

## 2017-11-25 DIAGNOSIS — Z510 Encounter for antineoplastic radiation therapy: Secondary | ICD-10-CM | POA: Insufficient documentation

## 2017-11-25 DIAGNOSIS — C50511 Malignant neoplasm of lower-outer quadrant of right female breast: Secondary | ICD-10-CM | POA: Insufficient documentation

## 2017-11-25 DIAGNOSIS — Z800 Family history of malignant neoplasm of digestive organs: Secondary | ICD-10-CM | POA: Insufficient documentation

## 2017-11-25 DIAGNOSIS — G629 Polyneuropathy, unspecified: Secondary | ICD-10-CM | POA: Insufficient documentation

## 2017-11-25 DIAGNOSIS — R590 Localized enlarged lymph nodes: Secondary | ICD-10-CM | POA: Insufficient documentation

## 2017-11-25 DIAGNOSIS — Z79891 Long term (current) use of opiate analgesic: Secondary | ICD-10-CM | POA: Insufficient documentation

## 2017-11-25 NOTE — Progress Notes (Signed)
Radiation Oncology         [phone]) [phone] ________________________________  Initial outpatient Consultation  Name: Angel Hale MRN: [id number]  Date: 11/25/2017  DOB: 1970-12-25  MP:NTIRWER, No Pcp Per  Derek Jack, MD   REFERRING PHYSICIAN: Derek Jack, MD  DIAGNOSIS:    ICD-10-CM   1. Malignant neoplasm of overlapping sites of right breast in female, estrogen receptor positive ([place]) C50.811 Pregnancy, urine   Z17.0   2. 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 female who initially presented with a right breast mass. She is accompanied by her husband today. She lives near [place].   She had a diagnostic mammogram and ultrasonography on 05/12/2017 with results showing: Breast density category C. Locally advanced multicentric neglected right breast cancer with a fungating mass along the inframammary fold and diffuse skin thickening. Right axillary and right infraclavicular pathologic lymphadenopathy  She initially had a right lumpectomy on 05/12/2017 with results showing: Breast, right, needle core biopsy, medial lower inner quadrant with invasive ductal carcinoma, grade 3. Breast, right, needle core biopsy, upper outer quadrant with invasive ductal carcinoma, grade 3. Lymph node, needle/core biopsy, right with lymph node with metastatic carcinoma.   She had a CT CAP w contrast on 05/15/2017 with results of: Numerous right breast masses and metastatic right axillary lymphadenopathy. Findings suspicious for chest wall invasion along the lower lateral aspect of the breast. Two  small indeterminate right lower lobe pulmonary nodules will require surveillance. No findings for metastatic disease involving the abdomen/pelvis or osseous structures.  She then proceeded to a MR bilateral breast on 07/21/2017 with results showing: There is no significant residual enhancement in the right breast status post neoadjuvant chemotherapy of the patient's known multicentric malignancy. In the lower outer quadrant of the right breast, there is abnormal tissue extending from the skin surface to the underlying pectoralis musculature with loss of fat plane. As there is no enhancement post neoadjuvant chemotherapy, I cannot determine whether the musculature itself has been invaded by tumor. There is a prominent irregular right axillary lymph node which may represent the known metastatic right axillary lymph node, however susceptibility artifact is not seen within the lymph node for confirmation. Several abnormal nodes were seen on the diagnostic workup. Note that there are two prominent lymph nodes in the low right axilla, distant from the others, likely around the 8-9:00 axis from the nipple. No MRI evidence of malignancy in the left breast.  She also had another MR bilateral breast on 09/28/2017 with results of: Marked background enhancement limiting evaluation of the breast parenchyma. Subcentimeter enhancing masses in the inner and upper right breast as well as skin thickening with associated enhancement in the region of right inframammary fold suggestive of residual disease. (no abnormal enhancement was seen on prior MRI 07/21/2017). Improvement/resolution of previously seen abnormal lymph nodes in the low right axilla at the approximate 8 to 9:00 position.  She had a right mastectomy on 10/14/2017 that showed: Breast, modified radical mastectomy , right, with axillary contents with no residual carcinoma identified. Resection margins are negative. Fibrocystic change and fibroadenomatoid change.  Biopsy  site. No carcinoma in eleven of eleven lymph nodes (0/11). Prognostic indicators significant for: estrogen receptor, 80% positive and progesterone receptor, 10-50% positive. Proliferation marker Ki67 at 80-90%. HER2 positive.  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 Her husband is a Museum/gallery curator who remodels buildings.   She started neoadjuvant chemotherapy on 05/27/2017 under Dr. Delton Coombes. She is continuing herceptin and perjeta at this time.   On review of systems, she that she has residual peripheral neuropathy following chemotherapy. She also has residual swelling following fractures to her LLE occurring last summer due to a horse accident. She notes that she has healed well and she is able to raise her right arm above her head without any difficulty. She denies CP, HA, SOB, trouble swallowing, bowel/bladder issues, depression, anxiety, and any other symptoms. She quit smoking cigarettes June 2018. She hasn't had a menstrual cycle since October 2018 and she doesn't have any children or any plans to have children at this time.  Denies possible pregnancy, menstruation stopped during chemotherapy. Pregnancy test negative 1.5 mo ago.  PREVIOUS RADIATION THERAPY: No  PAST MEDICAL HISTORY:  has a past medical history of Cancer ([place]).    PAST SURGICAL HISTORY: Past Surgical History:  Procedure Laterality Date  . BUNIONECTOMY Left 2000  . EXTERNAL FIXATION LEG Left 02/16/2017   Procedure: EXTERNAL FIXATION LEG;  Surgeon: Mcarthur Rossetti, MD;  Location: [place];  Service: Orthopedics;  Laterality: Left;  . HARDWARE REMOVAL Left 05/21/2017   Procedure: REMOVAL OF RETAINED PROMINENT SCREW LEFT ANKLE;  Surgeon: Mcarthur Rossetti, MD;  Location: [place];  Service: Orthopedics;  Laterality: Left;  Marland Kitchen MASTECTOMY MODIFIED RADICAL Right 10/14/2017   Procedure: MASTECTOMY  MODIFIED RADICAL;  Surgeon: Virl Cagey, MD;  Location: [facility];  Service: General;  Laterality: Right;  . ORIF ANKLE FRACTURE Left 03/03/2017  . ORIF ANKLE FRACTURE Left 03/03/2017   Procedure: OPEN REDUCTION INTERNAL FIXATION (ORIF) LEFT PILON FRACTURE AND LEFT LATERAL MALLEOLUS;  Surgeon: Mcarthur Rossetti, MD;  Location: [place];  Service: Orthopedics;  Laterality: Left;  . OVARIAN CYST REMOVAL Left 1991  . PORTACATH PLACEMENT    . PORTACATH PLACEMENT Left 05/20/2017   Procedure: INSERTION PORT-A-CATH;  Surgeon: Virl Cagey, MD;  Location: [facility];  Service: General;  Laterality: Left;  . WISDOM TOOTH EXTRACTION      FAMILY HISTORY: family history includes Colon cancer in her father.  SOCIAL HISTORY:  reports that she quit smoking about 9 months ago. Her smoking use included cigarettes. She has a 10.00 pack-year smoking history. She has never used smokeless tobacco. She reports that she drinks alcohol. She reports that she does not use drugs.  ALLERGIES: Penicillins  MEDICATIONS:  Current Outpatient Medications  Medication Sig Dispense Refill  . Homeopathic Products (FRANKINCENSE UPLIFTING) OIL Take 1 capsule by mouth daily as needed (for home remedy, taking orally, not internally).     Marland Kitchen LAVENDER OIL PO Take 1 capsule by mouth daily as needed (for home remedy).    Marland Kitchen lidocaine-prilocaine (EMLA) cream Apply to affected area once 30 g 3  . Pertuzumab (PERJETA IV) Inject into the vein. Every 3 weeks    . traMADol (ULTRAM) 50 MG tablet Take 1 tablet (50 mg total) by mouth every 6 (six) hours as needed for severe pain. 30 tablet 0  . Trastuzumab (HERCEPTIN IV) Inject into the vein. Every  3 weeks    . Coenzyme Q10 (CO Q-10 PO) Take 1 tablet by mouth daily.    . Cyanocobalamin (B-12 PO) Take 1 tablet by mouth daily.    Marland Kitchen LORazepam (ATIVAN) 0.5 MG tablet Take 1 tablet (0.5 mg total) by mouth every 6 (six) hours as needed (Nausea or vomiting). (Patient not taking: Reported on  11/25/2017) 30 tablet 0  . methocarbamol (ROBAXIN) 500 MG tablet Take 1 tablet (500 mg total) by mouth every 6 (six) hours as needed for muscle spasms. (Patient not taking: Reported on 11/25/2017) 40 tablet 1  . Multiple Vitamins-Minerals (ZINC PO) Take 1 capsule by mouth daily.    . ondansetron (ZOFRAN) 8 MG tablet Take 1 tablet (8 mg total) by mouth 2 (two) times daily as needed for refractory nausea / vomiting. Start on day 3 after chemo. (Patient not taking: Reported on 11/25/2017) 30 tablet 1  . prochlorperazine (COMPAZINE) 10 MG tablet Take 1 tablet (10 mg total) by mouth every 6 (six) hours as needed (Nausea or vomiting). (Patient not taking: Reported on 11/25/2017) 30 tablet 1  . vitamin C (ASCORBIC ACID) 500 MG tablet Take 1 tablet (500 mg total) by mouth daily. (Patient not taking: Reported on 11/25/2017) 90 tablet 0   No current facility-administered medications for this encounter.     REVIEW OF SYSTEMS: A 10+ POINT REVIEW OF SYSTEMS WAS OBTAINED including neurology, dermatology, psychiatry, cardiac, respiratory, lymph, extremities, GI, GU, Musculoskeletal, constitutional, breasts, reproductive, HEENT.  All pertinent positives are noted in the HPI.  All others are negative.    PHYSICAL EXAM:  height is '5\' 5"'  (1.651 m) and weight is 129 lb 9.6 oz (58.8 kg). Her temperature is 97.9 F (36.6 C). Her blood pressure is 111/66 and her pulse is 67. Her respiration is 16 and oxygen saturation is 100%.   General: Alert and oriented, in no acute distress HEENT: Head is normocephalic. Extraocular movements are intact. Oropharynx is clear. Neck: Neck is supple, no palpable cervical or supraclavicular lymphadenopathy.  Heart: Regular in rate and rhythm with no murmurs, rubs, or gallops.  Chest: Clear to auscultation bilaterally, with no rhonchi, wheezes, or rales. Left upper chest port-a-cath.  Abdomen: Soft, nontender, nondistended, with no rigidity or guarding. Extremities: No cyanosis or edema. No upper  extremity edema. Good ROM in bilateral shoulders.  Lymphatics: see Neck Exam Skin: No concerning lesions. Musculoskeletal: symmetric strength and muscle tone throughout. Neurologic: Cranial nerves II through XII are grossly intact. No obvious focalities. Speech is fluent. Coordination is intact.  Psychiatric: Judgment and insight are intact. Affect is appropriate. Breasts: No palpable masses in the left breast or left axilla.  Right axillary and mastectomy scar have healed very well. She has a slight amount of erythema along the scars. No palpable masses concerning for recurrence.    ECOG = 0    LABORATORY DATA:  Lab Results  Component Value Date   WBC 6.0 11/18/2017   HGB 13.2 11/18/2017   HCT 40.6 11/18/2017   MCV 97.1 11/18/2017   PLT 269 11/18/2017   CMP     Component Value Date/Time   NA 137 11/18/2017 0955   K 3.9 11/18/2017 0955   CL 101 11/18/2017 0955   CO2 26 11/18/2017 0955   GLUCOSE 92 11/18/2017 0955   BUN 12 11/18/2017 0955   CREATININE 0.78 11/18/2017 0955   CALCIUM 9.6 11/18/2017 0955   PROT 7.8 11/18/2017 0955   ALBUMIN 4.0 11/18/2017 0955   AST 22 11/18/2017 0955  ALT 20 11/18/2017 0955   ALKPHOS 92 11/18/2017 0955   BILITOT 0.8 11/18/2017 0955   GFRNONAA >60 11/18/2017 0955   GFRAA >60 11/18/2017 0955    RADIOGRAPHY: I personally reviewed her images    IMPRESSION/PLAN:  Right breast cancer, locally advanced, excellent response to chemotherapy  It was a pleasure meeting the patient today. We discussed the risks, benefits, and side effects of radiotherapy. I recommend radiotherapy to the chest wall and regional lymph nodes to reduce her risk of locoregional recurrence by 2/3.  We discussed that radiation would take approximately 6-7 weeks to complete. We spoke about acute effects including skin irritation and fatigue as well as much less common late effects including internal organ injury or irritation.  We discussed risk of lymphedema.  We spoke  about the latest technology that is used to minimize the risk of late effects for patients undergoing radiotherapy to the breast or chest wall. No guarantees of treatment were given. The patient is enthusiastic about proceeding with treatment. I look forward to participating in the patient's care.  Consent signed.  We will simulate her today and start her treatments in 1 week. She understands to use condoms for contraception during treatments.  I will order another pregnancy test out of caution. __________________________________________   Eppie Gibson, MD   This document serves as a record of services personally performed by Eppie Gibson, MD. It was created on her behalf by [facility], a trained medical scribe. The creation of this record is based on the scribe's personal observations and the provider's statements to them. This document has been checked and approved by the attending provider.

## 2017-11-26 ENCOUNTER — Ambulatory Visit (HOSPITAL_COMMUNITY): Payer: Self-pay | Attending: Internal Medicine | Admitting: Occupational Therapy

## 2017-11-26 ENCOUNTER — Encounter (HOSPITAL_COMMUNITY): Payer: Self-pay | Admitting: Occupational Therapy

## 2017-11-26 ENCOUNTER — Ambulatory Visit (HOSPITAL_COMMUNITY)
Admission: RE | Admit: 2017-11-26 | Discharge: 2017-11-26 | Disposition: A | Discharge status: Home / Self Care | Payer: Self-pay | Source: Ambulatory Visit | Attending: Hematology | Admitting: Hematology

## 2017-11-26 DIAGNOSIS — Z720 Tobacco use: Secondary | ICD-10-CM | POA: Insufficient documentation

## 2017-11-26 DIAGNOSIS — Z170 Estrogen receptor positive status [ER+]: Secondary | ICD-10-CM | POA: Insufficient documentation

## 2017-11-26 DIAGNOSIS — R29898 Other symptoms and signs involving the musculoskeletal system: Secondary | ICD-10-CM | POA: Insufficient documentation

## 2017-11-26 DIAGNOSIS — C50811 Malignant neoplasm of overlapping sites of right female breast: Secondary | ICD-10-CM | POA: Insufficient documentation

## 2017-11-26 DIAGNOSIS — M25611 Stiffness of right shoulder, not elsewhere classified: Secondary | ICD-10-CM | POA: Insufficient documentation

## 2017-11-26 NOTE — Progress Notes (Signed)
*  PRELIMINARY RESULTS* Echocardiogram 2D Echocardiogram has been performed.  Angel Hale 11/26/2017, 10:41 AM

## 2017-11-26 NOTE — Patient Instructions (Signed)
Theraband strengthening: Complete 10-15X, 1-2X/day  1) Shoulder protraction  Anchor band in doorway, stand with back to door. Push your hand forward as much as you can to bringing your shoulder blades forward on your rib cage.     2) Shoulder flexion  While standing with back to the door, holding Theraband at hand level, raise arm in front of you.  Keep elbow straight through entire movement.      3) Shoulder horizontal abduction  Standing with a theraband anchored at chest height, begin with arm straight and some tension in the band. Move your arm out to your side (keeping straight the whole time). Bring the affected arm back to midline.     4) Shoulder Internal Rotation  While holding an elastic band at your side with your elbow bent, start with your hand away from your stomach, then pull the band towards your stomach. Keep your elbow near your side the entire time.     5) Shoulder External Rotation  While holding an elastic band at your side with your elbow bent, start with your hand near your stomach and then pull the band away. Keep your elbow at your side the entire time.     6) Shoulder abduction  While holding an elastic band at your side, draw up your arm to the side keeping your elbow straight.   

## 2017-11-26 NOTE — Therapy (Signed)
[place], [place], [address] Phone: [phone]   Fax:  [phone]  Occupational Therapy Reassessment, Treatment and Discharge Summary  Patient Details  Name: Angel Hale MRN: [id number] Date of Birth: May 27, 1971 Referring Provider: Dr. Zoila Shutter   Encounter Date: 11/26/2017  OT End of Session - 11/26/17 1244    Visit Number  5    Number of Visits  5    Date for OT Re-Evaluation  11/28/17    Authorization Type  Self-pay; pt has applied for Medicaid and [place] program    OT Start Time  1116    OT Stop Time  1204    OT Time Calculation (min)  48 min    Activity Tolerance  Patient tolerated treatment well    Behavior During Therapy  [facility] for tasks assessed/performed       Past Medical History:  Diagnosis Date  . [facility])    breast cancer    Past Surgical History:  Procedure Laterality Date  . BUNIONECTOMY Left 2000  . EXTERNAL FIXATION LEG Left 02/16/2017   Procedure: EXTERNAL FIXATION LEG;  Surgeon: Mcarthur Rossetti, MD;  Location: [place];  Service: Orthopedics;  Laterality: Left;  . HARDWARE REMOVAL Left 05/21/2017   Procedure: REMOVAL OF RETAINED PROMINENT SCREW LEFT ANKLE;  Surgeon: Mcarthur Rossetti, MD;  Location: [place];  Service: Orthopedics;  Laterality: Left;  Marland Kitchen MASTECTOMY MODIFIED RADICAL Right 10/14/2017   Procedure: MASTECTOMY MODIFIED RADICAL;  Surgeon: Virl Cagey, MD;  Location: [facility];  Service: General;  Laterality: Right;  . ORIF ANKLE FRACTURE Left 03/03/2017  . ORIF ANKLE FRACTURE Left 03/03/2017   Procedure: OPEN REDUCTION INTERNAL FIXATION (ORIF) LEFT PILON FRACTURE AND LEFT LATERAL MALLEOLUS;  Surgeon: Mcarthur Rossetti, MD;  Location: [place];  Service: Orthopedics;  Laterality: Left;  . OVARIAN CYST REMOVAL Left 1991  . PORTACATH PLACEMENT    . PORTACATH PLACEMENT Left 05/20/2017   Procedure: INSERTION PORT-A-CATH;  Surgeon: Virl Cagey, MD;  Location: [facility];  Service: General;  Laterality: Left;  . WISDOM TOOTH EXTRACTION      There were no vitals filed for this visit.  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     Currently in Pain?  No/denies         [facility] OT Assessment - 11/26/17 1116      Assessment   Medical Diagnosis  s/p right mastectomy      Precautions   Precautions  Other (comment)    Precaution Comments  s/p mastectomy      AROM   Overall AROM Comments  Assessed standing, er/IR adducted    AROM Assessment Site  Shoulder    Right/Left Shoulder  Right    Right Shoulder Flexion  180 Degrees 112 previous    Right Shoulder ABduction  180 Degrees 102 previous    Right Shoulder Internal Rotation  90 Degrees same as previous    Right Shoulder External Rotation  90 Degrees 80 previous      PROM   Overall PROM Comments  Assessed supine, er/IR adducted    PROM Assessment Site  Shoulder    Right/Left Shoulder  Right    Right Shoulder Flexion  180 Degrees 120 previous    Right Shoulder ABduction  180 Degrees 98 previous    Right Shoulder Internal Rotation  90 Degrees same as previous    Right Shoulder External Rotation  90 Degrees same as previous      Strength   Overall Strength Comments  Assessed seated, er/IR adducted    Strength Assessment Site  Shoulder    Right/Left Shoulder  Right    Right Shoulder Flexion  5/5 4/5 previous    Right Shoulder ABduction  5/5 4/5 previous    Right Shoulder Internal Rotation  5/5 45 previous    Right Shoulder External Rotation  5/5 4/5 previous               OT Treatments/Exercises (OP) - 11/26/17 1117      ADLs   ADL Comments  Educated pt on energy conservation strategies to utilize during her daily routine to reduce fatigue and conserve energy. Also educated on stress management techniques to incorporate into her routine to reduce stress and fatigue and promote  healthy coping skills.       Exercises   Exercises  Shoulder      Shoulder Exercises: Standing   Protraction  Theraband;10 reps    Theraband Level (Shoulder Protraction)  Level 3 (Green)    Horizontal ABduction  Theraband;10 reps    Theraband Level (Shoulder Horizontal ABduction)  Level 3 (Green)    External Rotation  Theraband;10 reps    Theraband Level (Shoulder External Rotation)  Level 3 (Green)    Internal Rotation  Theraband;10 reps    Theraband Level (Shoulder Internal Rotation)  Level 3 (Green)    Flexion  Theraband;10 reps    Theraband Level (Shoulder Flexion)  Level 3 (Green)    ABduction  Theraband;10 reps    Theraband Level (Shoulder ABduction)  Level 3 (Green)      Manual Therapy   Manual Therapy  Myofascial release    Manual therapy comments  Manual therapy completed prior to exercises.    Myofascial Release  Myofascial release and manual stretching completed to right upper trapezius to increase functional mobility and increase joint mobility in a pain free zone.              OT Education - 11/26/17 1143    Education provided  Yes    Education Details  theraband strengthening; energy conservation; stress management techniques    Person(s) Educated  Patient;Spouse    Methods  Explanation;Demonstration;Handout    Comprehension  Verbalized understanding;Returned demonstration       OT Short Term Goals - 11/26/17 1245      OT SHORT TERM GOAL #1   Title  Pt will be provided with and educated on HEP to improve mobility required for ADLs using RUE as dominant.    Time  4    Period  Weeks    Status  Achieved      OT SHORT TERM GOAL #2   Title  Pt will improve RUE A/ROM to WNL to improve ability to reach overhead during daily tasks.     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     Time  4    Period  Weeks    Status  Achieved      OT  SHORT TERM GOAL #4   Title  Pt will decrease RUE fascial restrictions to minimal amounts or less to improve mobility required  for reaching tasks.    Time  4    Period  Weeks    Status  Achieved      OT SHORT TERM GOAL #5   Title  Pt will return to highest level of functioning using RUE as dominant during B/IADL tasks.     Time  4    Period  Weeks    Status  Achieved               Plan - 11/26/17 1244    Clinical Impression Statement  A: Reassessment completed this session, pt has met all goals and reports she is able to use her RUE as dominant for all daily tasks. Pt reports she is careful with lifting and work tasks, and complete her HEP daily. Provided pt with  support group resources as well as information on energy conservation and stress management. Pt is agreeable to discharge with HEP.     Plan  P: Discharge pt       Patient will benefit from skilled therapeutic intervention in order to improve the following deficits and impairments:  Decreased scar mobility, Decreased activity tolerance, Decreased strength, Impaired flexibility, Decreased range of motion, Increased edema, Pain, Impaired UE functional use, Increased fascial restrictions  Visit Diagnosis: Stiffness of right shoulder, not elsewhere classified  Other symptoms and signs involving the musculoskeletal system    Problem List Patient Active Problem List   Diagnosis Date Noted  . 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 Breast cancer ([place]) 10/14/2017  . Malignant neoplasm of overlapping sites of right breast in female, estrogen receptor positive ([place]) 05/25/2017  . Painful orthopaedic hardware ([place]) left ankle 05/21/2017  . Malignant neoplasm of overlapping sites of right female breast ([place]) 05/14/2017  . Breast mass, right 05/12/2017  . Closed left pilon fracture, initial encounter 02/16/2017   Guadelupe Sabin, OTR/L   [phone] 11/26/2017, 12:49 PM  [place], [place], [address] Phone: [phone]   Fax:  [phone]  Name: Isolde Mathison MRN: [id number] Date of Birth: June 20, 1971   OCCUPATIONAL THERAPY DISCHARGE SUMMARY  Visits from Start of Care: 5  Current functional level related to goals / functional outcomes: See above. Pt is WNL regarding RUE ROM and strength, is using as dominant in all B/IADLs.    Remaining deficits: Sustained activity tolerance   Education / Equipment: HEP on RUE strengthening, stress management, energy conservation Plan: Patient agrees to discharge.  Patient goals were met. Patient is being discharged due to meeting the stated rehab goals.  ?????

## 2017-11-27 NOTE — Progress Notes (Signed)
  Radiation Oncology         [phone] ________________________________  Name: Angel Hale MRN: [id number]  Date: 11/25/2017  DOB: 1971-06-08  SIMULATION AND TREATMENT PLANNING NOTE    Outpatient  DIAGNOSIS:     ICD-10-CM   1. 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 Malignant neoplasm of overlapping sites of right breast in female, estrogen receptor positive ([place]) C50.811    Z17.0     NARRATIVE:  The patient was brought to the [facility].  Identity was confirmed.  All relevant records and images related to the planned course of therapy were reviewed.  The patient freely provided informed written consent to proceed with treatment after reviewing the details related to the planned course of therapy. The consent form was witnessed and verified by the simulation staff.    Then, the patient was set-up in a stable reproducible supine position for radiation therapy with her ipsilateral arm over her head, and her upper body secured in a custom-made Vac-lok device.  CT images were obtained.  Surface markings were placed.  The CT images were loaded into the planning software.    TREATMENT PLANNING NOTE: Treatment planning then occurred.  The radiation prescription was entered and confirmed.     A total of 5 medically necessary complex treatment devices were fabricated and supervised by me: 4 fields with MLCs for custom blocks to protect heart, and lungs;  and, a Vac-lok. MORE COMPLEX DEVICES MAY BE MADE IN DOSIMETRY FOR FIELD IN FIELD BEAMS FOR DOSE HOMOGENEITY.  I have requested : 3D Simulation which is medically necessary to give adequate dose to at risk tissues while sparing lungs and heart.  I have requested a DVH of the following structures: lungs, heart, esophagus, cord.    The patient will receive 50 Gy in 25 fractions to the right chest wall and regional nodes with 4 tangential fields.  This will  be followed by a scar boost.  Optical Surface Tracking Plan:  Since intensity modulated radiotherapy (IMRT) and 3D conformal radiation treatment methods are predicated on accurate and precise positioning for treatment, intrafraction motion monitoring is medically necessary to ensure accurate and safe treatment delivery. The ability to quantify intrafraction motion without excessive ionizing radiation dose can only be performed with optical surface tracking. Accordingly, surface imaging offers the opportunity to obtain 3D measurements of patient position throughout IMRT and 3D treatments without excessive radiation exposure. I am ordering optical surface tracking for this patient's upcoming course of radiotherapy.  ________________________________   Reference:  Ursula Alert, J, et al. Surface imaging-based analysis of intrafraction motion for breast radiotherapy patients.Journal of [place], n. 6, nov. 2014. ISSN [id number].  Available at: <http://www.jacmp.org/index.php/jacmp/article/view/4957>.    -----------------------------------  Eppie Gibson, MD

## 2017-11-30 ENCOUNTER — Ambulatory Visit
Admission: RE | Admit: 2017-11-30 | Discharge: 2017-11-30 | Disposition: A | Discharge status: Home / Self Care | Payer: Self-pay | Source: Ambulatory Visit | Attending: Radiation Oncology | Admitting: Radiation Oncology

## 2017-11-30 DIAGNOSIS — C50511 Malignant neoplasm of lower-outer quadrant of right female breast: Secondary | ICD-10-CM

## 2017-11-30 DIAGNOSIS — Z170 Estrogen receptor positive status [ER+]: Principal | ICD-10-CM

## 2017-11-30 DIAGNOSIS — C50811 Malignant neoplasm of overlapping sites of right female breast: Secondary | ICD-10-CM

## 2017-11-30 LAB — PREGNANCY, URINE: PREG TEST UR: NEGATIVE

## 2017-12-01 ENCOUNTER — Ambulatory Visit: Payer: Self-pay

## 2017-12-02 ENCOUNTER — Ambulatory Visit
Admission: RE | Admit: 2017-12-02 | Discharge: 2017-12-02 | Disposition: A | Discharge status: Home / Self Care | Payer: Self-pay | Source: Ambulatory Visit | Attending: Radiation Oncology | Admitting: Radiation Oncology

## 2017-12-02 DIAGNOSIS — C50811 Malignant neoplasm of overlapping sites of right female breast: Secondary | ICD-10-CM

## 2017-12-02 DIAGNOSIS — Z170 Estrogen receptor positive status [ER+]: Principal | ICD-10-CM

## 2017-12-02 MED ORDER — RADIAPLEXRX EX GEL
Freq: Once | CUTANEOUS | Status: AC
  Administered 2017-12-02: 10:00:00 via TOPICAL

## 2017-12-02 MED ORDER — ALRA NON-METALLIC DEODORANT (RAD-ONC)
1.0000 "application " | Freq: Once | TOPICAL | Status: AC
  Administered 2017-12-02: 1 via TOPICAL

## 2017-12-02 NOTE — Progress Notes (Signed)

## 2017-12-03 ENCOUNTER — Encounter (HOSPITAL_COMMUNITY): Payer: Self-pay | Admitting: Genetic Counselor

## 2017-12-03 ENCOUNTER — Other Ambulatory Visit (HOSPITAL_COMMUNITY): Payer: Self-pay

## 2017-12-03 ENCOUNTER — Ambulatory Visit
Admission: RE | Admit: 2017-12-03 | Discharge: 2017-12-03 | Disposition: A | Discharge status: Home / Self Care | Payer: Self-pay | Source: Ambulatory Visit | Attending: Radiation Oncology | Admitting: Radiation Oncology

## 2017-12-04 ENCOUNTER — Ambulatory Visit
Admission: RE | Admit: 2017-12-04 | Discharge: 2017-12-04 | Disposition: A | Discharge status: Home / Self Care | Payer: Self-pay | Source: Ambulatory Visit | Attending: Radiation Oncology | Admitting: Radiation Oncology

## 2017-12-07 ENCOUNTER — Ambulatory Visit
Admission: RE | Admit: 2017-12-07 | Discharge: 2017-12-07 | Disposition: A | Discharge status: Home / Self Care | Payer: Self-pay | Source: Ambulatory Visit | Attending: Radiation Oncology | Admitting: Radiation Oncology

## 2017-12-08 ENCOUNTER — Ambulatory Visit
Admission: RE | Admit: 2017-12-08 | Discharge: 2017-12-08 | Disposition: A | Discharge status: Home / Self Care | Payer: Self-pay | Source: Ambulatory Visit | Attending: Radiation Oncology | Admitting: Radiation Oncology

## 2017-12-09 ENCOUNTER — Encounter (HOSPITAL_COMMUNITY): Payer: Self-pay

## 2017-12-09 ENCOUNTER — Encounter: Payer: Self-pay | Admitting: Radiation Oncology

## 2017-12-09 ENCOUNTER — Other Ambulatory Visit: Payer: Self-pay

## 2017-12-09 ENCOUNTER — Inpatient Hospital Stay (HOSPITAL_COMMUNITY): Payer: Self-pay | Attending: Internal Medicine

## 2017-12-09 ENCOUNTER — Encounter (HOSPITAL_COMMUNITY): Payer: Self-pay | Admitting: Adult Health

## 2017-12-09 ENCOUNTER — Ambulatory Visit
Admission: RE | Admit: 2017-12-09 | Discharge: 2017-12-09 | Disposition: A | Discharge status: Home / Self Care | Payer: Self-pay | Source: Ambulatory Visit | Attending: Radiation Oncology | Admitting: Radiation Oncology

## 2017-12-09 VITALS — BP 102/57 | HR 72 | Temp 97.4°F | Resp 18 | Ht 65.0 in | Wt 128.0 lb

## 2017-12-09 DIAGNOSIS — C50811 Malignant neoplasm of overlapping sites of right female breast: Secondary | ICD-10-CM | POA: Insufficient documentation

## 2017-12-09 DIAGNOSIS — Z9011 Acquired absence of right breast and nipple: Secondary | ICD-10-CM | POA: Insufficient documentation

## 2017-12-09 DIAGNOSIS — Z5112 Encounter for antineoplastic immunotherapy: Secondary | ICD-10-CM | POA: Insufficient documentation

## 2017-12-09 DIAGNOSIS — Z170 Estrogen receptor positive status [ER+]: Secondary | ICD-10-CM | POA: Insufficient documentation

## 2017-12-09 DIAGNOSIS — Z9221 Personal history of antineoplastic chemotherapy: Secondary | ICD-10-CM | POA: Insufficient documentation

## 2017-12-09 MED ORDER — SODIUM CHLORIDE 0.9 % IV SOLN
Freq: Once | INTRAVENOUS | Status: AC
  Administered 2017-12-09: 11:00:00 via INTRAVENOUS

## 2017-12-09 MED ORDER — DIPHENHYDRAMINE HCL 25 MG PO CAPS
25.0000 mg | ORAL_CAPSULE | Freq: Once | ORAL | Status: AC
  Administered 2017-12-09: 25 mg via ORAL

## 2017-12-09 MED ORDER — ACETAMINOPHEN 325 MG PO TABS
650.0000 mg | ORAL_TABLET | Freq: Once | ORAL | Status: AC
  Administered 2017-12-09: 650 mg via ORAL

## 2017-12-09 MED ORDER — SODIUM CHLORIDE 0.9 % IV SOLN
420.0000 mg | Freq: Once | INTRAVENOUS | Status: AC
  Administered 2017-12-09: 420 mg via INTRAVENOUS
  Filled 2017-12-09: qty 14

## 2017-12-09 MED ORDER — HEPARIN SOD (PORK) LOCK FLUSH 100 UNIT/ML IV SOLN
500.0000 [IU] | Freq: Once | INTRAVENOUS | Status: AC | PRN
  Administered 2017-12-09: 500 [IU]

## 2017-12-09 MED ORDER — ACETAMINOPHEN 325 MG PO TABS
ORAL_TABLET | ORAL | Status: AC
  Filled 2017-12-09: qty 2

## 2017-12-09 MED ORDER — TRAMADOL HCL 50 MG PO TABS: 50.0000 mg | ORAL_TABLET | Freq: Four times a day (QID) | ORAL | 0 refills | Status: DC | PRN

## 2017-12-09 MED ORDER — DIPHENHYDRAMINE HCL 25 MG PO CAPS
ORAL_CAPSULE | ORAL | Status: AC
  Filled 2017-12-09: qty 2

## 2017-12-09 MED ORDER — TRASTUZUMAB CHEMO 150 MG IV SOLR
6.0000 mg/kg | Freq: Once | INTRAVENOUS | Status: AC
  Administered 2017-12-09: 336 mg via INTRAVENOUS
  Filled 2017-12-09: qty 16

## 2017-12-09 NOTE — Progress Notes (Signed)
I met with the patient yesterday during her undertreated assessment with Dr. Randa Ngo.  She is 4 fractions into her course of therapy to the right breast.  In evaluating her skin, she does not had a have any evidence of hyperpigmentation thus far consistent with the timeframe of her recent regimen.  Her mastectomy scar has healed well, and there is no evidence of any chest wall edema, or right upper extremity lymphedema.  She will plan to proceed with her radiotherapy and be seen next week for her undertreatment visit with Dr. Isidore Moos.  In the meantime she will also continue using radial plaques.  She was encouraged to call if she has questions or concerns.     Carola Rhine, PAC

## 2017-12-09 NOTE — Progress Notes (Signed)
Patient seen briefly today prior to her scheduled Herceptin/Perjeta treatment.  She reports persistent leg/ankle pain, which worsened during chemotherapy.  The pain is affecting her ability to sleep. She tells me that she took oxycodone in the past, "but I don't want anything that strong."  She states that she took Tramadol after her breast surgery, and it was helpful for her breast pain as well as her leg pain.    Given that this leg pain is likely a sequelae of her chemotherapy treatment for breast cancer, I am happy to give her a new prescription for Tramadol.  Reinforced the importance of adequate bowel regimen when taking medications like Tramadol regularly. She voiced understanding.    [place] Controlled Substance Reporting System reviewed and refill is appropriate.  E-scribed to her pharmacy ([facility]) using [employer]'s 2-step verification process.     Mike Craze, NP [place] [phone]

## 2017-12-09 NOTE — Progress Notes (Signed)
Tolerated infusions w/o adverse reaction.  Alert, in no distress.  VSS.  Discharged ambulatory in c/o spouse.  

## 2017-12-09 NOTE — Progress Notes (Signed)
Patient seen briefly today prior to her scheduled Herceptin/Perjeta treatment.  She reports persistent leg/ankle pain, which worsened during chemotherapy.  The pain is affecting her ability to sleep. She tells me that she took oxycodone in the past, "but I don't want anything that strong."  She states that she took Tramadol after her breast surgery, and it was helpful for her breast pain as well as her leg pain.    Given that this leg pain is likely a sequelae of her chemotherapy treatment for breast cancer, I am happy to give her a new prescription for Tramadol.  Reinforced the importance of adequate bowel regimen when taking medications like Tramadol regularly. She voiced understanding.    [place] Controlled Substance Reporting System reviewed and refill is appropriate.  E-scribed to her pharmacy ([facility]) using [employer]'s 2-step verification process.     Mike Craze, NP [place] [phone]

## 2017-12-10 ENCOUNTER — Ambulatory Visit
Admission: RE | Admit: 2017-12-10 | Discharge: 2017-12-10 | Disposition: A | Discharge status: Home / Self Care | Payer: Self-pay | Source: Ambulatory Visit | Attending: Radiation Oncology | Admitting: Radiation Oncology

## 2017-12-11 ENCOUNTER — Ambulatory Visit
Admission: RE | Admit: 2017-12-11 | Discharge: 2017-12-11 | Disposition: A | Discharge status: Home / Self Care | Payer: Self-pay | Source: Ambulatory Visit | Attending: Radiation Oncology | Admitting: Radiation Oncology

## 2017-12-14 ENCOUNTER — Ambulatory Visit
Admission: RE | Admit: 2017-12-14 | Discharge: 2017-12-14 | Disposition: A | Discharge status: Home / Self Care | Payer: Self-pay | Source: Ambulatory Visit | Attending: Radiation Oncology | Admitting: Radiation Oncology

## 2017-12-15 ENCOUNTER — Ambulatory Visit
Admission: RE | Admit: 2017-12-15 | Discharge: 2017-12-15 | Disposition: A | Discharge status: Home / Self Care | Payer: Self-pay | Source: Ambulatory Visit | Attending: Radiation Oncology | Admitting: Radiation Oncology

## 2017-12-16 ENCOUNTER — Ambulatory Visit
Admission: RE | Admit: 2017-12-16 | Discharge: 2017-12-16 | Disposition: A | Discharge status: Home / Self Care | Payer: Self-pay | Source: Ambulatory Visit | Attending: Radiation Oncology | Admitting: Radiation Oncology

## 2017-12-17 ENCOUNTER — Ambulatory Visit
Admission: RE | Admit: 2017-12-17 | Discharge: 2017-12-17 | Disposition: A | Discharge status: Home / Self Care | Payer: Self-pay | Source: Ambulatory Visit | Attending: Radiation Oncology | Admitting: Radiation Oncology

## 2017-12-18 ENCOUNTER — Ambulatory Visit
Admission: RE | Admit: 2017-12-18 | Discharge: 2017-12-18 | Disposition: A | Discharge status: Home / Self Care | Payer: Self-pay | Source: Ambulatory Visit | Attending: Radiation Oncology | Admitting: Radiation Oncology

## 2017-12-21 ENCOUNTER — Ambulatory Visit
Admission: RE | Admit: 2017-12-21 | Discharge: 2017-12-21 | Disposition: A | Discharge status: Home / Self Care | Payer: Self-pay | Source: Ambulatory Visit | Attending: Radiation Oncology | Admitting: Radiation Oncology

## 2017-12-21 DIAGNOSIS — C50511 Malignant neoplasm of lower-outer quadrant of right female breast: Secondary | ICD-10-CM

## 2017-12-21 DIAGNOSIS — Z170 Estrogen receptor positive status [ER+]: Principal | ICD-10-CM

## 2017-12-21 MED ORDER — RADIAPLEXRX EX GEL
Freq: Once | CUTANEOUS | Status: AC
  Administered 2017-12-21: 12:00:00 via TOPICAL

## 2017-12-22 ENCOUNTER — Ambulatory Visit
Admission: RE | Admit: 2017-12-22 | Discharge: 2017-12-22 | Disposition: A | Discharge status: Home / Self Care | Payer: Self-pay | Source: Ambulatory Visit | Attending: Radiation Oncology | Admitting: Radiation Oncology

## 2017-12-23 ENCOUNTER — Ambulatory Visit
Admission: RE | Admit: 2017-12-23 | Discharge: 2017-12-23 | Disposition: A | Discharge status: Home / Self Care | Payer: Self-pay | Source: Ambulatory Visit | Attending: Radiation Oncology | Admitting: Radiation Oncology

## 2017-12-23 DIAGNOSIS — C50811 Malignant neoplasm of overlapping sites of right female breast: Secondary | ICD-10-CM | POA: Insufficient documentation

## 2017-12-23 DIAGNOSIS — Z170 Estrogen receptor positive status [ER+]: Secondary | ICD-10-CM | POA: Insufficient documentation

## 2017-12-23 DIAGNOSIS — Z510 Encounter for antineoplastic radiation therapy: Secondary | ICD-10-CM | POA: Insufficient documentation

## 2017-12-24 ENCOUNTER — Ambulatory Visit
Admission: RE | Admit: 2017-12-24 | Discharge: 2017-12-24 | Disposition: A | Discharge status: Home / Self Care | Payer: Self-pay | Source: Ambulatory Visit | Attending: Radiation Oncology | Admitting: Radiation Oncology

## 2017-12-25 ENCOUNTER — Ambulatory Visit
Admission: RE | Admit: 2017-12-25 | Discharge: 2017-12-25 | Disposition: A | Discharge status: Home / Self Care | Payer: Self-pay | Source: Ambulatory Visit | Attending: Radiation Oncology | Admitting: Radiation Oncology

## 2017-12-28 ENCOUNTER — Ambulatory Visit
Admission: RE | Admit: 2017-12-28 | Discharge: 2017-12-28 | Disposition: A | Discharge status: Home / Self Care | Payer: Self-pay | Source: Ambulatory Visit | Attending: Radiation Oncology | Admitting: Radiation Oncology

## 2017-12-29 ENCOUNTER — Ambulatory Visit
Admission: RE | Admit: 2017-12-29 | Discharge: 2017-12-29 | Disposition: A | Discharge status: Home / Self Care | Payer: Self-pay | Source: Ambulatory Visit | Attending: Radiation Oncology | Admitting: Radiation Oncology

## 2017-12-30 ENCOUNTER — Encounter (HOSPITAL_COMMUNITY): Payer: Self-pay | Admitting: Hematology

## 2017-12-30 ENCOUNTER — Other Ambulatory Visit: Payer: Self-pay

## 2017-12-30 ENCOUNTER — Encounter (HOSPITAL_COMMUNITY): Payer: Self-pay | Admitting: Adult Health

## 2017-12-30 ENCOUNTER — Inpatient Hospital Stay (HOSPITAL_COMMUNITY): Payer: Self-pay

## 2017-12-30 ENCOUNTER — Ambulatory Visit
Admission: RE | Admit: 2017-12-30 | Discharge: 2017-12-30 | Disposition: A | Discharge status: Home / Self Care | Payer: Self-pay | Source: Ambulatory Visit | Attending: Radiation Oncology | Admitting: Radiation Oncology

## 2017-12-30 ENCOUNTER — Inpatient Hospital Stay (HOSPITAL_COMMUNITY): Payer: Self-pay | Attending: Internal Medicine | Admitting: Hematology

## 2017-12-30 VITALS — BP 117/56 | HR 54 | Temp 98.6°F | Resp 18 | Wt 127.0 lb

## 2017-12-30 DIAGNOSIS — C50811 Malignant neoplasm of overlapping sites of right female breast: Secondary | ICD-10-CM | POA: Insufficient documentation

## 2017-12-30 DIAGNOSIS — Z9011 Acquired absence of right breast and nipple: Secondary | ICD-10-CM | POA: Insufficient documentation

## 2017-12-30 DIAGNOSIS — M25572 Pain in left ankle and joints of left foot: Secondary | ICD-10-CM | POA: Insufficient documentation

## 2017-12-30 DIAGNOSIS — Z9221 Personal history of antineoplastic chemotherapy: Secondary | ICD-10-CM | POA: Insufficient documentation

## 2017-12-30 DIAGNOSIS — R5383 Other fatigue: Secondary | ICD-10-CM | POA: Insufficient documentation

## 2017-12-30 DIAGNOSIS — Z170 Estrogen receptor positive status [ER+]: Secondary | ICD-10-CM

## 2017-12-30 DIAGNOSIS — Z79899 Other long term (current) drug therapy: Secondary | ICD-10-CM | POA: Insufficient documentation

## 2017-12-30 DIAGNOSIS — C50911 Malignant neoplasm of unspecified site of right female breast: Secondary | ICD-10-CM

## 2017-12-30 DIAGNOSIS — Z87891 Personal history of nicotine dependence: Secondary | ICD-10-CM | POA: Insufficient documentation

## 2017-12-30 DIAGNOSIS — Z5112 Encounter for antineoplastic immunotherapy: Secondary | ICD-10-CM | POA: Insufficient documentation

## 2017-12-30 LAB — CBC WITH DIFFERENTIAL/PLATELET
BASOS PCT: 0 %
Basophils Absolute: 0 10*3/uL (ref 0.0–0.1)
EOS ABS: 0.1 10*3/uL (ref 0.0–0.7)
EOS PCT: 2 %
HCT: 39.6 % (ref 36.0–46.0)
Hemoglobin: 13.2 g/dL (ref 12.0–15.0)
Lymphocytes Relative: 11 %
Lymphs Abs: 0.6 10*3/uL — ABNORMAL LOW (ref 0.7–4.0)
MCH: 31.6 pg (ref 26.0–34.0)
MCHC: 33.3 g/dL (ref 30.0–36.0)
MCV: 94.7 fL (ref 78.0–100.0)
MONOS PCT: 7 %
Monocytes Absolute: 0.4 10*3/uL (ref 0.1–1.0)
NEUTROS PCT: 80 %
Neutro Abs: 4.4 10*3/uL (ref 1.7–7.7)
PLATELETS: 265 10*3/uL (ref 150–400)
RBC: 4.18 MIL/uL (ref 3.87–5.11)
RDW: 13.3 % (ref 11.5–15.5)
WBC: 5.5 10*3/uL (ref 4.0–10.5)

## 2017-12-30 LAB — COMPREHENSIVE METABOLIC PANEL
ALK PHOS: 107 U/L (ref 38–126)
ALT: 29 U/L (ref 14–54)
AST: 37 U/L (ref 15–41)
Albumin: 3.7 g/dL (ref 3.5–5.0)
Anion gap: 8 (ref 5–15)
BUN: 10 mg/dL (ref 6–20)
CALCIUM: 9.4 mg/dL (ref 8.9–10.3)
CO2: 29 mmol/L (ref 22–32)
CREATININE: 0.63 mg/dL (ref 0.44–1.00)
Chloride: 101 mmol/L (ref 101–111)
GFR calc Af Amer: >60 mL/min (ref >60)
Glucose, Bld: 111 mg/dL — ABNORMAL HIGH (ref 65–99)
Potassium: 3.7 mmol/L (ref 3.5–5.1)
Sodium: 138 mmol/L (ref 135–145)
Total Bilirubin: 0.6 mg/dL (ref 0.3–1.2)
Total Protein: 7.5 g/dL (ref 6.5–8.1)

## 2017-12-30 MED ORDER — DIPHENHYDRAMINE HCL 25 MG PO CAPS
25.0000 mg | ORAL_CAPSULE | Freq: Once | ORAL | Status: AC
  Administered 2017-12-30: 25 mg via ORAL

## 2017-12-30 MED ORDER — ACETAMINOPHEN 325 MG PO TABS
ORAL_TABLET | ORAL | Status: AC
  Filled 2017-12-30: qty 2

## 2017-12-30 MED ORDER — TRASTUZUMAB CHEMO 150 MG IV SOLR
6.0000 mg/kg | Freq: Once | INTRAVENOUS | Status: AC
  Administered 2017-12-30: 336 mg via INTRAVENOUS
  Filled 2017-12-30: qty 16

## 2017-12-30 MED ORDER — DIPHENHYDRAMINE HCL 25 MG PO CAPS
ORAL_CAPSULE | ORAL | Status: AC
  Filled 2017-12-30: qty 2

## 2017-12-30 MED ORDER — HEPARIN SOD (PORK) LOCK FLUSH 100 UNIT/ML IV SOLN
500.0000 [IU] | Freq: Once | INTRAVENOUS | Status: AC | PRN
  Administered 2017-12-30: 500 [IU]

## 2017-12-30 MED ORDER — ACETAMINOPHEN 325 MG PO TABS
650.0000 mg | ORAL_TABLET | Freq: Once | ORAL | Status: AC
  Administered 2017-12-30: 650 mg via ORAL

## 2017-12-30 MED ORDER — SODIUM CHLORIDE 0.9 % IV SOLN
Freq: Once | INTRAVENOUS | Status: AC
  Administered 2017-12-30: 12:00:00 via INTRAVENOUS

## 2017-12-30 MED ORDER — TRAMADOL HCL 50 MG PO TABS: 50.0000 mg | ORAL_TABLET | Freq: Four times a day (QID) | ORAL | 0 refills | Status: DC | PRN

## 2017-12-30 MED ORDER — SODIUM CHLORIDE 0.9 % IV SOLN
420.0000 mg | Freq: Once | INTRAVENOUS | Status: AC
  Administered 2017-12-30: 420 mg via INTRAVENOUS
  Filled 2017-12-30: qty 14

## 2017-12-30 NOTE — Progress Notes (Signed)
Patient here today for scheduled visit and treatment. She requests refill of Tramadol.   [place] Controlled Substance Reporting System reviewed and refill is appropriate on or after 12/30/17. Medication e-scribed to her pharmacy ([place]) using [employer]'s 2-step verification process.    [place] reviewed:     Mike Craze, NP [place] [phone]

## 2017-12-30 NOTE — Progress Notes (Signed)
Tolerated infusions w/o adverse reaction.  Alert, in no distress.  VSS.  Discharged ambulatory in c/o spouse.  

## 2017-12-30 NOTE — Progress Notes (Signed)
[place], [place] [address]   CLINIC:  Medical Oncology/Hematology  PCP:  Patient, No Pcp Per No address on file None   REASON FOR VISIT:  Follow-up for right breast cancer.  CURRENT THERAPY: Maintenance Herceptin/Perjeta every 3 weeks.  BRIEF ONCOLOGIC HISTORY:    Malignant neoplasm of overlapping sites of right breast in female, estrogen receptor positive ([place])   05/12/2017 Initial Biopsy    (R) breast (LIQ): IDC, grade 3; ER+ (80%), PR+ (10%), HER2+ (ratio 2.56). Ki67 80%.        05/12/2017 Procedure    Additional (R) breast biopsy (UOQ): IDC, grade 3; ER+ (80%), PR+ (50%), HER2+ (ratio 2.77), Ki67 90%.       05/12/2017 Procedure    (R) axillary lymph node biopsy: Metastatic carcinoma; ER+ (80%), PR+ (30%), HER2+ (ratio 2.99), Ki67 80%.       05/12/2017 Imaging    Mammogram/breast US: Targeted ultrasound is performed, showing multiple irregular hypoechoic masses. In the 6 o'clock region of the right breast is a large irregular mass spans at least 4.8 cm. In the 4:00 to 5:00 region of the right breast an irregular mass measures 4.5 cm. In the 930-10 o'clock position of the right breast centered 9-10 cm from the nipple is an irregular 4.0 x 3.8 x 1.9 cm hypoechoic mass. In the 9:30 position of the right breast at the lateral margin is a probable abnormal intramammary lymph node measuring 1.3 cm greatest diameter. In the 10 o'clock position of the right breast far lateral is a hypoechoic dermal nodule that measures 0.5 cm. Diffuse skin thickening of the right breast measures up to 4.5 mm.      05/14/2017 Tumor Marker      Ref. Range 05/14/2017 15:30  CA 15-3 Latest Ref Range: 0.0 - 25.0 U/mL 115.9 (H)     Ref. Range 05/14/2017 15:30  CA 27.29 Latest Ref Range: 0.0 - 38.6 U/mL 157.7 (H)        05/15/2017 Imaging    CT chest/abd/pelvis:  IMPRESSION: 1. Numerous right breast masses and metastatic right axillary lymphadenopathy.  Findings suspicious for chest wall invasion along the lower lateral aspect of the breast. 2. Two small indeterminate right lower lobe pulmonary nodules will require surveillance. 3. No findings for metastatic disease involving the abdomen/pelvis or osseous structures.      05/15/2017 Imaging    Bone scan:  IMPRESSION: No evidence of metastatic disease.      05/18/2017 Echocardiogram    LV EF: 60% -   65%      05/20/2017 Procedure    Port-a-cath placement       05/25/2017 Initial Diagnosis    Malignant neoplasm of overlapping sites of right breast in female, estrogen receptor positive ([place])      05/27/2017 - 09/10/2017 Neo-Adjuvant Chemotherapy    TCHPx 6 cycles      10/14/2017 Surgery    Right modified radical mastectomy and lymph node biopsy on 10/14/2017, with complete pathological response, [id number]        CANCER STAGING: Cancer Staging Malignant neoplasm of overlapping sites of right breast in female, estrogen receptor positive ([place]) Staging form: Breast, AJCC 8th Edition - Clinical stage from 05/25/2017: Stage IIIB (cT4b, cN3a, cM0, G3, ER+, PR+, HER2+) - Signed by Holley Bouche, NP on 07/07/2017  Malignant neoplasm of overlapping sites of right female breast [facility]) Staging form: Breast, AJCC 8th Edition - Clinical: No Stage Recommended (ycT4b, cN3a, cM0, G3, ER: Positive, PR:  Positive, HER2: Positive) - Signed by Twana First, MD on 05/25/2017    INTERVAL HISTORY:  Angel Hale 47 y.o. female returns for routine follow-up and consideration for next cycle of Herceptin and Pertuzumab.   Currently undergoing breast radiation therapy; under the care with Dr. Isidore Moos in [place].  She is scheduled to finish XRT on 01/13/18.  She has had some skin itching, which is being managed.    Here today with her husband.   She is continuing to struggle with left ankle pain, which has been longstanding since her ankle was fractured last year.  "I think the chemo inflamed it  more and it has been hurting more since I have been more active."    Last ECHO due on 11/26/17 showed normal EF 60-65%.  Denies shortness of breath or orthopnea. Denies LE edema, aside from her old (L) ankle injury.  She has some fatigue, which she attributes to her currently undergoing radiation therapy.   Reports hot flashes about 3-5 per day and night sweats. Her menses has not resumed as of yet.   She is also continuing to struggle with the decision of starting aromatase inhibitor (like Aromasin) with monthly Zoladex vs oopherectomy with AI x 10 years.  She has more questions regarding this options today. She wants to know if she can take Tamoxifen alone.  These options were discussed with her re: risk reduction and risk of future recurrence.    She is leaning more towards oopherectomy surgery.   She also reports that she would like to have her port-a-cath removal as soon as she is able to after she completes the Herceptin/Perjeta later this year.     REVIEW OF SYSTEMS:  Review of Systems  Constitutional: Positive for fatigue.  Musculoskeletal:       Right ankle pain at the site of previous fracture.  All other systems reviewed and are negative.    PAST MEDICAL/SURGICAL HISTORY:  Past Medical History:  Diagnosis Date  . Cancer [facility])    breast cancer   Past Surgical History:  Procedure Laterality Date  . BUNIONECTOMY Left 2000  . EXTERNAL FIXATION LEG Left 02/16/2017   Procedure: EXTERNAL FIXATION LEG;  Surgeon: Mcarthur Rossetti, MD;  Location: [place];  Service: Orthopedics;  Laterality: Left;  . HARDWARE REMOVAL Left 05/21/2017   Procedure: REMOVAL OF RETAINED PROMINENT SCREW LEFT ANKLE;  Surgeon: Mcarthur Rossetti, MD;  Location: [place];  Service: Orthopedics;  Laterality: Left;  Marland Kitchen MASTECTOMY MODIFIED RADICAL Right 10/14/2017   Procedure: MASTECTOMY MODIFIED RADICAL;  Surgeon: Virl Cagey, MD;  Location: [facility];  Service: General;  Laterality: Right;  . ORIF  ANKLE FRACTURE Left 03/03/2017  . ORIF ANKLE FRACTURE Left 03/03/2017   Procedure: OPEN REDUCTION INTERNAL FIXATION (ORIF) LEFT PILON FRACTURE AND LEFT LATERAL MALLEOLUS;  Surgeon: Mcarthur Rossetti, MD;  Location: [place];  Service: Orthopedics;  Laterality: Left;  . OVARIAN CYST REMOVAL Left 1991  . PORTACATH PLACEMENT    . PORTACATH PLACEMENT Left 05/20/2017   Procedure: INSERTION PORT-A-CATH;  Surgeon: Virl Cagey, MD;  Location: [facility];  Service: General;  Laterality: Left;  . WISDOM TOOTH EXTRACTION       SOCIAL HISTORY:  Social History   Socioeconomic History  . Marital status: Married    Spouse name: Not on file  . Number of children: Not on file  . Years of education: Not on file  . Highest education level: Not on file  Occupational History  . Not  on file  Social Needs  . Financial resource strain: Not on file  . Food insecurity:    Worry: Not on file    Inability: Not on file  . Transportation needs:    Medical: Not on file    Non-medical: Not on file  Tobacco Use  . Smoking status: Former Smoker    Packs/day: 1.00    Years: 10.00    Pack years: 10.00    Types: Cigarettes    Last attempt to quit: 02/16/2017    Years since quitting: 0.8  . Smokeless tobacco: Never Used  Substance and Sexual Activity  . Alcohol use: Yes    Comment: occasional  . Drug use: No  . Sexual activity: Not Currently    Birth control/protection: None  Lifestyle  . Physical activity:    Days per week: Not on file    Minutes per session: Not on file  . Stress: Not on file  Relationships  . Social connections:    Talks on phone: Not on file    Gets together: Not on file    Attends religious service: Not on file    Active member of club or organization: Not on file    Attends meetings of clubs or organizations: Not on file    Relationship status: Not on file  . Intimate partner violence:    Fear of current or ex partner: Not on file    Emotionally abused: Not on file     Physically abused: Not on file    Forced sexual activity: Not on file  Other Topics Concern  . Not on file  Social History Narrative  . Not on file    FAMILY HISTORY:  Family History  Problem Relation Age of Onset  . Colon cancer Father     CURRENT MEDICATIONS:  Outpatient Encounter Medications as of 12/30/2017  Medication Sig  . Coenzyme Q10 (CO Q-10 PO) Take 1 tablet by mouth daily.  . Cyanocobalamin (B-12 PO) Take 1 tablet by mouth daily.  . Homeopathic Products (FRANKINCENSE UPLIFTING) OIL Take 1 capsule by mouth daily as needed (for home remedy, taking orally, not internally).   Marland Kitchen LAVENDER OIL PO Take 1 capsule by mouth daily as needed (for home remedy).  Marland Kitchen lidocaine-prilocaine (EMLA) cream Apply to affected area once  . LORazepam (ATIVAN) 0.5 MG tablet Take 1 tablet (0.5 mg total) by mouth every 6 (six) hours as needed (Nausea or vomiting).  . methocarbamol (ROBAXIN) 500 MG tablet Take 1 tablet (500 mg total) by mouth every 6 (six) hours as needed for muscle spasms.  . Multiple Vitamins-Minerals (ZINC PO) Take 1 capsule by mouth daily.  . ondansetron (ZOFRAN) 8 MG tablet Take 1 tablet (8 mg total) by mouth 2 (two) times daily as needed for refractory nausea / vomiting. Start on day 3 after chemo.  Marland Kitchen Pertuzumab (PERJETA IV) Inject into the vein. Every 3 weeks  . prochlorperazine (COMPAZINE) 10 MG tablet Take 1 tablet (10 mg total) by mouth every 6 (six) hours as needed (Nausea or vomiting).  . traMADol (ULTRAM) 50 MG tablet Take 1 tablet (50 mg total) by mouth every 6 (six) hours as needed for severe pain.  . Trastuzumab (HERCEPTIN IV) Inject into the vein. Every 3 weeks  . vitamin C (ASCORBIC ACID) 500 MG tablet Take 1 tablet (500 mg total) by mouth daily.  . [DISCONTINUED] traMADol (ULTRAM) 50 MG tablet Take 1 tablet (50 mg total) by mouth every 6 (six) hours as needed for  severe pain.  . [EXPIRED] acetaminophen (TYLENOL) tablet 650 mg   . [EXPIRED] diphenhydrAMINE (BENADRYL)  capsule 25 mg    No facility-administered encounter medications on file as of 12/30/2017.     ALLERGIES:  Allergies  Allergen Reactions  . Penicillins Nausea And Vomiting    Has patient had a PCN reaction causing immediate rash, facial/tongue/throat swelling, SOB or lightheadedness with hypotension: No Has patient had a PCN reaction causing severe rash involving mucus membranes or skin necrosis: No Has patient had a PCN reaction that required hospitalization: No Has patient had a PCN reaction occurring within the last 10 years: No If all of the above answers are "NO", then may proceed with Cephalosporin use.      PHYSICAL EXAM:  ECOG Performance status: 1 Deferred.   LABORATORY DATA:  I have reviewed the labs as listed.  CBC    Component Value Date/Time   WBC 5.5 12/30/2017 1040   RBC 4.18 12/30/2017 1040   HGB 13.2 12/30/2017 1040   HCT 39.6 12/30/2017 1040   PLT 265 12/30/2017 1040   MCV 94.7 12/30/2017 1040   MCH 31.6 12/30/2017 1040   MCHC 33.3 12/30/2017 1040   RDW 13.3 12/30/2017 1040   LYMPHSABS 0.6 (L) 12/30/2017 1040   MONOABS 0.4 12/30/2017 1040   EOSABS 0.1 12/30/2017 1040   BASOSABS 0.0 12/30/2017 1040   CMP Latest Ref Rng & Units 12/30/2017 11/18/2017 10/15/2017  Glucose 65 - 99 mg/dL 111(H) 92 106(H)  BUN 6 - 20 mg/dL '10 12 7  ' Creatinine 0.44 - 1.00 mg/dL 0.63 0.78 0.61  Sodium 135 - 145 mmol/L 138 137 139  Potassium 3.5 - 5.1 mmol/L 3.7 3.9 3.4(L)  Chloride 101 - 111 mmol/L 101 101 103  CO2 22 - 32 mmol/L '29 26 27  ' Calcium 8.9 - 10.3 mg/dL 9.4 9.6 8.7(L)  Total Protein 6.5 - 8.1 g/dL 7.5 7.8 -  Total Bilirubin 0.3 - 1.2 mg/dL 0.6 0.8 -  Alkaline Phos 38 - 126 U/L 107 92 -  AST 15 - 41 U/L 37 22 -  ALT 14 - 54 U/L 29 20 -       DIAGNOSTIC IMAGING:  I have reviewed 2D echocardiogram.    ASSESSMENT & PLAN:   Malignant neoplasm of overlapping sites of right female breast ([place]) 1.  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  Patient is pre-menopausal.  We discussed the option of surgical oophorectomy versus using LHRH agonists.  She would like to have surgical oophorectomy as soon as she completes radiation therapy.  She will continue Herceptin and Pertuzumab for a total of 1 year.  I have reviewed the reports of echocardiogram dated 11/26/2017 which shows ejection fraction of 60 to 65%.  She will proceed with today's dose of double anti-HER-2 therapy.  She wants to have her Port-A-Cath removed after finishing Herceptin and Pertuzumab.  2.  High risk drug monitoring for Herceptin: She does not have any clinical signs or symptoms of CHF.  We reviewed the reports of last 2D echocardiogram on 11/26/2017 which shows ejection fraction of 60 to 65%.  3.  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  She does have on and off numbness in the fingertips.  Even though she was prescribed gabapentin 100 mg 3  times daily, she never took it because of concern for side effects.  We have spent a total of 40 minutes with more than 80% of the time spent face-to-face discussing treatment options including surgical oophorectomy versus medical oophorectomy.    This note includes documentation from Mike Craze, NP, who was present during this patient's office visit and evaluation.  I have reviewed this note for its completeness and accuracy.  I have edited this note accordingly based on my findings and medical opinion.       Derek Jack, MD [place] [phone]

## 2017-12-31 ENCOUNTER — Encounter (HOSPITAL_COMMUNITY): Payer: Self-pay | Admitting: Hematology

## 2017-12-31 ENCOUNTER — Ambulatory Visit
Admission: RE | Admit: 2017-12-31 | Discharge: 2017-12-31 | Disposition: A | Discharge status: Home / Self Care | Payer: Self-pay | Source: Ambulatory Visit | Attending: Radiation Oncology | Admitting: Radiation Oncology

## 2017-12-31 NOTE — Assessment & Plan Note (Signed)
1.  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  Patient is pre-menopausal.  We discussed the option of surgical oophorectomy versus using LHRH agonists.  She would like to have surgical oophorectomy as soon as she completes radiation therapy.  She will continue Herceptin and Pertuzumab for a total of 1 year.  I have reviewed the reports of echocardiogram dated 11/26/2017 which shows ejection fraction of 60 to 65%.  She will proceed with today's dose of double anti-HER-2 therapy.  She wants to have her Port-A-Cath removed after finishing Herceptin and Pertuzumab.  2.  High risk drug monitoring for Herceptin: She does not have any clinical signs or symptoms of CHF.  We reviewed the reports of last 2D echocardiogram on 11/26/2017 which shows ejection fraction of 60 to 65%.  3.  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  She does have on and off numbness in the fingertips.  Even though she was prescribed gabapentin 100 mg 3 times daily, she never took it because of concern for side effects.

## 2018-01-01 ENCOUNTER — Ambulatory Visit
Admission: RE | Admit: 2018-01-01 | Discharge: 2018-01-01 | Disposition: A | Discharge status: Home / Self Care | Payer: Self-pay | Source: Ambulatory Visit | Attending: Radiation Oncology | Admitting: Radiation Oncology

## 2018-01-04 ENCOUNTER — Ambulatory Visit
Admission: RE | Admit: 2018-01-04 | Discharge: 2018-01-04 | Disposition: A | Discharge status: Home / Self Care | Payer: Self-pay | Source: Ambulatory Visit | Attending: Radiation Oncology | Admitting: Radiation Oncology

## 2018-01-05 ENCOUNTER — Ambulatory Visit
Admission: RE | Admit: 2018-01-05 | Discharge: 2018-01-05 | Disposition: A | Discharge status: Home / Self Care | Payer: Self-pay | Source: Ambulatory Visit | Attending: Radiation Oncology | Admitting: Radiation Oncology

## 2018-01-06 ENCOUNTER — Ambulatory Visit
Admission: RE | Admit: 2018-01-06 | Discharge: 2018-01-06 | Disposition: A | Discharge status: Home / Self Care | Payer: Self-pay | Source: Ambulatory Visit | Attending: Radiation Oncology | Admitting: Radiation Oncology

## 2018-01-07 ENCOUNTER — Ambulatory Visit
Admission: RE | Admit: 2018-01-07 | Discharge: 2018-01-07 | Disposition: A | Discharge status: Home / Self Care | Payer: Self-pay | Source: Ambulatory Visit | Attending: Radiation Oncology | Admitting: Radiation Oncology

## 2018-01-08 ENCOUNTER — Ambulatory Visit
Admission: RE | Admit: 2018-01-08 | Discharge: 2018-01-08 | Disposition: A | Discharge status: Home / Self Care | Payer: Self-pay | Source: Ambulatory Visit | Attending: Radiation Oncology | Admitting: Radiation Oncology

## 2018-01-11 ENCOUNTER — Ambulatory Visit
Admission: RE | Admit: 2018-01-11 | Discharge: 2018-01-11 | Disposition: A | Discharge status: Home / Self Care | Payer: Self-pay | Source: Ambulatory Visit | Attending: Radiation Oncology | Admitting: Radiation Oncology

## 2018-01-11 DIAGNOSIS — C50511 Malignant neoplasm of lower-outer quadrant of right female breast: Secondary | ICD-10-CM

## 2018-01-11 DIAGNOSIS — Z170 Estrogen receptor positive status [ER+]: Principal | ICD-10-CM

## 2018-01-11 MED ORDER — RADIAPLEXRX EX GEL
Freq: Once | CUTANEOUS | Status: AC
  Administered 2018-01-11: 11:00:00 via TOPICAL

## 2018-01-12 ENCOUNTER — Ambulatory Visit
Admission: RE | Admit: 2018-01-12 | Discharge: 2018-01-12 | Disposition: A | Discharge status: Home / Self Care | Payer: Self-pay | Source: Ambulatory Visit | Attending: Radiation Oncology | Admitting: Radiation Oncology

## 2018-01-13 ENCOUNTER — Ambulatory Visit
Admission: RE | Admit: 2018-01-13 | Discharge: 2018-01-13 | Disposition: A | Discharge status: Home / Self Care | Payer: Self-pay | Source: Ambulatory Visit | Attending: Radiation Oncology | Admitting: Radiation Oncology

## 2018-01-17 ENCOUNTER — Emergency Department (HOSPITAL_COMMUNITY)
Admission: EM | Admit: 2018-01-17 | Discharge: 2018-01-17 | Disposition: A | Discharge status: Home / Self Care | Payer: Self-pay | Attending: Emergency Medicine | Admitting: Emergency Medicine

## 2018-01-17 ENCOUNTER — Encounter (HOSPITAL_COMMUNITY): Payer: Self-pay | Admitting: Emergency Medicine

## 2018-01-17 DIAGNOSIS — Z87891 Personal history of nicotine dependence: Secondary | ICD-10-CM | POA: Insufficient documentation

## 2018-01-17 DIAGNOSIS — N3000 Acute cystitis without hematuria: Secondary | ICD-10-CM | POA: Insufficient documentation

## 2018-01-17 DIAGNOSIS — R531 Weakness: Secondary | ICD-10-CM | POA: Insufficient documentation

## 2018-01-17 DIAGNOSIS — R110 Nausea: Secondary | ICD-10-CM | POA: Insufficient documentation

## 2018-01-17 DIAGNOSIS — Z853 Personal history of malignant neoplasm of breast: Secondary | ICD-10-CM | POA: Insufficient documentation

## 2018-01-17 DIAGNOSIS — Z79899 Other long term (current) drug therapy: Secondary | ICD-10-CM | POA: Insufficient documentation

## 2018-01-17 LAB — URINALYSIS, ROUTINE W REFLEX MICROSCOPIC
Bilirubin Urine: NEGATIVE
Glucose, UA: NEGATIVE mg/dL
Ketones, ur: NEGATIVE mg/dL
Nitrite: NEGATIVE
PROTEIN: 30 mg/dL — AB
SPECIFIC GRAVITY, URINE: 1.002 — AB (ref 1.005–1.030)
TRANS EPITHEL UA: 1
WBC, UA: >50 WBC/hpf — ABNORMAL HIGH (ref 0–5)
pH: 6 (ref 5.0–8.0)

## 2018-01-17 LAB — PREGNANCY, URINE: PREG TEST UR: NEGATIVE

## 2018-01-17 MED ORDER — CEPHALEXIN 500 MG PO CAPS: 500.0000 mg | ORAL_CAPSULE | Freq: Two times a day (BID) | ORAL | 0 refills | Status: AC

## 2018-01-17 MED ORDER — CEPHALEXIN 500 MG PO CAPS
500.0000 mg | ORAL_CAPSULE | Freq: Once | ORAL | Status: AC
  Administered 2018-01-17: 500 mg via ORAL
  Filled 2018-01-17: qty 1

## 2018-01-17 MED ORDER — ONDANSETRON 4 MG PO TBDP: 4.0000 mg | ORAL_TABLET | Freq: Three times a day (TID) | ORAL | 0 refills | Status: DC | PRN

## 2018-01-17 NOTE — Discharge Instructions (Signed)
Your urine sample shows that you have a urinary tract infection.  Please take cephalexin twice a day for the next 7 days, Zofran as needed for nausea Seek medical attention for severe or worsening pain vomiting or fevers.  If you are not better within 48 hours she will need to have your family doctor recheck you. A urine culture was performed, have your family doctor follow-up on this culture within 48 hours, it will tell you if the antibiotic needs to be changed

## 2018-01-17 NOTE — ED Triage Notes (Signed)
Pt reports dysuria since yesterday with frequency and urgency.  Denies hematuria.

## 2018-01-17 NOTE — ED Provider Notes (Signed)
[facility] EMERGENCY DEPARTMENT Provider Note   CSN: [id number] Arrival date & time: 01/17/18  2831     History   Chief Complaint Chief Complaint  Patient presents with  . Dysuria    HPI Angel Hale is a 47 y.o. female.  HPI  A 64-year-old female presents with a complaint of some suprapubic discomfort as well as urinary frequency and discomfort.  This started yesterday, she had some leftover Bactrim from a prior skin infection, took a single dose, also took a dose of tramadol, she does not usually use these medications and yesterday afternoon felt generally weak.  Those symptoms have gone away and now she is left with this frequent discomfort urination.  No fevers but has had mild nausea.  She did have urinary infections in the past but no longer gets them over the last many years.  She has recently undergone treatment for breast cancer and had radiation therapy this week.  Past Medical History:  Diagnosis Date  . Cancer [facility])    breast cancer    Patient Active Problem List   Diagnosis Date Noted  . 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 Breast cancer ([place]) 10/14/2017  . Malignant neoplasm of overlapping sites of right breast in female, estrogen receptor positive ([place]) 05/25/2017  . Painful orthopaedic hardware ([place]) left ankle 05/21/2017  . Malignant neoplasm of overlapping sites of right female breast ([place]) 05/14/2017  . Breast mass, right 05/12/2017  . Closed left pilon fracture, initial encounter 02/16/2017    Past Surgical History:  Procedure Laterality Date  . BUNIONECTOMY Left 2000  . EXTERNAL FIXATION LEG Left 02/16/2017   Procedure: EXTERNAL FIXATION LEG;  Surgeon: Mcarthur Rossetti, MD;  Location: [place];  Service: Orthopedics;  Laterality: Left;  . HARDWARE REMOVAL Left 05/21/2017   Procedure: REMOVAL OF RETAINED PROMINENT SCREW LEFT ANKLE;  Surgeon: Mcarthur Rossetti, MD;   Location: [place];  Service: Orthopedics;  Laterality: Left;  Marland Kitchen MASTECTOMY MODIFIED RADICAL Right 10/14/2017   Procedure: MASTECTOMY MODIFIED RADICAL;  Surgeon: Virl Cagey, MD;  Location: [facility];  Service: General;  Laterality: Right;  . ORIF ANKLE FRACTURE Left 03/03/2017  . ORIF ANKLE FRACTURE Left 03/03/2017   Procedure: OPEN REDUCTION INTERNAL FIXATION (ORIF) LEFT PILON FRACTURE AND LEFT LATERAL MALLEOLUS;  Surgeon: Mcarthur Rossetti, MD;  Location: [place];  Service: Orthopedics;  Laterality: Left;  . OVARIAN CYST REMOVAL Left 1991  . PORTACATH PLACEMENT    . PORTACATH PLACEMENT Left 05/20/2017   Procedure: INSERTION PORT-A-CATH;  Surgeon: Virl Cagey, MD;  Location: [facility];  Service: General;  Laterality: Left;  . WISDOM TOOTH EXTRACTION       OB History   None      Home Medications    Prior to Admission medications   Medication Sig Start Date End Date Taking? Authorizing Provider  cephALEXin (KEFLEX) 500 MG capsule Take 1 capsule (500 mg total) by mouth 2 (two) times daily for 7 days. 01/17/18 01/24/18  Noemi Chapel, MD  Coenzyme Q10 (CO Q-10 PO) Take 1 tablet by mouth daily.    [provider]  Cyanocobalamin (B-12 PO) Take 1 tablet by mouth daily.    [provider]  Homeopathic Products (FRANKINCENSE UPLIFTING) OIL Take 1 capsule by mouth daily as needed (for home remedy, taking orally, not internally).     [provider]  LAVENDER OIL PO Take 1 capsule by mouth daily as needed (  for home remedy).    [provider]  lidocaine-prilocaine (EMLA) cream Apply to affected area once 05/25/17   Twana First, MD  LORazepam (ATIVAN) 0.5 MG tablet Take 1 tablet (0.5 mg total) by mouth every 6 (six) hours as needed (Nausea or vomiting). 05/25/17   Twana First, MD  methocarbamol (ROBAXIN) 500 MG tablet Take 1 tablet (500 mg total) by mouth every 6 (six) hours as needed for muscle spasms. 03/05/17   Pete Pelt, PA-C  Multiple  Vitamins-Minerals (ZINC PO) Take 1 capsule by mouth daily.    [provider]  ondansetron (ZOFRAN ODT) 4 MG disintegrating tablet Take 1 tablet (4 mg total) by mouth every 8 (eight) hours as needed for nausea. 01/17/18   Noemi Chapel, MD  ondansetron (ZOFRAN) 8 MG tablet Take 1 tablet (8 mg total) by mouth 2 (two) times daily as needed for refractory nausea / vomiting. Start on day 3 after chemo. 05/25/17   Twana First, MD  Pertuzumab (PERJETA IV) Inject into the vein. Every 3 weeks    [provider]  prochlorperazine (COMPAZINE) 10 MG tablet Take 1 tablet (10 mg total) by mouth every 6 (six) hours as needed (Nausea or vomiting). 05/25/17   Twana First, MD  traMADol (ULTRAM) 50 MG tablet Take 1 tablet (50 mg total) by mouth every 6 (six) hours as needed for severe pain. 12/30/17   Holley Bouche, NP  Trastuzumab (HERCEPTIN IV) Inject into the vein. Every 3 weeks    [provider]  vitamin C (ASCORBIC ACID) 500 MG tablet Take 1 tablet (500 mg total) by mouth daily. 03/05/17   Pete Pelt, PA-C    Family History Family History  Problem Relation Age of Onset  . Colon cancer Father     Social History Social History   Tobacco Use  . Smoking status: Former Smoker    Packs/day: 1.00    Years: 10.00    Pack years: 10.00    Types: Cigarettes    Last attempt to quit: 02/16/2017    Years since quitting: 0.9  . Smokeless tobacco: Never Used  Substance Use Topics  . Alcohol use: Yes    Comment: occasional  . Drug use: No     Allergies   Penicillins   Review of Systems Review of Systems  Constitutional: Negative for fever.  Respiratory: Negative for cough and shortness of breath.   Cardiovascular: Negative for chest pain.  Gastrointestinal: Positive for abdominal pain and nausea.  Genitourinary: Positive for difficulty urinating, dysuria and frequency. Negative for hematuria.  Musculoskeletal: Negative for back pain.  Neurological: Positive for  weakness ( generalized yesterday).     Physical Exam Updated Vital Signs Ht 5\' 5"  (1.651 m)   Wt 58.1 kg (128 lb)   BMI 21.30 kg/m   Physical Exam  Constitutional: She appears well-developed and well-nourished. No distress.  HENT:  Head: Normocephalic and atraumatic.  Eyes: Conjunctivae are normal. Right eye exhibits no discharge. Left eye exhibits no discharge. No scleral icterus.  Neck: Normal range of motion. Neck supple. No JVD present. No thyromegaly present.  Cardiovascular: Normal rate, regular rhythm, normal heart sounds and intact distal pulses. Exam reveals no gallop and no friction rub.  No murmur heard. Pulmonary/Chest: Effort normal and breath sounds normal. No respiratory distress. She has no wheezes. She has no rales.  Abdominal: Soft. Bowel sounds are normal. She exhibits no distension and no mass. There is tenderness ( Mild suprapubic and right lower quadrant tenderness).  No CVA tenderness  Musculoskeletal: Normal range of motion. She exhibits no edema or tenderness.  Lymphadenopathy:    She has no cervical adenopathy.  Neurological: She is alert. Coordination normal.  Skin: Skin is warm and dry. There is erythema ( Erythema over the right chest where she has been getting her radiation).  Psychiatric: She has a normal mood and affect. Her behavior is normal.  Nursing note and vitals reviewed.    ED Treatments / Results  Labs (all labs ordered are listed, but only abnormal results are displayed) Labs Reviewed  URINALYSIS, ROUTINE W REFLEX MICROSCOPIC - Abnormal; Notable for the following components:      Result Value   APPearance CLOUDY (*)    Specific Gravity, Urine 1.002 (*)    Hgb urine dipstick LARGE (*)    Protein, ur 30 (*)    Leukocytes, UA LARGE (*)    WBC, UA >50 (*)    Bacteria, UA MANY (*)    All other components within normal limits  URINE CULTURE  PREGNANCY, URINE    EKG None  Radiology No results found.  Procedures Procedures  (including critical care time)  Medications Ordered in ED Medications  cephALEXin (KEFLEX) capsule 500 mg (has no administration in time range)     Initial Impression / Assessment and Plan / ED Course  I have reviewed the triage vital signs and the nursing notes.  Pertinent labs & imaging results that were available during my care of the patient were reviewed by me and considered in my medical decision making (see chart for details).  Clinical Course as of Jan 17 929  Sun Jan 17, 2018  0927 Urinary testing shows that she is leukocyte positive with greater than 50 white blood cells, many bacteria, there is also negative nitrites, negative ketones but positive protein.  Patient informed of all these results, will be started on a different antibiotic, will try Keflex given that she took Bactrim and still has an abnormal urine infection.   [BM]    Clinical Course User Index [BM] Noemi Chapel, MD   The patient does not appear acutely ill however she does have symptoms that could be consistent with a urinary tract infection.  Urinalysis obtained, rule out pregnancy, the patient is not septic appearing and has no signs of pyelonephritis.  Final Clinical Impressions(s) / ED Diagnoses   Final diagnoses:  Acute cystitis without hematuria    ED Discharge Orders        Ordered    cephALEXin (KEFLEX) 500 MG capsule  2 times daily     01/17/18 0929    ondansetron (ZOFRAN ODT) 4 MG disintegrating tablet  Every 8 hours PRN     01/17/18 0929       Noemi Chapel, MD 01/17/18 0930

## 2018-01-19 ENCOUNTER — Encounter: Payer: Self-pay | Admitting: Radiation Oncology

## 2018-01-19 LAB — URINE CULTURE

## 2018-01-19 NOTE — Progress Notes (Signed)
  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   She did endorse burning, stinging, and pain to her right chest and axilla.  Plan: The patient has completed radiation treatment. The patient will return to radiation oncology clinic for routine followup in one month. I advised them to call or return sooner if they have any questions or concerns related to their recovery or treatment.  -----------------------------------  Eppie Gibson, MD  This document serves as a record of services personally performed by Eppie Gibson, MD. It was created on his behalf by Linward Natal, a trained medical scribe. The creation of this record is based on the scribe's personal observations and the provider's statements to them. This document has been checked and approved by the attending provider.

## 2018-01-20 ENCOUNTER — Encounter (HOSPITAL_COMMUNITY): Payer: Self-pay

## 2018-01-20 ENCOUNTER — Inpatient Hospital Stay (HOSPITAL_COMMUNITY): Payer: Self-pay

## 2018-01-20 ENCOUNTER — Telehealth: Payer: Self-pay | Admitting: Emergency Medicine

## 2018-01-20 VITALS — BP 97/66 | HR 70 | Temp 98.8°F | Resp 18 | Wt 126.4 lb

## 2018-01-20 DIAGNOSIS — C50811 Malignant neoplasm of overlapping sites of right female breast: Secondary | ICD-10-CM

## 2018-01-20 DIAGNOSIS — Z170 Estrogen receptor positive status [ER+]: Principal | ICD-10-CM

## 2018-01-20 MED ORDER — SODIUM CHLORIDE 0.9 % IV SOLN
6.0000 mg/kg | Freq: Once | INTRAVENOUS | Status: AC
  Administered 2018-01-20: 336 mg via INTRAVENOUS
  Filled 2018-01-20: qty 16

## 2018-01-20 MED ORDER — HEPARIN SOD (PORK) LOCK FLUSH 100 UNIT/ML IV SOLN
500.0000 [IU] | Freq: Once | INTRAVENOUS | Status: AC | PRN
  Administered 2018-01-20: 500 [IU]
  Filled 2018-01-20: qty 5

## 2018-01-20 MED ORDER — SODIUM CHLORIDE 0.9 % IV SOLN
420.0000 mg | Freq: Once | INTRAVENOUS | Status: AC
  Administered 2018-01-20: 420 mg via INTRAVENOUS
  Filled 2018-01-20: qty 14

## 2018-01-20 MED ORDER — ACETAMINOPHEN 325 MG PO TABS
650.0000 mg | ORAL_TABLET | Freq: Once | ORAL | Status: AC
  Administered 2018-01-20: 650 mg via ORAL
  Filled 2018-01-20: qty 2

## 2018-01-20 MED ORDER — DIPHENHYDRAMINE HCL 25 MG PO CAPS
25.0000 mg | ORAL_CAPSULE | Freq: Once | ORAL | Status: AC
  Administered 2018-01-20: 25 mg via ORAL
  Filled 2018-01-20: qty 1

## 2018-01-20 MED ORDER — SODIUM CHLORIDE 0.9% FLUSH
10.0000 mL | INTRAVENOUS | Status: DC | PRN
  Administered 2018-01-20: 10 mL
  Filled 2018-01-20: qty 10

## 2018-01-20 MED ORDER — SODIUM CHLORIDE 0.9 % IV SOLN
Freq: Once | INTRAVENOUS | Status: AC
  Administered 2018-01-20: 12:00:00 via INTRAVENOUS

## 2018-01-20 NOTE — Progress Notes (Signed)
Angel Hale tolerated Herceptin and Perjeta infusions well without complaints or incident. VSS upon discharge. Pt discharged self ambulatory in satisfactory condition accompanied by her husband

## 2018-01-20 NOTE — Telephone Encounter (Signed)
Post ED Visit - Positive Culture Follow-up  Culture report reviewed by antimicrobial stewardship pharmacist:  []  Elenor Quinones, Pharm.D. []  Heide Guile, Pharm.D., BCPS AQ-ID []  Parks Neptune, Pharm.D., BCPS []  Alycia Rossetti, Pharm.D., BCPS []  [place], [place].D., BCPS, AAHIVP []  Legrand Como, Pharm.D., BCPS, AAHIVP []  Salome Arnt, PharmD, BCPS [x]  Wynell Balloon, PharmD []  Vincenza Hews, PharmD, BCPS  Positive urine culture Treated with cephalexin, organism sensitive to the same and no further patient follow-up is required at this time.  Hazle Nordmann 01/20/2018, 1:05 PM

## 2018-01-20 NOTE — Patient Instructions (Signed)
[facility] Discharge Instructions for Patients Receiving Chemotherapy   Beginning January 23rd 2017 lab work for the Cancer Center will be done in the  Main lab at [facility] on 1st floor. If you have a lab appointment with the Cancer Center please come in thru the  Main Entrance and check in at the main information desk   Today you received the following chemotherapy agents Herceptin and Perjeta. Follow-up as scheduled. Call clinic for any questions or concerns  To help prevent nausea and vomiting after your treatment, we encourage you to take your nausea medication   If you develop nausea and vomiting, or diarrhea that is not controlled by your medication, call the clinic.  The clinic phone number is [phone]. Office hours are Monday-Friday 8:30am-5:00pm.  BELOW ARE SYMPTOMS THAT SHOULD BE REPORTED IMMEDIATELY:  *FEVER GREATER THAN 101.0 F  *CHILLS WITH OR WITHOUT FEVER  NAUSEA AND VOMITING THAT IS NOT CONTROLLED WITH YOUR NAUSEA MEDICATION  *UNUSUAL SHORTNESS OF BREATH  *UNUSUAL BRUISING OR BLEEDING  TENDERNESS IN MOUTH AND THROAT WITH OR WITHOUT PRESENCE OF ULCERS  *URINARY PROBLEMS  *BOWEL PROBLEMS  UNUSUAL RASH Items with * indicate a potential emergency and should be followed up as soon as possible. If you have an emergency after office hours please contact your primary care physician or go to the nearest emergency department.  Please call the clinic during office hours if you have any questions or concerns.   You may also contact the Patient Navigator at [phone] should you have any questions or need assistance in obtaining follow up care.      Resources For Cancer Patients and their Caregivers ? American Cancer Society: Can assist with transportation, wigs, general needs, runs Look Good Feel Better.        1-888-227-6333 ? Cancer Care: Provides financial assistance, online support groups, medication/co-pay assistance.   1-800-813-HOPE (4673) ? [facility] Assists [place] cancer patients and their families through emotional , educational and financial support.  336-427-4357 ? [place] DSS Where to apply for food stamps, Medicaid and utility assistance. 336-342-1394 ? [facility]: Transportation to medical appointments. 336-347-2287 ? Social Security Administration: May apply for disability if have a Stage IV cancer. 336-342-7796 1-800-772-1213 ? [place] Aging, Disability and Transit Services: Assists with nutrition, care and transit needs. 336-349-2343         

## 2018-01-21 ENCOUNTER — Encounter: Payer: Self-pay | Admitting: Obstetrics and Gynecology

## 2018-01-21 ENCOUNTER — Ambulatory Visit (INDEPENDENT_AMBULATORY_CARE_PROVIDER_SITE_OTHER): Payer: Self-pay | Admitting: Obstetrics and Gynecology

## 2018-01-21 VITALS — BP 100/70 | HR 98 | Ht 65.0 in | Wt 127.0 lb

## 2018-01-21 DIAGNOSIS — C50811 Malignant neoplasm of overlapping sites of right female breast: Secondary | ICD-10-CM

## 2018-01-21 DIAGNOSIS — Z170 Estrogen receptor positive status [ER+]: Secondary | ICD-10-CM

## 2018-01-21 DIAGNOSIS — Z4009 Encounter for prophylactic removal of other organ: Secondary | ICD-10-CM

## 2018-01-21 DIAGNOSIS — Z4002 Encounter for prophylactic removal of ovary: Secondary | ICD-10-CM

## 2018-01-21 NOTE — Progress Notes (Signed)
[facility] Visit  _0 @            Patient name: Angel Hale MRN [id number]  Date of birth: [birth year]  CC & HPI:  Bo Angel Hale is a 47 y.o. female presenting today for discussion of her treatment options for oophorectomy.  She is currently on ovarian suppression and would like to be considered for surgical oophorectomy.  She is a gravida 0 para 0 She has concerns over whether she would feel "like a woman" after oophorectomy.  Of explained to her that with the chemical oophorectomy and that she is currently experiencing she should notice only minimal additional changes.  Some vaginal thinning vaginal atrophy and decreased sexual interest can be expected.  Patient encouraged to maintain sexual activity rather than avoid intimacy as recurrent intimacy seems to reduce the tenderness and discomfort ROS:  ROS review of Dr. Tomie Hale last note indicates she is high risk for recurrence, multicentric disease. 1.  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  Patient is pre-menopausal.  We discussed the option of surgical oophorectomy versus using LHRH agonists.  She would like to have surgical oophorectomy as soon as she completes radiation therapy.    Pertinent History Reviewed:   Reviewed: Significant for  Medical         Past Medical History:  Diagnosis Date  . Cancer [facility])    breast cancer                              Surgical Hx:    Past Surgical History:  Procedure Laterality Date  . BUNIONECTOMY Left 2000  . EXTERNAL FIXATION LEG Left 02/16/2017   Procedure: EXTERNAL FIXATION LEG;  Surgeon: Mcarthur Rossetti, MD;  Location: [place];  Service: Orthopedics;  Laterality: Left;  . HARDWARE REMOVAL Left 05/21/2017   Procedure: REMOVAL OF RETAINED PROMINENT SCREW LEFT ANKLE;  Surgeon: Mcarthur Rossetti, MD;  Location: [place];  Service: Orthopedics;  Laterality: Left;  Marland Kitchen MASTECTOMY MODIFIED RADICAL Right 10/14/2017   Procedure: MASTECTOMY MODIFIED RADICAL;  Surgeon: Virl Cagey, MD;  [facility];  Service: General;  Laterality: Right;  . ORIF ANKLE FRACTURE Left 03/03/2017  . ORIF ANKLE FRACTURE Left 03/03/2017   Procedure: OPEN REDUCTION INTERNAL FIXATION (ORIF) LEFT PILON FRACTURE AND LEFT LATERAL MALLEOLUS;  Surgeon: Mcarthur Rossetti, MD;  Location: [place];  Service: Orthopedics;  Laterality: Left;  . OVARIAN CYST REMOVAL Left 1991  . PORTACATH PLACEMENT    . PORTACATH PLACEMENT Left 05/20/2017   Procedure: INSERTION PORT-A-CATH;  Surgeon: Virl Cagey, MD;  [facility];  Service: General;  Laterality: Left;  . WISDOM TOOTH EXTRACTION     Medications: Reviewed & Updated - see associated section                       Current Outpatient Medications:  .  cephALEXin (KEFLEX) 500 MG capsule, Take 1 capsule (500 mg total) by mouth 2 (two) times daily for 7 days., Disp: 14 capsule, Rfl: 0 .  Homeopathic Products (FRANKINCENSE UPLIFTING) OIL, Take 1 capsule by mouth daily as needed (for home remedy, taking orally, not internally). , Disp: , Rfl:  .  LAVENDER OIL  PO, Take 1 capsule by mouth daily as needed (for home remedy)., Disp: , Rfl:  .  Multiple Vitamins-Minerals (ZINC PO), Take 1 capsule by mouth daily., Disp: , Rfl:  .  Pertuzumab (PERJETA IV), Inject into the vein. Every 3 weeks, Disp: , Rfl:  .  traMADol (ULTRAM) 50 MG tablet, Take 1 tablet (50 mg total) by mouth every 6 (six) hours as needed for severe pain., Disp: 60 tablet, Rfl: 0 .  Trastuzumab (HERCEPTIN IV), Inject into the vein. Every 3 weeks, Disp: , Rfl:  .  Coenzyme Q10 (CO Q-10 PO), Take 1 tablet by mouth daily., Disp: , Rfl:  .  Cyanocobalamin (B-12 PO), Take 1 tablet by mouth daily., Disp: , Rfl:  .   lidocaine-prilocaine (EMLA) cream, Apply to affected area once, Disp: 30 g, Rfl: 3 .  LORazepam (ATIVAN) 0.5 MG tablet, Take 1 tablet (0.5 mg total) by mouth every 6 (six) hours as needed (Nausea or vomiting). (Patient not taking: Reported on 01/21/2018), Disp: 30 tablet, Rfl: 0 .  methocarbamol (ROBAXIN) 500 MG tablet, Take 1 tablet (500 mg total) by mouth every 6 (six) hours as needed for muscle spasms. (Patient not taking: Reported on 01/21/2018), Disp: 40 tablet, Rfl: 1 .  ondansetron (ZOFRAN ODT) 4 MG disintegrating tablet, Take 1 tablet (4 mg total) by mouth every 8 (eight) hours as needed for nausea. (Patient not taking: Reported on 01/21/2018), Disp: 10 tablet, Rfl: 0 .  ondansetron (ZOFRAN) 8 MG tablet, Take 1 tablet (8 mg total) by mouth 2 (two) times daily as needed for refractory nausea / vomiting. Start on day 3 after chemo. (Patient not taking: Reported on 01/21/2018), Disp: 30 tablet, Rfl: 1 .  prochlorperazine (COMPAZINE) 10 MG tablet, Take 1 tablet (10 mg total) by mouth every 6 (six) hours as needed (Nausea or vomiting). (Patient not taking: Reported on 01/21/2018), Disp: 30 tablet, Rfl: 1 .  vitamin C (ASCORBIC ACID) 500 MG tablet, Take 1 tablet (500 mg total) by mouth daily. (Patient not taking: Reported on 01/21/2018), Disp: 90 tablet, Rfl: 0   Social History: Reviewed -  reports that she quit smoking about 11 months ago. Her smoking use included cigarettes. She has a 10.00 pack-year smoking history. She has never used smokeless tobacco.  Objective Findings:  Vitals: Blood pressure 100/70, pulse 98, height 5' 5" (1.651 m), weight 127 lb (57.6 kg), last menstrual period 06/03/2017.  PHYSICAL EXAMINATION General appearance - alert, well appearing, and in no distress, oriented to person, place, and time and normal appearing weight Mental status - alert, oriented to person, place, and time, normal mood, behavior, speech, dress, motor activity, and thought processes Chest - clear to  auscultation, no wheezes, rales or rhonchi, symmetric air entry Heart -  Abdomen -  Breasts -  Skin -     Assessment & Plan:   A:  1.  Multicentric Her-2 pos, E+ , P + breast cancer in premenopausal female 2. Desire for oophorectomy to allow Aromatase inhibitor therapy instead of LHRH suppression  P:  1. Discuss future cancer therapy with Dr. Delton Coombes, likely proceed with Laparoscopic supracervical hysterectomy with Bilateral salpingoophorectomy.  2 no record of recent Pap, will obtain u/s pelvis and pap ASAP to rule out gyn pathology.

## 2018-02-02 ENCOUNTER — Telehealth (HOSPITAL_COMMUNITY): Payer: Self-pay

## 2018-02-02 DIAGNOSIS — Z170 Estrogen receptor positive status [ER+]: Principal | ICD-10-CM

## 2018-02-02 DIAGNOSIS — C50811 Malignant neoplasm of overlapping sites of right female breast: Secondary | ICD-10-CM

## 2018-02-02 MED ORDER — TRAMADOL HCL 50 MG PO TABS: 50.0000 mg | ORAL_TABLET | Freq: Four times a day (QID) | ORAL | 0 refills | Status: DC | PRN

## 2018-02-02 NOTE — Telephone Encounter (Signed)
-----   Message from Louis Meckel sent at 02/01/2018 12:02 PM EDT ----- Regarding: refill Patient needs refill on traMADol (ULTRAM) 50 MG tablet.  Patient uses [facility] , [place].  Thanks

## 2018-02-02 NOTE — Telephone Encounter (Signed)
Reviewed with Dr. Delton Coombes who okayed refill. Prescription sent to patients pharmacy.

## 2018-02-10 ENCOUNTER — Encounter (HOSPITAL_COMMUNITY): Payer: Self-pay

## 2018-02-10 ENCOUNTER — Inpatient Hospital Stay (HOSPITAL_COMMUNITY): Payer: Self-pay | Attending: Internal Medicine | Admitting: Hematology

## 2018-02-10 ENCOUNTER — Encounter (HOSPITAL_COMMUNITY): Payer: Self-pay | Admitting: Hematology

## 2018-02-10 ENCOUNTER — Inpatient Hospital Stay (HOSPITAL_COMMUNITY): Payer: Self-pay

## 2018-02-10 VITALS — BP 102/61 | HR 65 | Temp 97.5°F | Resp 18 | Wt 128.2 lb

## 2018-02-10 DIAGNOSIS — C50811 Malignant neoplasm of overlapping sites of right female breast: Secondary | ICD-10-CM | POA: Insufficient documentation

## 2018-02-10 DIAGNOSIS — R200 Anesthesia of skin: Secondary | ICD-10-CM | POA: Insufficient documentation

## 2018-02-10 DIAGNOSIS — R918 Other nonspecific abnormal finding of lung field: Secondary | ICD-10-CM | POA: Insufficient documentation

## 2018-02-10 DIAGNOSIS — Z87891 Personal history of nicotine dependence: Secondary | ICD-10-CM | POA: Insufficient documentation

## 2018-02-10 DIAGNOSIS — M25572 Pain in left ankle and joints of left foot: Secondary | ICD-10-CM | POA: Insufficient documentation

## 2018-02-10 DIAGNOSIS — Z5112 Encounter for antineoplastic immunotherapy: Secondary | ICD-10-CM | POA: Insufficient documentation

## 2018-02-10 DIAGNOSIS — C773 Secondary and unspecified malignant neoplasm of axilla and upper limb lymph nodes: Secondary | ICD-10-CM | POA: Insufficient documentation

## 2018-02-10 DIAGNOSIS — Z79899 Other long term (current) drug therapy: Secondary | ICD-10-CM | POA: Insufficient documentation

## 2018-02-10 DIAGNOSIS — Z9011 Acquired absence of right breast and nipple: Secondary | ICD-10-CM | POA: Insufficient documentation

## 2018-02-10 DIAGNOSIS — Z170 Estrogen receptor positive status [ER+]: Secondary | ICD-10-CM | POA: Insufficient documentation

## 2018-02-10 DIAGNOSIS — Z923 Personal history of irradiation: Secondary | ICD-10-CM | POA: Insufficient documentation

## 2018-02-10 DIAGNOSIS — Z800 Family history of malignant neoplasm of digestive organs: Secondary | ICD-10-CM | POA: Insufficient documentation

## 2018-02-10 LAB — CBC WITH DIFFERENTIAL/PLATELET
Basophils Absolute: 0 10*3/uL (ref 0.0–0.1)
Basophils Relative: 0 %
EOS PCT: 2 %
Eosinophils Absolute: 0.1 10*3/uL (ref 0.0–0.7)
HEMATOCRIT: 38.1 % (ref 36.0–46.0)
HEMOGLOBIN: 12.7 g/dL (ref 12.0–15.0)
LYMPHS PCT: 22 %
Lymphs Abs: 0.9 10*3/uL (ref 0.7–4.0)
MCH: 31.4 pg (ref 26.0–34.0)
MCHC: 33.3 g/dL (ref 30.0–36.0)
MCV: 94.3 fL (ref 78.0–100.0)
Monocytes Absolute: 0.3 10*3/uL (ref 0.1–1.0)
Monocytes Relative: 7 %
NEUTROS PCT: 69 %
Neutro Abs: 2.9 10*3/uL (ref 1.7–7.7)
Platelets: 243 10*3/uL (ref 150–400)
RBC: 4.04 MIL/uL (ref 3.87–5.11)
RDW: 14.1 % (ref 11.5–15.5)
WBC: 4.1 10*3/uL (ref 4.0–10.5)

## 2018-02-10 LAB — COMPREHENSIVE METABOLIC PANEL
ALBUMIN: 4 g/dL (ref 3.5–5.0)
ALT: 19 U/L (ref 14–54)
AST: 20 U/L (ref 15–41)
Alkaline Phosphatase: 106 U/L (ref 38–126)
Anion gap: 8 (ref 5–15)
BUN: 10 mg/dL (ref 6–20)
CO2: 29 mmol/L (ref 22–32)
Calcium: 9.6 mg/dL (ref 8.9–10.3)
Chloride: 107 mmol/L (ref 101–111)
Creatinine, Ser: 0.68 mg/dL (ref 0.44–1.00)
GFR calc Af Amer: >60 mL/min (ref >60)
Glucose, Bld: 85 mg/dL (ref 65–99)
Potassium: 3.9 mmol/L (ref 3.5–5.1)
Sodium: 144 mmol/L (ref 135–145)
Total Bilirubin: 0.6 mg/dL (ref 0.3–1.2)
Total Protein: 7.4 g/dL (ref 6.5–8.1)

## 2018-02-10 MED ORDER — DIPHENHYDRAMINE HCL 25 MG PO CAPS
25.0000 mg | ORAL_CAPSULE | Freq: Once | ORAL | Status: AC
  Administered 2018-02-10: 25 mg via ORAL
  Filled 2018-02-10: qty 1

## 2018-02-10 MED ORDER — HEPARIN SOD (PORK) LOCK FLUSH 100 UNIT/ML IV SOLN
500.0000 [IU] | Freq: Once | INTRAVENOUS | Status: AC | PRN
  Administered 2018-02-10: 500 [IU]
  Filled 2018-02-10: qty 5

## 2018-02-10 MED ORDER — ACETAMINOPHEN 325 MG PO TABS
650.0000 mg | ORAL_TABLET | Freq: Once | ORAL | Status: AC
  Administered 2018-02-10: 650 mg via ORAL
  Filled 2018-02-10: qty 2

## 2018-02-10 MED ORDER — SODIUM CHLORIDE 0.9% FLUSH
10.0000 mL | INTRAVENOUS | Status: DC | PRN
  Administered 2018-02-10: 10 mL
  Filled 2018-02-10: qty 10

## 2018-02-10 MED ORDER — SODIUM CHLORIDE 0.9 % IV SOLN
420.0000 mg | Freq: Once | INTRAVENOUS | Status: AC
  Administered 2018-02-10: 420 mg via INTRAVENOUS
  Filled 2018-02-10: qty 14

## 2018-02-10 MED ORDER — SODIUM CHLORIDE 0.9 % IV SOLN
6.0000 mg/kg | Freq: Once | INTRAVENOUS | Status: AC
  Administered 2018-02-10: 336 mg via INTRAVENOUS
  Filled 2018-02-10: qty 16

## 2018-02-10 MED ORDER — SODIUM CHLORIDE 0.9 % IV SOLN
Freq: Once | INTRAVENOUS | Status: AC
Start: 2018-02-10 — End: 2018-02-10
  Administered 2018-02-10: 13:00:00 via INTRAVENOUS

## 2018-02-10 NOTE — Progress Notes (Signed)
[place], [place] [address]   CLINIC:  Medical Oncology/Hematology  PCP:  Patient, No Pcp Per No address on file None   REASON FOR VISIT:  Follow-up for right breast cancer, HER-2 positive.  CURRENT THERAPY: Herceptin and Pertuzumab.  BRIEF ONCOLOGIC HISTORY:    Malignant neoplasm of overlapping sites of right breast in female, estrogen receptor positive ([place])   05/12/2017 Initial Biopsy    (R) breast (LIQ): IDC, grade 3; ER+ (80%), PR+ (10%), HER2+ (ratio 2.56). Ki67 80%.        05/12/2017 Procedure    Additional (R) breast biopsy (UOQ): IDC, grade 3; ER+ (80%), PR+ (50%), HER2+ (ratio 2.77), Ki67 90%.       05/12/2017 Procedure    (R) axillary lymph node biopsy: Metastatic carcinoma; ER+ (80%), PR+ (30%), HER2+ (ratio 2.99), Ki67 80%.       05/12/2017 Imaging    Mammogram/breast US: Targeted ultrasound is performed, showing multiple irregular hypoechoic masses. In the 6 o'clock region of the right breast is a large irregular mass spans at least 4.8 cm. In the 4:00 to 5:00 region of the right breast an irregular mass measures 4.5 cm. In the 930-10 o'clock position of the right breast centered 9-10 cm from the nipple is an irregular 4.0 x 3.8 x 1.9 cm hypoechoic mass. In the 9:30 position of the right breast at the lateral margin is a probable abnormal intramammary lymph node measuring 1.3 cm greatest diameter. In the 10 o'clock position of the right breast far lateral is a hypoechoic dermal nodule that measures 0.5 cm. Diffuse skin thickening of the right breast measures up to 4.5 mm.      05/14/2017 Tumor Marker      Ref. Range 05/14/2017 15:30  CA 15-3 Latest Ref Range: 0.0 - 25.0 U/mL 115.9 (H)     Ref. Range 05/14/2017 15:30  CA 27.29 Latest Ref Range: 0.0 - 38.6 U/mL 157.7 (H)        05/15/2017 Imaging    CT chest/abd/pelvis:  IMPRESSION: 1. Numerous right breast masses and metastatic right axillary lymphadenopathy. Findings  suspicious for chest wall invasion along the lower lateral aspect of the breast. 2. Two small indeterminate right lower lobe pulmonary nodules will require surveillance. 3. No findings for metastatic disease involving the abdomen/pelvis or osseous structures.      05/15/2017 Imaging    Bone scan:  IMPRESSION: No evidence of metastatic disease.      05/18/2017 Echocardiogram    LV EF: 60% -   65%      05/20/2017 Procedure    Port-a-cath placement       05/25/2017 Initial Diagnosis    Malignant neoplasm of overlapping sites of right breast in female, estrogen receptor positive ([place])      05/27/2017 - 09/10/2017 Neo-Adjuvant Chemotherapy    TCHPx 6 cycles      10/14/2017 Surgery    Right modified radical mastectomy and lymph node biopsy on 10/14/2017, with complete pathological response, [id number]        CANCER STAGING: Cancer Staging Malignant neoplasm of overlapping sites of right breast in female, estrogen receptor positive ([place]) Staging form: Breast, AJCC 8th Edition - Clinical stage from 05/25/2017: Stage IIIB (cT4b, cN3a, cM0, G3, ER+, PR+, HER2+) - Signed by Holley Bouche, NP on 07/07/2017  Malignant neoplasm of overlapping sites of right female breast [facility]) Staging form: Breast, AJCC 8th Edition - Clinical: No Stage Recommended (ycT4b, cN3a, cM0, G3, ER: Positive, PR:  Positive, HER2: Positive) - Signed by Twana First, MD on 05/25/2017    INTERVAL HISTORY:  Angel Hale 47 y.o. female returns for follow-up and next treatment.  She denies any symptoms of PND or orthopnea.  She reports that she has been getting emotional lately.  She is slightly depressed that she had mobility issues from left ankle injury and breast cancer one after the other.  She has met with Dr. Glo Herring to discuss oophorectomy options.  She is going to see him again tomorrow.  Otherwise she denies any new symptoms.  Energy levels are 75%.   REVIEW OF SYSTEMS:  Review of Systems    Musculoskeletal:       Left ankle pain.  All other systems reviewed and are negative.    PAST MEDICAL/SURGICAL HISTORY:  Past Medical History:  Diagnosis Date  . Cancer [facility])    breast cancer   Past Surgical History:  Procedure Laterality Date  . BUNIONECTOMY Left 2000  . EXTERNAL FIXATION LEG Left 02/16/2017   Procedure: EXTERNAL FIXATION LEG;  Surgeon: Mcarthur Rossetti, MD;  Location: [place];  Service: Orthopedics;  Laterality: Left;  . HARDWARE REMOVAL Left 05/21/2017   Procedure: REMOVAL OF RETAINED PROMINENT SCREW LEFT ANKLE;  Surgeon: Mcarthur Rossetti, MD;  Location: [place];  Service: Orthopedics;  Laterality: Left;  Marland Kitchen MASTECTOMY MODIFIED RADICAL Right 10/14/2017   Procedure: MASTECTOMY MODIFIED RADICAL;  Surgeon: Virl Cagey, MD;  [facility];  Service: General;  Laterality: Right;  . ORIF ANKLE FRACTURE Left 03/03/2017  . ORIF ANKLE FRACTURE Left 03/03/2017   Procedure: OPEN REDUCTION INTERNAL FIXATION (ORIF) LEFT PILON FRACTURE AND LEFT LATERAL MALLEOLUS;  Surgeon: Mcarthur Rossetti, MD;  Location: [place];  Service: Orthopedics;  Laterality: Left;  . OVARIAN CYST REMOVAL Left 1991  . PORTACATH PLACEMENT    . PORTACATH PLACEMENT Left 05/20/2017   Procedure: INSERTION PORT-A-CATH;  Surgeon: Virl Cagey, MD;  Location[facility];  Service: General;  Laterality: Left;  . WISDOM TOOTH EXTRACTION       SOCIAL HISTORY:  Social History   Socioeconomic History  . Marital status: Married    Spouse name: Not on file  . Number of children: Not on file  . Years of education: Not on file  . Highest education level: Not on file  Occupational History  . Not on file  Social Needs  . Financial resource strain: Not on file  . Food insecurity:    Worry: Not on file    Inability: Not on file  . Transportation needs:    Medical: Not on file    Non-medical: Not on file  Tobacco Use  . Smoking status: Former Smoker    Packs/day: 1.00    Years:  10.00    Pack years: 10.00    Types: Cigarettes    Last attempt to quit: 02/16/2017    Years since quitting: 0.9  . Smokeless tobacco: Never Used  Substance and Sexual Activity  . Alcohol use: Yes    Comment: occasional  . Drug use: No  . Sexual activity: Yes    Birth control/protection: None  Lifestyle  . Physical activity:    Days per week: Not on file    Minutes per session: Not on file  . Stress: Not on file  Relationships  . Social connections:    Talks on phone: Not on file    Gets together: Not on file    Attends religious service: Not on file  Active member of club or organization: Not on file    Attends meetings of clubs or organizations: Not on file    Relationship status: Not on file  . Intimate partner violence:    Fear of current or ex partner: Not on file    Emotionally abused: Not on file    Physically abused: Not on file    Forced sexual activity: Not on file  Other Topics Concern  . Not on file  Social History Narrative  . Not on file    FAMILY HISTORY:  Family History  Problem Relation Age of Onset  . Colon cancer Father     CURRENT MEDICATIONS:  Outpatient Encounter Medications as of 02/10/2018  Medication Sig  . Coenzyme Q10 (CO Q-10 PO) Take 1 tablet by mouth daily.  . Cyanocobalamin (B-12 PO) Take 1 tablet by mouth daily.  . Homeopathic Products (FRANKINCENSE UPLIFTING) OIL Take 1 capsule by mouth daily as needed (for home remedy, taking orally, not internally).   Marland Kitchen LAVENDER OIL PO Take 1 capsule by mouth daily as needed (for home remedy).  Marland Kitchen lidocaine-prilocaine (EMLA) cream Apply to affected area once  . LORazepam (ATIVAN) 0.5 MG tablet Take 1 tablet (0.5 mg total) by mouth every 6 (six) hours as needed (Nausea or vomiting). (Patient not taking: Reported on 02/10/2018)  . methocarbamol (ROBAXIN) 500 MG tablet Take 1 tablet (500 mg total) by mouth every 6 (six) hours as needed for muscle spasms. (Patient not taking: Reported on 02/10/2018)  .  Multiple Vitamins-Minerals (ZINC PO) Take 1 capsule by mouth daily.  . ondansetron (ZOFRAN ODT) 4 MG disintegrating tablet Take 1 tablet (4 mg total) by mouth every 8 (eight) hours as needed for nausea.  . ondansetron (ZOFRAN) 8 MG tablet Take 1 tablet (8 mg total) by mouth 2 (two) times daily as needed for refractory nausea / vomiting. Start on day 3 after chemo.  Marland Kitchen Pertuzumab (PERJETA IV) Inject into the vein. Every 3 weeks  . prochlorperazine (COMPAZINE) 10 MG tablet Take 1 tablet (10 mg total) by mouth every 6 (six) hours as needed (Nausea or vomiting).  . traMADol (ULTRAM) 50 MG tablet Take 1 tablet (50 mg total) by mouth every 6 (six) hours as needed for severe pain.  . Trastuzumab (HERCEPTIN IV) Inject into the vein. Every 3 weeks  . vitamin C (ASCORBIC ACID) 500 MG tablet Take 1 tablet (500 mg total) by mouth daily.   No facility-administered encounter medications on file as of 02/10/2018.     ALLERGIES:  Allergies  Allergen Reactions  . Penicillins Nausea And Vomiting    Has patient had a PCN reaction causing immediate rash, facial/tongue/throat swelling, SOB or lightheadedness with hypotension: No Has patient had a PCN reaction causing severe rash involving mucus membranes or skin necrosis: No Has patient had a PCN reaction that required hospitalization: No Has patient had a PCN reaction occurring within the last 10 years: No If all of the above answers are "NO", then may proceed with Cephalosporin use.      PHYSICAL EXAM:  ECOG Performance status: 1.  I have reviewed her vitals.  Blood pressure is 103/88, pulse rate is 63, respiratory, temperature 98.2. Physical Exam   LABORATORY DATA:  I have reviewed the labs as listed.  CBC    Component Value Date/Time   WBC 4.1 02/10/2018 1115   RBC 4.04 02/10/2018 1115   HGB 12.7 02/10/2018 1115   HCT 38.1 02/10/2018 1115   PLT 243 02/10/2018 1115  MCV 94.3 02/10/2018 1115   MCH 31.4 02/10/2018 1115   MCHC 33.3 02/10/2018  1115   RDW 14.1 02/10/2018 1115   LYMPHSABS 0.9 02/10/2018 1115   MONOABS 0.3 02/10/2018 1115   EOSABS 0.1 02/10/2018 1115   BASOSABS 0.0 02/10/2018 1115   CMP Latest Ref Rng & Units 02/10/2018 12/30/2017 11/18/2017  Glucose 65 - 99 mg/dL 85 111(H) 92  BUN 6 - 20 mg/dL _0 Creatinine 0.44 - 1.00 mg/dL 0.68 0.63 0.78  Sodium 135 - 145 mmol/L 144 138 137  Potassium 3.5 - 5.1 mmol/L 3.9 3.7 3.9  Chloride 101 - 111 mmol/L 107 101 101  CO2 22 - 32 mmol/L _1 Calcium 8.9 - 10.3 mg/dL 9.6 9.4 9.6  Total Protein 6.5 - 8.1 g/dL 7.4 7.5 7.8  Total Bilirubin 0.3 - 1.2 mg/dL 0.6 0.6 0.8  Alkaline Phos 38 - 126 U/L 106 107 92  AST 15 - 41 U/L 20 37 22  ALT 14 - 54 U/L _2 ASSESSMENT & PLAN:   Malignant neoplasm of overlapping sites of right breast in female, estrogen receptor positive ([place]) 1.  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 -As she has a high risk disease (based on SOFT and TEXT trails), I have recommended ovarian ablation with the aromatase inhibitor therapy (preferably exemestane). Patient is pre-menopausal.  At last visit, we discussed the option of surgical oophorectomy versus using LHRH agonists.  She has met with her GYN Dr. Glo Herring and will see him again tomorrow.  Her last echo was on 11/26/2017 which shows EF of 60 to 65%.  She will proceed with Herceptin and Pertuzumab today.  I will see her back in 6 weeks for follow-up.  I will arrange another echocardiogram in first week of July.  We plan to start her on exemestane at next visit.  2.  High risk drug monitoring for Herceptin: She does not have any clinical signs or symptoms of CHF.  We reviewed the reports of last 2D echocardiogram on 11/26/2017 which shows ejection fraction of 60 to  65%.  3.  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  She does have on and off numbness in the fingertips.  Even though she was prescribed gabapentin 100 mg 3 times daily, she never took it because of concern for side effects.      Orders placed this encounter:  Orders Placed This Encounter  Procedures  . Comprehensive metabolic panel  . CBC with Differential  . ECHOCARDIOGRAM COMPLETE      Derek Jack, MD [place] [phone]

## 2018-02-10 NOTE — Patient Instructions (Addendum)
Your last treatment is scheduled for October 2019.   [facility] Discharge Instructions for Patients Receiving Chemotherapy  You received herceptin and perjeta today.    If you develop nausea and vomiting that is not controlled by your nausea medication, call the clinic.   BELOW ARE SYMPTOMS THAT SHOULD BE REPORTED IMMEDIATELY:  *FEVER GREATER THAN 100.5 F  *CHILLS WITH OR WITHOUT FEVER  NAUSEA AND VOMITING THAT IS NOT CONTROLLED WITH YOUR NAUSEA MEDICATION  *UNUSUAL SHORTNESS OF BREATH  *UNUSUAL BRUISING OR BLEEDING  TENDERNESS IN MOUTH AND THROAT WITH OR WITHOUT PRESENCE OF ULCERS  *URINARY PROBLEMS  *BOWEL PROBLEMS  UNUSUAL RASH Items with * indicate a potential emergency and should be followed up as soon as possible.  Feel free to call the clinic should you have any questions or concerns. The clinic phone number is [phone].  Please show the [facility] at check-in to the Emergency Department and triage nurse.

## 2018-02-10 NOTE — Assessment & Plan Note (Addendum)
1.  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 -As she has a high risk disease (based on SOFT and TEXT trails), I have recommended ovarian ablation with the aromatase inhibitor therapy (preferably exemestane). Patient is pre-menopausal.  At last visit, we discussed the option of surgical oophorectomy versus using LHRH agonists.  She has met with her GYN Dr. Glo Herring and will see him again tomorrow.  Her last echo was on 11/26/2017 which shows EF of 60 to 65%.  She will proceed with Herceptin and Pertuzumab today.  I will see her back in 6 weeks for follow-up.  I will arrange another echocardiogram in first week of July.  We plan to start her on exemestane at next visit.  2.  High risk drug monitoring for Herceptin: She does not have any clinical signs or symptoms of CHF.  We reviewed the reports of last 2D echocardiogram on 11/26/2017 which shows ejection fraction of 60 to 65%.  3.  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  She does have on and off numbness in the fingertips.  Even though she was prescribed gabapentin 100 mg 3 times daily, she never took it because of concern for side effects.

## 2018-02-10 NOTE — Progress Notes (Signed)
ECHO 11/26/2017.  Per Dr. Delton Coombes ok to treat patient with  CMET pending.    Patient tolerated therapy with no complaints voiced.  Port site clean and dry with no bruising or swelling noted at site.  Flushed with good blood return noted before and after administration of therapy.  Band aid applied.  VSS with discharge and left ambulatory with family.  No s/s of distress noted.

## 2018-02-11 ENCOUNTER — Encounter: Payer: Self-pay | Admitting: Obstetrics and Gynecology

## 2018-02-11 ENCOUNTER — Ambulatory Visit (INDEPENDENT_AMBULATORY_CARE_PROVIDER_SITE_OTHER): Payer: Self-pay | Admitting: Obstetrics and Gynecology

## 2018-02-11 ENCOUNTER — Other Ambulatory Visit (HOSPITAL_COMMUNITY)
Admission: RE | Admit: 2018-02-11 | Discharge: 2018-02-11 | Disposition: A | Discharge status: Home / Self Care | Payer: Self-pay | Source: Ambulatory Visit | Attending: Obstetrics and Gynecology | Admitting: Obstetrics and Gynecology

## 2018-02-11 VITALS — BP 105/67 | HR 65 | Ht 65.0 in | Wt 129.4 lb

## 2018-02-11 DIAGNOSIS — Z124 Encounter for screening for malignant neoplasm of cervix: Secondary | ICD-10-CM | POA: Insufficient documentation

## 2018-02-11 NOTE — Progress Notes (Signed)
Patient ID: Angel Hale, female   DOB: 1971-02-11, 47 y.o.   MRN: [id number] Preoperative History and Physical  Angel Hale is a 47 y.o. No obstetric history on file. here for surgical management of ovarian suppression. She was last seen here on 01/21/2018. She was Dr. Delton Coombes yesterday, 02/10/2018. She would prefer to have only have her ovaries and tubes removed. She would like to stop LHRH suppression and start aromatase inhibitor therapy instead. 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 She denies any other symptoms or complaints at this time.   No significant preoperative concerns.  Proposed surgery: To be determined after results of U/S  Past Medical History:  Diagnosis Date  . Cancer [facility])    breast cancer   Past Surgical History:  Procedure Laterality Date  . BUNIONECTOMY Left 2000  . EXTERNAL FIXATION LEG Left 02/16/2017   Procedure: EXTERNAL FIXATION LEG;  Surgeon: Mcarthur Rossetti, MD;  Location: [place];  Service: Orthopedics;  Laterality: Left;  . HARDWARE REMOVAL Left 05/21/2017   Procedure: REMOVAL OF RETAINED PROMINENT SCREW LEFT ANKLE;  Surgeon: Mcarthur Rossetti, MD;  Location: [place];  Service: Orthopedics;  Laterality: Left;  Marland Kitchen MASTECTOMY MODIFIED RADICAL Right 10/14/2017   Procedure: MASTECTOMY MODIFIED RADICAL;  Surgeon: Virl Cagey, MD;  Location[facility];  Service: General;  Laterality: Right;  . ORIF ANKLE FRACTURE Left 03/03/2017  . ORIF ANKLE FRACTURE Left 03/03/2017   Procedure: OPEN REDUCTION INTERNAL FIXATION (ORIF) LEFT PILON FRACTURE AND LEFT LATERAL MALLEOLUS;  Surgeon: Mcarthur Rossetti, MD;  Location: [facility];  Service: Orthopedics;  Laterality: Left;  . OVARIAN CYST REMOVAL Left 1991  . PORTACATH PLACEMENT    . PORTACATH PLACEMENT Left 05/20/2017   Procedure: INSERTION PORT-A-CATH;  Surgeon: Virl Cagey, MD;  Location: [facility];  Service: General;  Laterality: Left;  . WISDOM TOOTH  EXTRACTION     OB History  No data available  Patient denies any other pertinent gynecologic issues.   Current Outpatient Medications on File Prior to Visit  Medication Sig Dispense Refill  . Homeopathic Products (FRANKINCENSE UPLIFTING) OIL Take 1 capsule by mouth daily as needed (for home remedy, taking orally, not internally).     Marland Kitchen LAVENDER OIL PO Take 1 capsule by mouth daily as needed (for home remedy).    Marland Kitchen lidocaine-prilocaine (EMLA) cream Apply to affected area once 30 g 3  . Pertuzumab (PERJETA IV) Inject into the vein. Every 3 weeks    . traMADol (ULTRAM) 50 MG tablet Take 1 tablet (50 mg total) by mouth every 6 (six) hours as needed for severe pain. 60 tablet 0  . Trastuzumab (HERCEPTIN IV) Inject into the vein. Every 3 weeks     No current facility-administered medications on file prior to visit.    Allergies  Allergen Reactions  . Penicillins Nausea And Vomiting    Has patient had a PCN reaction causing immediate rash, facial/tongue/throat swelling, SOB or lightheadedness with hypotension: No Has patient had a PCN reaction causing severe rash involving mucus membranes or skin necrosis: No Has patient had a PCN reaction that required hospitalization: No Has patient had a PCN reaction occurring within the last 10 years: No If all of the above answers are "NO", then may proceed with Cephalosporin use.     Social History:   reports that she quit smoking about a year ago. Her smoking use included cigarettes. She has a 10.00 pack-year smoking history. She  has never used smokeless tobacco. She reports that she drinks alcohol. She reports that she does not use drugs.  Family History  Problem Relation Age of Onset  . Colon cancer Father     Review of Systems: Noncontributory  PHYSICAL EXAM: Blood pressure 105/67, pulse 65, height '5\' 5"'  (1.651 m), weight 129 lb 6.4 oz (58.7 kg). General appearance - alert, well appearing, and in no distress Chest - clear to auscultation,  no wheezes, rales or rhonchi, symmetric air entry  Has left subclavian central line access Heart - normal rate and regular rhythm Abdomen - soft, nontender, nondistended, no masses or organomegaly Pelvic - examination  Vagina - atrophic, thinned tissues, good support, small speculum necessary  Cervix - nulliparous, bleeds with pap  Adnexa - negative Extremities - peripheral pulses normal, no pedal edema, no clubbing or cyanosis  Discussion: 1. Discussed with pt risks and benefits of her different surgery options and how it will affect her body since she is Multicentric HER-2 +, Estrogen Receptor + and Progesterone Receptor + breast cancer.  At end of discussion, pt had opportunity to ask questions and has no further questions at this time.   Specific discussion of surgery options as noted above. Greater than 50% was spent in counseling and coordination of care with the patient.   Total time greater than: 25 minutes.   Labs: Results for orders placed or performed in visit on 02/10/18 (from the past 336 hour(s))  Comprehensive metabolic panel   Collection Time: 02/10/18 11:15 AM  Result Value Ref Range   Sodium 144 135 - 145 mmol/L   Potassium 3.9 3.5 - 5.1 mmol/L   Chloride 107 101 - 111 mmol/L   CO2 29 22 - 32 mmol/L   Glucose, Bld 85 65 - 99 mg/dL   BUN 10 6 - 20 mg/dL   Creatinine, Ser 0.68 0.44 - 1.00 mg/dL   Calcium 9.6 8.9 - 10.3 mg/dL   Total Protein 7.4 6.5 - 8.1 g/dL   Albumin 4.0 3.5 - 5.0 g/dL   AST 20 15 - 41 U/L   ALT 19 14 - 54 U/L   Alkaline Phosphatase 106 38 - 126 U/L   Total Bilirubin 0.6 0.3 - 1.2 mg/dL   GFR calc non Af Amer >60 >60 mL/min   GFR calc Af Amer >60 >60 mL/min   Anion gap 8 5 - 15  CBC with Differential   Collection Time: 02/10/18 11:15 AM  Result Value Ref Range   WBC 4.1 4.0 - 10.5 K/uL   RBC 4.04 3.87 - 5.11 MIL/uL   Hemoglobin 12.7 12.0 - 15.0 g/dL   HCT 38.1 36.0 - 46.0 %   MCV 94.3 78.0 - 100.0 fL   MCH 31.4 26.0 - 34.0 pg   MCHC  33.3 30.0 - 36.0 g/dL   RDW 14.1 11.5 - 15.5 %   Platelets 243 150 - 400 K/uL   Neutrophils Relative % 69 %   Neutro Abs 2.9 1.7 - 7.7 K/uL   Lymphocytes Relative 22 %   Lymphs Abs 0.9 0.7 - 4.0 K/uL   Monocytes Relative 7 %   Monocytes Absolute 0.3 0.1 - 1.0 K/uL   Eosinophils Relative 2 %   Eosinophils Absolute 0.1 0.0 - 0.7 K/uL   Basophils Relative 0 %   Basophils Absolute 0.0 0.0 - 0.1 K/uL    Imaging Studies: No results found.  Assessment: Patient Active Problem List   Diagnosis Date Noted  . Carcinoma of lower-outer quadrant  of right breast in female, estrogen receptor positive ([place]) 11/25/2017  . Breast cancer ([place]) 10/14/2017  . Malignant neoplasm of overlapping sites of right breast in female, estrogen receptor positive ([place]) 05/25/2017  . Painful orthopaedic hardware ([place]) left ankle 05/21/2017  . Malignant neoplasm of overlapping sites of right female breast ([place]) 05/14/2017  . Breast mass, right 05/12/2017  . Closed left pilon fracture, initial encounter 02/16/2017    Plan: 1. Transvaginal U/S next week 2. Surgery to be determined after getting the results of the U/S.  3. Probable laparoscopic BSO   By signing my name below, I, Margit Banda, attest that this documentation has been prepared under the direction and in the presence of Jonnie Kind, MD. Electronically Signed: Margit Banda, Medical Scribe. 02/11/18. 8:53 AM.  I personally performed the services described in this documentation, which was SCRIBED in my presence. The recorded information has been reviewed and considered accurate. It has been edited as necessary during review. Jonnie Kind, MD

## 2018-02-15 LAB — CYTOLOGY - PAP
CHLAMYDIA, DNA PROBE: NEGATIVE
Diagnosis: NEGATIVE
HPV: NOT DETECTED
Neisseria Gonorrhea: NEGATIVE

## 2018-02-16 ENCOUNTER — Encounter: Payer: Self-pay | Admitting: Radiation Oncology

## 2018-02-16 NOTE — Progress Notes (Signed)
Ms. Samano presents for follow up of radiation completed 01/13/18 to her Right chest wall and supraclavicular region. Her skin has healed well. She has hyperpigmentation present. She is not using cream at this time. I have suggested vitamin E cream over her radiation site. She has see Dr. Glo Herring regarding a probable laprascopic BSO, and is scheduled for a preliminary [place] on 02/23/18. She continues to see Dr. Delton Coombes at [facility] and is receiving Herceptin and Pertuzumab every 3 weeks.   BP 105/73   Pulse 66   Temp 98.1 F (36.7 C)   Resp 18   Ht 5\' 5"  (1.651 m)   Wt 126 lb 6.4 oz (57.3 kg)   SpO2 99% Comment: room air  BMI 21.03 kg/m    Wt Readings from Last 3 Encounters:  02/19/18 126 lb 6.4 oz (57.3 kg)  02/11/18 129 lb 6.4 oz (58.7 kg)  02/10/18 128 lb 3.2 oz (58.2 kg)

## 2018-02-19 ENCOUNTER — Encounter: Payer: Self-pay | Admitting: Radiation Oncology

## 2018-02-19 ENCOUNTER — Other Ambulatory Visit: Payer: Self-pay

## 2018-02-19 ENCOUNTER — Ambulatory Visit
Admission: RE | Admit: 2018-02-19 | Discharge: 2018-02-19 | Disposition: A | Discharge status: Home / Self Care | Payer: Self-pay | Source: Ambulatory Visit | Attending: Radiation Oncology | Admitting: Radiation Oncology

## 2018-02-19 VITALS — BP 105/73 | HR 66 | Temp 98.1°F | Resp 18 | Ht 65.0 in | Wt 126.4 lb

## 2018-02-19 DIAGNOSIS — Z923 Personal history of irradiation: Secondary | ICD-10-CM | POA: Insufficient documentation

## 2018-02-19 DIAGNOSIS — Z08 Encounter for follow-up examination after completed treatment for malignant neoplasm: Secondary | ICD-10-CM | POA: Insufficient documentation

## 2018-02-19 DIAGNOSIS — C50511 Malignant neoplasm of lower-outer quadrant of right female breast: Secondary | ICD-10-CM | POA: Insufficient documentation

## 2018-02-19 DIAGNOSIS — Z79899 Other long term (current) drug therapy: Secondary | ICD-10-CM | POA: Insufficient documentation

## 2018-02-19 DIAGNOSIS — Z170 Estrogen receptor positive status [ER+]: Secondary | ICD-10-CM | POA: Insufficient documentation

## 2018-02-19 DIAGNOSIS — Z880 Allergy status to penicillin: Secondary | ICD-10-CM | POA: Insufficient documentation

## 2018-02-19 HISTORY — DX: Personal history of irradiation: Z92.3

## 2018-02-19 NOTE — Progress Notes (Signed)
Radiation Oncology         [phone]) [phone] ________________________________  Name: Angel Hale MRN: [id number]  Date: 02/19/2018  DOB: 01/29/1971  Follow-Up Visit Note  Outpatient  CC: Patient, No Pcp Per  Derek Jack, MD  Diagnosis and Prior Radiotherapy:    ICD-10-CM   1. 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 The patient reports her skin is healing well and she is not using cream at this time. She denies pain and reports she no longer requires pain medication. The patient does note she has been dealing with depression since her diagnosis.    She continues to see Dr. Delton Coombes at [facility] and is receiving Herceptin and Pertuzumab every 3 weeks. The patient will begin Exemestane during her next visit with Dr. Delton Coombes. The patient was evaluated by Dr. Glo Herring regarding a possible laparoscopic BSO. She is scheduled for a preliminary ultrasound on 02/23/18.                  ALLERGIES:  is allergic to penicillins.  Meds: Current Outpatient Medications  Medication Sig Dispense Refill  . Homeopathic Products (FRANKINCENSE UPLIFTING) OIL Take 1 capsule by mouth daily as needed (for home remedy, taking orally, not internally).     Marland Kitchen LAVENDER OIL PO Take 1 capsule by mouth daily as needed (for home remedy).    Marland Kitchen lidocaine-prilocaine (EMLA) cream Apply to affected area once 30 g 3  .  Pertuzumab (PERJETA IV) Inject into the vein. Every 3 weeks    . traMADol (ULTRAM) 50 MG tablet Take 1 tablet (50 mg total) by mouth every 6 (six) hours as needed for severe pain. 60 tablet 0  . Trastuzumab (HERCEPTIN IV) Inject into the vein. Every 3 weeks     No current facility-administered medications for this encounter.     Physical Findings: The patient is in no acute distress. Patient is alert and oriented.  height is _0  (1.651 m) and weight is 126 lb 6.4 oz (57.3 kg). Her temperature is 98.1 F (36.7 C). Her blood pressure is 105/73 and her pulse is 66. Her respiration is 18 and oxygen saturation is 99%. .    Satisfactory skin healing in radiotherapy fields. Faint amount of hyperpigmentation and erythema in the treatment area. Skin is intact.   Lab Findings: Lab Results  Component Value Date   WBC 4.1 02/10/2018   HGB 12.7 02/10/2018   HCT 38.1 02/10/2018   MCV 94.3 02/10/2018   PLT 243 02/10/2018    Radiographic Findings: No results found.  Impression/Plan: Healing well from radiotherapy to the breast tissue.  I referred the patient to social work for counseling.  Continue skin care with topical Vitamin E Oil and / or lotion for at least 2 more months for further healing.  I encouraged her to continue with yearly mammography as appropriate (for intact breast tissue) and followup with medical oncology. I will see her back on  an as-needed basis. I have encouraged her to call if she has any issues or concerns in the future. I wished her the very best.   I spent 10 minutes face to face with the patient and more than 50% of that time was spent in counseling and/or coordination of care. _____________________________________   Eppie Gibson, MD  This document serves as a record of services personally performed by Eppie Gibson, MD. It was created on her behalf by Bethann Humble, a trained medical scribe. The creation of this record is based on the scribe's personal  observations and the provider's statements to them. This document has been checked and approved by the attending provider.

## 2018-02-20 NOTE — Progress Notes (Signed)
I have not completed f/u conversation with Dr Jamey Reas. Nonetheless , the pt is a candidate for laparoscopic Bilateral salpingoophorectomy to allow her to be treated for high risk multicentric breast cancer with the optimal therapy, which requires postmenopausal status or ovarian supression.  I will ask office (angie) to schedule laparoscopic Bilateral salpingoophorectomy in July, as schedule permits.

## 2018-02-22 ENCOUNTER — Ambulatory Visit (HOSPITAL_COMMUNITY)
Admission: RE | Admit: 2018-02-22 | Discharge: 2018-02-22 | Disposition: A | Discharge status: Home / Self Care | Payer: Self-pay | Source: Ambulatory Visit | Attending: Hematology | Admitting: Hematology

## 2018-02-22 ENCOUNTER — Other Ambulatory Visit: Payer: Self-pay

## 2018-02-22 ENCOUNTER — Other Ambulatory Visit: Payer: Self-pay | Admitting: Obstetrics and Gynecology

## 2018-02-22 DIAGNOSIS — C50811 Malignant neoplasm of overlapping sites of right female breast: Secondary | ICD-10-CM | POA: Insufficient documentation

## 2018-02-22 DIAGNOSIS — Z01818 Encounter for other preprocedural examination: Secondary | ICD-10-CM

## 2018-02-22 DIAGNOSIS — Z853 Personal history of malignant neoplasm of breast: Secondary | ICD-10-CM

## 2018-02-22 DIAGNOSIS — Z170 Estrogen receptor positive status [ER+]: Secondary | ICD-10-CM | POA: Insufficient documentation

## 2018-02-22 NOTE — Progress Notes (Signed)
*  PRELIMINARY RESULTS* Echocardiogram 2D Echocardiogram has been performed.  Angel Hale 02/22/2018, 11:31 AM

## 2018-02-23 ENCOUNTER — Ambulatory Visit (INDEPENDENT_AMBULATORY_CARE_PROVIDER_SITE_OTHER): Payer: Self-pay

## 2018-02-23 ENCOUNTER — Encounter (HOSPITAL_COMMUNITY): Payer: Self-pay | Admitting: General Practice

## 2018-02-23 ENCOUNTER — Telehealth (HOSPITAL_COMMUNITY): Payer: Self-pay | Admitting: General Practice

## 2018-02-23 DIAGNOSIS — Z01818 Encounter for other preprocedural examination: Secondary | ICD-10-CM

## 2018-02-23 DIAGNOSIS — Z853 Personal history of malignant neoplasm of breast: Secondary | ICD-10-CM

## 2018-02-23 NOTE — Progress Notes (Signed)
PELVIC US TA/TV: Homogeneous anteverted uterus wnl,normal ovaries bilat,EEC 1.9 mm,no free fluid,pelvic pain during ultrasound,Peggy chaperoned TV

## 2018-02-23 NOTE — Telephone Encounter (Signed)
[facility] CSW Progress Note  Referral received from [facility] MD to schedule patient w CSW for counseling.  CSW called number provided, unable to reach patient or leave message as mailbox is full.  Will continue to attempt to reach patient and schedule.  Edwyna Shell, LCSW Clinical Social Worker Phone:  [phone]

## 2018-02-23 NOTE — Progress Notes (Signed)
[facility] CSW Progress Notes  Patient returned call. Discussed struggle w ongoing anxiety for both self and partner post cancer treatment.  Requests opportunity to discuss this w CSW.  Appt made for Monday July 8 10 AM.  CSW provided brief supportive psychoeducation re cancer treatment and trauma. Encouraged positive self care.    Edwyna Shell, LCSW Clinical Social Worker Phone:  [phone]

## 2018-02-26 ENCOUNTER — Telehealth (HOSPITAL_COMMUNITY): Payer: Self-pay | Admitting: General Practice

## 2018-02-26 NOTE — Telephone Encounter (Signed)
[facility] CSW Progress Notes  Appointment w CSW changed to 2 PM on Mon July 8.  Patient aware.  Edwyna Shell, LCSW Clinical Social Worker Phone:  [phone]

## 2018-03-03 ENCOUNTER — Encounter (HOSPITAL_COMMUNITY): Payer: Self-pay

## 2018-03-03 ENCOUNTER — Inpatient Hospital Stay (HOSPITAL_COMMUNITY): Payer: Self-pay | Attending: Internal Medicine

## 2018-03-03 ENCOUNTER — Other Ambulatory Visit: Payer: Self-pay

## 2018-03-03 VITALS — BP 100/59 | HR 67 | Temp 97.5°F | Resp 18 | Wt 125.8 lb

## 2018-03-03 DIAGNOSIS — F419 Anxiety disorder, unspecified: Secondary | ICD-10-CM | POA: Insufficient documentation

## 2018-03-03 DIAGNOSIS — Z923 Personal history of irradiation: Secondary | ICD-10-CM | POA: Insufficient documentation

## 2018-03-03 DIAGNOSIS — Z5112 Encounter for antineoplastic immunotherapy: Secondary | ICD-10-CM | POA: Insufficient documentation

## 2018-03-03 DIAGNOSIS — R918 Other nonspecific abnormal finding of lung field: Secondary | ICD-10-CM | POA: Insufficient documentation

## 2018-03-03 DIAGNOSIS — R232 Flushing: Secondary | ICD-10-CM | POA: Insufficient documentation

## 2018-03-03 DIAGNOSIS — Z170 Estrogen receptor positive status [ER+]: Secondary | ICD-10-CM | POA: Insufficient documentation

## 2018-03-03 DIAGNOSIS — Z87891 Personal history of nicotine dependence: Secondary | ICD-10-CM | POA: Insufficient documentation

## 2018-03-03 DIAGNOSIS — Z9011 Acquired absence of right breast and nipple: Secondary | ICD-10-CM | POA: Insufficient documentation

## 2018-03-03 DIAGNOSIS — Z800 Family history of malignant neoplasm of digestive organs: Secondary | ICD-10-CM | POA: Insufficient documentation

## 2018-03-03 DIAGNOSIS — C50811 Malignant neoplasm of overlapping sites of right female breast: Secondary | ICD-10-CM | POA: Insufficient documentation

## 2018-03-03 DIAGNOSIS — Z79899 Other long term (current) drug therapy: Secondary | ICD-10-CM | POA: Insufficient documentation

## 2018-03-03 DIAGNOSIS — Z79811 Long term (current) use of aromatase inhibitors: Secondary | ICD-10-CM | POA: Insufficient documentation

## 2018-03-03 MED ORDER — DIPHENHYDRAMINE HCL 25 MG PO CAPS
25.0000 mg | ORAL_CAPSULE | Freq: Once | ORAL | Status: AC
  Administered 2018-03-03: 25 mg via ORAL

## 2018-03-03 MED ORDER — DIPHENHYDRAMINE HCL 25 MG PO CAPS
ORAL_CAPSULE | ORAL | Status: AC
  Filled 2018-03-03: qty 2

## 2018-03-03 MED ORDER — ACETAMINOPHEN 325 MG PO TABS
ORAL_TABLET | ORAL | Status: AC
  Filled 2018-03-03: qty 2

## 2018-03-03 MED ORDER — ACETAMINOPHEN 325 MG PO TABS
650.0000 mg | ORAL_TABLET | Freq: Once | ORAL | Status: AC
  Administered 2018-03-03: 650 mg via ORAL

## 2018-03-03 MED ORDER — HEPARIN SOD (PORK) LOCK FLUSH 100 UNIT/ML IV SOLN
500.0000 [IU] | Freq: Once | INTRAVENOUS | Status: AC | PRN
  Administered 2018-03-03: 500 [IU]
  Filled 2018-03-03: qty 5

## 2018-03-03 MED ORDER — SODIUM CHLORIDE 0.9 % IV SOLN
Freq: Once | INTRAVENOUS | Status: AC
  Administered 2018-03-03: 13:00:00 via INTRAVENOUS

## 2018-03-03 MED ORDER — TRASTUZUMAB CHEMO 150 MG IV SOLR
6.0000 mg/kg | Freq: Once | INTRAVENOUS | Status: AC
  Administered 2018-03-03: 336 mg via INTRAVENOUS
  Filled 2018-03-03: qty 16

## 2018-03-03 MED ORDER — SODIUM CHLORIDE 0.9 % IV SOLN
420.0000 mg | Freq: Once | INTRAVENOUS | Status: AC
  Administered 2018-03-03: 420 mg via INTRAVENOUS
  Filled 2018-03-03: qty 14

## 2018-03-03 NOTE — Progress Notes (Signed)
Tolerated infusions w/o adverse reaction.  Alert, in no distress.  VSS.  Discharged ambulatory in c/o spouse.  

## 2018-03-04 ENCOUNTER — Ambulatory Visit (HOSPITAL_COMMUNITY): Payer: Self-pay | Admitting: Hematology

## 2018-03-04 ENCOUNTER — Ambulatory Visit (HOSPITAL_COMMUNITY): Payer: Self-pay

## 2018-03-10 NOTE — Patient Instructions (Signed)
Angel Hale  03/10/2018     @[email or web address] @   Your procedure is scheduled on  03/16/2018   Report to [facility] at  850   A.M.  Call this number if you have problems the morning of surgery:  [phone]   Remember:  Do not eat or drink after midnight.  You may drink clear liquids until  12 midnight 03/15/2018 .  Clear liquids allowed are:                    Water, Juice (non-citric and without pulp), Carbonated beverages, Clear Tea, Black Coffee only and Plain Jell-O only    Take these medicines the morning of surgery with A SIP OF WATER  Ultram.    Do not wear jewelry, make-up or nail polish.  Do not wear lotions, powders, or perfumes, or deodorant.  Do not shave 48 hours prior to surgery.  Men may shave face and neck.  Do not bring valuables to the hospital.  [facility] is not responsible for any belongings or valuables.  Contacts, dentures or bridgework may not be worn into surgery.  Leave your suitcase in the car.  After surgery it may be brought to your room.  For patients admitted to the hospital, discharge time will be determined by your treatment team.  Patients discharged the day of surgery will not be allowed to drive home.   Name and phone number of your driver:   family Special instructions:  None  Please read over the following fact sheets that you were given. Anesthesia Post-op Instructions and Care and Recovery After Surgery       Bilateral Salpingo-Oophorectomy Bilateral salpingo-oophorectomy is the surgical removal of both fallopian tubes and both ovaries. The ovaries are reproductive organs that produce eggs in women. The fallopian tubes allow eggs to move from the ovaries to the uterus. You may need this procedure if you:  Have had your uterus removed. This procedure is usually done after the uterus is removed.  Have cancer of the fallopian tubes or ovaries.  Have a high risk of cancer of the fallopian tubes or  ovaries.  There are three different techniques that can be used for this procedure:  Open. One large incision will be made in your abdomen.  Laparoscopic. A thin, lighted tube with a small camera on the end (laparoscope) will be used to help perform the procedure. The laparoscope will allow your surgeon to make several small incisions in the abdomen instead of one large incision.  Robot-assisted. A computer will be used to control surgical instruments that are attached to robotic arms. A laparoscope may also be used with this technique.  As a result of this procedure, you will become sterile (unable to become pregnant), and you will go into menopause (no longer able to have menstrual periods). You may develop symptoms of menopause such as hot flashes, night sweats, and mood changes. Your sex drive may also be affected. Tell a health care provider about:  Any allergies you have.  All medicines you are taking, including vitamins, herbs, eye drops, creams, and over-the-counter medicines.  Any problems you or family members have had with anesthetic medicines.  Any blood disorders you have.  Any surgeries you have had.  Any medical conditions you have.  Whether you are pregnant or may be pregnant. What are the risks? Generally, this is a safe procedure. However, problems may occur, including:  Infection.  Bleeding.  Allergic reactions to medicines.  Damage to other structures or organs.  Blood clots in the legs or lungs.  What happens before the procedure? Staying hydrated Follow instructions from your health care provider about hydration, which may include:  Up to 2 hours before the procedure - you may continue to drink clear liquids, such as water, clear fruit juice, black coffee, and plain tea.  Eating and drinking restrictions Follow instructions from your health care provider about eating and drinking, which may include:  8 hours before the procedure - stop eating  heavy meals or foods such as meat, fried foods, or fatty foods.  6 hours before the procedure - stop eating light meals or foods, such as toast or cereal.  6 hours before the procedure - stop drinking milk or drinks that contain milk.  2 hours before the procedure - stop drinking clear liquids.  Medicines  Ask your health care provider about: ? Changing or stopping your regular medicines. This is especially important if you are taking diabetes medicines or blood thinners. ? Taking medicines such as aspirin and ibuprofen. These medicines can thin your blood. Do not take these medicines before your procedure if your health care provider instructs you not to.  You may be given antibiotic medicine to help prevent infection. General instructions  Do not smoke for at least 2 weeks before your procedure or as told by your health care provider.  You may have an exam or testing.  You may have a blood or urine sample taken.  Ask your health care provider how your surgical site will be marked or identified.  Plan to have someone take you home from the hospital.  If you will be going home right after the procedure, plan to have someone with you for 24 hours. What happens during the procedure?  To reduce your risk of infection: ? Your health care team will wash or sanitize their hands. ? Your skin will be washed with soap. ? Hair may be removed from the surgical area.  An IV tube will be inserted into one of your veins.  You will be given one or more of the following: ? A medicine to help you relax (sedative). ? A medicine to make you fall asleep (general anesthetic).  A thin tube (catheter) will be inserted through your urethra and into your bladder. The catheter drains urine during your procedure.  Depending on the type of surgery you are having, your surgeon will do one of the following: ? Make one incision in your abdomen (open surgery). ? Make two small incisions in your abdomen  (laparoscopic surgery). The laparoscope will be passed through one incision, and surgical instruments will be passed through the other. ? Make several small incisions in your abdomen (robot-assisted surgery). A laparoscope and other surgical instruments may be passed through the incisions.  Your fallopian tubes and ovaries will be cut away from the uterus and removed.  Your blood vessels will be clamped and tied to prevent too much bleeding.  The incision(s) in your abdomen will be closed with stitches (sutures) or staples.  A bandage (dressing) may be placed over your incision(s). The procedure may vary among health care providers and hospitals. What happens after the procedure?  Your blood pressure, heart rate, breathing rate, and blood oxygen level will be monitored until the medicines you were given have worn off.  You may continue to receive fluids and medicines through an IV tube.  You may continue to  have a catheter draining your urine.  You may have to wear compression stockings. These stockings help to prevent blood clots and reduce swelling in your legs.  You will be given pain medicine as needed.  Do not drive for 24 hours if you received a sedative. Summary  Bilateral salpingo-oophorectomy is a procedure to remove both fallopian tubes and both ovaries.  There are three different techniques that can be used for this procedure, including open, laparoscopic, and robotic. Talk with your health care provider about how your procedure will be done.  As a result of this procedure, you will become sterile and you will go into menopause.  Plan to have someone take you home from the hospital. This information is not intended to replace advice given to you by your health care provider. Make sure you discuss any questions you have with your health care provider. Document Released: 08/11/2005 Document Revised: 09/15/2016 Document Reviewed: 09/15/2016 Elsevier Interactive Patient  Education  2018 [place].  Bilateral Salpingo-Oophorectomy, Care After This sheet gives you information about how to care for yourself after your procedure. Your health care provider may also give you more specific instructions. If you have problems or questions, contact your health care provider. What can I expect after the procedure? After the procedure, it is common to have:  Abdominal pain.  Some occasional vaginal bleeding (spotting).  Tiredness.  Symptoms of menopause, such as hot flashes, night sweats, or mood swings.  Follow these instructions at home: Incision care  Keep your incision area and your bandage (dressing) clean and dry.  Follow instructions from your health care provider about how to take care of your incision. Make sure you: ? Wash your hands with soap and water before you change your dressing. If soap and water are not available, use hand sanitizer. ? Change your dressing as told by your health care provider. ? Leave stitches (sutures), staples, skin glue, or adhesive strips in place. These skin closures may need to stay in place for 2 weeks or longer. If adhesive strip edges start to loosen and curl up, you may trim the loose edges. Do not remove adhesive strips completely unless your health care provider tells you to do that.  Check your incision area every day for signs of infection. Check for: ? Redness, swelling, or pain. ? Fluid or blood. ? Warmth. ? Pus or a bad smell. Activity  Do not drive or use heavy machinery while taking prescription pain medicine.  Do not drive for 24 hours if you received a medicine to help you relax (sedative) during your procedure.  Take frequent, short walks throughout the day. Rest when you get tired. Ask your health care provider what activities are safe for you.  Avoid activity that requires great effort. Also, avoid heavy lifting. Do not lift anything that is heavier than 10 lbs. (4.5 kg), or the limit that your  health care provider tells you, until he or she says that it is safe to do so.  Do not douche, use tampons, or have sex until your health care provider approves. General instructions  To prevent or treat constipation while you are taking prescription pain medicine, your health care provider may recommend that you: ? Drink enough fluid to keep your urine clear or pale yellow. ? Take over-the-counter or prescription medicines. ? Eat foods that are high in fiber, such as fresh fruits and vegetables, whole grains, and beans. ? Limit foods that are high in fat and processed sugars, such  as fried and sweet foods.  Take over-the-counter and prescription medicines only as told by your health care provider.  Do not take baths, swim, or use a hot tub until your health care provider approves. Ask your health care provider if you can take showers. You may only be allowed to take sponge baths for bathing.  Wear compression stockings as told by your health care provider. These stockings help to prevent blood clots and reduce swelling in your legs.  Keep all follow-up visits as told by your health care provider. This is important. Contact a health care provider if:  You have pain when you urinate.  You have pus or a bad smelling discharge coming from your vagina.  You have redness, swelling, or pain around your incision.  You have fluid or blood coming from your incision.  Your incision feels warm to the touch.  You have pus or a bad smell coming from your incision.  You have a fever.  Your incision starts to break open.  You have pain in the abdomen, and it gets worse or does not get better when you take medicine.  You develop a rash.  You develop nausea and vomiting.  You feel lightheaded. Get help right away if:  You develop pain in your chest or leg.  You become short of breath.  You faint.  You have increased bleeding from your vagina. Summary  After the procedure, it is  common to have pain, bleeding in the vagina, and symptoms of menopause.  Follow instructions from your health care provider about how to take care of your incision.  Follow instructions from your health care provider about activities and restrictions.  Check your incision every day for signs of infection and report any symptoms to your health care provider. This information is not intended to replace advice given to you by your health care provider. Make sure you discuss any questions you have with your health care provider. Document Released: 08/11/2005 Document Revised: 09/15/2016 Document Reviewed: 09/15/2016 Elsevier Interactive Patient Education  2018 [place] Anesthesia, Adult General anesthesia is the use of medicines to make a person "go to sleep" (be unconscious) for a medical procedure. General anesthesia is often recommended when a procedure:  Is long.  Requires you to be still or in an unusual position.  Is major and can cause you to lose blood.  Is impossible to do without general anesthesia.  The medicines used for general anesthesia are called general anesthetics. In addition to making you sleep, the medicines:  Prevent pain.  Control your blood pressure.  Relax your muscles.  Tell a health care provider about:  Any allergies you have.  All medicines you are taking, including vitamins, herbs, eye drops, creams, and over-the-counter medicines.  Any problems you or family members have had with anesthetic medicines.  Types of anesthetics you have had in the past.  Any bleeding disorders you have.  Any surgeries you have had.  Any medical conditions you have.  Any history of heart or lung conditions, such as heart failure, sleep apnea, or chronic obstructive pulmonary disease (COPD).  Whether you are pregnant or may be pregnant.  Whether you use tobacco, alcohol, marijuana, or street drugs.  Any history of Armed forces logistics/support/administrative officer.  Any history  of depression or anxiety. What are the risks? Generally, this is a safe procedure. However, problems may occur, including:  Allergic reaction to anesthetics.  Lung and heart problems.  Inhaling food or liquids from your  stomach into your lungs (aspiration).  Injury to nerves.  Waking up during your procedure and being unable to move (rare).  Extreme agitation or a state of mental confusion (delirium) when you wake up from the anesthetic.  Air in the bloodstream, which can lead to stroke.  These problems are more likely to develop if you are having a major surgery or if you have an advanced medical condition. You can prevent some of these complications by answering all of your health care provider's questions thoroughly and by following all pre-procedure instructions. General anesthesia can cause side effects, including:  Nausea or vomiting  A sore throat from the breathing tube.  Feeling cold or shivery.  Feeling tired, washed out, or achy.  Sleepiness or drowsiness.  Confusion or agitation.  What happens before the procedure? Staying hydrated Follow instructions from your health care provider about hydration, which may include:  Up to 2 hours before the procedure - you may continue to drink clear liquids, such as water, clear fruit juice, black coffee, and plain tea.  Eating and drinking restrictions Follow instructions from your health care provider about eating and drinking, which may include:  8 hours before the procedure - stop eating heavy meals or foods such as meat, fried foods, or fatty foods.  6 hours before the procedure - stop eating light meals or foods, such as toast or cereal.  6 hours before the procedure - stop drinking milk or drinks that contain milk.  2 hours before the procedure - stop drinking clear liquids.  Medicines  Ask your health care provider about: ? Changing or stopping your regular medicines. This is especially important if you are  taking diabetes medicines or blood thinners. ? Taking medicines such as aspirin and ibuprofen. These medicines can thin your blood. Do not take these medicines before your procedure if your health care provider instructs you not to. ? Taking new dietary supplements or medicines. Do not take these during the week before your procedure unless your health care provider approves them.  If you are told to take a medicine or to continue taking a medicine on the day of the procedure, take the medicine with sips of water. General instructions   Ask if you will be going home the same day, the following day, or after a longer hospital stay. ? Plan to have someone take you home. ? Plan to have someone stay with you for the first 24 hours after you leave the hospital or clinic.  For 3-6 weeks before the procedure, try not to use any tobacco products, such as cigarettes, chewing tobacco, and e-cigarettes.  You may brush your teeth on the morning of the procedure, but make sure to spit out the toothpaste. What happens during the procedure?  You will be given anesthetics through a mask and through an IV tube in one of your veins.  You may receive medicine to help you relax (sedative).  As soon as you are asleep, a breathing tube may be used to help you breathe.  An anesthesia specialist will stay with you throughout the procedure. He or she will help keep you comfortable and safe by continuing to give you medicines and adjusting the amount of medicine that you get. He or she will also watch your blood pressure, pulse, and oxygen levels to make sure that the anesthetics do not cause any problems.  If a breathing tube was used to help you breathe, it will be removed before you wake up. The  procedure may vary among health care providers and hospitals. What happens after the procedure?  You will wake up, often slowly, after the procedure is complete, usually in a recovery area.  Your blood pressure,  heart rate, breathing rate, and blood oxygen level will be monitored until the medicines you were given have worn off.  You may be given medicine to help you calm down if you feel anxious or agitated.  If you will be going home the same day, your health care provider may check to make sure you can stand, drink, and urinate.  Your health care providers will treat your pain and side effects before you go home.  Do not drive for 24 hours if you received a sedative.  You may: ? Feel nauseous and vomit. ? Have a sore throat. ? Have mental slowness. ? Feel cold or shivery. ? Feel sleepy. ? Feel tired. ? Feel sore or achy, even in parts of your body where you did not have surgery. This information is not intended to replace advice given to you by your health care provider. Make sure you discuss any questions you have with your health care provider. Document Released: 11/18/2007 Document Revised: 01/22/2016 Document Reviewed: 07/26/2015 Elsevier Interactive Patient Education  2018 [place] Anesthesia, Adult, Care After These instructions provide you with information about caring for yourself after your procedure. Your health care provider may also give you more specific instructions. Your treatment has been planned according to current medical practices, but problems sometimes occur. Call your health care provider if you have any problems or questions after your procedure. What can I expect after the procedure? After the procedure, it is common to have:  Vomiting.  A sore throat.  Mental slowness.  It is common to feel:  Nauseous.  Cold or shivery.  Sleepy.  Tired.  Sore or achy, even in parts of your body where you did not have surgery.  Follow these instructions at home: For at least 24 hours after the procedure:  Do not: ? Participate in activities where you could fall or become injured. ? Drive. ? Use heavy machinery. ? Drink alcohol. ? Take sleeping  pills or medicines that cause drowsiness. ? Make important decisions or sign legal documents. ? Take care of children on your own.  Rest. Eating and drinking  If you vomit, drink water, juice, or soup when you can drink without vomiting.  Drink enough fluid to keep your urine clear or pale yellow.  Make sure you have little or no nausea before eating solid foods.  Follow the diet recommended by your health care provider. General instructions  Have a responsible adult stay with you until you are awake and alert.  Return to your normal activities as told by your health care provider. Ask your health care provider what activities are safe for you.  Take over-the-counter and prescription medicines only as told by your health care provider.  If you smoke, do not smoke without supervision.  Keep all follow-up visits as told by your health care provider. This is important. Contact a health care provider if:  You continue to have nausea or vomiting at home, and medicines are not helpful.  You cannot drink fluids or start eating again.  You cannot urinate after 8-12 hours.  You develop a skin rash.  You have fever.  You have increasing redness at the site of your procedure. Get help right away if:  You have difficulty breathing.  You have chest pain.  You have unexpected bleeding.  You feel that you are having a life-threatening or urgent problem. This information is not intended to replace advice given to you by your health care provider. Make sure you discuss any questions you have with your health care provider. Document Released: 11/17/2000 Document Revised: 01/14/2016 Document Reviewed: 07/26/2015 Elsevier Interactive Patient Education  [employer].

## 2018-03-11 ENCOUNTER — Other Ambulatory Visit: Payer: Self-pay

## 2018-03-11 ENCOUNTER — Encounter (HOSPITAL_COMMUNITY)
Admission: RE | Admit: 2018-03-11 | Discharge: 2018-03-11 | Disposition: A | Discharge status: Home / Self Care | Payer: Self-pay | Source: Ambulatory Visit | Attending: Obstetrics and Gynecology | Admitting: Obstetrics and Gynecology

## 2018-03-11 ENCOUNTER — Other Ambulatory Visit: Payer: Self-pay | Admitting: Obstetrics and Gynecology

## 2018-03-11 ENCOUNTER — Encounter (HOSPITAL_COMMUNITY): Payer: Self-pay

## 2018-03-11 DIAGNOSIS — Z01812 Encounter for preprocedural laboratory examination: Secondary | ICD-10-CM | POA: Insufficient documentation

## 2018-03-11 LAB — COMPREHENSIVE METABOLIC PANEL
ALT: 14 U/L (ref 0–44)
AST: 17 U/L (ref 15–41)
Albumin: 4.3 g/dL (ref 3.5–5.0)
Alkaline Phosphatase: 97 U/L (ref 38–126)
Anion gap: 7 (ref 5–15)
BILIRUBIN TOTAL: 0.7 mg/dL (ref 0.3–1.2)
BUN: 10 mg/dL (ref 6–20)
CO2: 31 mmol/L (ref 22–32)
CREATININE: 0.77 mg/dL (ref 0.44–1.00)
Calcium: 9.8 mg/dL (ref 8.9–10.3)
Chloride: 104 mmol/L (ref 98–111)
GFR calc Af Amer: >60 mL/min (ref >60)
Glucose, Bld: 85 mg/dL (ref 70–99)
POTASSIUM: 3.9 mmol/L (ref 3.5–5.1)
Sodium: 142 mmol/L (ref 135–145)
TOTAL PROTEIN: 7.8 g/dL (ref 6.5–8.1)

## 2018-03-11 LAB — CBC
HEMATOCRIT: 42.8 % (ref 36.0–46.0)
Hemoglobin: 14.3 g/dL (ref 12.0–15.0)
MCH: 32.3 pg (ref 26.0–34.0)
MCHC: 33.4 g/dL (ref 30.0–36.0)
MCV: 96.6 fL (ref 78.0–100.0)
Platelets: 259 10*3/uL (ref 150–400)
RBC: 4.43 MIL/uL (ref 3.87–5.11)
RDW: 14.5 % (ref 11.5–15.5)
WBC: 5.1 10*3/uL (ref 4.0–10.5)

## 2018-03-11 LAB — TYPE AND SCREEN
ABO/RH(D): B POS
Antibody Screen: NEGATIVE

## 2018-03-11 LAB — URINALYSIS, ROUTINE W REFLEX MICROSCOPIC
BACTERIA UA: NONE SEEN
Bilirubin Urine: NEGATIVE
Glucose, UA: NEGATIVE mg/dL
KETONES UR: NEGATIVE mg/dL
Nitrite: NEGATIVE
PROTEIN: NEGATIVE mg/dL
Specific Gravity, Urine: 1.005 (ref 1.005–1.030)
pH: 8 (ref 5.0–8.0)

## 2018-03-11 LAB — HCG, SERUM, QUALITATIVE: PREG SERUM: NEGATIVE

## 2018-03-12 ENCOUNTER — Other Ambulatory Visit (HOSPITAL_COMMUNITY): Payer: Self-pay | Admitting: *Deleted

## 2018-03-12 DIAGNOSIS — C50811 Malignant neoplasm of overlapping sites of right female breast: Secondary | ICD-10-CM

## 2018-03-12 DIAGNOSIS — Z170 Estrogen receptor positive status [ER+]: Principal | ICD-10-CM

## 2018-03-12 MED ORDER — TRAMADOL HCL 50 MG PO TABS: 50.0000 mg | ORAL_TABLET | Freq: Four times a day (QID) | ORAL | 0 refills | Status: DC | PRN

## 2018-03-16 ENCOUNTER — Encounter (HOSPITAL_COMMUNITY): Payer: Self-pay | Admitting: *Deleted

## 2018-03-16 ENCOUNTER — Observation Stay (HOSPITAL_COMMUNITY)
Admission: RE | Admit: 2018-03-16 | Discharge: 2018-03-17 | Disposition: A | Discharge status: Home / Self Care | Payer: Self-pay | Source: Ambulatory Visit | Attending: Obstetrics and Gynecology | Admitting: Obstetrics and Gynecology

## 2018-03-16 ENCOUNTER — Ambulatory Visit (HOSPITAL_COMMUNITY): Payer: Self-pay | Admitting: Anesthesiology

## 2018-03-16 ENCOUNTER — Other Ambulatory Visit: Payer: Self-pay

## 2018-03-16 ENCOUNTER — Encounter (HOSPITAL_COMMUNITY)
Admission: RE | Disposition: A | Discharge status: Home / Self Care | Payer: Self-pay | Source: Ambulatory Visit | Attending: Obstetrics and Gynecology

## 2018-03-16 DIAGNOSIS — Z923 Personal history of irradiation: Secondary | ICD-10-CM | POA: Insufficient documentation

## 2018-03-16 DIAGNOSIS — Z87891 Personal history of nicotine dependence: Secondary | ICD-10-CM | POA: Insufficient documentation

## 2018-03-16 DIAGNOSIS — K660 Peritoneal adhesions (postprocedural) (postinfection): Secondary | ICD-10-CM | POA: Diagnosis present

## 2018-03-16 DIAGNOSIS — C50811 Malignant neoplasm of overlapping sites of right female breast: Secondary | ICD-10-CM

## 2018-03-16 DIAGNOSIS — C50511 Malignant neoplasm of lower-outer quadrant of right female breast: Secondary | ICD-10-CM

## 2018-03-16 DIAGNOSIS — Z4002 Encounter for prophylactic removal of ovary: Secondary | ICD-10-CM

## 2018-03-16 DIAGNOSIS — Z79899 Other long term (current) drug therapy: Secondary | ICD-10-CM | POA: Insufficient documentation

## 2018-03-16 DIAGNOSIS — N736 Female pelvic peritoneal adhesions (postinfective): Secondary | ICD-10-CM | POA: Insufficient documentation

## 2018-03-16 DIAGNOSIS — C773 Secondary and unspecified malignant neoplasm of axilla and upper limb lymph nodes: Secondary | ICD-10-CM | POA: Insufficient documentation

## 2018-03-16 DIAGNOSIS — Z170 Estrogen receptor positive status [ER+]: Secondary | ICD-10-CM

## 2018-03-16 DIAGNOSIS — Z90722 Acquired absence of ovaries, bilateral: Secondary | ICD-10-CM

## 2018-03-16 DIAGNOSIS — C50911 Malignant neoplasm of unspecified site of right female breast: Secondary | ICD-10-CM | POA: Diagnosis present

## 2018-03-16 DIAGNOSIS — Z880 Allergy status to penicillin: Secondary | ICD-10-CM | POA: Insufficient documentation

## 2018-03-16 DIAGNOSIS — Z800 Family history of malignant neoplasm of digestive organs: Secondary | ICD-10-CM | POA: Insufficient documentation

## 2018-03-16 HISTORY — PX: LAPAROSCOPIC BILATERAL SALPINGO OOPHERECTOMY: SHX5890

## 2018-03-16 HISTORY — PX: LAPAROSCOPIC LYSIS OF ADHESIONS: SHX5905

## 2018-03-16 SURGERY — SALPINGO-OOPHORECTOMY, BILATERAL, LAPAROSCOPIC
Anesthesia: General | Site: Abdomen

## 2018-03-16 MED ORDER — MIDAZOLAM HCL 5 MG/5ML IJ SOLN
INTRAMUSCULAR | Status: DC | PRN
  Administered 2018-03-16: 2 mg via INTRAVENOUS

## 2018-03-16 MED ORDER — ONDANSETRON HCL 4 MG/2ML IJ SOLN: 4.0000 mg | Freq: Four times a day (QID) | INTRAMUSCULAR | Status: DC | PRN

## 2018-03-16 MED ORDER — IBUPROFEN 600 MG PO TABS: 600.0000 mg | ORAL_TABLET | Freq: Four times a day (QID) | ORAL | Status: DC | PRN

## 2018-03-16 MED ORDER — ONDANSETRON HCL 4 MG/2ML IJ SOLN
INTRAMUSCULAR | Status: AC
  Filled 2018-03-16: qty 2

## 2018-03-16 MED ORDER — HYDROCODONE-ACETAMINOPHEN 7.5-325 MG PO TABS: 1.0000 | ORAL_TABLET | Freq: Once | ORAL | Status: DC | PRN

## 2018-03-16 MED ORDER — KETOROLAC TROMETHAMINE 30 MG/ML IJ SOLN: 30.0000 mg | Freq: Four times a day (QID) | INTRAMUSCULAR | Status: DC

## 2018-03-16 MED ORDER — SUGAMMADEX SODIUM 200 MG/2ML IV SOLN
INTRAVENOUS | Status: AC
  Filled 2018-03-16: qty 2

## 2018-03-16 MED ORDER — PROPOFOL 10 MG/ML IV BOLUS
INTRAVENOUS | Status: AC
  Filled 2018-03-16: qty 40

## 2018-03-16 MED ORDER — SUGAMMADEX SODIUM 200 MG/2ML IV SOLN
INTRAVENOUS | Status: DC | PRN
  Administered 2018-03-16: 113.4 mg via INTRAVENOUS

## 2018-03-16 MED ORDER — ONDANSETRON HCL 4 MG/2ML IJ SOLN: 4.0000 mg | Freq: Once | INTRAMUSCULAR | Status: DC | PRN

## 2018-03-16 MED ORDER — HYDROMORPHONE HCL 1 MG/ML IJ SOLN
0.2500 mg | INTRAMUSCULAR | Status: DC | PRN
  Administered 2018-03-16 (×4): 0.5 mg via INTRAVENOUS
  Filled 2018-03-16 (×4): qty 0.5

## 2018-03-16 MED ORDER — SUCCINYLCHOLINE CHLORIDE 20 MG/ML IJ SOLN
INTRAMUSCULAR | Status: AC
  Filled 2018-03-16: qty 1

## 2018-03-16 MED ORDER — SODIUM CHLORIDE 0.9 % IJ SOLN
INTRAMUSCULAR | Status: AC
  Filled 2018-03-16: qty 10

## 2018-03-16 MED ORDER — ONDANSETRON HCL 4 MG/2ML IJ SOLN
INTRAMUSCULAR | Status: DC | PRN
  Administered 2018-03-16: 4 mg via INTRAVENOUS

## 2018-03-16 MED ORDER — KETOROLAC TROMETHAMINE 30 MG/ML IJ SOLN
30.0000 mg | Freq: Four times a day (QID) | INTRAMUSCULAR | Status: DC
  Administered 2018-03-16 – 2018-03-17 (×3): 30 mg via INTRAVENOUS
  Filled 2018-03-16 (×3): qty 1

## 2018-03-16 MED ORDER — PROPOFOL 10 MG/ML IV BOLUS
INTRAVENOUS | Status: DC | PRN
  Administered 2018-03-16: 100 mg via INTRAVENOUS

## 2018-03-16 MED ORDER — FENTANYL CITRATE (PF) 100 MCG/2ML IJ SOLN
INTRAMUSCULAR | Status: AC
  Filled 2018-03-16: qty 2

## 2018-03-16 MED ORDER — ROCURONIUM BROMIDE 50 MG/5ML IV SOLN
INTRAVENOUS | Status: AC
  Filled 2018-03-16: qty 1

## 2018-03-16 MED ORDER — KETOROLAC TROMETHAMINE 30 MG/ML IJ SOLN
30.0000 mg | Freq: Once | INTRAMUSCULAR | Status: AC
  Administered 2018-03-16: 30 mg via INTRAVENOUS
  Filled 2018-03-16: qty 1

## 2018-03-16 MED ORDER — DEXAMETHASONE SODIUM PHOSPHATE 4 MG/ML IJ SOLN
INTRAMUSCULAR | Status: DC | PRN
  Administered 2018-03-16: 4 mg via INTRAVENOUS

## 2018-03-16 MED ORDER — FENTANYL CITRATE (PF) 100 MCG/2ML IJ SOLN
INTRAMUSCULAR | Status: DC | PRN
  Administered 2018-03-16: 50 ug via INTRAVENOUS
  Administered 2018-03-16: 25 ug via INTRAVENOUS
  Administered 2018-03-16: 50 ug via INTRAVENOUS
  Administered 2018-03-16: 25 ug via INTRAVENOUS
  Administered 2018-03-16 (×5): 50 ug via INTRAVENOUS

## 2018-03-16 MED ORDER — SODIUM CHLORIDE 0.9 % IR SOLN
Status: DC | PRN
  Administered 2018-03-16: 1000 mL
  Administered 2018-03-16: 3000 mL

## 2018-03-16 MED ORDER — KETOROLAC TROMETHAMINE 30 MG/ML IJ SOLN
30.0000 mg | Freq: Once | INTRAMUSCULAR | Status: AC | PRN
  Administered 2018-03-16: 30 mg via INTRAVENOUS
  Filled 2018-03-16: qty 1

## 2018-03-16 MED ORDER — SUCCINYLCHOLINE CHLORIDE 20 MG/ML IJ SOLN
INTRAMUSCULAR | Status: DC | PRN
  Administered 2018-03-16: 100 mg via INTRAVENOUS

## 2018-03-16 MED ORDER — LIDOCAINE HCL (CARDIAC) PF 100 MG/5ML IV SOSY
PREFILLED_SYRINGE | INTRAVENOUS | Status: DC | PRN
  Administered 2018-03-16: 30 mg via INTRAVENOUS

## 2018-03-16 MED ORDER — EPHEDRINE SULFATE 50 MG/ML IJ SOLN
INTRAMUSCULAR | Status: AC
  Filled 2018-03-16: qty 1

## 2018-03-16 MED ORDER — MIDAZOLAM HCL 2 MG/2ML IJ SOLN
INTRAMUSCULAR | Status: AC
  Filled 2018-03-16: qty 2

## 2018-03-16 MED ORDER — DEXAMETHASONE SODIUM PHOSPHATE 4 MG/ML IJ SOLN
INTRAMUSCULAR | Status: AC
  Filled 2018-03-16: qty 1

## 2018-03-16 MED ORDER — CEFAZOLIN SODIUM-DEXTROSE 2-4 GM/100ML-% IV SOLN
INTRAVENOUS | Status: AC
  Filled 2018-03-16: qty 100

## 2018-03-16 MED ORDER — PHENYLEPHRINE 40 MCG/ML (10ML) SYRINGE FOR IV PUSH (FOR BLOOD PRESSURE SUPPORT)
PREFILLED_SYRINGE | INTRAVENOUS | Status: AC
  Filled 2018-03-16: qty 10

## 2018-03-16 MED ORDER — TRAMADOL HCL 50 MG PO TABS
50.0000 mg | ORAL_TABLET | Freq: Four times a day (QID) | ORAL | Status: DC | PRN
Start: 2018-03-16 — End: 2018-03-17
  Administered 2018-03-16 – 2018-03-17 (×2): 50 mg via ORAL
  Filled 2018-03-16 (×2): qty 1

## 2018-03-16 MED ORDER — LACTATED RINGERS IV SOLN
INTRAVENOUS | Status: DC
  Administered 2018-03-16: 12:00:00 via INTRAVENOUS

## 2018-03-16 MED ORDER — HYDROMORPHONE HCL 1 MG/ML IJ SOLN: 1.0000 mg | INTRAMUSCULAR | Status: DC | PRN

## 2018-03-16 MED ORDER — HYDROMORPHONE HCL 1 MG/ML IJ SOLN
0.5000 mg | INTRAMUSCULAR | Status: DC | PRN
  Administered 2018-03-16: 0.5 mg via INTRAVENOUS
  Filled 2018-03-16: qty 0.5

## 2018-03-16 MED ORDER — ONDANSETRON HCL 4 MG PO TABS: 4.0000 mg | ORAL_TABLET | Freq: Four times a day (QID) | ORAL | Status: DC | PRN

## 2018-03-16 MED ORDER — SODIUM CHLORIDE 0.9 % IV SOLN
INTRAVENOUS | Status: DC
  Administered 2018-03-16 – 2018-03-17 (×3): via INTRAVENOUS

## 2018-03-16 MED ORDER — PANTOPRAZOLE SODIUM 40 MG PO TBEC
40.0000 mg | DELAYED_RELEASE_TABLET | Freq: Every day | ORAL | Status: DC
  Filled 2018-03-16: qty 1

## 2018-03-16 MED ORDER — ROCURONIUM BROMIDE 100 MG/10ML IV SOLN
INTRAVENOUS | Status: DC | PRN
  Administered 2018-03-16: 20 mg via INTRAVENOUS
  Administered 2018-03-16 (×5): 10 mg via INTRAVENOUS

## 2018-03-16 MED ORDER — CEFAZOLIN SODIUM-DEXTROSE 2-3 GM-%(50ML) IV SOLR
INTRAVENOUS | Status: DC | PRN
  Administered 2018-03-16: 2 g via INTRAVENOUS

## 2018-03-16 MED ORDER — FENTANYL CITRATE (PF) 100 MCG/2ML IJ SOLN
INTRAMUSCULAR | Status: AC
  Filled 2018-03-16: qty 4

## 2018-03-16 MED ORDER — BUPIVACAINE HCL (PF) 0.5 % IJ SOLN
INTRAMUSCULAR | Status: AC
  Filled 2018-03-16: qty 30

## 2018-03-16 MED ORDER — LIDOCAINE HCL (PF) 1 % IJ SOLN
INTRAMUSCULAR | Status: AC
  Filled 2018-03-16: qty 5

## 2018-03-16 MED ORDER — MEPERIDINE HCL 50 MG/ML IJ SOLN: 6.2500 mg | INTRAMUSCULAR | Status: DC | PRN

## 2018-03-16 MED ORDER — BUPIVACAINE HCL (PF) 0.5 % IJ SOLN
INTRAMUSCULAR | Status: DC | PRN
  Administered 2018-03-16: 16 mL

## 2018-03-16 MED ORDER — LACTATED RINGERS IV SOLN
INTRAVENOUS | Status: DC | PRN
  Administered 2018-03-16 (×2): via INTRAVENOUS

## 2018-03-16 MED ORDER — ARTIFICIAL TEARS OPHTHALMIC OINT
TOPICAL_OINTMENT | OPHTHALMIC | Status: AC
  Filled 2018-03-16: qty 3.5

## 2018-03-16 SURGICAL SUPPLY — 55 items
BAG RETRIEVAL 10MM (BASKET) ×2
BANDAGE STRIP 1X3 FLEXIBLE (GAUZE/BANDAGES/DRESSINGS) ×8 IMPLANT
BLADE SURG SZ11 CARB STEEL (BLADE) ×4 IMPLANT
CLOSURE WOUND 1/4 X3 (GAUZE/BANDAGES/DRESSINGS) ×1
CLOTH BEACON ORANGE TIMEOUT ST (SAFETY) ×4 IMPLANT
COVER LIGHT HANDLE STERIS (MISCELLANEOUS) ×8 IMPLANT
DECANTER SPIKE VIAL GLASS SM (MISCELLANEOUS) ×4 IMPLANT
DISSECTOR BLUNT TIP ENDO 5MM (MISCELLANEOUS) ×2 IMPLANT
DURAPREP 26ML APPLICATOR (WOUND CARE) ×4 IMPLANT
ELECT REM PT RETURN 9FT ADLT (ELECTROSURGICAL) ×4
ELECTRODE REM PT RTRN 9FT ADLT (ELECTROSURGICAL) ×2 IMPLANT
FILTER SMOKE EVAC LAPAROSHD (FILTER) ×4 IMPLANT
GAUZE SPONGE 4X4 12PLY STRL (GAUZE/BANDAGES/DRESSINGS) ×4 IMPLANT
GLOVE BIO SURGEON STRL SZ 6.5 (GLOVE) ×1 IMPLANT
GLOVE BIO SURGEONS STRL SZ 6.5 (GLOVE) ×1
GLOVE BIOGEL PI IND STRL 6.5 (GLOVE) IMPLANT
GLOVE BIOGEL PI IND STRL 7.0 (GLOVE) ×2 IMPLANT
GLOVE BIOGEL PI IND STRL 9 (GLOVE) ×2 IMPLANT
GLOVE BIOGEL PI INDICATOR 6.5 (GLOVE) ×2
GLOVE BIOGEL PI INDICATOR 7.0 (GLOVE) ×8
GLOVE BIOGEL PI INDICATOR 9 (GLOVE) ×2
GLOVE ECLIPSE 6.5 STRL STRAW (GLOVE) ×4 IMPLANT
GLOVE ECLIPSE 9.0 STRL (GLOVE) ×8 IMPLANT
GOWN SPEC L3 XXLG W/TWL (GOWN DISPOSABLE) ×4 IMPLANT
GOWN STRL REUS W/TWL LRG LVL3 (GOWN DISPOSABLE) ×4 IMPLANT
GOWN STRL REUS W/TWL XL LVL3 (GOWN DISPOSABLE) ×2 IMPLANT
INST SET LAPROSCOPIC GYN AP (KITS) ×4 IMPLANT
IV NS IRRIG 3000ML ARTHROMATIC (IV SOLUTION) ×2 IMPLANT
KIT TURNOVER KIT A (KITS) ×4 IMPLANT
MANIFOLD NEPTUNE II (INSTRUMENTS) ×4 IMPLANT
NDL HYPO 25X1 1.5 SAFETY (NEEDLE) ×2 IMPLANT
NDL INSUFFLATION 14GA 120MM (NEEDLE) ×2 IMPLANT
NEEDLE HYPO 25X1 1.5 SAFETY (NEEDLE) ×4 IMPLANT
NEEDLE INSUFFLATION 14GA 120MM (NEEDLE) ×4 IMPLANT
NS IRRIG 1000ML POUR BTL (IV SOLUTION) ×4 IMPLANT
PACK PERI GYN (CUSTOM PROCEDURE TRAY) ×4 IMPLANT
PAD ARMBOARD 7.5X6 YLW CONV (MISCELLANEOUS) ×4 IMPLANT
SET BASIN LINEN APH (SET/KITS/TRAYS/PACK) ×4 IMPLANT
SET TUBE IRRIG SUCTION NO TIP (IRRIGATION / IRRIGATOR) ×2 IMPLANT
SHEARS HARMONIC ACE PLUS 36CM (ENDOMECHANICALS) ×4 IMPLANT
SLEEVE ENDOPATH XCEL 5M (ENDOMECHANICALS) ×4 IMPLANT
SOLUTION ANTI FOG 6CC (MISCELLANEOUS) ×4 IMPLANT
STRIP CLOSURE SKIN 1/4X3 (GAUZE/BANDAGES/DRESSINGS) ×3 IMPLANT
SUT VIC AB 4-0 PS2 27 (SUTURE) ×6 IMPLANT
SUT VICRYL 0 UR6 27IN ABS (SUTURE) ×4 IMPLANT
SYR 10ML LL (SYRINGE) ×8 IMPLANT
SYR BULB IRRIGATION 50ML (SYRINGE) ×6 IMPLANT
SYS BAG RETRIEVAL 10MM (BASKET) ×6
SYSTEM BAG RETRIEVAL 10MM (BASKET) ×2 IMPLANT
TRAY FOLEY BAG SILVER LF 16FR (CATHETERS) ×2 IMPLANT
TROCAR ENDO BLADELESS 11MM (ENDOMECHANICALS) ×4 IMPLANT
TROCAR ENDO BLADELESS 12MM (ENDOMECHANICALS) ×4 IMPLANT
TROCAR XCEL NON-BLD 5MMX100MML (ENDOMECHANICALS) ×4 IMPLANT
TUBING INSUFFLATION (TUBING) ×4 IMPLANT
WARMER LAPAROSCOPE (MISCELLANEOUS) ×4 IMPLANT

## 2018-03-16 NOTE — Progress Notes (Signed)
Recently transported from pacu on bed.  Drowsy but oriented x 4 and able to converse.  Five post-op bandaid sites to abd dry and intact.  Right arm elevated on pillow due to recent mastectomy.  Requested pain medication and received scheduled toradol.  Husband at bedside.  Vitals stable

## 2018-03-16 NOTE — Op Note (Signed)
03/16/2018  11:12 AM  PATIENT:  Angel Hale  47 y.o. female  PRE-OPERATIVE DIAGNOSIS:  Breast Cancer, indicated salpingo-oophorectomy for hormonal suppression  POST-OPERATIVE DIAGNOSIS:  Breast Cancer, indicated salpingo-oophorectomy for hormonal suppression, pelvic adhesions right adnexa adhesions  PROCEDURE:  Procedure(s): LAPAROSCOPIC BILATERAL SALPINGO OOPHORECTOMY (Bilateral) LAPAROSCOPIC LYSIS OF ADHESIONS (N/A) Dr. Constance Haw  SURGEON:  Surgeon(s) and Role:    * Jonnie Kind, MD - Primary    * Virl Cagey, MD - Assisting consultant intraoperative  PHYSICIAN ASSISTANT:   ASSISTANTS: Zoila Shutter, CST  ANESTHESIA:   local and general  EBL:  15 mL   BLOOD ADMINISTERED:none  DRAINS: Urinary Catheter (Foley)   LOCAL MEDICATIONS USED:  MARCAINE    and Amount: 20 ml  SPECIMEN:  Source of Specimen:  Bilateral tubes and ovaries to pathology  DISPOSITION OF SPECIMEN:  PATHOLOGY  COUNTS:  YES  TOURNIQUET:  * No tourniquets in log *  DICTATION: .Dragon Dictation  PLAN OF CARE: Admit for overnight observation  PATIENT DISPOSITION:  PACU - hemodynamically stable.   Delay start of Pharmacological VTE agent (>24hrs) due to surgical blood loss or risk of bleeding: not applicable Details of procedure: Patient was taken the operating room prepped and draped for combined abdominal and vaginal procedure with Foley catheter in place, sponge stick with gauze in the vagina, legs in yellowfin support with timeout conducted and procedure confirmed by surgical team.  Infra umbilical vertical 1 cm skin incision was made as well as a 2 cm transverse incision in the midline at the site of her prior cyst cystectomy scar, and right lower quadrant and left lower quadrant trochars placed.  Water droplet technique was first used through the umbilicus after placement of the Veress needle being careful oriented needle toward the pelvis.  Once the pneumoperitoneum was achieved under 3 L  CO2 at 6 mm intra-abdominal opening pressure, we were able to place although the trochars under direct visualization.  Attention was then directed the pelvis.  There was essentially no adhesions and no evidence of intraoperative difficulties associated with peritoneal cavity entry upon initiation of the case.  With the patient in Trendelenburg position we were able to start by identifying the left tube and ovary.  The left ureter could be visualized well away from the left IP ligament.  The tube and ovary could be taken down staying close to the tube and well away from the pelvic sidewall we were able to use the harmonic a 7 to amputate the tube and ovary off of the adnexa.  Round ligament was left intact.  As stated earlier the ureter was well out of the way, low on the pelvic sidewall.  Hemostasis was excellent.  The opposite side was then inspected.  It was found that there was extensive adhesions on the right side with the bowel, the sigmoid colon flopped over and attached to the back of the right adnexa photos were taken to document the findings With the uterus on countertraction thin adhesions on the medial aspects of the bowel were able to be sharply taken down there was an area of dense adhesions just at the junction of the fallopian tube and the bowel which required several efforts to identify acceptable cleavage planes.  Dr. Constance Haw was consulted to assist with this as well to be sure we avoided bowel.  Upon her arrival she assumed responsibility for continuing with the continued with the final bits of dissection.  We see her notes for details regarding this  lysis of adhesions she placed 1/5 laparoscopic trocar site in the left lower quadrant to assist with surgery traction and countertraction as necessary Photos 3 and 4 show the dissection of this knuckle of sigmoid from the back of the cornual area of the fallopian tubes and uterus.  Upon completion of this dissection we proceeded with the right  salpingo-oophorectomy.  The ureter could be seen at the pelvic brim close to the origin of the right IP ligament so a retroperitoneal approach to the dissection was felt indicated.  Lateral window was made lateral to the right IP ligament and dissected down carefully bluntly with occasional use of harmonic a 7 for the loose areolar tissues that were encountered.  Upon adequate dissection we can see the ureter which had fallen posteriorly and well out of the area of the surgical field.  Then the right IP ligament could be crossclamped and transected with the harmonic a 7 and then the tube and ovary dissected off of the right adnexa.  The ovary was somewhat adherent to the side of the uterus but we are able to take off the tube and ovary without any bleeding from the uterus.  Pelvis was irrigated copiously.  The specimens were removed using Endo Catch bags  Again Dr. Constance Haw assumed responsibility for testing for bowel patency.  She went down below, and insufflated the bowel using a 60 cc irrigation syringe filled with air.  I laparoscopic bowel clamp was placed well above the area of concern and then with the sigmoid filled with air in the pelvis filled with fluid we proved that there were no identifiable leaking sites.  Bowel integrity was considered confirmed.  Please see her notes for details  The pelvis was then again irrigated excess fluid extracted some fluid left and assist with deflation of the abdomen, and then laparoscopic trochars removed.  Suprapubic and umbilical sites were closed at the fascial level with 0 Vicryl, then all 5 trocar sites closed subcuticular with 4-0 Vicryl and then Steri-Strips and benzoin applied with Band-Aids applied Sponge and needle counts were correct.  Patient will be observed overnight for pain management

## 2018-03-16 NOTE — Discharge Instructions (Signed)
Bilateral Salpingo-Oophorectomy, Care After °This sheet gives you information about how to care for yourself after your procedure. Your health care provider may also give you more specific instructions. If you have problems or questions, contact your health care provider. °What can I expect after the procedure? °After the procedure, it is common to have: °· Abdominal pain. °· Some occasional vaginal bleeding (spotting). °· Tiredness. °· Symptoms of menopause, such as hot flashes, night sweats, or mood swings. ° °Follow these instructions at home: °Incision care °· Keep your incision area and your bandage (dressing) clean and dry. °· Follow instructions from your health care provider about how to take care of your incision. Make sure you: °? Wash your hands with soap and water before you change your dressing. If soap and water are not available, use hand sanitizer. °? Change your dressing as told by your health care provider. °? Leave stitches (sutures), staples, skin glue, or adhesive strips in place. These skin closures may need to stay in place for 2 weeks or longer. If adhesive strip edges start to loosen and curl up, you may trim the loose edges. Do not remove adhesive strips completely unless your health care provider tells you to do that. °· Check your incision area every day for signs of infection. Check for: °? Redness, swelling, or pain. °? Fluid or blood. °? Warmth. °? Pus or a bad smell. °Activity °· Do not drive or use heavy machinery while taking prescription pain medicine. °· Do not drive for 24 hours if you received a medicine to help you relax (sedative) during your procedure. °· Take frequent, short walks throughout the day. Rest when you get tired. Ask your health care provider what activities are safe for you. °· Avoid activity that requires great effort. Also, avoid heavy lifting. Do not lift anything that is heavier than 10 lbs. (4.5 kg), or the limit that your health care provider tells you,  until he or she says that it is safe to do so. °· Do not douche, use tampons, or have sex until your health care provider approves. °General instructions °· To prevent or treat constipation while you are taking prescription pain medicine, your health care provider may recommend that you: °? Drink enough fluid to keep your urine clear or pale yellow. °? Take over-the-counter or prescription medicines. °? Eat foods that are high in fiber, such as fresh fruits and vegetables, whole grains, and beans. °? Limit foods that are high in fat and processed sugars, such as fried and sweet foods. °· Take over-the-counter and prescription medicines only as told by your health care provider. °· Do not take baths, swim, or use a hot tub until your health care provider approves. Ask your health care provider if you can take showers. You may only be allowed to take sponge baths for bathing. °· Wear compression stockings as told by your health care provider. These stockings help to prevent blood clots and reduce swelling in your legs. °· Keep all follow-up visits as told by your health care provider. This is important. °Contact a health care provider if: °· You have pain when you urinate. °· You have pus or a bad smelling discharge coming from your vagina. °· You have redness, swelling, or pain around your incision. °· You have fluid or blood coming from your incision. °· Your incision feels warm to the touch. °· You have pus or a bad smell coming from your incision. °· You have a fever. °· Your incision   starts to break open. °· You have pain in the abdomen, and it gets worse or does not get better when you take medicine. °· You develop a rash. °· You develop nausea and vomiting. °· You feel lightheaded. °Get help right away if: °· You develop pain in your chest or leg. °· You become short of breath. °· You faint. °· You have increased bleeding from your vagina. °Summary °· After the procedure, it is common to have pain, bleeding in  the vagina, and symptoms of menopause. °· Follow instructions from your health care provider about how to take care of your incision. °· Follow instructions from your health care provider about activities and restrictions. °· Check your incision every day for signs of infection and report any symptoms to your health care provider. °This information is not intended to replace advice given to you by your health care provider. Make sure you discuss any questions you have with your health care provider. °Document Released: 08/11/2005 Document Revised: 09/15/2016 Document Reviewed: 09/15/2016 °Elsevier Interactive Patient Education © 2018 [employer] ° °

## 2018-03-16 NOTE — H&P (Signed)
Angel Hale is an 47 y.o. female. She is admitted for laparoscopic Bilateral salping oophorectomy to induce postmenopausal status to allow optimization of chemotherapy plans without chronic ovarian suppression.  The patient specifically declined offered hysterectomy as part of the procedure, preferring minimal surgical intervention,  The patient anticipates going home after procedure, unless pain management requires overnight observation. Risks of injury to adjacent organs , bleeding or infection reviewed with the patient. The patient did not have bowel prep, has had no prior abdominal surgeries.   Pertinent Gynecological History: Menses: supressed with D[place]wyn agonists Bleeding:  Contraception: suppressed  DES exposure: unknown Blood transfusions: none Sexually transmitted diseases: no past history Previous GYN Procedures:  LEFT OVARIAN CYTSTECTOMY Last mammogram: breast cancer undergoing chemotx Date:  Last pap: normal Date: 01/2018 OB History: G, P   Menstrual History: Menarche age:  Patient's last menstrual period was 06/16/2017.    Past Medical History:  Diagnosis Date  . Cancer [facility])    right breast cancer  . History of radiation therapy 12/03/17- 01/13/18   Right Chest wall and supraclavicular region treated to 50 Gy with 25 fx of 2 Gy followed by a boost of 10 Gy in 5 fx.     Past Surgical History:  Procedure Laterality Date  . BUNIONECTOMY Left 2000  . EXTERNAL FIXATION LEG Left 02/16/2017   Procedure: EXTERNAL FIXATION LEG;  Surgeon: Mcarthur Rossetti, MD;  Location: [place];  Service: Orthopedics;  Laterality: Left;  . HARDWARE REMOVAL Left 05/21/2017   Procedure: REMOVAL OF RETAINED PROMINENT SCREW LEFT ANKLE;  Surgeon: Mcarthur Rossetti, MD;  Location: [place];  Service: Orthopedics;  Laterality: Left;  Marland Kitchen MASTECTOMY MODIFIED RADICAL Right 10/14/2017   Procedure: MASTECTOMY MODIFIED RADICAL;  Surgeon: Virl Cagey, MD;  Location: [facility];  Service:  General;  Laterality: Right;  . ORIF ANKLE FRACTURE Left 03/03/2017  . ORIF ANKLE FRACTURE Left 03/03/2017   Procedure: OPEN REDUCTION INTERNAL FIXATION (ORIF) LEFT PILON FRACTURE AND LEFT LATERAL MALLEOLUS;  Surgeon: Mcarthur Rossetti, MD;  Location: [place];  Service: Orthopedics;  Laterality: Left;  . OVARIAN CYST REMOVAL Left 1991  . PORTACATH PLACEMENT    . PORTACATH PLACEMENT Left 05/20/2017   Procedure: INSERTION PORT-A-CATH;  Surgeon: Virl Cagey, MD;  Location: [facility];  Service: General;  Laterality: Left;  . WISDOM TOOTH EXTRACTION      Family History  Problem Relation Age of Onset  . Colon cancer Father     Social History:  reports that she quit smoking about 12 months ago. Her smoking use included cigarettes. She has a 10.00 pack-year smoking history. She has never used smokeless tobacco. She reports that she drinks alcohol. She reports that she does not use drugs.  Allergies:  Allergies  Allergen Reactions  . Penicillins Nausea And Vomiting    Has patient had a PCN reaction causing immediate rash, facial/tongue/throat swelling, SOB or lightheadedness with hypotension: No Has patient had a PCN reaction causing severe rash involving mucus membranes or skin necrosis: No Has patient had a PCN reaction that required hospitalization: No Has patient had a PCN reaction occurring within the last 10 years: No If all of the above answers are "NO", then may proceed with Cephalosporin use.     Medications Prior to Admission  Medication Sig Dispense Refill Last Dose  . Pertuzumab (PERJETA IV) Inject 1 Dose into the vein every 21 ( twenty-one) days. Every 3 weeks    03/03/2018 at Unknown time  . traMADol Veatrice Bourbon)  50 MG tablet Take 1 tablet (50 mg total) by mouth every 6 (six) hours as needed for severe pain. 60 tablet 0 03/14/2018 at Unknown time  . Trastuzumab (HERCEPTIN IV) Inject 1 Dose into the vein every 21 ( twenty-one) days. Every 3 weeks    03/03/2018  .  lidocaine-prilocaine (EMLA) cream Apply to affected area once (Patient taking differently: Apply 1 application topically daily as needed (for port access (every 3 weeks)). Apply to affected area once) 30 g 3 Taking    ROS  Last menstrual period 06/16/2017. Physical Exam  Constitutional: She is oriented to person, place, and time. She appears well-developed and well-nourished.  HENT:  Head: Normocephalic and atraumatic.  Eyes: Pupils are equal, round, and reactive to light.  Neck: Normal range of motion.  Cardiovascular: Normal rate.  Respiratory: Effort normal.  LEFT PORTACATH  GI: Soft. She exhibits no distension and no mass. There is no tenderness. There is no rebound and no guarding.  Genitourinary: Vagina normal and uterus normal.  Musculoskeletal: Normal range of motion.  Neurological: She is alert and oriented to person, place, and time.  Skin: Skin is warm and dry.  Psychiatric: She has a normal mood and affect. Her behavior is normal. Judgment and thought content normal.    No results found for this or any previous visit (from the past 24 hour(s)).  No results found. CBC Latest Ref Rng & Units 03/11/2018 02/10/2018 12/30/2017  WBC 4.0 - 10.5 K/uL 5.1 4.1 5.5  Hemoglobin 12.0 - 15.0 g/dL 14.3 12.7 13.2  Hematocrit 36.0 - 46.0 % 42.8 38.1 39.6  Platelets 150 - 400 K/uL 259 243 265   BMP Latest Ref Rng & Units 03/11/2018 02/10/2018 12/30/2017  Glucose 70 - 99 mg/dL 85 85 111(H)  BUN 6 - 20 mg/dL 10 10 10   Creatinine 0.44 - 1.00 mg/dL 0.77 0.68 0.63  Sodium 135 - 145 mmol/L 142 144 138  Potassium 3.5 - 5.1 mmol/L 3.9 3.9 3.7  Chloride 98 - 111 mmol/L 104 107 101  CO2 22 - 32 mmol/L 31 29 29   Calcium 8.9 - 10.3 mg/dL 9.8 9.6 9.4    Assessment/Plan: 1 RIGHT BREAST CANCER IN PREMENOPAUSAL FEMALE 2 PLANNED BILATERAL SALPINGO OOPHORECTOMY .  Angel Hale 03/16/2018, 6:47 AM

## 2018-03-16 NOTE — Op Note (Signed)
[facility] Operative Note/ Intraoperative Consult   03/16/18  Intraoperative Diagnosis:  Sigmoid adhesions to uterus    Postoperative Diagnosis: Same   Procedure(s) Performed: Lysis of adhesions between the sigmoid and uterus, 20 minutes   Surgeon: Lanell Matar. Constance Haw, MD   Primary Procedure Surgeon: Mallory Shirk, MD (Salpingoopherectomy -separate documentation)   Anesthesia: General endotracheal   Anesthesiologist: Nicanor Alcon, MD   Estimated Blood Loss: Minimal   Blood Replacement: None    Operative Indications:  Ms. Kocian is well known to me given her history of triple negative breast cancer s/p neoadjuvant chemoradiation and subsequent modified radical mastectomy on the right breast.  Given her triple negative status, she was recommended to undergo oophorectomy for ovarian suppression.  Dr. Glo Herring took her back this morning, and noted adhesions of the sigmoid colon to the right side of the uterus and right ovary.  Given the extent of the adhesions and the location, he asked me to come in to help with adhesiolysis.    Findings: Adhesions between the sigmoid colon and right side of the uterus/ tube and right ovary   Procedure: The patient was already in the operating room when I was called into assist with adhesions.  She was in the lithotomy position and there was 4 trocars in place (see Dr. Johnnye Sima documentation).  Dr. Glo Herring had completed his left salpingoopherectomy and had noted adhesions of the sigmoid to the right side of the uterus and right ovary.  She was thought to have had a prior left cystectomy but given the adhesions on the right, Dr. Glo Herring presumed this was actually on the right.  Dr. Glo Herring had been able to take down some of the adhesions between the uterus and the sigmoid creating a window.  An additional 5 mm trocar was placed in the LLQ to aid with retraction of the bowel and Dr. Glo Herring assisted with retraction of the uterus.   With a combination of sharp dissection with the scissors and blunt dissection a plane between the uterus and sigmoid colon was created which was taken safely with the Harmonic energy device.  With continued careful sharp dissection the sigmoid was freed from the uterus and ovary, leaving some of the tubal tissue adherent to the sigmoid colon given the proximity.  There was no signs of any injury to the sigmoid colon.  Hemostasis was confirmed.  I then proceeded to assist Dr. Glo Herring for the remaining portion right sided salpingoopherectomy (see his documentation).    Prior to the conclusion of the case, the pelvis was filled with fluid and the proximal sigmoid colon was grasped across the lumen.  I went below and insufflated air into the anus, and the rectum and sigmoid colon insufflated without signs of any leakage of air or bubbles noted.    Final inspection revealed acceptable hemostasis. All counts were correct at the end of the case. Dr. Glo Herring closed the port sites (see his documentation).  The patient was awakened from anesthesia and extubate without complication.  The patient went to the PACU in stable condition.   Curlene Labrum, MD [facility] [address] [place], [place] [address] [phone] (office)

## 2018-03-16 NOTE — Anesthesia Preprocedure Evaluation (Signed)
Anesthesia Evaluation  Patient identified by MRN, date of birth, ID band Patient awake    Reviewed: Allergy & Precautions, H&P , NPO status , Patient's Chart, lab work & pertinent test results, reviewed documented beta blocker date and time   Airway Mallampati: I  TM Distance: >3 FB Neck ROM: full    Dental no notable dental hx. (+) Teeth Intact   Pulmonary neg pulmonary ROS, former smoker,    Pulmonary exam normal breath sounds clear to auscultation       Cardiovascular Exercise Tolerance: Good negative cardio ROS   Rhythm:regular Rate:Normal     Neuro/Psych negative neurological ROS  negative psych ROS   GI/Hepatic negative GI ROS, Neg liver ROS,   Endo/Other  negative endocrine ROS  Renal/GU negative Renal ROS  negative genitourinary   Musculoskeletal   Abdominal   Peds  Hematology negative hematology ROS (+)   Anesthesia Other Findings Breast Cancer  Reproductive/Obstetrics negative OB ROS                             Anesthesia Physical Anesthesia Plan  ASA: III  Anesthesia Plan: General   Post-op Pain Management:    Induction:   PONV Risk Score and Plan:   Airway Management Planned:   Additional Equipment:   Intra-op Plan:   Post-operative Plan:   Informed Consent: I have reviewed the patients History and Physical, chart, labs and discussed the procedure including the risks, benefits and alternatives for the proposed anesthesia with the patient or authorized representative who has indicated his/her understanding and acceptance.   Dental Advisory Given  Plan Discussed with: CRNA  Anesthesia Plan Comments:         Anesthesia Quick Evaluation

## 2018-03-16 NOTE — Progress Notes (Signed)
[facility]  Checked on patient. Doing well. No complaints. Pain controlled. Tolerating liquids.   Curlene Labrum, MD [facility] [address] [place], [place] [address] [phone] (office)

## 2018-03-16 NOTE — Transfer of Care (Signed)
Immediate Anesthesia Transfer of Care Note  Patient: Angel Hale  Procedure(s) Performed: LAPAROSCOPIC BILATERAL SALPINGO OOPHORECTOMY (Bilateral Abdomen) LAPAROSCOPIC LYSIS OF ADHESIONS (N/A Abdomen)  Patient Location: PACU  Anesthesia Type:General  Level of Consciousness: awake, oriented and patient cooperative  Airway & Oxygen Therapy: Patient Spontanous Breathing  Post-op Assessment: Report given to RN and Post -op Vital signs reviewed and stable  Post vital signs: Reviewed and stable  Last Vitals:  Vitals Value Taken Time  BP    Temp    Pulse 95 03/16/2018 10:55 AM  Resp    SpO2 93 % 03/16/2018 10:55 AM  Vitals shown include unvalidated device data.  Last Pain:  Vitals:   03/16/18 0705  TempSrc: Oral  PainSc: 0-No pain         Complications: No apparent anesthesia complications

## 2018-03-16 NOTE — Anesthesia Procedure Notes (Signed)
Procedure Name: Intubation Date/Time: 03/16/2018 7:34 AM Performed by: Andree Elk, Langley Flatley A, CRNA Pre-anesthesia Checklist: Patient identified, Patient being monitored, Timeout performed, Emergency Drugs available and Suction available Patient Re-evaluated:Patient Re-evaluated prior to induction Oxygen Delivery Method: Circle System Utilized Preoxygenation: Pre-oxygenation with 100% oxygen Induction Type: IV induction Ventilation: Mask ventilation without difficulty Laryngoscope Size: Mac and 3 Grade View: Grade I Tube type: Oral Tube size: 7.0 mm Number of attempts: 1 Airway Equipment and Method: Stylet Placement Confirmation: ETT inserted through vocal cords under direct vision,  positive ETCO2 and breath sounds checked- equal and bilateral Secured at: 21 cm Tube secured with: Tape Dental Injury: Teeth and Oropharynx as per pre-operative assessment

## 2018-03-16 NOTE — Anesthesia Postprocedure Evaluation (Signed)
Anesthesia Post Note  Patient: Angel Hale  Procedure(s) Performed: LAPAROSCOPIC BILATERAL SALPINGO OOPHORECTOMY (Bilateral Abdomen) LAPAROSCOPIC LYSIS OF ADHESIONS (N/A Abdomen)  Patient location during evaluation: PACU Anesthesia Type: General Level of consciousness: awake and alert, oriented and patient cooperative Pain management: pain level controlled Vital Signs Assessment: post-procedure vital signs reviewed and stable Respiratory status: spontaneous breathing and respiratory function stable Cardiovascular status: stable Postop Assessment: no apparent nausea or vomiting Anesthetic complications: no     Last Vitals:  Vitals:   03/16/18 1127 03/16/18 1130  BP:  101/70  Pulse: 73 69  Resp: 15 (!) 22  Temp:    SpO2: 100% 100%    Last Pain:  Vitals:   03/16/18 1145  TempSrc:   PainSc: 5                  Janet Decesare A

## 2018-03-17 ENCOUNTER — Telehealth (HOSPITAL_COMMUNITY): Payer: Self-pay | Admitting: Hematology

## 2018-03-17 LAB — CBC
HCT: 33.4 % — ABNORMAL LOW (ref 36.0–46.0)
HEMOGLOBIN: 11 g/dL — AB (ref 12.0–15.0)
MCH: 31.7 pg (ref 26.0–34.0)
MCHC: 32.9 g/dL (ref 30.0–36.0)
MCV: 96.3 fL (ref 78.0–100.0)
PLATELETS: 199 10*3/uL (ref 150–400)
RBC: 3.47 MIL/uL — AB (ref 3.87–5.11)
RDW: 14.7 % (ref 11.5–15.5)
WBC: 5.5 10*3/uL (ref 4.0–10.5)

## 2018-03-17 LAB — BASIC METABOLIC PANEL
ANION GAP: 5 (ref 5–15)
BUN: 8 mg/dL (ref 6–20)
CALCIUM: 8.4 mg/dL — AB (ref 8.9–10.3)
CO2: 26 mmol/L (ref 22–32)
Chloride: 110 mmol/L (ref 98–111)
Creatinine, Ser: 0.61 mg/dL (ref 0.44–1.00)
GFR calc Af Amer: >60 mL/min (ref >60)
GLUCOSE: 93 mg/dL (ref 70–99)
Potassium: 3.3 mmol/L — ABNORMAL LOW (ref 3.5–5.1)
SODIUM: 141 mmol/L (ref 135–145)

## 2018-03-17 MED ORDER — TRAMADOL HCL 50 MG PO TABS: 50.0000 mg | ORAL_TABLET | Freq: Four times a day (QID) | ORAL | 0 refills | Status: DC | PRN

## 2018-03-17 MED ORDER — VENLAFAXINE HCL ER 37.5 MG PO CP24: 37.5000 mg | ORAL_CAPSULE | Freq: Every day | ORAL | 5 refills | Status: DC

## 2018-03-17 NOTE — Progress Notes (Signed)
1 Day Post-Op Procedure(s) (LRB): LAPAROSCOPIC BILATERAL SALPINGO OOPHORECTOMY (Bilateral) LAPAROSCOPIC LYSIS OF ADHESIONS (N/A)  Subjective: Patient reports incisional pain.    Objective: I have reviewed patient's vital signs, medications and labs.  General: alert, cooperative and no distress Resp: clear to auscultation bilaterally GI: soft, non-tender; bowel sounds normal; no masses,  no organomegaly and incision: clean, dry and intact Extremities: Homans sign is negative, no sign of DVT CBC    Component Value Date/Time   WBC 5.5 03/17/2018 0356   RBC 3.47 (L) 03/17/2018 0356   HGB 11.0 (L) 03/17/2018 0356   HCT 33.4 (L) 03/17/2018 0356   PLT 199 03/17/2018 0356   MCV 96.3 03/17/2018 0356   MCH 31.7 03/17/2018 0356   MCHC 32.9 03/17/2018 0356   RDW 14.7 03/17/2018 0356   LYMPHSABS 0.9 02/10/2018 1115   MONOABS 0.3 02/10/2018 1115   EOSABS 0.1 02/10/2018 1115   BASOSABS 0.0 02/10/2018 1115    Assessment: s/p Procedure(s): LAPAROSCOPIC BILATERAL SALPINGO OOPHORECTOMY (Bilateral) LAPAROSCOPIC LYSIS OF ADHESIONS (N/A): stable and tolerating diet  Plan: Advance diet Discontinue IV fluids Discharge home  LOS: 0 days    Jonnie Kind 03/17/2018, 8:45 AM

## 2018-03-17 NOTE — Telephone Encounter (Signed)
Reimbursement req faxed to [facility] for [place] 08/20/17 and 09/10/17 and   [place] for Herceptin and [employer] 08/20/17-03/16/18.

## 2018-03-17 NOTE — Progress Notes (Signed)
Patient discharged with personal belongings. IV removed and site intact. Patient discharged with all prescriptions.

## 2018-03-17 NOTE — Discharge Summary (Signed)
Physician Discharge Summary  Patient ID: Angel Hale MRN: [id number] DOB/AGE: 1971-07-10 47 y.o.  Admit date: 03/16/2018 Discharge date: 03/17/2018  Admission Diagnoses:breast cancer in premenopausal female                               Ovarian removal needed for optimizing chemotherapy  Discharge Diagnoses:  Active Problems:   Breast cancer metastasized to axillary lymph node, right (HCC)   Intra-abdominal adhesions   S/P bilateral salpingo-oophorectomy   Discharged Condition: good  Hospital Course: pt was admitted via Day Surgery for laparoscopic BSO, which was accomplished as described in opnote, with hospitalization overnight for pain management,and sent home the following day.  Consults: None  Significant Diagnostic Studies: labs:  CBC Latest Ref Rng & Units 03/17/2018 03/11/2018 02/10/2018  WBC 4.0 - 10.5 K/uL 5.5 5.1 4.1  Hemoglobin 12.0 - 15.0 g/dL 11.0(L) 14.3 12.7  Hematocrit 36.0 - 46.0 % 33.4(L) 42.8 38.1  Platelets 150 - 400 K/uL 199 259 243     Treatments: surgery: laparoscopic Bilateral salping oophorectomy   Discharge Exam: Blood pressure 108/75, pulse (!) 55, temperature 98.3 F (36.8 C), temperature source Oral, resp. rate 16, height 5\' 5"  (1.651 m), weight 125 lb (56.7 kg), last menstrual period 06/16/2017, SpO2 97 %. General appearance: alert, cooperative, fatigued and no distress Eyes: conjunctivae/corneas clear. PERRL, EOM's intact. Fundi benign. Resp: clear to auscultation bilaterally GI: normal findings: bowel sounds normal and abnormal findings:  steristips in place over incision sites. Extremities: extremities normal, atraumatic, no cyanosis or edema and Homans sign is negative, no sign of DVT  Disposition:    Allergies as of 03/17/2018      Reactions   Penicillins Nausea And Vomiting   Has patient had a PCN reaction causing immediate rash, facial/tongue/throat swelling, SOB or lightheadedness with hypotension: No Has patient had a PCN  reaction causing severe rash involving mucus membranes or skin necrosis: No Has patient had a PCN reaction that required hospitalization: No Has patient had a PCN reaction occurring within the last 10 years: No If all of the above answers are "NO", then may proceed with Cephalosporin use.      Medication List    TAKE these medications   HERCEPTIN IV Inject 1 Dose into the vein every 21 ( twenty-one) days. Every 3 weeks   lidocaine-prilocaine cream Commonly known as:  EMLA Apply to affected area once What changed:    how much to take  how to take this  when to take this  reasons to take this  additional instructions   PERJETA IV Inject 1 Dose into the vein every 21 ( twenty-one) days. Every 3 weeks   traMADol 50 MG tablet Commonly known as:  ULTRAM Take 1 tablet (50 mg total) by mouth every 6 (six) hours as needed for severe pain.   venlafaxine XR 37.5 MG 24 hr capsule Commonly known as:  EFFEXOR XR Take 1 capsule (37.5 mg total) by mouth daily with breakfast.      Follow-up Information    Jonnie Kind, MD Follow up in 2 week(s).   Specialties:  Obstetrics and Gynecology, Radiology Contact information: [place] [address] [phone]           Signed: Jonnie Kind 03/17/2018, 8:50 AM

## 2018-03-18 ENCOUNTER — Encounter (HOSPITAL_COMMUNITY): Payer: Self-pay | Admitting: Obstetrics and Gynecology

## 2018-03-24 ENCOUNTER — Inpatient Hospital Stay (HOSPITAL_COMMUNITY): Payer: Self-pay | Attending: Hematology

## 2018-03-24 ENCOUNTER — Encounter (HOSPITAL_COMMUNITY): Payer: Self-pay | Admitting: Hematology

## 2018-03-24 ENCOUNTER — Inpatient Hospital Stay (HOSPITAL_BASED_OUTPATIENT_CLINIC_OR_DEPARTMENT_OTHER): Payer: Self-pay | Admitting: Hematology

## 2018-03-24 VITALS — BP 107/72 | HR 60 | Temp 98.0°F | Resp 18 | Wt 125.6 lb

## 2018-03-24 VITALS — BP 92/55 | HR 67 | Temp 98.3°F | Resp 18

## 2018-03-24 DIAGNOSIS — C773 Secondary and unspecified malignant neoplasm of axilla and upper limb lymph nodes: Principal | ICD-10-CM

## 2018-03-24 DIAGNOSIS — Z79811 Long term (current) use of aromatase inhibitors: Secondary | ICD-10-CM

## 2018-03-24 DIAGNOSIS — R918 Other nonspecific abnormal finding of lung field: Secondary | ICD-10-CM

## 2018-03-24 DIAGNOSIS — Z9011 Acquired absence of right breast and nipple: Secondary | ICD-10-CM

## 2018-03-24 DIAGNOSIS — Z800 Family history of malignant neoplasm of digestive organs: Secondary | ICD-10-CM

## 2018-03-24 DIAGNOSIS — C50811 Malignant neoplasm of overlapping sites of right female breast: Secondary | ICD-10-CM

## 2018-03-24 DIAGNOSIS — C50911 Malignant neoplasm of unspecified site of right female breast: Secondary | ICD-10-CM

## 2018-03-24 DIAGNOSIS — Z87891 Personal history of nicotine dependence: Secondary | ICD-10-CM

## 2018-03-24 DIAGNOSIS — Z9221 Personal history of antineoplastic chemotherapy: Secondary | ICD-10-CM | POA: Insufficient documentation

## 2018-03-24 DIAGNOSIS — Z79899 Other long term (current) drug therapy: Secondary | ICD-10-CM

## 2018-03-24 DIAGNOSIS — Z170 Estrogen receptor positive status [ER+]: Secondary | ICD-10-CM | POA: Insufficient documentation

## 2018-03-24 DIAGNOSIS — Z923 Personal history of irradiation: Secondary | ICD-10-CM | POA: Insufficient documentation

## 2018-03-24 DIAGNOSIS — F419 Anxiety disorder, unspecified: Secondary | ICD-10-CM

## 2018-03-24 DIAGNOSIS — R232 Flushing: Secondary | ICD-10-CM

## 2018-03-24 LAB — COMPREHENSIVE METABOLIC PANEL
ALT: 14 U/L (ref 0–44)
AST: 16 U/L (ref 15–41)
Albumin: 3.8 g/dL (ref 3.5–5.0)
Alkaline Phosphatase: 76 U/L (ref 38–126)
Anion gap: 8 (ref 5–15)
BUN: 12 mg/dL (ref 6–20)
CHLORIDE: 106 mmol/L (ref 98–111)
CO2: 27 mmol/L (ref 22–32)
CREATININE: 0.72 mg/dL (ref 0.44–1.00)
Calcium: 9.4 mg/dL (ref 8.9–10.3)
GFR calc Af Amer: >60 mL/min (ref >60)
Glucose, Bld: 92 mg/dL (ref 70–99)
Potassium: 3.8 mmol/L (ref 3.5–5.1)
Sodium: 141 mmol/L (ref 135–145)
Total Bilirubin: 0.7 mg/dL (ref 0.3–1.2)
Total Protein: 7 g/dL (ref 6.5–8.1)

## 2018-03-24 LAB — CBC WITH DIFFERENTIAL/PLATELET
Basophils Absolute: 0 10*3/uL (ref 0.0–0.1)
Basophils Relative: 0 %
EOS ABS: 0.1 10*3/uL (ref 0.0–0.7)
EOS PCT: 2 %
HCT: 37.8 % (ref 36.0–46.0)
Hemoglobin: 12.9 g/dL (ref 12.0–15.0)
LYMPHS ABS: 0.9 10*3/uL (ref 0.7–4.0)
LYMPHS PCT: 17 %
MCH: 32.4 pg (ref 26.0–34.0)
MCHC: 34.1 g/dL (ref 30.0–36.0)
MCV: 95 fL (ref 78.0–100.0)
MONO ABS: 0.3 10*3/uL (ref 0.1–1.0)
MONOS PCT: 6 %
Neutro Abs: 3.7 10*3/uL (ref 1.7–7.7)
Neutrophils Relative %: 75 %
PLATELETS: 238 10*3/uL (ref 150–400)
RBC: 3.98 MIL/uL (ref 3.87–5.11)
RDW: 14.1 % (ref 11.5–15.5)
WBC: 5 10*3/uL (ref 4.0–10.5)

## 2018-03-24 MED ORDER — ACETAMINOPHEN 325 MG PO TABS
ORAL_TABLET | ORAL | Status: AC
  Filled 2018-03-24: qty 2

## 2018-03-24 MED ORDER — SODIUM CHLORIDE 0.9 % IV SOLN
Freq: Once | INTRAVENOUS | Status: AC
Start: 2018-03-24 — End: 2018-03-24
  Administered 2018-03-24: 12:00:00 via INTRAVENOUS

## 2018-03-24 MED ORDER — ACETAMINOPHEN 325 MG PO TABS
650.0000 mg | ORAL_TABLET | Freq: Once | ORAL | Status: AC
  Administered 2018-03-24: 650 mg via ORAL

## 2018-03-24 MED ORDER — HEPARIN SOD (PORK) LOCK FLUSH 100 UNIT/ML IV SOLN
500.0000 [IU] | Freq: Once | INTRAVENOUS | Status: AC | PRN
  Administered 2018-03-24: 500 [IU]
  Filled 2018-03-24: qty 5

## 2018-03-24 MED ORDER — DIPHENHYDRAMINE HCL 25 MG PO CAPS
ORAL_CAPSULE | ORAL | Status: AC
  Filled 2018-03-24: qty 2

## 2018-03-24 MED ORDER — OCTREOTIDE ACETATE 30 MG IM KIT
PACK | INTRAMUSCULAR | Status: AC
  Filled 2018-03-24: qty 1

## 2018-03-24 MED ORDER — DIPHENHYDRAMINE HCL 25 MG PO CAPS
25.0000 mg | ORAL_CAPSULE | Freq: Once | ORAL | Status: AC
  Administered 2018-03-24: 25 mg via ORAL

## 2018-03-24 MED ORDER — SODIUM CHLORIDE 0.9 % IV SOLN
420.0000 mg | Freq: Once | INTRAVENOUS | Status: AC
  Administered 2018-03-24: 420 mg via INTRAVENOUS
  Filled 2018-03-24: qty 14

## 2018-03-24 MED ORDER — EXEMESTANE 25 MG PO TABS: 25.0000 mg | ORAL_TABLET | Freq: Every day | ORAL | 4 refills | Status: DC

## 2018-03-24 MED ORDER — SODIUM CHLORIDE 0.9% FLUSH
10.0000 mL | INTRAVENOUS | Status: DC | PRN
  Administered 2018-03-24: 10 mL
  Filled 2018-03-24: qty 10

## 2018-03-24 MED ORDER — TRASTUZUMAB CHEMO 150 MG IV SOLR
6.0000 mg/kg | Freq: Once | INTRAVENOUS | Status: AC
  Administered 2018-03-24: 336 mg via INTRAVENOUS
  Filled 2018-03-24: qty 16

## 2018-03-24 NOTE — Progress Notes (Signed)
[facility] [place], [place] [address]   CLINIC:  Medical Oncology/Hematology  PCP:  Patient, No Pcp Per No address on file None   REASON FOR VISIT:  Follow-up for metastatic right breast cancer, HER2 +  CURRENT THERAPY: Herceptin and pertuzumab  BRIEF ONCOLOGIC HISTORY:    Malignant neoplasm of overlapping sites of right breast in female, estrogen receptor positive ([place])   05/12/2017 Initial Biopsy    (R) breast (LIQ): IDC, grade 3; ER+ (80%), PR+ (10%), HER2+ (ratio 2.56). Ki67 80%.        05/12/2017 Procedure    Additional (R) breast biopsy (UOQ): IDC, grade 3; ER+ (80%), PR+ (50%), HER2+ (ratio 2.77), Ki67 90%.       05/12/2017 Procedure    (R) axillary lymph node biopsy: Metastatic carcinoma; ER+ (80%), PR+ (30%), HER2+ (ratio 2.99), Ki67 80%.       05/12/2017 Imaging    Mammogram/breast US: Targeted ultrasound is performed, showing multiple irregular hypoechoic masses. In the 6 o'clock region of the right breast is a large irregular mass spans at least 4.8 cm. In the 4:00 to 5:00 region of the right breast an irregular mass measures 4.5 cm. In the 930-10 o'clock position of the right breast centered 9-10 cm from the nipple is an irregular 4.0 x 3.8 x 1.9 cm hypoechoic mass. In the 9:30 position of the right breast at the lateral margin is a probable abnormal intramammary lymph node measuring 1.3 cm greatest diameter. In the 10 o'clock position of the right breast far lateral is a hypoechoic dermal nodule that measures 0.5 cm. Diffuse skin thickening of the right breast measures up to 4.5 mm.      05/14/2017 Tumor Marker      Ref. Range 05/14/2017 15:30  CA 15-3 Latest Ref Range: 0.0 - 25.0 U/mL 115.9 (H)     Ref. Range 05/14/2017 15:30  CA 27.29 Latest Ref Range: 0.0 - 38.6 U/mL 157.7 (H)        05/15/2017 Imaging    CT chest/abd/pelvis:  IMPRESSION: 1. Numerous right breast masses and metastatic right axillary lymphadenopathy. Findings  suspicious for chest wall invasion along the lower lateral aspect of the breast. 2. Two small indeterminate right lower lobe pulmonary nodules will require surveillance. 3. No findings for metastatic disease involving the abdomen/pelvis or osseous structures.      05/15/2017 Imaging    Bone scan:  IMPRESSION: No evidence of metastatic disease.      05/18/2017 Echocardiogram    LV EF: 60% -   65%      05/20/2017 Procedure    Port-a-cath placement       05/25/2017 Initial Diagnosis    Malignant neoplasm of overlapping sites of right breast in female, estrogen receptor positive ([place])      05/27/2017 - 09/10/2017 Neo-Adjuvant Chemotherapy    TCHPx 6 cycles      10/14/2017 Surgery    Right modified radical mastectomy and lymph node biopsy on 10/14/2017, with complete pathological response, [id number]        CANCER STAGING: Cancer Staging Malignant neoplasm of overlapping sites of right breast in female, estrogen receptor positive ([place]) Staging form: Breast, AJCC 8th Edition - Clinical stage from 05/25/2017: Stage IIIB (cT4b, cN3a, cM0, G3, ER+, PR+, HER2+) - Signed by Holley Bouche, NP on 07/07/2017  Malignant neoplasm of overlapping sites of right female breast [facility]) Staging form: Breast, AJCC 8th Edition - Clinical: No Stage Recommended (ycT4b, cN3a, cM0, G3, ER: Positive,  PR: Positive, HER2: Positive) - Signed by Twana First, MD on 05/25/2017    INTERVAL HISTORY:  Ms. Florido 47 y.o. female returns for routine follow-up for metastatic breast cancer. Patient is here today with her husband. She is very axnious about her treatment and prognosis. She is remaining active at home. She has had a few hot flashes and her primary care doctor prescribed Effexor for she doesn't want to start the medication at this time and wishes to start something all natural. She is recovering well from her surgery. Denies any nausea, vomiting, or diarrhea. Denies any numbness or tingling in her hand  or feet.    REVIEW OF SYSTEMS:  Review of Systems  Constitutional: Negative.   HENT:  Negative.   Eyes: Negative.   Respiratory: Negative.   Cardiovascular: Negative.   Gastrointestinal: Negative.   Endocrine: Negative.   Genitourinary: Negative.    Musculoskeletal: Negative.   Skin: Negative.   Neurological: Negative.   Hematological: Negative.   Psychiatric/Behavioral: The patient is nervous/anxious.      PAST MEDICAL/SURGICAL HISTORY:  Past Medical History:  Diagnosis Date  . Cancer [facility])    right breast cancer  . History of radiation therapy 12/03/17- 01/13/18   Right Chest wall and supraclavicular region treated to 50 Gy with 25 fx of 2 Gy followed by a boost of 10 Gy in 5 fx.    Past Surgical History:  Procedure Laterality Date  . BUNIONECTOMY Left 2000  . EXTERNAL FIXATION LEG Left 02/16/2017   Procedure: EXTERNAL FIXATION LEG;  Surgeon: Mcarthur Rossetti, MD;  Location: [place] [facility];  Service: Orthopedics;  Laterality: Left;  . HARDWARE REMOVAL Left 05/21/2017   Procedure: REMOVAL OF RETAINED PROMINENT SCREW LEFT ANKLE;  Surgeon: Mcarthur Rossetti, MD;  Location: [place];  Service: Orthopedics;  Laterality: Left;  . LAPAROSCOPIC BILATERAL SALPINGO OOPHERECTOMY Bilateral 03/16/2018   Procedure: LAPAROSCOPIC BILATERAL SALPINGO OOPHORECTOMY;  Surgeon: Jonnie Kind, MD;  Location: [facility];  Service: Gynecology;  Laterality: Bilateral;  . LAPAROSCOPIC LYSIS OF ADHESIONS N/A 03/16/2018   Procedure: LAPAROSCOPIC LYSIS OF ADHESIONS;  Surgeon: Jonnie Kind, MD;  Location: [facility];  Service: Gynecology;  Laterality: N/A;  . MASTECTOMY MODIFIED RADICAL Right 10/14/2017   Procedure: MASTECTOMY MODIFIED RADICAL;  Surgeon: Virl Cagey, MD;  Location: [facility];  Service: General;  Laterality: Right;  . ORIF ANKLE FRACTURE Left 03/03/2017  . ORIF ANKLE FRACTURE Left 03/03/2017   Procedure: OPEN REDUCTION INTERNAL FIXATION (ORIF) LEFT PILON FRACTURE AND LEFT LATERAL  MALLEOLUS;  Surgeon: Mcarthur Rossetti, MD;  Location: [facility];  Service: Orthopedics;  Laterality: Left;  . OVARIAN CYST REMOVAL Left 1991  . PORTACATH PLACEMENT    . PORTACATH PLACEMENT Left 05/20/2017   Procedure: INSERTION PORT-A-CATH;  Surgeon: Virl Cagey, MD;  Location: [facility];  Service: General;  Laterality: Left;  . WISDOM TOOTH EXTRACTION       SOCIAL HISTORY:  Social History   Socioeconomic History  . Marital status: Married    Spouse name: Not on file  . Number of children: Not on file  . Years of education: Not on file  . Highest education level: Not on file  Occupational History  . Not on file  Social Needs  . Financial resource strain: Not on file  . Food insecurity:    Worry: Not on file    Inability: Not on file  . Transportation needs:    Medical: Not on file    Non-medical: Not on  file  Tobacco Use  . Smoking status: Former Smoker    Packs/day: 1.00    Years: 10.00    Pack years: 10.00    Types: Cigarettes    Last attempt to quit: 02/16/2017    Years since quitting: 1.0  . Smokeless tobacco: Never Used  Substance and Sexual Activity  . Alcohol use: Yes    Comment: occasional  . Drug use: No  . Sexual activity: Yes    Birth control/protection: None  Lifestyle  . Physical activity:    Days per week: Not on file    Minutes per session: Not on file  . Stress: Not on file  Relationships  . Social connections:    Talks on phone: Not on file    Gets together: Not on file    Attends religious service: Not on file    Active member of club or organization: Not on file    Attends meetings of clubs or organizations: Not on file    Relationship status: Not on file  . Intimate partner violence:    Fear of current or ex partner: Not on file    Emotionally abused: Not on file    Physically abused: Not on file    Forced sexual activity: Not on file  Other Topics Concern  . Not on file  Social History Narrative  . Not on file    FAMILY  HISTORY:  Family History  Problem Relation Age of Onset  . Colon cancer Father     CURRENT MEDICATIONS:  Outpatient Encounter Medications as of 03/24/2018  Medication Sig  . lidocaine-prilocaine (EMLA) cream Apply to affected area once (Patient taking differently: Apply 1 application topically daily as needed (for port access (every 3 weeks)). Apply to affected area once)  . Pertuzumab (PERJETA IV) Inject 1 Dose into the vein every 21 ( twenty-one) days. Every 3 weeks   . traMADol (ULTRAM) 50 MG tablet Take 1 tablet (50 mg total) by mouth every 6 (six) hours as needed for severe pain.  . traMADol (ULTRAM) 50 MG tablet Take 1 tablet (50 mg total) by mouth every 6 (six) hours as needed for moderate pain or severe pain.  . Trastuzumab (HERCEPTIN IV) Inject 1 Dose into the vein every 21 ( twenty-one) days. Every 3 weeks   . venlafaxine XR (EFFEXOR XR) 37.5 MG 24 hr capsule Take 1 capsule (37.5 mg total) by mouth daily with breakfast.  . exemestane (AROMASIN) 25 MG tablet Take 1 tablet (25 mg total) by mouth daily after breakfast.   No facility-administered encounter medications on file as of 03/24/2018.     ALLERGIES:  Allergies  Allergen Reactions  . Penicillins Nausea And Vomiting    Has patient had a PCN reaction causing immediate rash, facial/tongue/throat swelling, SOB or lightheadedness with hypotension: No Has patient had a PCN reaction causing severe rash involving mucus membranes or skin necrosis: No Has patient had a PCN reaction that required hospitalization: No Has patient had a PCN reaction occurring within the last 10 years: No If all of the above answers are "NO", then may proceed with Cephalosporin use.      PHYSICAL EXAM:  ECOG Performance status: 1  Vitals:   03/24/18 0944  BP: 107/72  Pulse: 60  Resp: 18  Temp: 98 F (36.7 C)  SpO2: 100%   Filed Weights   03/24/18 0944  Weight: 125 lb 9.6 oz (57 kg)    Physical Exam  Constitutional: She is oriented to  person,  place, and time.  Cardiovascular: Normal rate, regular rhythm and normal heart sounds.  Pulmonary/Chest: Effort normal and breath sounds normal.  Neurological: She is alert and oriented to person, place, and time.  Skin: Skin is warm and dry.     LABORATORY DATA:  I have reviewed the labs as listed.  CBC    Component Value Date/Time   WBC 5.0 03/24/2018 1022   RBC 3.98 03/24/2018 1022   HGB 12.9 03/24/2018 1022   HCT 37.8 03/24/2018 1022   PLT 238 03/24/2018 1022   MCV 95.0 03/24/2018 1022   MCH 32.4 03/24/2018 1022   MCHC 34.1 03/24/2018 1022   RDW 14.1 03/24/2018 1022   LYMPHSABS 0.9 03/24/2018 1022   MONOABS 0.3 03/24/2018 1022   EOSABS 0.1 03/24/2018 1022   BASOSABS 0.0 03/24/2018 1022   CMP Latest Ref Rng & Units 03/24/2018 03/17/2018 03/11/2018  Glucose 70 - 99 mg/dL 92 93 85  BUN 6 - 20 mg/dL _0 Creatinine 0.44 - 1.00 mg/dL 0.72 0.61 0.77  Sodium 135 - 145 mmol/L 141 141 142  Potassium 3.5 - 5.1 mmol/L 3.8 3.3(L) 3.9  Chloride 98 - 111 mmol/L 106 110 104  CO2 22 - 32 mmol/L _1 Calcium 8.9 - 10.3 mg/dL 9.4 8.4(L) 9.8  Total Protein 6.5 - 8.1 g/dL 7.0 - 7.8  Total Bilirubin 0.3 - 1.2 mg/dL 0.7 - 0.7  Alkaline Phos 38 - 126 U/L 76 - 97  AST 15 - 41 U/L 16 - 17  ALT 0 - 44 U/L 14 - 14         ASSESSMENT & PLAN:   Malignant neoplasm of overlapping sites of right female breast ([place]) 1.  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 -As she has a high risk disease (based on SOFT and TEXT trails), I have recommended ovarian ablation with the aromatase inhibitor therapy (preferably exemestane). Patient is pre-menopausal.  I have recommended surgical nephrectomy. -Bilateral oophorectomy done on 03/16/2018 -We talked about initiating her on  aromatase inhibitor, exemestane 25 mg daily for at least 5 years and possibly longer.  We talked about side effects in detail including but not limited to hot flashes, muscular skeletal symptoms, decreased bone mineral density among others.  I have sent a prescription to her pharmacy.  Should she get hot flashes, she would like to try over-the-counter phytoestrogens. -I have suggested her to take calcium and vitamin D supplements twice daily.  I have also recommended weightbearing exercises. -She will proceed with Herceptin and Pertuzumab today.  I have reviewed her echocardiogram dated 02/22/2018 which shows ejection fraction of 60 to 65%.  2.  High risk drug monitoring for Herceptin: She does not have any clinical signs or symptoms of CHF.  Echocardiogram on 02/22/2018 reviewed by me showed ejection fraction of 60 to 65%.  3.  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  She does have on and off numbness in the fingertips.  Even though she was prescribed gabapentin 100 mg 3 times daily, she never took it because of concern for side effects.  I will see her back in 6 weeks for follow-up.    Orders placed this encounter:  Orders Placed This Encounter  Procedures  . CBC with Differential/Platelet  . Comprehensive metabolic panel  . CBC with  Differential/Platelet  . Comprehensive metabolic panel      Derek Jack, MD [place] [phone]

## 2018-03-24 NOTE — Patient Instructions (Signed)
[facility] Discharge Instructions for Patients Receiving Chemotherapy   Beginning January 23rd 2017 lab work for the Cancer Center will be done in the  Main lab at  on 1st floor. If you have a lab appointment with the Cancer Center please come in thru the  Main Entrance and check in at the main information desk   Today you received the following chemotherapy agents   To help prevent nausea and vomiting after your treatment, we encourage you to take your nausea medication     If you develop nausea and vomiting, or diarrhea that is not controlled by your medication, call the clinic.  The clinic phone number is [phone]. Office hours are Monday-Friday 8:30am-5:00pm.  BELOW ARE SYMPTOMS THAT SHOULD BE REPORTED IMMEDIATELY:  *FEVER GREATER THAN 101.0 F  *CHILLS WITH OR WITHOUT FEVER  NAUSEA AND VOMITING THAT IS NOT CONTROLLED WITH YOUR NAUSEA MEDICATION  *UNUSUAL SHORTNESS OF BREATH  *UNUSUAL BRUISING OR BLEEDING  TENDERNESS IN MOUTH AND THROAT WITH OR WITHOUT PRESENCE OF ULCERS  *URINARY PROBLEMS  *BOWEL PROBLEMS  UNUSUAL RASH Items with * indicate a potential emergency and should be followed up as soon as possible. If you have an emergency after office hours please contact your primary care physician or go to the nearest emergency department.  Please call the clinic during office hours if you have any questions or concerns.   You may also contact the Patient Navigator at [phone] should you have any questions or need assistance in obtaining follow up care.      Resources For Cancer Patients and their Caregivers ? American Cancer Society: Can assist with transportation, wigs, general needs, runs Look Good Feel Better.        1-888-227-6333 ? Cancer Care: Provides financial assistance, online support groups, medication/co-pay assistance.  1-800-813-HOPE (4673) ? [facility] Assists [place] cancer  patients and their families through emotional , educational and financial support.  336-427-4357 ? [place] DSS Where to apply for food stamps, Medicaid and utility assistance. 336-342-1394 ? [facility]: Transportation to medical appointments. 336-347-2287 ? Social Security Administration: May apply for disability if have a Stage IV cancer. 336-342-7796 1-800-772-1213 ? [place] Aging, Disability and Transit Services: Assists with nutrition, care and transit needs. 336-349-2343         

## 2018-03-24 NOTE — Assessment & Plan Note (Addendum)
1.  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 -As she has a high risk disease (based on SOFT and TEXT trails), I have recommended ovarian ablation with the aromatase inhibitor therapy (preferably exemestane). Patient is pre-menopausal.  I have recommended surgical nephrectomy. -Bilateral oophorectomy done on 03/16/2018 -We talked about initiating her on aromatase inhibitor, exemestane 25 mg daily for at least 5 years and possibly longer.  We talked about side effects in detail including but not limited to hot flashes, muscular skeletal symptoms, decreased bone mineral density among others.  I have sent a prescription to her pharmacy.  Should she get hot flashes, she would like to try over-the-counter phytoestrogens. -I have suggested her to take calcium and vitamin D supplements twice daily.  I have also recommended weightbearing exercises. -She will proceed with Herceptin and Pertuzumab today.  I have reviewed her echocardiogram dated 02/22/2018 which shows ejection fraction of 60 to 65%.  2.  High risk drug monitoring for Herceptin: She does not have any clinical signs or symptoms of CHF.  Echocardiogram on 02/22/2018 reviewed by me showed ejection fraction of 60 to 65%.  3.  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  She does have on and off numbness in the fingertips.  Even though she was prescribed gabapentin 100 mg 3 times daily, she never took it because of concern for side effects.

## 2018-03-24 NOTE — Progress Notes (Signed)
Examined by Dr. Delton Coombes today, labs drawn, 2 [facility] is in normal limits for treatment. Proceed with treatment today per Dr. Delton Coombes.  Treatment given per orders. Patient tolerated it well without problems. Vitals stable and discharged home from clinic ambulatory. Follow up as scheduled.

## 2018-03-25 ENCOUNTER — Ambulatory Visit (INDEPENDENT_AMBULATORY_CARE_PROVIDER_SITE_OTHER): Payer: MEDICAID | Admitting: Obstetrics & Gynecology

## 2018-03-25 ENCOUNTER — Encounter: Payer: Self-pay | Admitting: Obstetrics & Gynecology

## 2018-03-25 VITALS — BP 113/73 | HR 57 | Ht 65.0 in | Wt 127.0 lb

## 2018-03-25 DIAGNOSIS — Z9889 Other specified postprocedural states: Secondary | ICD-10-CM

## 2018-03-25 NOTE — Progress Notes (Signed)
  HPI: Patient returns for routine postoperative follow-up having undergone laparoscopic bilateral salpingo oophorectomy on 03/16/2018.  The patient's immediate postoperative recovery has been unremarkable. Since hospital discharge the patient reports doing well no complaints.   Current Outpatient Medications: exemestane (AROMASIN) 25 MG tablet, Take 1 tablet (25 mg total) by mouth daily after breakfast., Disp: 30 tablet, Rfl: 4 lidocaine-prilocaine (EMLA) cream, Apply to affected area once (Patient taking differently: Apply 1 application topically daily as needed (for port access (every 3 weeks)). Apply to affected area once), Disp: 30 g, Rfl: 3 Pertuzumab (PERJETA IV), Inject 1 Dose into the vein every 21 ( twenty-one) days. Every 3 weeks , Disp: , Rfl:  traMADol (ULTRAM) 50 MG tablet, Take 1 tablet (50 mg total) by mouth every 6 (six) hours as needed for moderate pain or severe pain., Disp: 20 tablet, Rfl: 0 Trastuzumab (HERCEPTIN IV), Inject 1 Dose into the vein every 21 ( twenty-one) days. Every 3 weeks , Disp: , Rfl:  venlafaxine XR (EFFEXOR XR) 37.5 MG 24 hr capsule, Take 1 capsule (37.5 mg total) by mouth daily with breakfast. (Patient not taking: Reported on 03/25/2018), Disp: 30 capsule, Rfl: 5  No current facility-administered medications for this visit.     Blood pressure 113/73, pulse (!) 57, height 5\' 5"  (1.651 m), weight 127 lb (57.6 kg).  Physical Exam: Incision x 5 all look good, gentian violet place, steri strips removed  Diagnostic Tests:   Pathology: benign  Impression: S/p   Plan: No additional pain meds needed  Follow up: prn    Florian Buff, MD

## 2018-04-13 ENCOUNTER — Other Ambulatory Visit (HOSPITAL_COMMUNITY): Payer: Self-pay | Admitting: Hematology

## 2018-04-14 ENCOUNTER — Ambulatory Visit (HOSPITAL_COMMUNITY): Payer: Self-pay | Admitting: Hematology

## 2018-04-14 ENCOUNTER — Ambulatory Visit (HOSPITAL_COMMUNITY): Payer: Self-pay

## 2018-04-14 ENCOUNTER — Inpatient Hospital Stay (HOSPITAL_COMMUNITY): Payer: Self-pay | Attending: Hematology

## 2018-04-14 ENCOUNTER — Encounter (HOSPITAL_COMMUNITY): Payer: Self-pay

## 2018-04-14 VITALS — BP 96/52 | HR 56 | Temp 97.8°F | Resp 18 | Wt 129.8 lb

## 2018-04-14 DIAGNOSIS — Z923 Personal history of irradiation: Secondary | ICD-10-CM | POA: Insufficient documentation

## 2018-04-14 DIAGNOSIS — Z803 Family history of malignant neoplasm of breast: Secondary | ICD-10-CM | POA: Insufficient documentation

## 2018-04-14 DIAGNOSIS — Z87891 Personal history of nicotine dependence: Secondary | ICD-10-CM | POA: Insufficient documentation

## 2018-04-14 DIAGNOSIS — Z9011 Acquired absence of right breast and nipple: Secondary | ICD-10-CM | POA: Insufficient documentation

## 2018-04-14 DIAGNOSIS — C773 Secondary and unspecified malignant neoplasm of axilla and upper limb lymph nodes: Secondary | ICD-10-CM | POA: Insufficient documentation

## 2018-04-14 DIAGNOSIS — Z5112 Encounter for antineoplastic immunotherapy: Secondary | ICD-10-CM | POA: Insufficient documentation

## 2018-04-14 DIAGNOSIS — Z170 Estrogen receptor positive status [ER+]: Secondary | ICD-10-CM | POA: Insufficient documentation

## 2018-04-14 DIAGNOSIS — Z79899 Other long term (current) drug therapy: Secondary | ICD-10-CM | POA: Insufficient documentation

## 2018-04-14 DIAGNOSIS — C50811 Malignant neoplasm of overlapping sites of right female breast: Secondary | ICD-10-CM | POA: Insufficient documentation

## 2018-04-14 MED ORDER — SODIUM CHLORIDE 0.9 % IV SOLN
420.0000 mg | Freq: Once | INTRAVENOUS | Status: AC
  Administered 2018-04-14: 420 mg via INTRAVENOUS
  Filled 2018-04-14: qty 14

## 2018-04-14 MED ORDER — TRASTUZUMAB CHEMO 150 MG IV SOLR
6.0000 mg/kg | Freq: Once | INTRAVENOUS | Status: AC
  Administered 2018-04-14: 336 mg via INTRAVENOUS
  Filled 2018-04-14: qty 16

## 2018-04-14 MED ORDER — SODIUM CHLORIDE 0.9% FLUSH
10.0000 mL | INTRAVENOUS | Status: DC | PRN
  Administered 2018-04-14: 10 mL
  Filled 2018-04-14: qty 10

## 2018-04-14 MED ORDER — HEPARIN SOD (PORK) LOCK FLUSH 100 UNIT/ML IV SOLN
500.0000 [IU] | Freq: Once | INTRAVENOUS | Status: AC | PRN
  Administered 2018-04-14: 500 [IU]
  Filled 2018-04-14: qty 5

## 2018-04-14 MED ORDER — ACETAMINOPHEN 325 MG PO TABS
650.0000 mg | ORAL_TABLET | Freq: Once | ORAL | Status: AC
  Administered 2018-04-14: 650 mg via ORAL
  Filled 2018-04-14: qty 2

## 2018-04-14 MED ORDER — SODIUM CHLORIDE 0.9 % IV SOLN
Freq: Once | INTRAVENOUS | Status: AC
  Administered 2018-04-14: 11:00:00 via INTRAVENOUS

## 2018-04-14 MED ORDER — DIPHENHYDRAMINE HCL 25 MG PO CAPS
25.0000 mg | ORAL_CAPSULE | Freq: Once | ORAL | Status: AC
  Administered 2018-04-14: 25 mg via ORAL
  Filled 2018-04-14: qty 1

## 2018-04-14 NOTE — Patient Instructions (Signed)
[facility] Discharge Instructions for Patients Receiving Chemotherapy   Beginning January 23rd 2017 lab work for the Cancer Center will be done in the  Main lab at [facility] on 1st floor. If you have a lab appointment with the Cancer Center please come in thru the  Main Entrance and check in at the main information desk   Today you received the following chemotherapy agents Herceptin and Perjeta. Follow-up as scheduled. Call clinic for any questions or concerns  To help prevent nausea and vomiting after your treatment, we encourage you to take your nausea medication   If you develop nausea and vomiting, or diarrhea that is not controlled by your medication, call the clinic.  The clinic phone number is [phone]. Office hours are Monday-Friday 8:30am-5:00pm.  BELOW ARE SYMPTOMS THAT SHOULD BE REPORTED IMMEDIATELY:  *FEVER GREATER THAN 101.0 F  *CHILLS WITH OR WITHOUT FEVER  NAUSEA AND VOMITING THAT IS NOT CONTROLLED WITH YOUR NAUSEA MEDICATION  *UNUSUAL SHORTNESS OF BREATH  *UNUSUAL BRUISING OR BLEEDING  TENDERNESS IN MOUTH AND THROAT WITH OR WITHOUT PRESENCE OF ULCERS  *URINARY PROBLEMS  *BOWEL PROBLEMS  UNUSUAL RASH Items with * indicate a potential emergency and should be followed up as soon as possible. If you have an emergency after office hours please contact your primary care physician or go to the nearest emergency department.  Please call the clinic during office hours if you have any questions or concerns.   You may also contact the Patient Navigator at [phone] should you have any questions or need assistance in obtaining follow up care.      Resources For Cancer Patients and their Caregivers ? American Cancer Society: Can assist with transportation, wigs, general needs, runs Look Good Feel Better.        1-888-227-6333 ? Cancer Care: Provides financial assistance, online support groups, medication/co-pay assistance.   1-800-813-HOPE (4673) ? [facility] Assists [place] cancer patients and their families through emotional , educational and financial support.  336-427-4357 ? [place] DSS Where to apply for food stamps, Medicaid and utility assistance. 336-342-1394 ? [employer]: Transportation to medical appointments. 336-347-2287 ? Social Security Administration: May apply for disability if have a Stage IV cancer. 336-342-7796 1-800-772-1213 ? [place] Aging, Disability and Transit Services: Assists with nutrition, care and transit needs. 336-349-2343         

## 2018-04-14 NOTE — Progress Notes (Signed)
Angel Hale tolerated chemo tx well without complaints or incident.No labs needed today per parameters VSS upon discharge. Pt discharged self ambulatory in satisfactory condition accompanied by her husband

## 2018-05-05 ENCOUNTER — Inpatient Hospital Stay (HOSPITAL_COMMUNITY): Payer: Self-pay | Attending: Internal Medicine

## 2018-05-05 ENCOUNTER — Encounter (HOSPITAL_COMMUNITY): Payer: Self-pay

## 2018-05-05 VITALS — BP 97/64 | HR 62 | Temp 98.0°F | Resp 18 | Wt 130.2 lb

## 2018-05-05 DIAGNOSIS — C50811 Malignant neoplasm of overlapping sites of right female breast: Secondary | ICD-10-CM

## 2018-05-05 DIAGNOSIS — C773 Secondary and unspecified malignant neoplasm of axilla and upper limb lymph nodes: Secondary | ICD-10-CM

## 2018-05-05 DIAGNOSIS — Z170 Estrogen receptor positive status [ER+]: Secondary | ICD-10-CM | POA: Insufficient documentation

## 2018-05-05 DIAGNOSIS — Z5112 Encounter for antineoplastic immunotherapy: Secondary | ICD-10-CM | POA: Insufficient documentation

## 2018-05-05 DIAGNOSIS — C50911 Malignant neoplasm of unspecified site of right female breast: Secondary | ICD-10-CM

## 2018-05-05 DIAGNOSIS — Z79811 Long term (current) use of aromatase inhibitors: Secondary | ICD-10-CM | POA: Insufficient documentation

## 2018-05-05 DIAGNOSIS — Z9011 Acquired absence of right breast and nipple: Secondary | ICD-10-CM | POA: Insufficient documentation

## 2018-05-05 DIAGNOSIS — Z923 Personal history of irradiation: Secondary | ICD-10-CM | POA: Insufficient documentation

## 2018-05-05 LAB — CBC WITH DIFFERENTIAL/PLATELET
BASOS PCT: 0 %
Basophils Absolute: 0 10*3/uL (ref 0.0–0.1)
Eosinophils Absolute: 0.1 10*3/uL (ref 0.0–0.7)
Eosinophils Relative: 2 %
HEMATOCRIT: 37.3 % (ref 36.0–46.0)
HEMOGLOBIN: 12.6 g/dL (ref 12.0–15.0)
LYMPHS ABS: 0.8 10*3/uL (ref 0.7–4.0)
Lymphocytes Relative: 17 %
MCH: 32.7 pg (ref 26.0–34.0)
MCHC: 33.8 g/dL (ref 30.0–36.0)
MCV: 96.9 fL (ref 78.0–100.0)
MONOS PCT: 6 %
Monocytes Absolute: 0.3 10*3/uL (ref 0.1–1.0)
NEUTROS ABS: 3.5 10*3/uL (ref 1.7–7.7)
NEUTROS PCT: 75 %
Platelets: 215 10*3/uL (ref 150–400)
RBC: 3.85 MIL/uL — AB (ref 3.87–5.11)
RDW: 13.9 % (ref 11.5–15.5)
WBC: 4.7 10*3/uL (ref 4.0–10.5)

## 2018-05-05 LAB — COMPREHENSIVE METABOLIC PANEL WITH GFR
ALT: 15 U/L (ref 0–44)
AST: 18 U/L (ref 15–41)
Albumin: 4 g/dL (ref 3.5–5.0)
Alkaline Phosphatase: 71 U/L (ref 38–126)
Anion gap: 7 (ref 5–15)
BUN: 10 mg/dL (ref 6–20)
CO2: 27 mmol/L (ref 22–32)
Calcium: 9.2 mg/dL (ref 8.9–10.3)
Chloride: 109 mmol/L (ref 98–111)
Creatinine, Ser: 0.67 mg/dL (ref 0.44–1.00)
GFR calc Af Amer: >60 mL/min
GFR calc non Af Amer: >60 mL/min
Glucose, Bld: 129 mg/dL — ABNORMAL HIGH (ref 70–99)
Potassium: 3.3 mmol/L — ABNORMAL LOW (ref 3.5–5.1)
Sodium: 143 mmol/L (ref 135–145)
Total Bilirubin: 0.4 mg/dL (ref 0.3–1.2)
Total Protein: 6.7 g/dL (ref 6.5–8.1)

## 2018-05-05 MED ORDER — TRASTUZUMAB CHEMO 150 MG IV SOLR
6.0000 mg/kg | Freq: Once | INTRAVENOUS | Status: AC
  Administered 2018-05-05: 336 mg via INTRAVENOUS
  Filled 2018-05-05: qty 16

## 2018-05-05 MED ORDER — ACETAMINOPHEN 325 MG PO TABS
650.0000 mg | ORAL_TABLET | Freq: Once | ORAL | Status: AC
  Administered 2018-05-05: 650 mg via ORAL
  Filled 2018-05-05: qty 2

## 2018-05-05 MED ORDER — SODIUM CHLORIDE 0.9 % IV SOLN
420.0000 mg | Freq: Once | INTRAVENOUS | Status: AC
  Administered 2018-05-05: 420 mg via INTRAVENOUS
  Filled 2018-05-05: qty 14

## 2018-05-05 MED ORDER — SODIUM CHLORIDE 0.9% FLUSH
10.0000 mL | INTRAVENOUS | Status: DC | PRN
  Administered 2018-05-05: 10 mL
  Filled 2018-05-05: qty 10

## 2018-05-05 MED ORDER — SODIUM CHLORIDE 0.9 % IV SOLN
Freq: Once | INTRAVENOUS | Status: AC
  Administered 2018-05-05: 12:00:00 via INTRAVENOUS

## 2018-05-05 MED ORDER — DIPHENHYDRAMINE HCL 25 MG PO CAPS
25.0000 mg | ORAL_CAPSULE | Freq: Once | ORAL | Status: AC
  Administered 2018-05-05: 25 mg via ORAL
  Filled 2018-05-05: qty 1

## 2018-05-05 MED ORDER — HEPARIN SOD (PORK) LOCK FLUSH 100 UNIT/ML IV SOLN
500.0000 [IU] | Freq: Once | INTRAVENOUS | Status: AC | PRN
  Administered 2018-05-05: 500 [IU]
  Filled 2018-05-05: qty 5

## 2018-05-05 NOTE — Patient Instructions (Signed)
[facility] Discharge Instructions for Patients Receiving Chemotherapy   Beginning January 23rd 2017 lab work for the [facility] will be done in the  Main lab at [facility] on 1st floor. If you have a lab appointment with the [facility] please come in thru the  Main Entrance and check in at the main information desk   Today you received the following chemotherapy agents Herceptin and Perjeta. Follow-up as scheduled. Call clinicmfor any questions or concerns  To help prevent nausea and vomiting after your treatment, we encourage you to take your nausea medication   If you develop nausea and vomiting, or diarrhea that is not controlled by your medication, call the clinic.  The clinic phone number is [phone]. Office hours are Monday-Friday 8:30am-5:00pm.  BELOW ARE SYMPTOMS THAT SHOULD BE REPORTED IMMEDIATELY:  *FEVER GREATER THAN 101.0 F  *CHILLS WITH OR WITHOUT FEVER  NAUSEA AND VOMITING THAT IS NOT CONTROLLED WITH YOUR NAUSEA MEDICATION  *UNUSUAL SHORTNESS OF BREATH  *UNUSUAL BRUISING OR BLEEDING  TENDERNESS IN MOUTH AND THROAT WITH OR WITHOUT PRESENCE OF ULCERS  *URINARY PROBLEMS  *BOWEL PROBLEMS  UNUSUAL RASH Items with * indicate a potential emergency and should be followed up as soon as possible. If you have an emergency after office hours please contact your primary care physician or go to the nearest emergency department.  Please call the clinic during office hours if you have any questions or concerns.   You may also contact the Patient Navigator at [phone] should you have any questions or need assistance in obtaining follow up care.      Resources For Cancer Patients and their Caregivers ? American Cancer Society: Can assist with transportation, wigs, general needs, runs Look Good Feel Better.        806-379-6034 ? Cancer Care: Provides financial assistance, online support groups, medication/co-pay assistance.   1-800-813-HOPE 971-763-3271) ? [facility] Assists [place] cancer patients and their families through emotional , educational and financial support.  347-694-2257 ? [place] DSS Where to apply for food stamps, Medicaid and utility assistance. (251)808-6560 ? [employer]: Transportation to medical appointments. 303-341-0605 ? Social Security Administration: May apply for disability if have a Stage IV cancer. ([phone] [phone] ? [employer], Disability and Transit Services: Assists with nutrition, care and transit needs. 316-105-0294

## 2018-05-05 NOTE — Progress Notes (Signed)
Angel Hale tolerated chemo tx well without complaints or incident. VSS upon discharge. Pt discharged self ambulatory in satisfactory condition accompanied by her husband

## 2018-05-26 ENCOUNTER — Other Ambulatory Visit: Payer: Self-pay

## 2018-05-26 ENCOUNTER — Inpatient Hospital Stay (HOSPITAL_COMMUNITY): Payer: Self-pay

## 2018-05-26 ENCOUNTER — Ambulatory Visit (HOSPITAL_COMMUNITY): Payer: Self-pay

## 2018-05-26 ENCOUNTER — Encounter (HOSPITAL_COMMUNITY): Payer: Self-pay | Admitting: Hematology

## 2018-05-26 ENCOUNTER — Ambulatory Visit (HOSPITAL_COMMUNITY): Payer: Self-pay | Admitting: Hematology

## 2018-05-26 ENCOUNTER — Inpatient Hospital Stay (HOSPITAL_COMMUNITY): Payer: Self-pay | Attending: Hematology | Admitting: Hematology

## 2018-05-26 VITALS — BP 98/58 | HR 66 | Temp 97.6°F | Resp 18 | Wt 131.4 lb

## 2018-05-26 DIAGNOSIS — R21 Rash and other nonspecific skin eruption: Secondary | ICD-10-CM | POA: Insufficient documentation

## 2018-05-26 DIAGNOSIS — Z170 Estrogen receptor positive status [ER+]: Principal | ICD-10-CM

## 2018-05-26 DIAGNOSIS — C773 Secondary and unspecified malignant neoplasm of axilla and upper limb lymph nodes: Secondary | ICD-10-CM | POA: Insufficient documentation

## 2018-05-26 DIAGNOSIS — Z79899 Other long term (current) drug therapy: Secondary | ICD-10-CM | POA: Insufficient documentation

## 2018-05-26 DIAGNOSIS — Z87891 Personal history of nicotine dependence: Secondary | ICD-10-CM | POA: Insufficient documentation

## 2018-05-26 DIAGNOSIS — Z9011 Acquired absence of right breast and nipple: Secondary | ICD-10-CM | POA: Insufficient documentation

## 2018-05-26 DIAGNOSIS — Z923 Personal history of irradiation: Secondary | ICD-10-CM | POA: Insufficient documentation

## 2018-05-26 DIAGNOSIS — Z880 Allergy status to penicillin: Secondary | ICD-10-CM | POA: Insufficient documentation

## 2018-05-26 DIAGNOSIS — C50811 Malignant neoplasm of overlapping sites of right female breast: Secondary | ICD-10-CM | POA: Insufficient documentation

## 2018-05-26 DIAGNOSIS — Z5112 Encounter for antineoplastic immunotherapy: Secondary | ICD-10-CM | POA: Insufficient documentation

## 2018-05-26 DIAGNOSIS — C50911 Malignant neoplasm of unspecified site of right female breast: Secondary | ICD-10-CM

## 2018-05-26 DIAGNOSIS — Z90722 Acquired absence of ovaries, bilateral: Secondary | ICD-10-CM | POA: Insufficient documentation

## 2018-05-26 DIAGNOSIS — Z79811 Long term (current) use of aromatase inhibitors: Secondary | ICD-10-CM | POA: Insufficient documentation

## 2018-05-26 MED ORDER — DIPHENHYDRAMINE HCL 25 MG PO CAPS
25.0000 mg | ORAL_CAPSULE | Freq: Once | ORAL | Status: AC
  Administered 2018-05-26: 25 mg via ORAL
  Filled 2018-05-26: qty 1

## 2018-05-26 MED ORDER — SODIUM CHLORIDE 0.9% FLUSH
10.0000 mL | INTRAVENOUS | Status: DC | PRN
  Administered 2018-05-26: 10 mL
  Filled 2018-05-26: qty 10

## 2018-05-26 MED ORDER — HEPARIN SOD (PORK) LOCK FLUSH 100 UNIT/ML IV SOLN
500.0000 [IU] | Freq: Once | INTRAVENOUS | Status: AC | PRN
  Administered 2018-05-26: 500 [IU]

## 2018-05-26 MED ORDER — SODIUM CHLORIDE 0.9 % IV SOLN
420.0000 mg | Freq: Once | INTRAVENOUS | Status: AC
  Administered 2018-05-26: 420 mg via INTRAVENOUS
  Filled 2018-05-26: qty 14

## 2018-05-26 MED ORDER — SODIUM CHLORIDE 0.9 % IV SOLN
Freq: Once | INTRAVENOUS | Status: AC
  Administered 2018-05-26: 13:00:00 via INTRAVENOUS

## 2018-05-26 MED ORDER — TRASTUZUMAB CHEMO 150 MG IV SOLR
6.0000 mg/kg | Freq: Once | INTRAVENOUS | Status: AC
  Administered 2018-05-26: 336 mg via INTRAVENOUS
  Filled 2018-05-26: qty 16

## 2018-05-26 MED ORDER — ACETAMINOPHEN 325 MG PO TABS
650.0000 mg | ORAL_TABLET | Freq: Once | ORAL | Status: AC
  Administered 2018-05-26: 650 mg via ORAL
  Filled 2018-05-26: qty 2

## 2018-05-26 NOTE — Patient Instructions (Signed)
[place] at [facility] Discharge Instructions  Follow up in 6 weeks with labs.  Have mammogram  Follow up with Dr. Constance Haw office to have port removed.    Thank you for choosing [place] at [facility] to provide your oncology and hematology care.  To afford each patient quality time with our provider, please arrive at least 15 minutes before your scheduled appointment time.   If you have a lab appointment with the [facility] please come in thru the  Main Entrance and check in at the main information desk  You need to re-schedule your appointment should you arrive 10 or more minutes late.  We strive to give you quality time with our providers, and arriving late affects you and other patients whose appointments are after yours.  Also, if you no show three or more times for appointments you may be dismissed from the clinic at the providers discretion.     Again, thank you for choosing [facility].  Our hope is that these requests will decrease the amount of time that you wait before being seen by our physicians.       _____________________________________________________________  Should you have questions after your visit to [facility], please contact our office at [phone] between the hours of 8:00 a.m. and 4:30 p.m.  Voicemails left after 4:00 p.m. will not be returned until the following business day.  For prescription refill requests, have your pharmacy contact our office and allow 72 hours.    Cancer Center Support Programs:   > Cancer Support Group  2nd Tuesday of the month 1pm-2pm, Journey Room

## 2018-05-26 NOTE — Assessment & Plan Note (Addendum)
1.  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 -As she has a high risk disease (based on SOFT and TEXT trails), I have recommended ovarian ablation with the aromatase inhibitor therapy (preferably exemestane). Patient is pre-menopausal.  I have recommended surgical oophorectomy. -Bilateral oophorectomy done on 03/16/2018 -At last visit we talked about starting her on exemestane 25 mg daily for at least 5 years and possibly longer.  We have sent a prescription but she did not know to pick it up.  She was told to start taking it on a daily basis. -She was also instructed to continue taking calcium and vitamin D twice daily.  She will continue exercises including weightbearing. -Her last echocardiogram on 02/22/2018 shows ejection fraction of 60 to 65%.  She will proceed with her last dose of Herceptin and Pertuzumab today.  She wants to have a port discontinued.  We will make a referral to Dr. Constance Haw. - Right mastectomy site is within normal limits.  No palpable adenopathy.  We will order left breast mammogram.  I will see her back in 6 weeks for follow-up to see how she is tolerating exemestane.  She refused flu vaccine.  2.  High risk drug monitoring for Herceptin: She does not have any clinical signs or symptoms of CHF.  Echocardiogram on 02/22/2018 reviewed by me showed ejection fraction of 60 to 65%.  3.  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  She does have on and off numbness in the fingertips.  Even though she was prescribed gabapentin 100 mg 3 times daily, she never took it because of concern for side effects.

## 2018-05-26 NOTE — Progress Notes (Signed)
1300 Pt seen by Dr. Delton Coombes today and pt approved for chemo tx without needing to draw labs since this is the pt's last tx per MD                   Angel Hale tolerated chemo tx well without complaints or incident. VSS upon discharge. Pt discharged self ambulatory in satisfactory condition accompanied by her husband

## 2018-05-26 NOTE — Patient Instructions (Signed)
[facility] Discharge Instructions for Patients Receiving Chemotherapy   Beginning January 23rd 2017 lab work for the Cancer Center will be done in the  Main lab at [facility] on 1st floor. If you have a lab appointment with the Cancer Center please come in thru the  Main Entrance and check in at the main information desk   Today you received the following chemotherapy agents Herceptin and Perjeta. Follow-up as scheduled. Call clinic for any questions or concerns  To help prevent nausea and vomiting after your treatment, we encourage you to take your nausea medication   If you develop nausea and vomiting, or diarrhea that is not controlled by your medication, call the clinic.  The clinic phone number is [phone]. Office hours are Monday-Friday 8:30am-5:00pm.  BELOW ARE SYMPTOMS THAT SHOULD BE REPORTED IMMEDIATELY:  *FEVER GREATER THAN 101.0 F  *CHILLS WITH OR WITHOUT FEVER  NAUSEA AND VOMITING THAT IS NOT CONTROLLED WITH YOUR NAUSEA MEDICATION  *UNUSUAL SHORTNESS OF BREATH  *UNUSUAL BRUISING OR BLEEDING  TENDERNESS IN MOUTH AND THROAT WITH OR WITHOUT PRESENCE OF ULCERS  *URINARY PROBLEMS  *BOWEL PROBLEMS  UNUSUAL RASH Items with * indicate a potential emergency and should be followed up as soon as possible. If you have an emergency after office hours please contact your primary care physician or go to the nearest emergency department.  Please call the clinic during office hours if you have any questions or concerns.   You may also contact the Patient Navigator at [phone] should you have any questions or need assistance in obtaining follow up care.      Resources For Cancer Patients and their Caregivers ? American Cancer Society: Can assist with transportation, wigs, general needs, runs Look Good Feel Better.        1-888-227-6333 ? Cancer Care: Provides financial assistance, online support groups, medication/co-pay assistance.   1-800-813-HOPE (4673) ? [facility] Assists [place] cancer patients and their families through emotional , educational and financial support.  336-427-4357 ? [place] DSS Where to apply for food stamps, Medicaid and utility assistance. 336-342-1394 ? [employer]: Transportation to medical appointments. 336-347-2287 ? Social Security Administration: May apply for disability if have a Stage IV cancer. 336-342-7796 1-800-772-1213 ? [place] Aging, Disability and Transit Services: Assists with nutrition, care and transit needs. 336-349-2343         

## 2018-05-26 NOTE — Progress Notes (Signed)
Angel Hale, [place] [id number]   CLINIC:  Medical Oncology/Hematology  PCP:  Patient, No Pcp Per No address on file None   REASON FOR VISIT: Follow-up for breast cancer, ER+/PR+/HER2+  CURRENT THERAPY: Herceptin and pertuzumab  BRIEF ONCOLOGIC HISTORY:    Malignant neoplasm of overlapping sites of right breast in female, estrogen receptor positive ([place])   05/12/2017 Initial Biopsy    (R) breast (LIQ): IDC, grade 3; ER+ (80%), PR+ (10%), HER2+ (ratio 2.56). Ki67 80%.      05/12/2017 Procedure    Additional (R) breast biopsy (UOQ): IDC, grade 3; ER+ (80%), PR+ (50%), HER2+ (ratio 2.77), Ki67 90%.     05/12/2017 Procedure    (R) axillary lymph node biopsy: Metastatic carcinoma; ER+ (80%), PR+ (30%), HER2+ (ratio 2.99), Ki67 80%.     05/12/2017 Imaging    Mammogram/breast US: Targeted ultrasound is performed, showing multiple irregular hypoechoic masses. In the 6 o'clock region of the right breast is a large irregular mass spans at least 4.8 cm. In the 4:00 to 5:00 region of the right breast an irregular mass measures 4.5 cm. In the 930-10 o'clock position of the right breast centered 9-10 cm from the nipple is an irregular 4.0 x 3.8 x 1.9 cm hypoechoic mass. In the 9:30 position of the right breast at the lateral margin is a probable abnormal intramammary lymph node measuring 1.3 cm greatest diameter. In the 10 o'clock position of the right breast far lateral is a hypoechoic dermal nodule that measures 0.5 cm. Diffuse skin thickening of the right breast measures up to 4.5 mm.    05/14/2017 Tumor Marker      Ref. Range 05/14/2017 15:30  CA 15-3 Latest Ref Range: 0.0 - 25.0 U/mL 115.9 (H)     Ref. Range 05/14/2017 15:30  CA 27.29 Latest Ref Range: 0.0 - 38.6 U/mL 157.7 (H)      05/15/2017 Imaging    CT chest/abd/pelvis:  IMPRESSION: 1. Numerous right breast masses and metastatic right axillary lymphadenopathy. Findings suspicious for chest wall  invasion along the lower lateral aspect of the breast. 2. Two small indeterminate right lower lobe pulmonary nodules will require surveillance. 3. No findings for metastatic disease involving the abdomen/pelvis or osseous structures.    05/15/2017 Imaging    Bone scan:  IMPRESSION: No evidence of metastatic disease.    05/18/2017 Echocardiogram    LV EF: 60% -   65%    05/20/2017 Procedure    Port-a-cath placement     05/25/2017 Initial Diagnosis    Malignant neoplasm of overlapping sites of right breast in female, estrogen receptor positive ([place])    05/27/2017 - 09/10/2017 Neo-Adjuvant Chemotherapy    TCHPx 6 cycles    10/14/2017 Surgery    Right modified radical mastectomy and lymph node biopsy on 10/14/2017, with complete pathological response, [id number]      CANCER STAGING: Cancer Staging Malignant neoplasm of overlapping sites of right breast in female, estrogen receptor positive ([place]) Staging form: Breast, AJCC 8th Edition - Clinical stage from 05/25/2017: Stage IIIB (cT4b, cN3a, cM0, G3, ER+, PR+, HER2+) - Signed by Holley Bouche, NP on 07/07/2017  Malignant neoplasm of overlapping sites of right female breast [facility]) Staging form: Breast, AJCC 8th Edition - Clinical: No Stage Recommended (ycT4b, cN3a, cM0, G3, ER: Positive, PR: Positive, HER2: Positive) - Signed by Twana First, MD on 05/25/2017    INTERVAL HISTORY:  Angel Hale 47 y.o. female returns for routine follow-up for  breast cancer. Patient is here today for her last treatment. She is tolerating treatment well. She is voicing anxiety about having her next mammogram. She has a mild rash on her upper back and shoulders. It itches at night. Her appetite is 100% and she is maintaining her weight. Her energy level is 75%.     REVIEW OF SYSTEMS:  Review of Systems  Skin: Positive for itching and rash (upper back and shoulders).  All other systems reviewed and are negative.    PAST MEDICAL/SURGICAL HISTORY:    Past Medical History:  Diagnosis Date  . Cancer [facility])    right breast cancer  . History of radiation therapy 12/03/17- 01/13/18   Right Chest wall and supraclavicular region treated to 50 Gy with 25 fx of 2 Gy followed by a boost of 10 Gy in 5 fx.    Past Surgical History:  Procedure Laterality Date  . BUNIONECTOMY Left 2000  . EXTERNAL FIXATION LEG Left 02/16/2017   Procedure: EXTERNAL FIXATION LEG;  Surgeon: Mcarthur Rossetti, MD;  Location: [place];  Service: Orthopedics;  Laterality: Left;  . HARDWARE REMOVAL Left 05/21/2017   Procedure: REMOVAL OF RETAINED PROMINENT SCREW LEFT ANKLE;  Surgeon: Mcarthur Rossetti, MD;  Location: [place];  Service: Orthopedics;  Laterality: Left;  . LAPAROSCOPIC BILATERAL SALPINGO OOPHERECTOMY Bilateral 03/16/2018   Procedure: LAPAROSCOPIC BILATERAL SALPINGO OOPHORECTOMY;  Surgeon: Jonnie Kind, MD;  [facility];  Service: Gynecology;  Laterality: Bilateral;  . LAPAROSCOPIC LYSIS OF ADHESIONS N/A 03/16/2018   Procedure: LAPAROSCOPIC LYSIS OF ADHESIONS;  Surgeon: Jonnie Kind, MD;  Location: [facility];  Service: Gynecology;  Laterality: N/A;  . MASTECTOMY MODIFIED RADICAL Right 10/14/2017   Procedure: MASTECTOMY MODIFIED RADICAL;  Surgeon: Virl Cagey, MD;  Location[facility];  Service: General;  Laterality: Right;  . ORIF ANKLE FRACTURE Left 03/03/2017  . ORIF ANKLE FRACTURE Left 03/03/2017   Procedure: OPEN REDUCTION INTERNAL FIXATION (ORIF) LEFT PILON FRACTURE AND LEFT LATERAL MALLEOLUS;  Surgeon: Mcarthur Rossetti, MD;  Location: [place];  Service: Orthopedics;  Laterality: Left;  . OVARIAN CYST REMOVAL Left 1991  . PORTACATH PLACEMENT    . PORTACATH PLACEMENT Left 05/20/2017   Procedure: INSERTION PORT-A-CATH;  Surgeon: Virl Cagey, MD;  Location[facility];  Service: General;  Laterality: Left;  . WISDOM TOOTH EXTRACTION       SOCIAL HISTORY:  Social History   Socioeconomic History  . Marital status: Married     Spouse name: Not on file  . Number of children: Not on file  . Years of education: Not on file  . Highest education level: Not on file  Occupational History  . Not on file  Social Needs  . Financial resource strain: Not on file  . Food insecurity:    Worry: Not on file    Inability: Not on file  . Transportation needs:    Medical: Not on file    Non-medical: Not on file  Tobacco Use  . Smoking status: Former Smoker    Packs/day: 1.00    Years: 10.00    Pack years: 10.00    Types: Cigarettes    Last attempt to quit: 02/16/2017    Years since quitting: 1.2  . Smokeless tobacco: Never Used  Substance and Sexual Activity  . Alcohol use: Yes    Comment: occasional  . Drug use: No  . Sexual activity: Yes    Birth control/protection: None  Lifestyle  . Physical activity:  Days per week: Not on file    Minutes per session: Not on file  . Stress: Not on file  Relationships  . Social connections:    Talks on phone: Not on file    Gets together: Not on file    Attends religious service: Not on file    Active member of club or organization: Not on file    Attends meetings of clubs or organizations: Not on file    Relationship status: Not on file  . Intimate partner violence:    Fear of current or ex partner: Not on file    Emotionally abused: Not on file    Physically abused: Not on file    Forced sexual activity: Not on file  Other Topics Concern  . Not on file  Social History Narrative  . Not on file    FAMILY HISTORY:  Family History  Problem Relation Age of Onset  . Colon cancer Father     CURRENT MEDICATIONS:  Outpatient Encounter Medications as of 05/26/2018  Medication Sig  . exemestane (AROMASIN) 25 MG tablet Take 1 tablet (25 mg total) by mouth daily after breakfast.  . Pertuzumab (PERJETA IV) Inject 1 Dose into the vein every 21 ( twenty-one) days. Every 3 weeks   . traMADol (ULTRAM) 50 MG tablet Take 1 tablet (50 mg total) by mouth every 6 (six) hours as  needed for moderate pain or severe pain.  . Trastuzumab (HERCEPTIN IV) Inject 1 Dose into the vein every 21 ( twenty-one) days. Every 3 weeks   . venlafaxine XR (EFFEXOR XR) 37.5 MG 24 hr capsule Take 1 capsule (37.5 mg total) by mouth daily with breakfast. (Patient not taking: Reported on 03/25/2018)   No facility-administered encounter medications on file as of 05/26/2018.     ALLERGIES:  Allergies  Allergen Reactions  . Penicillins Nausea And Vomiting    Has patient had a PCN reaction causing immediate rash, facial/tongue/throat swelling, SOB or lightheadedness with hypotension: No Has patient had a PCN reaction causing severe rash involving mucus membranes or skin necrosis: No Has patient had a PCN reaction that required hospitalization: No Has patient had a PCN reaction occurring within the last 10 years: No If all of the above answers are "NO", then may proceed with Cephalosporin use.      PHYSICAL EXAM:  ECOG Performance status: 1  VITAL SIGNS:BP 102/60, P:53, R:18, T:98.2, SATS:100% Weight: 131.4  Physical Exam  Constitutional: She is oriented to person, place, and time. She appears well-developed and well-nourished.  Cardiovascular: Normal rate, regular rhythm and normal heart sounds.  Pulmonary/Chest: Effort normal and breath sounds normal.  Abdominal: Soft.  Musculoskeletal: Normal range of motion.  Neurological: She is alert and oriented to person, place, and time.  Skin: Skin is warm and dry.  Psychiatric: She has a normal mood and affect. Her behavior is normal. Judgment and thought content normal.  Right mastectomy site is within normal limits.  No palpable supraclavicular or axillary adenopathy. Abdomen: Soft nontender with no palpable abnormality.   LABORATORY DATA:  I have reviewed the labs as listed.  CBC    Component Value Date/Time   WBC 4.7 05/05/2018 1213   RBC 3.85 (L) 05/05/2018 1213   HGB 12.6 05/05/2018 1213   HCT 37.3 05/05/2018 1213   PLT 215  05/05/2018 1213   MCV 96.9 05/05/2018 1213   MCH 32.7 05/05/2018 1213   MCHC 33.8 05/05/2018 1213   RDW 13.9 05/05/2018 1213   LYMPHSABS 0.8  05/05/2018 1213   MONOABS 0.3 05/05/2018 1213   EOSABS 0.1 05/05/2018 1213   BASOSABS 0.0 05/05/2018 1213   CMP Latest Ref Rng & Units 05/05/2018 03/24/2018 03/17/2018  Glucose 70 - 99 mg/dL 129(H) 92 93  BUN 6 - 20 mg/dL '10 12 8  ' Creatinine 0.44 - 1.00 mg/dL 0.67 0.72 0.61  Sodium 135 - 145 mmol/L 143 141 141  Potassium 3.5 - 5.1 mmol/L 3.3(L) 3.8 3.3(L)  Chloride 98 - 111 mmol/L 109 106 110  CO2 22 - 32 mmol/L '27 27 26  ' Calcium 8.9 - 10.3 mg/dL 9.2 9.4 8.4(L)  Total Protein 6.5 - 8.1 g/dL 6.7 7.0 -  Total Bilirubin 0.3 - 1.2 mg/dL 0.4 0.7 -  Alkaline Phos 38 - 126 U/L 71 76 -  AST 15 - 41 U/L 18 16 -  ALT 0 - 44 U/L 15 14 -         ASSESSMENT & PLAN:   Malignant neoplasm of overlapping sites of right female breast ([place]) 1.  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 -As she has a high risk disease (based on SOFT and TEXT trails), I have recommended ovarian ablation with the aromatase inhibitor therapy (preferably exemestane). Patient is pre-menopausal.  I have recommended surgical oophorectomy. -Bilateral oophorectomy done on 03/16/2018 -At last visit we talked about starting her on exemestane 25 mg daily for at least 5 years and possibly longer.  We have sent a prescription but she did not know to pick it up.  She was told to start taking it on a daily basis. -She was also instructed to continue taking calcium and vitamin D twice daily.  She will continue exercises including weightbearing. -Her last echocardiogram on 02/22/2018 shows ejection fraction of 60 to 65%.  She will proceed with her last dose of Herceptin and Pertuzumab  today.  She wants to have a port discontinued.  We will make a referral to Dr. Constance Haw. - Right mastectomy site is within normal limits.  No palpable adenopathy.  We will order left breast mammogram.  I will see her back in 6 weeks for follow-up to see how she is tolerating exemestane.  She refused flu vaccine.  2.  High risk drug monitoring for Herceptin: She does not have any clinical signs or symptoms of CHF.  Echocardiogram on 02/22/2018 reviewed by me showed ejection fraction of 60 to 65%.  3.  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  She does have on and off numbness in the fingertips.  Even though she was prescribed gabapentin 100 mg 3 times daily, she never took it because of concern for side effects.      Orders placed this encounter:  Orders Placed This Encounter  Procedures  . MM Digital Diagnostic Unilat L  . CBC with Differential/Platelet  . Comprehensive metabolic panel      Derek Jack, MD [place] [phone]

## 2018-05-28 ENCOUNTER — Telehealth (HOSPITAL_COMMUNITY): Payer: Self-pay | Admitting: *Deleted

## 2018-05-28 ENCOUNTER — Telehealth (HOSPITAL_COMMUNITY): Payer: Self-pay | Admitting: Pharmacist

## 2018-05-28 NOTE — Telephone Encounter (Signed)
Oral Chemotherapy Pharmacist Encounter   Attempted to reach patient to discuss Aromasin (exemestane). No answer. Unable to leave VM for patient to call back.   Will attempt another call on Monday.  Darl Pikes, PharmD, BCPS, [facility] Hematology/Oncology Clinical Pharmacist [facility] Oral [facility] [phone]  05/28/2018 3:10 PM

## 2018-05-28 NOTE — Telephone Encounter (Signed)
Pt states that she has the Aromasin but is very unsure about taking the medication and it's side effects. The pt stated that she is just not very comfortable about the medication. I advised the pt that I would have our Nurse Navigator give her a call to discuss her concerns. Pt was very grateful. Pt has the medication but has not started the medication, will wait until she discusses things with Diane.

## 2018-05-28 NOTE — Telephone Encounter (Signed)
I called patient and she had concerns and questions about side effects. I discussed side effects and how we would treat those if they became to intolerable. I explained to patient that she needs to keep [place] informed and she verbalizes understanding.  I explained to her that we are available even though she isn't coming for treatments any more and she verbalizes understanding of that.  She will call us with any other questions.

## 2018-05-28 NOTE — Telephone Encounter (Signed)
Oral Chemotherapy Pharmacist Encounter   Received notification that [name]'s questions were answered.   [name] saw my missed called and returned my call. She stated that she felt much better about starting the Aromasin. I told her I would place a medication handout in the mail to her and my business card for her to reach out if she has any additional questions.  Darl Pikes, PharmD, BCPS, [facility] Hematology/Oncology Clinical Pharmacist [facility] Oral [name] [phone]  05/28/2018 3:49 PM

## 2018-05-28 NOTE — Telephone Encounter (Signed)
-----   Message from Glennie Isle, NP-C sent at 05/28/2018  9:54 AM EDT ----- That was the one we sent it in July just make sure it is the right medication. Its called AROMASIN  ----- Message ----- From: Farley Ly, LPN Sent: 21/0/3128  10:12 AM EDT To: Glennie Isle, NP-C  Pt's husband called stating that he went to the pharmacy and the only Rx they had for the pt was from July. Can you send in whatever she needs to [facility]

## 2018-06-01 ENCOUNTER — Other Ambulatory Visit (HOSPITAL_COMMUNITY): Payer: Self-pay | Admitting: Nurse Practitioner

## 2018-06-01 DIAGNOSIS — Z1231 Encounter for screening mammogram for malignant neoplasm of breast: Secondary | ICD-10-CM

## 2018-06-08 ENCOUNTER — Encounter (HOSPITAL_COMMUNITY): Payer: Self-pay

## 2018-06-14 ENCOUNTER — Ambulatory Visit (HOSPITAL_COMMUNITY)
Admission: RE | Admit: 2018-06-14 | Discharge: 2018-06-14 | Disposition: A | Discharge status: Home / Self Care | Payer: Self-pay | Source: Ambulatory Visit | Attending: Nurse Practitioner | Admitting: Nurse Practitioner

## 2018-06-14 ENCOUNTER — Encounter (HOSPITAL_COMMUNITY): Payer: Self-pay

## 2018-06-14 DIAGNOSIS — Z1231 Encounter for screening mammogram for malignant neoplasm of breast: Secondary | ICD-10-CM | POA: Insufficient documentation

## 2018-06-14 HISTORY — DX: Personal history of irradiation: Z92.3

## 2018-06-14 HISTORY — DX: Personal history of antineoplastic chemotherapy: Z92.21

## 2018-06-15 ENCOUNTER — Encounter: Payer: Self-pay | Admitting: General Surgery

## 2018-06-15 ENCOUNTER — Ambulatory Visit (INDEPENDENT_AMBULATORY_CARE_PROVIDER_SITE_OTHER): Payer: Self-pay | Admitting: General Surgery

## 2018-06-15 VITALS — BP 111/62 | HR 65 | Temp 97.8°F | Resp 18 | Wt 131.6 lb

## 2018-06-15 DIAGNOSIS — Z95828 Presence of other vascular implants and grafts: Secondary | ICD-10-CM

## 2018-06-15 DIAGNOSIS — C50811 Malignant neoplasm of overlapping sites of right female breast: Secondary | ICD-10-CM

## 2018-06-15 DIAGNOSIS — Z170 Estrogen receptor positive status [ER+]: Secondary | ICD-10-CM

## 2018-06-15 NOTE — Progress Notes (Signed)
[facility] History and Physical   CC: Port a Catheter in Place  Angel Hale is a 47 y.o. female.  HPI: Angel Hale is a 47 yo well known to me with a history of R breast cancer diagnosed last year s/p modified radical mastectomy after neoadjuvant therapy. She has completed her therapies and is here today for port a catheter removal options. She is doing well and has no complaints. She is feeling good, and had a recent screening mammogram on the left that was negative. She is overall in good spirits and is ready to get the port out. She has had no issues with the port, and has had no recent issues with bleeding or infection.  She is not on a blood thinner.   Past Medical History:  Diagnosis Date  . Cancer [facility])    right breast cancer  . History of radiation therapy 12/03/17- 01/13/18   Right Chest wall and supraclavicular region treated to 50 Gy with 25 fx of 2 Gy followed by a boost of 10 Gy in 5 fx.   . Personal history of chemotherapy   . Personal history of radiation therapy     Past Surgical History:  Procedure Laterality Date  . BUNIONECTOMY Left 2000  . EXTERNAL FIXATION LEG Left 02/16/2017   Procedure: EXTERNAL FIXATION LEG;  Surgeon: Mcarthur Rossetti, MD;  Location: [place];  Service: Orthopedics;  Laterality: Left;  . HARDWARE REMOVAL Left 05/21/2017   Procedure: REMOVAL OF RETAINED PROMINENT SCREW LEFT ANKLE;  Surgeon: Mcarthur Rossetti, MD;  Location: [place];  Service: Orthopedics;  Laterality: Left;  . LAPAROSCOPIC BILATERAL SALPINGO OOPHERECTOMY Bilateral 03/16/2018   Procedure: LAPAROSCOPIC BILATERAL SALPINGO OOPHORECTOMY;  Surgeon: Jonnie Kind, MD;  [facility];  Service: Gynecology;  Laterality: Bilateral;  . LAPAROSCOPIC LYSIS OF ADHESIONS N/A 03/16/2018   Procedure: LAPAROSCOPIC LYSIS OF ADHESIONS;  Surgeon: Jonnie Kind, MD;  Location: [facility];  Service: Gynecology;  Laterality: N/A;  . MASTECTOMY MODIFIED RADICAL Right  10/14/2017   Procedure: MASTECTOMY MODIFIED RADICAL;  Surgeon: Virl Cagey, MD;  Location[facility];  Service: General;  Laterality: Right;  . ORIF ANKLE FRACTURE Left 03/03/2017  . ORIF ANKLE FRACTURE Left 03/03/2017   Procedure: OPEN REDUCTION INTERNAL FIXATION (ORIF) LEFT PILON FRACTURE AND LEFT LATERAL MALLEOLUS;  Surgeon: Mcarthur Rossetti, MD;  Location: [place];  Service: Orthopedics;  Laterality: Left;  . OVARIAN CYST REMOVAL Left 1991  . PORTACATH PLACEMENT    . PORTACATH PLACEMENT Left 05/20/2017   Procedure: INSERTION PORT-A-CATH;  Surgeon: Virl Cagey, MD;  Location: [facility];  Service: General;  Laterality: Left;  . WISDOM TOOTH EXTRACTION      Family History  Problem Relation Age of Onset  . Colon cancer Father     Social History   Tobacco Use  . Smoking status: Former Smoker    Packs/day: 1.00    Years: 10.00    Pack years: 10.00    Types: Cigarettes    Last attempt to quit: 02/16/2017    Years since quitting: 1.3  . Smokeless tobacco: Never Used  Substance Use Topics  . Alcohol use: Yes    Comment: occasional  . Drug use: No    Medications: I have reviewed the patient's current medications. Allergies as of 06/15/2018      Reactions   Penicillins Nausea And Vomiting   Has patient had a PCN reaction causing immediate rash, facial/tongue/throat swelling, SOB or lightheadedness with hypotension: No  Has patient had a PCN reaction causing severe rash involving mucus membranes or skin necrosis: No Has patient had a PCN reaction that required hospitalization: No Has patient had a PCN reaction occurring within the last 10 years: No If all of the above answers are "NO", then may proceed with Cephalosporin use.      Medication List        Accurate as of 06/15/18  9:41 AM. Always use your most recent med list.          exemestane 25 MG tablet Commonly known as:  AROMASIN Take 1 tablet (25 mg total) by mouth daily after breakfast.   traMADol 50 MG  tablet Commonly known as:  ULTRAM Take 1 tablet (50 mg total) by mouth every 6 (six) hours as needed for moderate pain or severe pain.        ROS:  A comprehensive review of systems was negative except for: Integument/breast: positive for h/o right breast cancer port a catheter in place on left subclavian vein  Blood pressure 111/62, pulse 65, temperature 97.8 F (36.6 C), temperature source Temporal, resp. rate 18, weight 131 lb 9.6 oz (59.7 kg). Physical Exam  Constitutional: She is oriented to person, place, and time. She appears well-developed and well-nourished.  HENT:  Head: Normocephalic.  Eyes: Pupils are equal, round, and reactive to light. EOM are normal.  Neck: Normal range of motion.  Cardiovascular: Normal rate and regular rhythm.  Pulmonary/Chest: Effort normal and breath sounds normal.  Left chest with port in place, well healed, no signs of infection  Abdominal: Soft. She exhibits no distension. There is no tenderness.  Musculoskeletal: Normal range of motion.  Neurological: She is alert and oriented to person, place, and time.  Skin: Skin is warm and dry.  Psychiatric: She has a normal mood and affect. Her behavior is normal. Judgment and thought content normal.  Vitals reviewed.   Results: 04/2018 Results for Angel Hale (MRN [id number]) as of 06/15/2018 14:27  Ref. Range 05/05/2018 12:13  WBC Latest Ref Range: 4.0 - 10.5 K/uL 4.7  RBC Latest Ref Range: 3.87 - 5.11 MIL/uL 3.85 (L)  Hemoglobin Latest Ref Range: 12.0 - 15.0 g/dL 12.6  HCT Latest Ref Range: 36.0 - 46.0 % 37.3  MCV Latest Ref Range: 78.0 - 100.0 fL 96.9  MCH Latest Ref Range: 26.0 - 34.0 pg 32.7  MCHC Latest Ref Range: 30.0 - 36.0 g/dL 33.8  RDW Latest Ref Range: 11.5 - 15.5 % 13.9  Platelets Latest Ref Range: 150 - 400 K/uL 215    Mm 3d Screen Breast Uni Left  Result Date: 06/14/2018 CLINICAL DATA:  Screening. EXAM: DIGITAL SCREENING UNILATERAL LEFT MAMMOGRAM WITH CAD AND TOMO  COMPARISON:  Previous exam(s). ACR Breast Density Category c: The breast tissue is heterogeneously dense, which may obscure small masses. FINDINGS: The patient has had a right mastectomy. There are no findings suspicious for malignancy. Images were processed with CAD. IMPRESSION: No mammographic evidence of malignancy. A result letter of this screening mammogram will be mailed directly to the patient. RECOMMENDATION: Screening mammogram in one year.  (Code:SM-L-5M) BI-RADS CATEGORY  1: Negative. Electronically Signed   By: Lillia Mountain M.D.   On: 06/14/2018 13:37     Assessment & Plan:  Lafonda C Weyland is a 47 y.o. female with a history of right breast cancer s/p treatment who is doing well and wants to get her port a catheter removed. She is doing well and feels well. She had a  mammogram for her remaining breast and this is negative.  -Removal of port a catheter on the left chest in the minor room, plan for local   All questions were answered to the satisfaction of the patient and family.  The risk and benefits of port a catheter removal were discussed including but not limited to bleeding, infection, risk of not being able to removal the port completely.  After careful consideration, [place] has decided to proceed.    Virl Cagey 06/15/2018, 9:41 AM

## 2018-06-15 NOTE — H&P (Signed)
[facility] History and Physical   CC: Port a Catheter in Place  Angel Hale is a 47 y.o. female.  HPI: Angel Hale is a 47 yo well known to me with a history of R breast cancer diagnosed last year s/p modified radical mastectomy after neoadjuvant therapy. She has completed her therapies and is here today for port a catheter removal options. She is doing well and has no complaints. She is feeling good, and had a recent screening mammogram on the left that was negative. She is overall in good spirits and is ready to get the port out. She has had no issues with the port, and has had no recent issues with bleeding or infection.  She is not on a blood thinner.   Past Medical History:  Diagnosis Date  . Cancer [facility])    right breast cancer  . History of radiation therapy 12/03/17- 01/13/18   Right Chest wall and supraclavicular region treated to 50 Gy with 25 fx of 2 Gy followed by a boost of 10 Gy in 5 fx.   . Personal history of chemotherapy   . Personal history of radiation therapy     Past Surgical History:  Procedure Laterality Date  . BUNIONECTOMY Left 2000  . EXTERNAL FIXATION LEG Left 02/16/2017   Procedure: EXTERNAL FIXATION LEG;  Surgeon: Mcarthur Rossetti, MD;  Location: [place];  Service: Orthopedics;  Laterality: Left;  . HARDWARE REMOVAL Left 05/21/2017   Procedure: REMOVAL OF RETAINED PROMINENT SCREW LEFT ANKLE;  Surgeon: Mcarthur Rossetti, MD;  Location: [place];  Service: Orthopedics;  Laterality: Left;  . LAPAROSCOPIC BILATERAL SALPINGO OOPHERECTOMY Bilateral 03/16/2018   Procedure: LAPAROSCOPIC BILATERAL SALPINGO OOPHORECTOMY;  Surgeon: Jonnie Kind, MD;  Location: [facility];  Service: Gynecology;  Laterality: Bilateral;  . LAPAROSCOPIC LYSIS OF ADHESIONS N/A 03/16/2018   Procedure: LAPAROSCOPIC LYSIS OF ADHESIONS;  Surgeon: Jonnie Kind, MD;  Location: [facility];  Service: Gynecology;  Laterality: N/A;  . MASTECTOMY MODIFIED RADICAL Right  10/14/2017   Procedure: MASTECTOMY MODIFIED RADICAL;  Surgeon: Virl Cagey, MD;  Location: [facility];  Service: General;  Laterality: Right;  . ORIF ANKLE FRACTURE Left 03/03/2017  . ORIF ANKLE FRACTURE Left 03/03/2017   Procedure: OPEN REDUCTION INTERNAL FIXATION (ORIF) LEFT PILON FRACTURE AND LEFT LATERAL MALLEOLUS;  Surgeon: Mcarthur Rossetti, MD;  Location: [place];  Service: Orthopedics;  Laterality: Left;  . OVARIAN CYST REMOVAL Left 1991  . PORTACATH PLACEMENT    . PORTACATH PLACEMENT Left 05/20/2017   Procedure: INSERTION PORT-A-CATH;  Surgeon: Virl Cagey, MD;  Location: [facility];  Service: General;  Laterality: Left;  . WISDOM TOOTH EXTRACTION      Family History  Problem Relation Age of Onset  . Colon cancer Father     Social History   Tobacco Use  . Smoking status: Former Smoker    Packs/day: 1.00    Years: 10.00    Pack years: 10.00    Types: Cigarettes    Last attempt to quit: 02/16/2017    Years since quitting: 1.3  . Smokeless tobacco: Never Used  Substance Use Topics  . Alcohol use: Yes    Comment: occasional  . Drug use: No    Medications: I have reviewed the patient's current medications. Allergies as of 06/15/2018      Reactions   Penicillins Nausea And Vomiting   Has patient had a PCN reaction causing immediate rash, facial/tongue/throat swelling, SOB or lightheadedness with hypotension: No  Has patient had a PCN reaction causing severe rash involving mucus membranes or skin necrosis: No Has patient had a PCN reaction that required hospitalization: No Has patient had a PCN reaction occurring within the last 10 years: No If all of the above answers are "NO", then may proceed with Cephalosporin use.      Medication List        Accurate as of 06/15/18  9:41 AM. Always use your most recent med list.          exemestane 25 MG tablet Commonly known as:  AROMASIN Take 1 tablet (25 mg total) by mouth daily after breakfast.   traMADol 50 MG  tablet Commonly known as:  ULTRAM Take 1 tablet (50 mg total) by mouth every 6 (six) hours as needed for moderate pain or severe pain.        ROS:  A comprehensive review of systems was negative except for: Integument/breast: positive for h/o right breast cancer port a catheter in place on left subclavian vein  Blood pressure 111/62, pulse 65, temperature 97.8 F (36.6 C), temperature source Temporal, resp. rate 18, weight 131 lb 9.6 oz (59.7 kg). Physical Exam  Constitutional: She is oriented to person, place, and time. She appears well-developed and well-nourished.  HENT:  Head: Normocephalic.  Eyes: Pupils are equal, round, and reactive to light. EOM are normal.  Neck: Normal range of motion.  Cardiovascular: Normal rate and regular rhythm.  Pulmonary/Chest: Effort normal and breath sounds normal.  Left chest with port in place, well healed, no signs of infection  Abdominal: Soft. She exhibits no distension. There is no tenderness.  Musculoskeletal: Normal range of motion.  Neurological: She is alert and oriented to person, place, and time.  Skin: Skin is warm and dry.  Psychiatric: She has a normal mood and affect. Her behavior is normal. Judgment and thought content normal.  Vitals reviewed.   Results: 04/2018 Results for Angel Hale, Angel Hale (MRN [id number]) as of 06/15/2018 14:27  Ref. Range 05/05/2018 12:13  WBC Latest Ref Range: 4.0 - 10.5 K/uL 4.7  RBC Latest Ref Range: 3.87 - 5.11 MIL/uL 3.85 (L)  Hemoglobin Latest Ref Range: 12.0 - 15.0 g/dL 12.6  HCT Latest Ref Range: 36.0 - 46.0 % 37.3  MCV Latest Ref Range: 78.0 - 100.0 fL 96.9  MCH Latest Ref Range: 26.0 - 34.0 pg 32.7  MCHC Latest Ref Range: 30.0 - 36.0 g/dL 33.8  RDW Latest Ref Range: 11.5 - 15.5 % 13.9  Platelets Latest Ref Range: 150 - 400 K/uL 215    Mm 3d Screen Breast Uni Left  Result Date: 06/14/2018 CLINICAL DATA:  Screening. EXAM: DIGITAL SCREENING UNILATERAL LEFT MAMMOGRAM WITH CAD AND TOMO  COMPARISON:  Previous exam(s). ACR Breast Density Category c: The breast tissue is heterogeneously dense, which may obscure small masses. FINDINGS: The patient has had a right mastectomy. There are no findings suspicious for malignancy. Images were processed with CAD. IMPRESSION: No mammographic evidence of malignancy. A result letter of this screening mammogram will be mailed directly to the patient. RECOMMENDATION: Screening mammogram in one year.  (Code:SM-L-49M) BI-RADS CATEGORY  1: Negative. Electronically Signed   By: Lillia Mountain M.D.   On: 06/14/2018 13:37     Assessment & Plan:  Angel Hale is a 47 y.o. female with a history of right breast cancer s/p treatment who is doing well and wants to get her port a catheter removed. She is doing well and feels well. She had a  mammogram for her remaining breast and this is negative.  -Removal of port a catheter on the left chest in the minor room, plan for local   All questions were answered to the satisfaction of the patient and family.  The risk and benefits of port a catheter removal were discussed including but not limited to bleeding, infection, risk of not being able to removal the port completely.  After careful consideration, Angel Hale has decided to proceed.    Virl Cagey 06/15/2018, 9:41 AM

## 2018-06-18 ENCOUNTER — Other Ambulatory Visit: Payer: Self-pay

## 2018-06-18 ENCOUNTER — Ambulatory Visit (HOSPITAL_COMMUNITY): Payer: Self-pay

## 2018-06-18 ENCOUNTER — Encounter (HOSPITAL_COMMUNITY)
Admission: RE | Disposition: A | Discharge status: Home / Self Care | Payer: Self-pay | Source: Ambulatory Visit | Attending: General Surgery

## 2018-06-18 ENCOUNTER — Encounter (HOSPITAL_COMMUNITY): Payer: Self-pay | Admitting: *Deleted

## 2018-06-18 ENCOUNTER — Ambulatory Visit (HOSPITAL_COMMUNITY)
Admission: RE | Admit: 2018-06-18 | Discharge: 2018-06-18 | Disposition: A | Discharge status: Home / Self Care | Payer: Self-pay | Source: Ambulatory Visit | Attending: General Surgery | Admitting: General Surgery

## 2018-06-18 DIAGNOSIS — Z95828 Presence of other vascular implants and grafts: Secondary | ICD-10-CM

## 2018-06-18 DIAGNOSIS — C50811 Malignant neoplasm of overlapping sites of right female breast: Secondary | ICD-10-CM | POA: Diagnosis present

## 2018-06-18 DIAGNOSIS — Z853 Personal history of malignant neoplasm of breast: Secondary | ICD-10-CM | POA: Insufficient documentation

## 2018-06-18 DIAGNOSIS — Z9221 Personal history of antineoplastic chemotherapy: Secondary | ICD-10-CM | POA: Insufficient documentation

## 2018-06-18 DIAGNOSIS — Z87891 Personal history of nicotine dependence: Secondary | ICD-10-CM | POA: Insufficient documentation

## 2018-06-18 DIAGNOSIS — Z452 Encounter for adjustment and management of vascular access device: Secondary | ICD-10-CM | POA: Insufficient documentation

## 2018-06-18 DIAGNOSIS — Z923 Personal history of irradiation: Secondary | ICD-10-CM | POA: Insufficient documentation

## 2018-06-18 DIAGNOSIS — Z9889 Other specified postprocedural states: Secondary | ICD-10-CM

## 2018-06-18 DIAGNOSIS — Z9011 Acquired absence of right breast and nipple: Secondary | ICD-10-CM | POA: Insufficient documentation

## 2018-06-18 HISTORY — PX: PORT-A-CATH REMOVAL: SHX5289

## 2018-06-18 SURGERY — MINOR REMOVAL PORT-A-CATH
Anesthesia: LOCAL | Laterality: Left

## 2018-06-18 MED ORDER — LIDOCAINE HCL (PF) 1 % IJ SOLN
INTRAMUSCULAR | Status: DC | PRN
  Administered 2018-06-18: 10 mL

## 2018-06-18 MED ORDER — LIDOCAINE HCL (PF) 1 % IJ SOLN
INTRAMUSCULAR | Status: AC
  Filled 2018-06-18: qty 30

## 2018-06-18 SURGICAL SUPPLY — 17 items
ADH SKN CLS APL DERMABOND .7 (GAUZE/BANDAGES/DRESSINGS) ×1
CHLORAPREP W/TINT 10.5 ML (MISCELLANEOUS) ×2 IMPLANT
CLOTH BEACON ORANGE TIMEOUT ST (SAFETY) ×2 IMPLANT
DECANTER SPIKE VIAL GLASS SM (MISCELLANEOUS) ×2 IMPLANT
DERMABOND ADVANCED (GAUZE/BANDAGES/DRESSINGS) ×1
DERMABOND ADVANCED .7 DNX12 (GAUZE/BANDAGES/DRESSINGS) ×1 IMPLANT
DRAPE PROXIMA HALF (DRAPES) ×1 IMPLANT
GLOVE BIO SURGEON STRL SZ 6.5 (GLOVE) ×2 IMPLANT
GLOVE BIOGEL PI IND STRL 6.5 (GLOVE) ×1 IMPLANT
GLOVE BIOGEL PI IND STRL 7.0 (GLOVE) ×1 IMPLANT
GLOVE BIOGEL PI INDICATOR 6.5 (GLOVE) ×1
GLOVE BIOGEL PI INDICATOR 7.0 (GLOVE) ×1
GOWN STRL REUS W/TWL LRG LVL3 (GOWN DISPOSABLE) ×2 IMPLANT
SPONGE GAUZE 2X2 8PLY STRL LF (GAUZE/BANDAGES/DRESSINGS) ×2 IMPLANT
SUT MNCRL AB 4-0 PS2 18 (SUTURE) ×2 IMPLANT
SUT VIC AB 3-0 SH 27 (SUTURE) ×2
SUT VIC AB 3-0 SH 27X BRD (SUTURE) ×1 IMPLANT

## 2018-06-18 NOTE — Discharge Instructions (Signed)
Leave dressing in place for 48 hours, if gets saturated, change. You can shower prior to that with the dressing in place. After 48 hours, you can remove the dressing, and do not have to reapply.  Expect some possible bloody drainage.   Implanted Port Removal Implanted port removal is a procedure to remove the port and catheter (port-a-cath) that is implanted under your skin. The port is a small disc under your skin that can be punctured with a needle. It is connected to a vein in your chest or neck by a small flexible tube (catheter). The port-a-cath is used for treatment through an IV tube and for taking blood samples. Your health care provider will remove the port-a-cath if:  You no longer need it for treatment.  It is not working properly.  The area around it gets infected.  Tell a health care provider about:  Any allergies you have.  All medicines you are taking, including vitamins, herbs, eye drops, creams, and over-the-counter medicines.  Any problems you or family members have had with anesthetic medicines.  Any blood disorders you have.  Any surgeries you have had.  Any medical conditions you have.  Whether you are pregnant or may be pregnant. What are the risks? Generally, this is a safe procedure. However, problems may occur, including:  Infection.  Bleeding.  Allergic reactions to anesthetic medicines.  Damage to nerves or blood vessels.  What happens before the procedure?  You will have: ? A physical exam. ? Blood tests. ? Imaging tests, including a chest X-ray.  Follow instructions from your health care provider about eating or drinking restrictions.  Ask your health care provider about: ? Changing or stopping your regular medicines. This is especially important if you are taking diabetes medicines or blood thinners. ? Taking medicines such as aspirin and ibuprofen. These medicines can thin your blood. Do not take these medicines before your procedure  if your surgeon instructs you not to.  Ask your health care provider how your surgical site will be marked or identified.  You may be given antibiotic medicine to help prevent infection.  Plan to have someone take you home after the procedure.  If you will be going home right after the procedure, plan to have someone stay with you for 24 hours. What happens during the procedure?  To reduce your risk of infection: ? Your health care team will wash or sanitize their hands. ? Your skin will be washed with soap.  You may be given one or more of the following: ? A medicine to help you relax (sedative). ? A medicine to numb the area (local anesthetic).  A small cut (incision) will be made at the site of your port-a-cath.  The port-a-cath and the catheter that has been inside your vein will gently be removed.  The incision will be closed with stitches (sutures), adhesive strips, or skin glue.  A bandage (dressing) will be placed over the incision. The procedure may vary among health care providers and hospitals. What happens after the procedure?  Your blood pressure, heart rate, breathing rate, and blood oxygen level will be monitored often until the medicines you were given have worn off.  Do not drive for 24 hours if you received a sedative. This information is not intended to replace advice given to you by your health care provider. Make sure you discuss any questions you have with your health care provider. Document Released: 07/23/2015 Document Revised: 01/17/2016 Document Reviewed: 05/16/2015 Elsevier Interactive Patient  Education  2018 [place], Adult An incision is a cut that a doctor makes in your skin for surgery (for a procedure). Most times, these cuts are closed after surgery. Your cut from surgery may be closed with stitches (sutures), staples, skin glue, or skin tape (adhesive strips). You may need to return to your doctor to have stitches or  staples taken out. This may happen many days or many weeks after your surgery. The cut needs to be well cared for so it does not get infected. How to care for your cut Cut care  Follow instructions from your doctor about how to take care of your cut. Make sure you: ? Wash your hands with soap and water before you change your bandage (dressing). If you cannot use soap and water, use hand sanitizer. ? Change your bandage as told by your doctor. ? Leave stitches, skin glue, or skin tape in place. They may need to stay in place for 2 weeks or longer. If tape strips get loose and curl up, you may trim the loose edges. Do not remove tape strips completely unless your doctor says it is okay.  Check your cut area every day for signs of infection. Check for: ? More redness, swelling, or pain. ? More fluid or blood. ? Warmth. ? Pus or a bad smell.  Ask your doctor how to clean the cut. This may include: ? Using mild soap and water. ? Using a clean towel to pat the cut dry after you clean it. ? Putting a cream or ointment on the cut. Do this only as told by your doctor. ? Covering the cut with a clean bandage.  Ask your doctor when you can leave the cut uncovered.  Do not take baths, swim, or use a hot tub until your doctor says it is okay. Ask your doctor if you can take showers. You may only be allowed to take sponge baths for bathing. Medicines  If you were prescribed an antibiotic medicine, cream, or ointment, take the antibiotic or put it on the cut as told by your doctor. Do not stop taking or putting on the antibiotic even if your condition gets better.  Take over-the-counter and prescription medicines only as told by your doctor. General instructions  Limit movement around your cut. This helps healing. ? Avoid straining, lifting, or exercise for the first month, or for as long as told by your doctor. ? Follow instructions from your doctor about going back to your normal  activities. ? Ask your doctor what activities are safe.  Protect your cut from the sun when you are outside for the first 6 months, or for as long as told by your doctor. Put on sunscreen around the scar or cover up the scar.  Keep all follow-up visits as told by your doctor. This is important. Contact a doctor if:  Your have more redness, swelling, or pain around the cut.  You have more fluid or blood coming from the cut.  Your cut feels warm to the touch.  You have pus or a bad smell coming from the cut.  You have a fever or shaking chills.  You feel sick to your stomach (nauseous) or you throw up (vomit).  You are dizzy.  Your stitches or staples come undone. Get help right away if:  You have a red streak coming from your cut.  Your cut bleeds through the bandage and the bleeding does not stop with  gentle pressure.  The edges of your cut open up and separate.  You have very bad (severe) pain.  You have a rash.  You are confused.  You pass out (faint).  You have trouble breathing and you have a fast heartbeat. This information is not intended to replace advice given to you by your health care provider. Make sure you discuss any questions you have with your health care provider. Document Released: 11/03/2011 Document Revised: 04/18/2016 Document Reviewed: 04/18/2016 Elsevier Interactive Patient Education  2017 [employer].

## 2018-06-18 NOTE — Op Note (Signed)
[facility] Operative Note  06/18/18  Preoperative Diagnosis:  Port a catheter in place    Postoperative Diagnosis: Same   Procedure(s) Performed: Removal of Port a catheter    Surgeon: Lanell Matar. Constance Haw, MD   Assistants: No qualified resident was available    Anesthesia: Local 1% Lidocaine    Specimens: None    Estimated Blood Loss: Minimal   Blood Replacement: None    Complications: None   Wound Class: Clean    Operative Indications: Angel Hale is a 47 yo with a history of right breast cancer s/p treatment, who wants her port a catheter out.  She has been doing well and has been cleared to get this removed.  After a discussion of the risk and benefits of removal, including but not limited to bleeding, infection, risk of incomplete removal of the catheter, she opted to proceed.   Findings: Intact port a catheter   Procedure: The patient was taken to the minor procedure room and placed semi upright position.  The left chest was prepped and draped in the normal sterile fashion.  Lidocaine was used to anesthetize the area.  An incision was made over the port a catheter and carried down through the subcutaneous tissue.  The capsule was opened, and the prolene sutures that were securing the catheter were cut and removed.  The capsule was further removed from the port, and the port was eviscerated from the cavity.  My RN assistant held pressure at the insertion site under the clavicle, and I removed the catheter, which was intact.  This pressure was held for 10 minutes.  I irrigated the cavity, and closed it with 3-0 Vicryl suture and 4-0 Monocryl subcuticular suture.  There was some minor oozing bleeding from the incision site, and pressure was held.  Given the oozing, a 2X2 gauze and tegaderm were placed over the incision to allow for drainage as needed. The patient tolerated the procedure well and a CXR was obtained to confirm complete removal of the catheter.      Angel Labrum, MD [facility] [address] [place], [place] [address] [phone] (office)

## 2018-06-21 ENCOUNTER — Encounter (HOSPITAL_COMMUNITY): Payer: Self-pay | Admitting: General Surgery

## 2018-06-29 ENCOUNTER — Other Ambulatory Visit (HOSPITAL_COMMUNITY): Payer: Self-pay | Admitting: Internal Medicine

## 2018-06-29 ENCOUNTER — Other Ambulatory Visit (HOSPITAL_COMMUNITY): Payer: Self-pay | Admitting: *Deleted

## 2018-07-02 ENCOUNTER — Other Ambulatory Visit (HOSPITAL_COMMUNITY): Payer: Self-pay | Admitting: *Deleted

## 2018-07-02 ENCOUNTER — Other Ambulatory Visit: Payer: Self-pay | Admitting: Obstetrics and Gynecology

## 2018-07-04 NOTE — Telephone Encounter (Signed)
Tramadol refil request approved as pt is recently postop.

## 2018-07-05 ENCOUNTER — Inpatient Hospital Stay (HOSPITAL_COMMUNITY): Payer: Self-pay | Attending: Hematology

## 2018-07-05 DIAGNOSIS — Z87891 Personal history of nicotine dependence: Secondary | ICD-10-CM | POA: Insufficient documentation

## 2018-07-05 DIAGNOSIS — C50911 Malignant neoplasm of unspecified site of right female breast: Secondary | ICD-10-CM

## 2018-07-05 DIAGNOSIS — C50811 Malignant neoplasm of overlapping sites of right female breast: Secondary | ICD-10-CM | POA: Insufficient documentation

## 2018-07-05 DIAGNOSIS — R5383 Other fatigue: Secondary | ICD-10-CM | POA: Insufficient documentation

## 2018-07-05 DIAGNOSIS — M25572 Pain in left ankle and joints of left foot: Secondary | ICD-10-CM | POA: Insufficient documentation

## 2018-07-05 DIAGNOSIS — C773 Secondary and unspecified malignant neoplasm of axilla and upper limb lymph nodes: Secondary | ICD-10-CM | POA: Insufficient documentation

## 2018-07-05 DIAGNOSIS — Z923 Personal history of irradiation: Secondary | ICD-10-CM | POA: Insufficient documentation

## 2018-07-05 DIAGNOSIS — Z9221 Personal history of antineoplastic chemotherapy: Secondary | ICD-10-CM | POA: Insufficient documentation

## 2018-07-05 DIAGNOSIS — Z79811 Long term (current) use of aromatase inhibitors: Secondary | ICD-10-CM | POA: Insufficient documentation

## 2018-07-05 DIAGNOSIS — Z79899 Other long term (current) drug therapy: Secondary | ICD-10-CM | POA: Insufficient documentation

## 2018-07-05 DIAGNOSIS — Z170 Estrogen receptor positive status [ER+]: Secondary | ICD-10-CM | POA: Insufficient documentation

## 2018-07-05 DIAGNOSIS — Z9011 Acquired absence of right breast and nipple: Secondary | ICD-10-CM | POA: Insufficient documentation

## 2018-07-05 LAB — CBC WITH DIFFERENTIAL/PLATELET
Abs Immature Granulocytes: 0.01 10*3/uL (ref 0.00–0.07)
Basophils Absolute: 0 10*3/uL (ref 0.0–0.1)
Basophils Relative: 0 %
EOS PCT: 1 %
Eosinophils Absolute: 0 10*3/uL (ref 0.0–0.5)
HEMATOCRIT: 40.6 % (ref 36.0–46.0)
HEMOGLOBIN: 13.4 g/dL (ref 12.0–15.0)
Immature Granulocytes: 0 %
LYMPHS PCT: 19 %
Lymphs Abs: 1.2 10*3/uL (ref 0.7–4.0)
MCH: 31.6 pg (ref 26.0–34.0)
MCHC: 33 g/dL (ref 30.0–36.0)
MCV: 95.8 fL (ref 80.0–100.0)
Monocytes Absolute: 0.3 10*3/uL (ref 0.1–1.0)
Monocytes Relative: 5 %
Neutro Abs: 4.5 10*3/uL (ref 1.7–7.7)
Neutrophils Relative %: 75 %
Platelets: 277 10*3/uL (ref 150–400)
RBC: 4.24 MIL/uL (ref 3.87–5.11)
RDW: 13.8 % (ref 11.5–15.5)
WBC: 6.1 10*3/uL (ref 4.0–10.5)
nRBC: 0 % (ref 0.0–0.2)

## 2018-07-05 LAB — COMPREHENSIVE METABOLIC PANEL
ALBUMIN: 4.5 g/dL (ref 3.5–5.0)
ALK PHOS: 68 U/L (ref 38–126)
ALT: 15 U/L (ref 0–44)
AST: 19 U/L (ref 15–41)
Anion gap: 8 (ref 5–15)
BUN: 10 mg/dL (ref 6–20)
CALCIUM: 9.6 mg/dL (ref 8.9–10.3)
CO2: 26 mmol/L (ref 22–32)
CREATININE: 0.81 mg/dL (ref 0.44–1.00)
Chloride: 106 mmol/L (ref 98–111)
GFR calc Af Amer: >60 mL/min (ref >60)
GFR calc non Af Amer: >60 mL/min (ref >60)
GLUCOSE: 91 mg/dL (ref 70–99)
Potassium: 3.7 mmol/L (ref 3.5–5.1)
Sodium: 140 mmol/L (ref 135–145)
Total Bilirubin: 0.9 mg/dL (ref 0.3–1.2)
Total Protein: 7.8 g/dL (ref 6.5–8.1)

## 2018-07-07 ENCOUNTER — Encounter (HOSPITAL_COMMUNITY): Payer: Self-pay | Admitting: Hematology

## 2018-07-07 ENCOUNTER — Other Ambulatory Visit: Payer: Self-pay

## 2018-07-07 ENCOUNTER — Inpatient Hospital Stay (HOSPITAL_COMMUNITY): Payer: Self-pay | Attending: Hematology | Admitting: Hematology

## 2018-07-07 VITALS — BP 102/47 | HR 70 | Temp 98.2°F | Resp 18 | Wt 129.0 lb

## 2018-07-07 DIAGNOSIS — C50811 Malignant neoplasm of overlapping sites of right female breast: Secondary | ICD-10-CM | POA: Insufficient documentation

## 2018-07-07 DIAGNOSIS — G8929 Other chronic pain: Secondary | ICD-10-CM

## 2018-07-07 DIAGNOSIS — Z9221 Personal history of antineoplastic chemotherapy: Secondary | ICD-10-CM | POA: Insufficient documentation

## 2018-07-07 DIAGNOSIS — Z9011 Acquired absence of right breast and nipple: Secondary | ICD-10-CM | POA: Insufficient documentation

## 2018-07-07 DIAGNOSIS — R5383 Other fatigue: Secondary | ICD-10-CM | POA: Insufficient documentation

## 2018-07-07 DIAGNOSIS — R21 Rash and other nonspecific skin eruption: Secondary | ICD-10-CM | POA: Insufficient documentation

## 2018-07-07 DIAGNOSIS — Z79899 Other long term (current) drug therapy: Secondary | ICD-10-CM | POA: Insufficient documentation

## 2018-07-07 DIAGNOSIS — C50911 Malignant neoplasm of unspecified site of right female breast: Secondary | ICD-10-CM

## 2018-07-07 DIAGNOSIS — M25572 Pain in left ankle and joints of left foot: Secondary | ICD-10-CM

## 2018-07-07 DIAGNOSIS — Z87891 Personal history of nicotine dependence: Secondary | ICD-10-CM | POA: Insufficient documentation

## 2018-07-07 DIAGNOSIS — C773 Secondary and unspecified malignant neoplasm of axilla and upper limb lymph nodes: Secondary | ICD-10-CM | POA: Insufficient documentation

## 2018-07-07 DIAGNOSIS — Z79811 Long term (current) use of aromatase inhibitors: Secondary | ICD-10-CM | POA: Insufficient documentation

## 2018-07-07 DIAGNOSIS — Z170 Estrogen receptor positive status [ER+]: Secondary | ICD-10-CM | POA: Insufficient documentation

## 2018-07-07 DIAGNOSIS — Z923 Personal history of irradiation: Secondary | ICD-10-CM | POA: Insufficient documentation

## 2018-07-07 MED ORDER — TRAMADOL HCL 50 MG PO TABS: ORAL_TABLET | ORAL | 0 refills | Status: DC

## 2018-07-07 NOTE — Assessment & Plan Note (Signed)
1.  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 -As she has a high risk disease (based on SOFT and TEXT trails), I have recommended ovarian ablation with the aromatase inhibitor therapy (preferably exemestane). Patient is pre-menopausal.  I have recommended surgical oophorectomy. -Bilateral oophorectomy done on 03/16/2018 -She completed last dose of Herceptin and Pertuzumab on 05/26/2018. - Exemestane 25 mg daily was started on 05/26/2018. - We discussed the results of the left breast mammogram dated 06/14/2018 which was BI-RADS Category 1. - She was counseled to do physical exercise most days of the week.  She will continue calcium and vitamin D supplements. -We will see her back in 4 months for follow-up.  2.  High risk drug monitoring for Herceptin: She does not have any clinical signs or symptoms of CHF.  Echocardiogram on 02/22/2018 reviewed by me showed ejection fraction of 60 to 65%.  3.  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  She does have on and off numbness in the fingertips.  Even though she was prescribed gabapentin 100 mg 3 times daily, she never took it because of concern for side effects.  She does have left ankle pain for which she takes tramadol as needed.

## 2018-07-09 ENCOUNTER — Encounter (HOSPITAL_COMMUNITY): Payer: Self-pay | Admitting: Hematology

## 2018-07-09 NOTE — Progress Notes (Signed)
Angel Hale, [place] [id number]   CLINIC:  Medical Oncology/Hematology  PCP:  Patient, No Pcp Per No address on file None   REASON FOR VISIT: Follow-up for breast cancer, ER+/PR+/HER2+  CURRENT THERAPY: Exemestane 25 mg daily.  BRIEF ONCOLOGIC HISTORY:    Malignant neoplasm of overlapping sites of right breast in female, estrogen receptor positive ([place])   05/12/2017 Initial Biopsy    (R) breast (LIQ): IDC, grade 3; ER+ (80%), PR+ (10%), HER2+ (ratio 2.56). Ki67 80%.      05/12/2017 Procedure    Additional (R) breast biopsy (UOQ): IDC, grade 3; ER+ (80%), PR+ (50%), HER2+ (ratio 2.77), Ki67 90%.     05/12/2017 Procedure    (R) axillary lymph node biopsy: Metastatic carcinoma; ER+ (80%), PR+ (30%), HER2+ (ratio 2.99), Ki67 80%.     05/12/2017 Imaging    Mammogram/breast US: Targeted ultrasound is performed, showing multiple irregular hypoechoic masses. In the 6 o'clock region of the right breast is a large irregular mass spans at least 4.8 cm. In the 4:00 to 5:00 region of the right breast an irregular mass measures 4.5 cm. In the 930-10 o'clock position of the right breast centered 9-10 cm from the nipple is an irregular 4.0 x 3.8 x 1.9 cm hypoechoic mass. In the 9:30 position of the right breast at the lateral margin is a probable abnormal intramammary lymph node measuring 1.3 cm greatest diameter. In the 10 o'clock position of the right breast far lateral is a hypoechoic dermal nodule that measures 0.5 cm. Diffuse skin thickening of the right breast measures up to 4.5 mm.    05/14/2017 Tumor Marker      Ref. Range 05/14/2017 15:30  CA 15-3 Latest Ref Range: 0.0 - 25.0 U/mL 115.9 (H)     Ref. Range 05/14/2017 15:30  CA 27.29 Latest Ref Range: 0.0 - 38.6 U/mL 157.7 (H)      05/15/2017 Imaging    CT chest/abd/pelvis:  IMPRESSION: 1. Numerous right breast masses and metastatic right axillary lymphadenopathy. Findings suspicious for chest wall  invasion along the lower lateral aspect of the breast. 2. Two small indeterminate right lower lobe pulmonary nodules will require surveillance. 3. No findings for metastatic disease involving the abdomen/pelvis or osseous structures.    05/15/2017 Imaging    Bone scan:  IMPRESSION: No evidence of metastatic disease.    05/18/2017 Echocardiogram    LV EF: 60% -   65%    05/20/2017 Procedure    Port-a-cath placement     05/25/2017 Initial Diagnosis    Malignant neoplasm of overlapping sites of right breast in female, estrogen receptor positive ([place])    05/27/2017 - 09/10/2017 Neo-Adjuvant Chemotherapy    TCHPx 6 cycles    10/14/2017 Surgery    Right modified radical mastectomy and lymph node biopsy on 10/14/2017, with complete pathological response, [id number]      CANCER STAGING: Cancer Staging Malignant neoplasm of overlapping sites of right breast in female, estrogen receptor positive ([place]) Staging form: Breast, AJCC 8th Edition - Clinical stage from 05/25/2017: Stage IIIB (cT4b, cN3a, cM0, G3, ER+, PR+, HER2+) - Signed by Holley Bouche, NP on 07/07/2017  Malignant neoplasm of overlapping sites of right female breast [facility]) Staging form: Breast, AJCC 8th Edition - Clinical: No Stage Recommended (ycT4b, cN3a, cM0, G3, ER: Positive, PR: Positive, HER2: Positive) - Signed by Twana First, MD on 05/25/2017    INTERVAL HISTORY:  Angel Hale 47 y.o. female returns for follow-up of  her breast cancer.  She is tolerating exemestane very well.  She completed Herceptin and Pertuzumab on 05/26/2018.  She had a left mammogram done on 06/14/2018.  She had her port removed on 06/18/2018.  Denies any major hot flashes.  She complains of feeling somewhat tired.  Her ankle bothers her now and then.   REVIEW OF SYSTEMS:  Review of Systems  Constitutional: Positive for fatigue.  Skin: Negative for itching and rash (upper back and shoulders).  All other systems reviewed and are  negative.    PAST MEDICAL/SURGICAL HISTORY:  Past Medical History:  Diagnosis Date  . Cancer [facility])    right breast cancer  . History of radiation therapy 12/03/17- 01/13/18   Right Chest wall and supraclavicular region treated to 50 Gy with 25 fx of 2 Gy followed by a boost of 10 Gy in 5 fx.   . Personal history of chemotherapy   . Personal history of radiation therapy    Past Surgical History:  Procedure Laterality Date  . BUNIONECTOMY Left 2000  . EXTERNAL FIXATION LEG Left 02/16/2017   Procedure: EXTERNAL FIXATION LEG;  Surgeon: Mcarthur Rossetti, MD;  Location: [place];  Service: Orthopedics;  Laterality: Left;  . HARDWARE REMOVAL Left 05/21/2017   Procedure: REMOVAL OF RETAINED PROMINENT SCREW LEFT ANKLE;  Surgeon: Mcarthur Rossetti, MD;  Location: [place];  Service: Orthopedics;  Laterality: Left;  . LAPAROSCOPIC BILATERAL SALPINGO OOPHERECTOMY Bilateral 03/16/2018   Procedure: LAPAROSCOPIC BILATERAL SALPINGO OOPHORECTOMY;  Surgeon: Jonnie Kind, MD;  Location: [facility];  Service: Gynecology;  Laterality: Bilateral;  . LAPAROSCOPIC LYSIS OF ADHESIONS N/A 03/16/2018   Procedure: LAPAROSCOPIC LYSIS OF ADHESIONS;  Surgeon: Jonnie Kind, MD;  Location: [facility];  Service: Gynecology;  Laterality: N/A;  . MASTECTOMY MODIFIED RADICAL Right 10/14/2017   Procedure: MASTECTOMY MODIFIED RADICAL;  Surgeon: Virl Cagey, MD;  Location: [facility];  Service: General;  Laterality: Right;  . ORIF ANKLE FRACTURE Left 03/03/2017  . ORIF ANKLE FRACTURE Left 03/03/2017   Procedure: OPEN REDUCTION INTERNAL FIXATION (ORIF) LEFT PILON FRACTURE AND LEFT LATERAL MALLEOLUS;  Surgeon: Mcarthur Rossetti, MD;  Location: [place];  Service: Orthopedics;  Laterality: Left;  . OVARIAN CYST REMOVAL Left 1991  . PORT-A-CATH REMOVAL Left 06/18/2018   Procedure: MINOR REMOVAL PORT-A-CATH;  Surgeon: Virl Cagey, MD;  Location: [facility];  Service: General;  Laterality: Left;  . PORTACATH PLACEMENT     . PORTACATH PLACEMENT Left 05/20/2017   Procedure: INSERTION PORT-A-CATH;  Surgeon: Virl Cagey, MD;  Location: [facility];  Service: General;  Laterality: Left;  . WISDOM TOOTH EXTRACTION       SOCIAL HISTORY:  Social History   Socioeconomic History  . Marital status: Married    Spouse name: Not on file  . Number of children: Not on file  . Years of education: Not on file  . Highest education level: Not on file  Occupational History  . Not on file  Social Needs  . Financial resource strain: Not on file  . Food insecurity:    Worry: Not on file    Inability: Not on file  . Transportation needs:    Medical: Not on file    Non-medical: Not on file  Tobacco Use  . Smoking status: Former Smoker    Packs/day: 1.00    Years: 10.00    Pack years: 10.00    Types: Cigarettes    Last attempt to quit: 02/16/2017    Years since  quitting: 1.3  . Smokeless tobacco: Never Used  Substance and Sexual Activity  . Alcohol use: Yes    Comment: occasional  . Drug use: No  . Sexual activity: Yes    Birth control/protection: None  Lifestyle  . Physical activity:    Days per week: Not on file    Minutes per session: Not on file  . Stress: Not on file  Relationships  . Social connections:    Talks on phone: Not on file    Gets together: Not on file    Attends religious service: Not on file    Active member of club or organization: Not on file    Attends meetings of clubs or organizations: Not on file    Relationship status: Not on file  . Intimate partner violence:    Fear of current or ex partner: Not on file    Emotionally abused: Not on file    Physically abused: Not on file    Forced sexual activity: Not on file  Other Topics Concern  . Not on file  Social History Narrative  . Not on file    FAMILY HISTORY:  Family History  Problem Relation Age of Onset  . Colon cancer Father     CURRENT MEDICATIONS:  Outpatient Encounter Medications as of 07/07/2018  Medication  Sig  . acetaminophen (TYLENOL) 500 MG tablet Take 1,000 mg by mouth daily as needed for moderate pain or headache.  . exemestane (AROMASIN) 25 MG tablet Take 1 tablet (25 mg total) by mouth daily after breakfast.  . traMADol (ULTRAM) 50 MG tablet TAKE (1) TABLET BY MOUTH EVERY SIX HOURS AS NEEDED FOR SEVERE PAIN.  . [DISCONTINUED] traMADol (ULTRAM) 50 MG tablet TAKE (1) TABLET BY MOUTH EVERY SIX HOURS AS NEEDED FOR SEVERE PAIN.   No facility-administered encounter medications on file as of 07/07/2018.     ALLERGIES:  Allergies  Allergen Reactions  . Penicillins Nausea And Vomiting    Has patient had a PCN reaction causing immediate rash, facial/tongue/throat swelling, SOB or lightheadedness with hypotension: No Has patient had a PCN reaction causing severe rash involving mucus membranes or skin necrosis: No Has patient had a PCN reaction that required hospitalization: No Has patient had a PCN reaction occurring within the last 10 years: No If all of the above answers are "NO", then may proceed with Cephalosporin use.      PHYSICAL EXAM:  ECOG Performance status: 1  VITAL SIGNS:BP 102/60, P:53, R:18, T:98.2, SATS:100% Weight: 131.4  Physical Exam  Constitutional: She is oriented to person, place, and time. She appears well-developed and well-nourished.  Cardiovascular: Normal rate, regular rhythm and normal heart sounds.  Pulmonary/Chest: Effort normal and breath sounds normal.  Abdominal: Soft.  Musculoskeletal: Normal range of motion.  Neurological: She is alert and oriented to person, place, and time.  Skin: Skin is warm and dry.  Psychiatric: She has a normal mood and affect. Her behavior is normal. Judgment and thought content normal.  Right mastectomy site is within normal limits.  No palpable supraclavicular or axillary adenopathy. Abdomen: Soft nontender with no palpable abnormality.   LABORATORY DATA:  I have reviewed the labs as listed.  CBC    Component Value  Date/Time   WBC 6.1 07/05/2018 1237   RBC 4.24 07/05/2018 1237   HGB 13.4 07/05/2018 1237   HCT 40.6 07/05/2018 1237   PLT 277 07/05/2018 1237   MCV 95.8 07/05/2018 1237   MCH 31.6 07/05/2018 1237   MCHC  33.0 07/05/2018 1237   RDW 13.8 07/05/2018 1237   LYMPHSABS 1.2 07/05/2018 1237   MONOABS 0.3 07/05/2018 1237   EOSABS 0.0 07/05/2018 1237   BASOSABS 0.0 07/05/2018 1237   CMP Latest Ref Rng & Units 07/05/2018 05/05/2018 03/24/2018  Glucose 70 - 99 mg/dL 91 129(H) 92  BUN 6 - 20 mg/dL '10 10 12  ' Creatinine 0.44 - 1.00 mg/dL 0.81 0.67 0.72  Sodium 135 - 145 mmol/L 140 143 141  Potassium 3.5 - 5.1 mmol/L 3.7 3.3(L) 3.8  Chloride 98 - 111 mmol/L 106 109 106  CO2 22 - 32 mmol/L '26 27 27  ' Calcium 8.9 - 10.3 mg/dL 9.6 9.2 9.4  Total Protein 6.5 - 8.1 g/dL 7.8 6.7 7.0  Total Bilirubin 0.3 - 1.2 mg/dL 0.9 0.4 0.7  Alkaline Phos 38 - 126 U/L 68 71 76  AST 15 - 41 U/L '19 18 16  ' ALT 0 - 44 U/L '15 15 14    ' I have reviewed her mammogram dated 06/14/2018.     ASSESSMENT & PLAN:   Malignant neoplasm of overlapping sites of right female breast ([place]) 1.  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 -As she has a high risk disease (based on SOFT and TEXT trails), I have recommended ovarian ablation with the aromatase inhibitor therapy (preferably exemestane). Patient is pre-menopausal.  I have recommended surgical oophorectomy. -Bilateral oophorectomy done on 03/16/2018 -She completed last dose of Herceptin and Pertuzumab on 05/26/2018. - Exemestane 25 mg daily was started on 05/26/2018. - We discussed the results of the left breast mammogram dated 06/14/2018 which was BI-RADS Category 1. - She was counseled to do physical exercise most days of the week.  She will continue calcium and  vitamin D supplements. -We will see her back in 4 months for follow-up.  2.  High risk drug monitoring for Herceptin: She does not have any clinical signs or symptoms of CHF.  Echocardiogram on 02/22/2018 reviewed by me showed ejection fraction of 60 to 65%.  3.  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  She does have on and off numbness in the fingertips.  Even though she was prescribed gabapentin 100 mg 3 times daily, she never took it because of concern for side effects.  She does have left ankle pain for which she takes tramadol as needed.      Orders placed this encounter:  Orders Placed This Encounter  Procedures  . Comprehensive metabolic panel  . CBC with Differential  . Cancer antigen 15-3      Derek Jack, MD [place] [phone]

## 2018-08-30 ENCOUNTER — Other Ambulatory Visit (HOSPITAL_COMMUNITY): Payer: Self-pay | Admitting: Nurse Practitioner

## 2018-08-30 DIAGNOSIS — G8929 Other chronic pain: Secondary | ICD-10-CM

## 2018-08-30 DIAGNOSIS — M25572 Pain in left ankle and joints of left foot: Principal | ICD-10-CM

## 2018-08-30 MED ORDER — TRAMADOL HCL 50 MG PO TABS: ORAL_TABLET | ORAL | 0 refills | Status: DC

## 2018-08-31 ENCOUNTER — Other Ambulatory Visit (HOSPITAL_COMMUNITY): Payer: Self-pay | Admitting: *Deleted

## 2018-08-31 DIAGNOSIS — G8929 Other chronic pain: Secondary | ICD-10-CM

## 2018-08-31 DIAGNOSIS — M25572 Pain in left ankle and joints of left foot: Principal | ICD-10-CM

## 2018-08-31 MED ORDER — TRAMADOL HCL 50 MG PO TABS: ORAL_TABLET | ORAL | 0 refills | Status: DC

## 2018-09-20 ENCOUNTER — Other Ambulatory Visit (HOSPITAL_COMMUNITY): Payer: Self-pay | Admitting: *Deleted

## 2018-09-20 DIAGNOSIS — G8929 Other chronic pain: Secondary | ICD-10-CM

## 2018-09-20 DIAGNOSIS — M25572 Pain in left ankle and joints of left foot: Principal | ICD-10-CM

## 2018-09-20 MED ORDER — TRAMADOL HCL 50 MG PO TABS: ORAL_TABLET | ORAL | 0 refills | Status: DC

## 2018-10-16 IMAGING — MR MR BILATERAL BREAST WITHOUT AND WITH CONTRAST
7 of 12 series · 29 of 48 positions shown · IV contrast (12ml Multihance)
Comparison: Previous exam(s).

CLINICAL DATA: 46-year-old female with history of right breast
cancer diagnosed April 2017. Patient underwent ultrasound-guided
biopsy of a large palpable mass in the upper-outer quadrant of the
right breast as well as ultrasound-guided biopsy of a mass in the
lower inner quadrant of the right breast as well as biopsy of a
right axillary lymph node, all of which returned positive for
malignancy. In addition, a mass was present eroding through the skin
in the lower outer right breast in the region of the inframammary
fold.
TECHNIQUE: Multiplanar, multisequence MR images of both breasts were obtained
prior to and following the intravenous administration of 11 ml of
MultiHance.

[Series 2: T2 · axial · 4.0mm · 0.91mm/px · 1 of 43 slices shown]
[im 1/43]
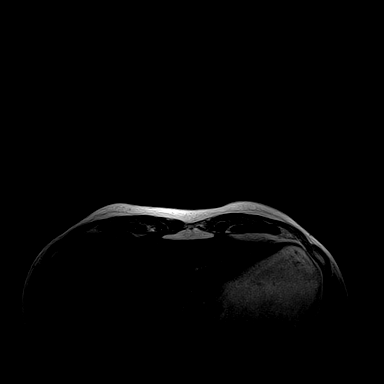

[Series 3: t2_tirm_tra ipat (a-p) · axial · 3.0mm · 0.70mm/px · 1 of 60 slices shown]
[im 1/60]
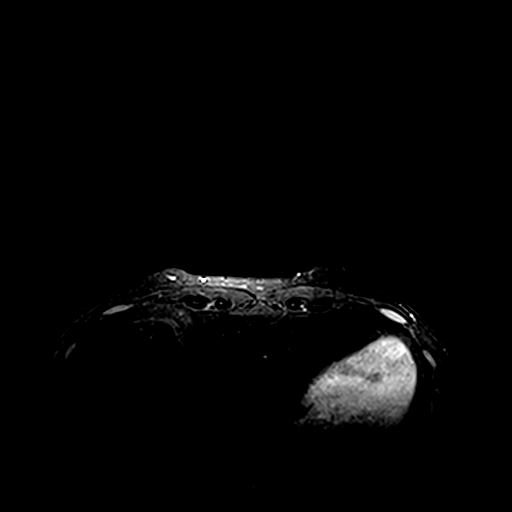

[Series 4: fl3d pre-cm no · axial · non-contrast · 1.1mm · 0.80mm/px · z∈[-93,+100]mm · 7 of 176 slices shown]
[im 1/176]
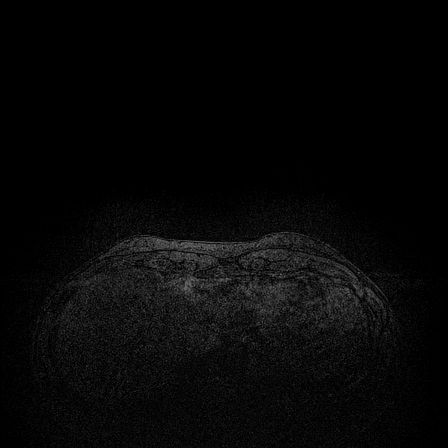
[im 30/176]
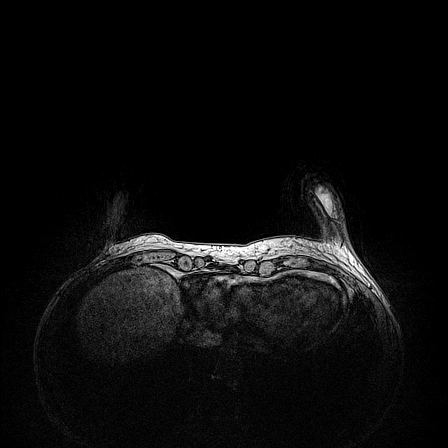
[im 59/176]
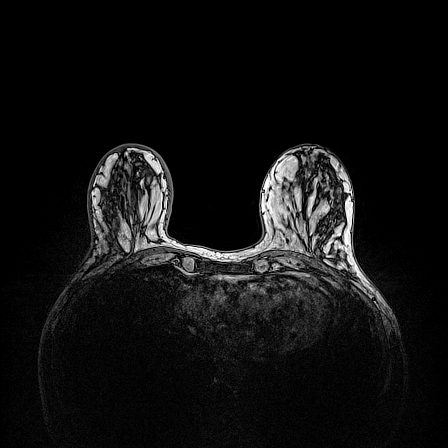
[im 88/176]
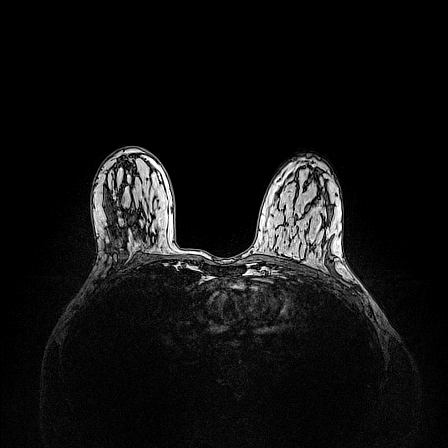
[im 117/176]
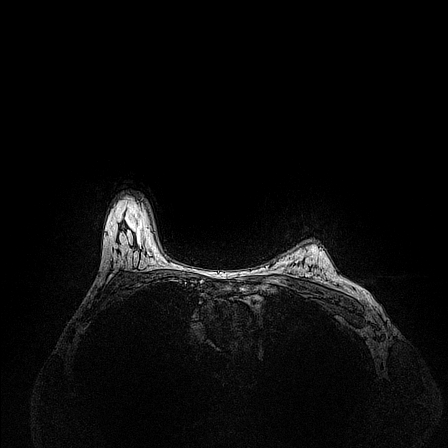
[im 146/176]
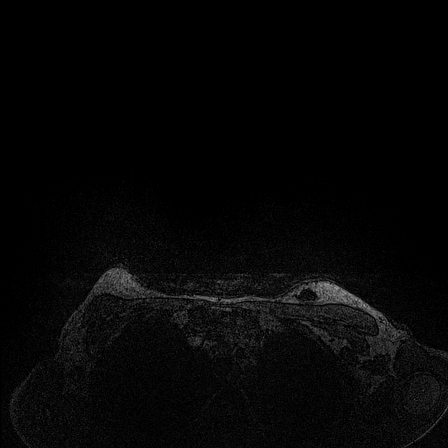
[im 176/176]
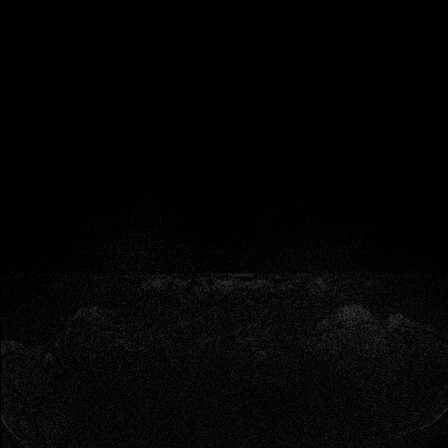

[Series 5: fl3d pre-cm · axial · non-contrast · 1.3mm · 0.94mm/px · z∈[-100,+107]mm · 6 of 160 slices shown]
[im 1/160]
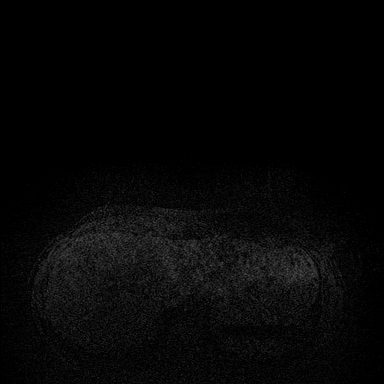
[im 32/160]
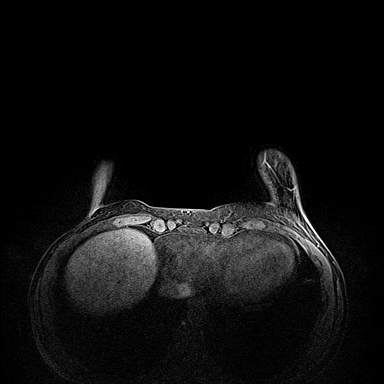
[im 64/160]
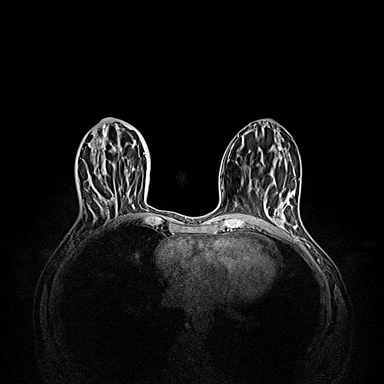
[im 96/160]
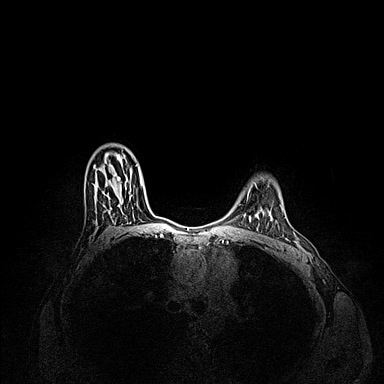
[im 128/160]
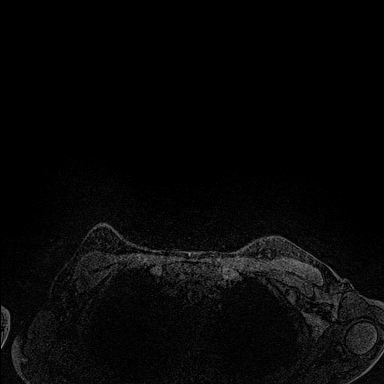
[im 160/160]
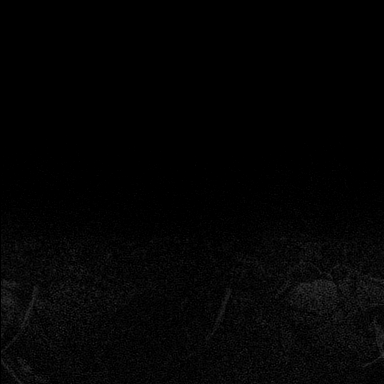

[Series 6: fl3d dynamic post · axial · 1.3mm · 0.94mm/px · z∈[-100,+107]mm · 6 of 160 slices shown (1 of 2)]
[im 1/160]
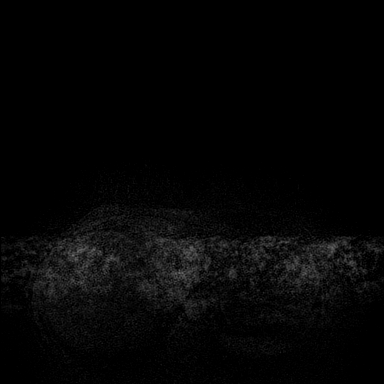
[im 32/160]
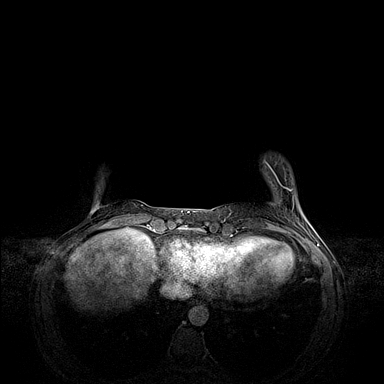
[im 64/160]
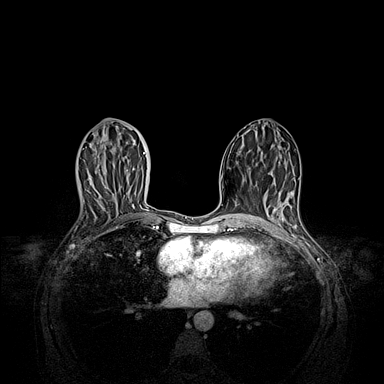
[im 96/160]
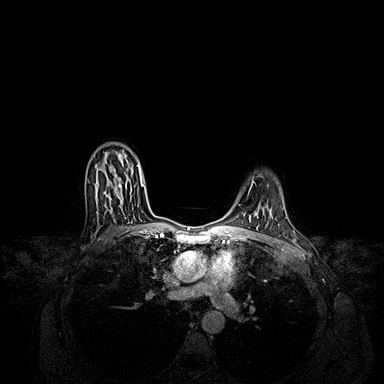
[im 128/160]
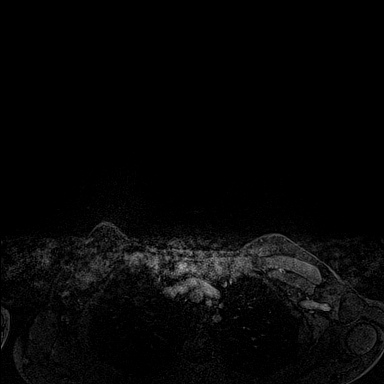
[im 160/160]
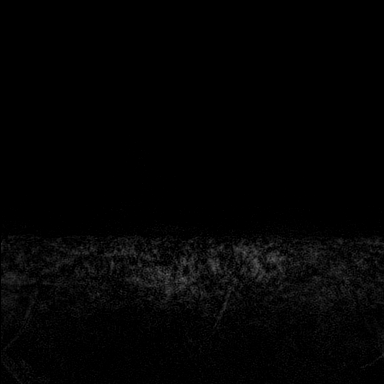

[Series 7: fl3d dynamic post · axial · 1.3mm · 0.94mm/px · z∈[-100,+107]mm · 6 of 160 slices shown (2 of 2)]
[im 1/160]
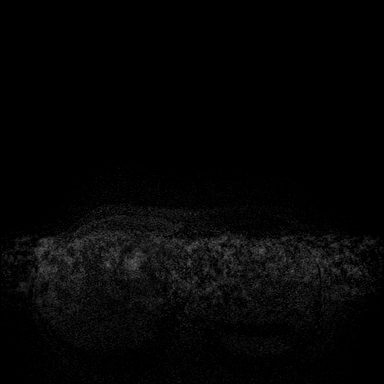
[im 32/160]
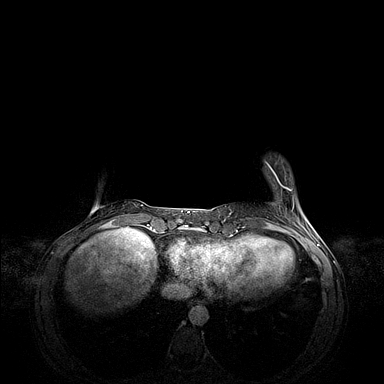
[im 64/160]
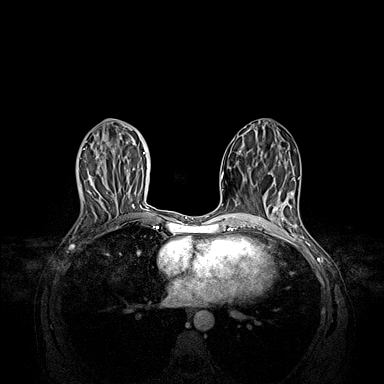
[im 96/160]
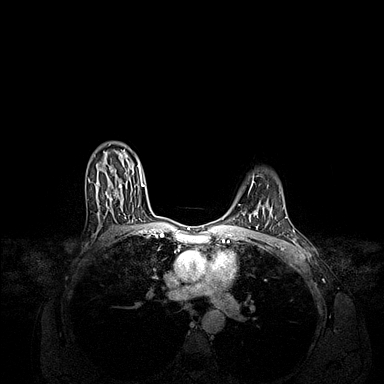
[im 128/160]
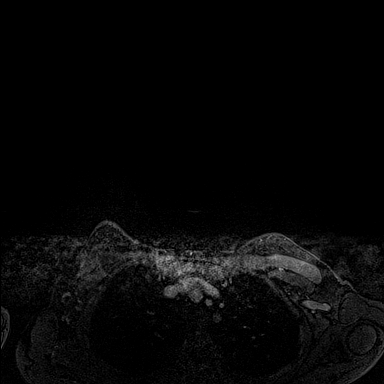
[im 160/160]
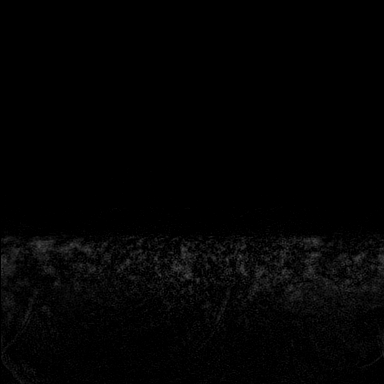

[Series 8: fl3d dynamic post_sub · axial · 1.3mm · 0.94mm/px · z∈[-100,-60]mm · 2 of 160 slices shown]
[im 1/160]
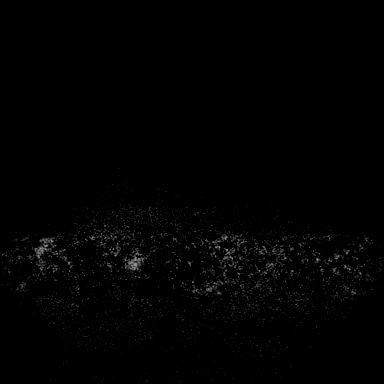
[im 32/160]
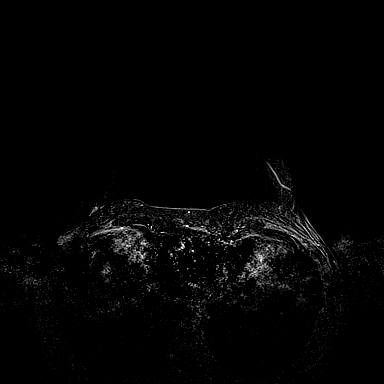

[29 of 48 positions shown; findings below may reference images not displayed]

Most recent MRI following neoadjuvant chemotherapy dated 07/21/2017
demonstrated treatment response with no definite residual
enhancement in the right breast, however persistence abnormal lymph
nodes were seen in the low right axilla at the approximate 8 to [DATE]
position.

LABS:  Not applicable.

EXAM:
BILATERAL BREAST MRI WITH AND WITHOUT CONTRAST
THREE-DIMENSIONAL MR IMAGE RENDERING ON INDEPENDENT WORKSTATION:

Three-dimensional MR images were rendered by post-processing of the
original MR data on an independent workstation. The
three-dimensional MR images were interpreted, and findings are
reported in the following complete MRI report for this study. Three
dimensional images were evaluated at the independent [employer]
workstation
FINDINGS: Breast composition: c.  Heterogeneous fibroglandular tissue.

Background parenchymal enhancement: Marked.

Right breast: Small enhancing masses are seen in the right breast
likely representing residual disease. An enhancing mass in the inner
right breast at prior site of biopsy measures approximately 5 mm
(image 85). An additional 4 mm enhancing mass is present in the
central upper right breast mid depth (image 63).

There is persistent diffuse skin thickening of right breast, mildly
improved when compared to prior exam. The more localized skin
thickening with associated enhancement involving the most inferior
portion of the right breast at site of previously seen fungating
mass (image 126) measures approximately 11 mm.

Left breast: No suspicious rapidly enhancing masses or abnormal
areas of enhancement in the left breast to suggest malignancy.

Lymph nodes: The previously seen abnormal lymph nodes in the low
right axilla are now normal in appearance compatible with treatment
response. A low axillary lymph node at the approximate 8 to [DATE]

Ancillary findings:  A port is present in the upper left chest.
IMPRESSION: 1. Marked background enhancement limiting evaluation of the breast
parenchyma.

2. Subcentimeter enhancing masses in the inner and upper right
breast as well as skin thickening with associated enhancement in the
region of right inframammary fold suggestive of residual disease.
(no abnormal enhancement was seen on prior MRI 07/21/2017).

[DATE]. Improvement/resolution of previously seen abnormal lymph nodes in
the low right axilla at the approximate 8 to [DATE] position.

RECOMMENDATION:
Treatment plan for known right breast malignancy.

BI-RADS CATEGORY  6: Known biopsy-proven malignancy.

## 2018-10-18 ENCOUNTER — Other Ambulatory Visit (HOSPITAL_COMMUNITY): Payer: Self-pay | Admitting: Hematology

## 2018-10-18 ENCOUNTER — Telehealth (HOSPITAL_COMMUNITY): Payer: Self-pay | Admitting: *Deleted

## 2018-10-18 DIAGNOSIS — C50911 Malignant neoplasm of unspecified site of right female breast: Secondary | ICD-10-CM

## 2018-10-18 DIAGNOSIS — M25572 Pain in left ankle and joints of left foot: Secondary | ICD-10-CM

## 2018-10-18 DIAGNOSIS — G8929 Other chronic pain: Secondary | ICD-10-CM

## 2018-10-18 DIAGNOSIS — C773 Secondary and unspecified malignant neoplasm of axilla and upper limb lymph nodes: Principal | ICD-10-CM

## 2018-10-18 MED ORDER — TRAMADOL HCL 50 MG PO TABS: ORAL_TABLET | ORAL | 0 refills | Status: DC

## 2018-10-18 NOTE — Telephone Encounter (Signed)
I have sent request to physician.

## 2018-10-19 MED ORDER — TRAMADOL HCL 50 MG PO TABS: ORAL_TABLET | ORAL | 0 refills | Status: DC

## 2018-10-19 NOTE — Addendum Note (Signed)
Addended by: Donetta Potts on: 10/19/2018 09:25 AM   Modules accepted: Orders

## 2018-11-03 ENCOUNTER — Other Ambulatory Visit (HOSPITAL_COMMUNITY): Payer: Self-pay | Admitting: *Deleted

## 2018-11-03 ENCOUNTER — Inpatient Hospital Stay (HOSPITAL_COMMUNITY): Payer: Self-pay | Attending: Hematology

## 2018-11-03 ENCOUNTER — Other Ambulatory Visit: Payer: Self-pay

## 2018-11-03 DIAGNOSIS — Z87891 Personal history of nicotine dependence: Secondary | ICD-10-CM | POA: Insufficient documentation

## 2018-11-03 DIAGNOSIS — Z9011 Acquired absence of right breast and nipple: Secondary | ICD-10-CM | POA: Insufficient documentation

## 2018-11-03 DIAGNOSIS — C50811 Malignant neoplasm of overlapping sites of right female breast: Secondary | ICD-10-CM | POA: Insufficient documentation

## 2018-11-03 DIAGNOSIS — Z79811 Long term (current) use of aromatase inhibitors: Secondary | ICD-10-CM | POA: Insufficient documentation

## 2018-11-03 DIAGNOSIS — G8929 Other chronic pain: Secondary | ICD-10-CM

## 2018-11-03 DIAGNOSIS — M25572 Pain in left ankle and joints of left foot: Principal | ICD-10-CM

## 2018-11-03 DIAGNOSIS — Z90722 Acquired absence of ovaries, bilateral: Secondary | ICD-10-CM | POA: Insufficient documentation

## 2018-11-03 DIAGNOSIS — C50911 Malignant neoplasm of unspecified site of right female breast: Secondary | ICD-10-CM

## 2018-11-03 DIAGNOSIS — Z170 Estrogen receptor positive status [ER+]: Secondary | ICD-10-CM | POA: Insufficient documentation

## 2018-11-03 DIAGNOSIS — Z79899 Other long term (current) drug therapy: Secondary | ICD-10-CM | POA: Insufficient documentation

## 2018-11-03 DIAGNOSIS — Z923 Personal history of irradiation: Secondary | ICD-10-CM | POA: Insufficient documentation

## 2018-11-03 DIAGNOSIS — Z9221 Personal history of antineoplastic chemotherapy: Secondary | ICD-10-CM | POA: Insufficient documentation

## 2018-11-03 DIAGNOSIS — C773 Secondary and unspecified malignant neoplasm of axilla and upper limb lymph nodes: Secondary | ICD-10-CM

## 2018-11-03 DIAGNOSIS — R232 Flushing: Secondary | ICD-10-CM | POA: Insufficient documentation

## 2018-11-03 LAB — COMPREHENSIVE METABOLIC PANEL
ALK PHOS: 67 U/L (ref 38–126)
ALT: 12 U/L (ref 0–44)
AST: 17 U/L (ref 15–41)
Albumin: 4.4 g/dL (ref 3.5–5.0)
Anion gap: 9 (ref 5–15)
BUN: 9 mg/dL (ref 6–20)
CALCIUM: 10 mg/dL (ref 8.9–10.3)
CHLORIDE: 103 mmol/L (ref 98–111)
CO2: 29 mmol/L (ref 22–32)
CREATININE: 0.82 mg/dL (ref 0.44–1.00)
GFR calc Af Amer: >60 mL/min (ref >60)
Glucose, Bld: 94 mg/dL (ref 70–99)
Potassium: 4.1 mmol/L (ref 3.5–5.1)
SODIUM: 141 mmol/L (ref 135–145)
Total Bilirubin: 0.8 mg/dL (ref 0.3–1.2)
Total Protein: 7.7 g/dL (ref 6.5–8.1)

## 2018-11-03 LAB — CBC WITH DIFFERENTIAL/PLATELET
ABS IMMATURE GRANULOCYTES: 0.02 10*3/uL (ref 0.00–0.07)
BASOS ABS: 0 10*3/uL (ref 0.0–0.1)
BASOS PCT: 1 %
EOS ABS: 0.1 10*3/uL (ref 0.0–0.5)
Eosinophils Relative: 2 %
HCT: 42.8 % (ref 36.0–46.0)
Hemoglobin: 14.2 g/dL (ref 12.0–15.0)
IMMATURE GRANULOCYTES: 0 %
Lymphocytes Relative: 23 %
Lymphs Abs: 1.3 10*3/uL (ref 0.7–4.0)
MCH: 32.7 pg (ref 26.0–34.0)
MCHC: 33.2 g/dL (ref 30.0–36.0)
MCV: 98.6 fL (ref 80.0–100.0)
Monocytes Absolute: 0.4 10*3/uL (ref 0.1–1.0)
Monocytes Relative: 6 %
NEUTROS ABS: 3.8 10*3/uL (ref 1.7–7.7)
NEUTROS PCT: 68 %
NRBC: 0 % (ref 0.0–0.2)
PLATELETS: 270 10*3/uL (ref 150–400)
RBC: 4.34 MIL/uL (ref 3.87–5.11)
RDW: 13.3 % (ref 11.5–15.5)
WBC: 5.5 10*3/uL (ref 4.0–10.5)

## 2018-11-04 LAB — CANCER ANTIGEN 15-3: CAN 15 3: 13 U/mL (ref 0.0–25.0)

## 2018-11-04 MED ORDER — TRAMADOL HCL 50 MG PO TABS: ORAL_TABLET | ORAL | 0 refills | Status: DC

## 2018-11-10 ENCOUNTER — Other Ambulatory Visit: Payer: Self-pay

## 2018-11-10 ENCOUNTER — Inpatient Hospital Stay (HOSPITAL_BASED_OUTPATIENT_CLINIC_OR_DEPARTMENT_OTHER): Payer: Self-pay | Admitting: Hematology

## 2018-11-10 ENCOUNTER — Encounter (HOSPITAL_COMMUNITY): Payer: Self-pay | Admitting: Hematology

## 2018-11-10 DIAGNOSIS — Z79899 Other long term (current) drug therapy: Secondary | ICD-10-CM

## 2018-11-10 DIAGNOSIS — Z79811 Long term (current) use of aromatase inhibitors: Secondary | ICD-10-CM

## 2018-11-10 DIAGNOSIS — Z170 Estrogen receptor positive status [ER+]: Secondary | ICD-10-CM

## 2018-11-10 DIAGNOSIS — Z9221 Personal history of antineoplastic chemotherapy: Secondary | ICD-10-CM

## 2018-11-10 DIAGNOSIS — Z9011 Acquired absence of right breast and nipple: Secondary | ICD-10-CM

## 2018-11-10 DIAGNOSIS — R232 Flushing: Secondary | ICD-10-CM

## 2018-11-10 DIAGNOSIS — Z90722 Acquired absence of ovaries, bilateral: Secondary | ICD-10-CM

## 2018-11-10 DIAGNOSIS — Z87891 Personal history of nicotine dependence: Secondary | ICD-10-CM

## 2018-11-10 DIAGNOSIS — Z1239 Encounter for other screening for malignant neoplasm of breast: Secondary | ICD-10-CM

## 2018-11-10 DIAGNOSIS — M25572 Pain in left ankle and joints of left foot: Secondary | ICD-10-CM

## 2018-11-10 DIAGNOSIS — Z923 Personal history of irradiation: Secondary | ICD-10-CM

## 2018-11-10 DIAGNOSIS — C50811 Malignant neoplasm of overlapping sites of right female breast: Secondary | ICD-10-CM

## 2018-11-10 NOTE — Patient Instructions (Addendum)
[place] at [facility] Discharge Instructions  You were seen today by Dr. Delton Coombes. He went over your recent lab results and everything looks great. He will see you back in 4 months for labs, mammogram and follow up.   Thank you for choosing [place] at [facility] to provide your oncology and hematology care.  To afford each patient quality time with our provider, please arrive at least 15 minutes before your scheduled appointment time.   If you have a lab appointment with the [facility] please come in thru the  Main Entrance and check in at the main information desk  You need to re-schedule your appointment should you arrive 10 or more minutes late.  We strive to give you quality time with our providers, and arriving late affects you and other patients whose appointments are after yours.  Also, if you no show three or more times for appointments you may be dismissed from the clinic at the providers discretion.     Again, thank you for choosing [facility].  Our hope is that these requests will decrease the amount of time that you wait before being seen by our physicians.       _____________________________________________________________  Should you have questions after your visit to [facility], please contact our office at [phone] between the hours of 8:00 a.m. and 4:30 p.m.  Voicemails left after 4:00 p.m. will not be returned until the following business day.  For prescription refill requests, have your pharmacy contact our office and allow 72 hours.    Cancer Center Support Programs:   > Cancer Support Group  2nd Tuesday of the month 1pm-2pm, Journey Room

## 2018-11-10 NOTE — Assessment & Plan Note (Signed)
1.  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 -Bilateral oophorectomy done on 03/16/2018 -1 year of Herceptin and Pertuzumab completed on 05/26/2018. -Exemestane 25 mg daily started on 05/26/2018. -Left breast mammogram dated 06/14/2018 was BI-RADS Category 1. -Today's physical examination did not reveal any palpable masses in the right mastectomy site.  Left breast has no palpable masses.  No palpable adenopathy. - We reviewed her blood work including tumor marker which were within normal limits. -She will come back in 4 months for follow-up with repeat labs. -We will schedule her left breast mammogram in October 2020.  2.  Left ankle pain: -This is from a combination of prior surgery and slight worsening after exemestane. -She is taking tramadol in the evenings.

## 2018-11-10 NOTE — Progress Notes (Signed)
[place], [place] [address]   CLINIC:  Medical Oncology/Hematology  PCP:  Patient, No Pcp Per No address on file None   REASON FOR VISIT:  Follow-up for breast cancer, ER+/PR+/HER2+  CURRENT THERAPY:  Exemestane 25 mg daily.   BRIEF ONCOLOGIC HISTORY:    Malignant neoplasm of overlapping sites of right breast in female, estrogen receptor positive ([place])   05/12/2017 Initial Biopsy    (R) breast (LIQ): IDC, grade 3; ER+ (80%), PR+ (10%), HER2+ (ratio 2.56). Ki67 80%.      05/12/2017 Procedure    Additional (R) breast biopsy (UOQ): IDC, grade 3; ER+ (80%), PR+ (50%), HER2+ (ratio 2.77), Ki67 90%.     05/12/2017 Procedure    (R) axillary lymph node biopsy: Metastatic carcinoma; ER+ (80%), PR+ (30%), HER2+ (ratio 2.99), Ki67 80%.     05/12/2017 Imaging    Mammogram/breast US: Targeted ultrasound is performed, showing multiple irregular hypoechoic masses. In the 6 o'clock region of the right breast is a large irregular mass spans at least 4.8 cm. In the 4:00 to 5:00 region of the right breast an irregular mass measures 4.5 cm. In the 930-10 o'clock position of the right breast centered 9-10 cm from the nipple is an irregular 4.0 x 3.8 x 1.9 cm hypoechoic mass. In the 9:30 position of the right breast at the lateral margin is a probable abnormal intramammary lymph node measuring 1.3 cm greatest diameter. In the 10 o'clock position of the right breast far lateral is a hypoechoic dermal nodule that measures 0.5 cm. Diffuse skin thickening of the right breast measures up to 4.5 mm.    05/14/2017 Tumor Marker      Ref. Range 05/14/2017 15:30  CA 15-3 Latest Ref Range: 0.0 - 25.0 U/mL 115.9 (H)     Ref. Range 05/14/2017 15:30  CA 27.29 Latest Ref Range: 0.0 - 38.6 U/mL 157.7 (H)      05/15/2017 Imaging    CT chest/abd/pelvis:  IMPRESSION: 1. Numerous right breast masses and metastatic right axillary lymphadenopathy. Findings suspicious for chest  wall invasion along the lower lateral aspect of the breast. 2. Two small indeterminate right lower lobe pulmonary nodules will require surveillance. 3. No findings for metastatic disease involving the abdomen/pelvis or osseous structures.    05/15/2017 Imaging    Bone scan:  IMPRESSION: No evidence of metastatic disease.    05/18/2017 Echocardiogram    LV EF: 60% -   65%    05/20/2017 Procedure    Port-a-cath placement     05/25/2017 Initial Diagnosis    Malignant neoplasm of overlapping sites of right breast in female, estrogen receptor positive ([place])    05/27/2017 - 09/10/2017 Neo-Adjuvant Chemotherapy    TCHPx 6 cycles    10/14/2017 Surgery    Right modified radical mastectomy and lymph node biopsy on 10/14/2017, with complete pathological response, [id number]      CANCER STAGING: Cancer Staging Malignant neoplasm of overlapping sites of right breast in female, estrogen receptor positive ([place]) Staging form: Breast, AJCC 8th Edition - Clinical stage from 05/25/2017: Stage IIIB (cT4b, cN3a, cM0, G3, ER+, PR+, HER2+) - Signed by Holley Bouche, NP on 07/07/2017  Malignant neoplasm of overlapping sites of right female breast [facility]) Staging form: Breast, AJCC 8th Edition - Clinical: No Stage Recommended (ycT4b, cN3a, cM0, G3, ER: Positive, PR: Positive, HER2: Positive) - Signed by Twana First, MD on 05/25/2017    INTERVAL HISTORY:  Ms. Pearcy 48 y.o. female returns  for routine follow-up. She is here today with her husband. She states that she is taking the Exemestane and calcium with vit D as directed. She states that she has had more hot flashes recently. Denies any nausea, vomiting, or diarrhea. Denies any new pains. Had not noticed any recent bleeding such as epistaxis, hematuria or hematochezia. Denies recent chest pain on exertion, shortness of breath on minimal exertion, pre-syncopal episodes, or palpitations. Denies any numbness or tingling in hands or feet. Denies any  recent fevers, infections, or recent hospitalizations. Patient reports appetite at % and energy level at %.   REVIEW OF SYSTEMS:  Review of Systems  All other systems reviewed and are negative.    PAST MEDICAL/SURGICAL HISTORY:  Past Medical History:  Diagnosis Date  . Cancer [facility])    right breast cancer  . History of radiation therapy 12/03/17- 01/13/18   Right Chest wall and supraclavicular region treated to 50 Gy with 25 fx of 2 Gy followed by a boost of 10 Gy in 5 fx.   . Personal history of chemotherapy   . Personal history of radiation therapy    Past Surgical History:  Procedure Laterality Date  . BUNIONECTOMY Left 2000  . EXTERNAL FIXATION LEG Left 02/16/2017   Procedure: EXTERNAL FIXATION LEG;  Surgeon: Mcarthur Rossetti, MD;  Location: [place];  Service: Orthopedics;  Laterality: Left;  . HARDWARE REMOVAL Left 05/21/2017   Procedure: REMOVAL OF RETAINED PROMINENT SCREW LEFT ANKLE;  Surgeon: Mcarthur Rossetti, MD;  Location: [place];  Service: Orthopedics;  Laterality: Left;  . LAPAROSCOPIC BILATERAL SALPINGO OOPHERECTOMY Bilateral 03/16/2018   Procedure: LAPAROSCOPIC BILATERAL SALPINGO OOPHORECTOMY;  Surgeon: Jonnie Kind, MD;  Location: [facility];  Service: Gynecology;  Laterality: Bilateral;  . LAPAROSCOPIC LYSIS OF ADHESIONS N/A 03/16/2018   Procedure: LAPAROSCOPIC LYSIS OF ADHESIONS;  Surgeon: Jonnie Kind, MD;  Location: [facility];  Service: Gynecology;  Laterality: N/A;  . MASTECTOMY MODIFIED RADICAL Right 10/14/2017   Procedure: MASTECTOMY MODIFIED RADICAL;  Surgeon: Virl Cagey, MD;  Location: [facility];  Service: General;  Laterality: Right;  . ORIF ANKLE FRACTURE Left 03/03/2017  . ORIF ANKLE FRACTURE Left 03/03/2017   Procedure: OPEN REDUCTION INTERNAL FIXATION (ORIF) LEFT PILON FRACTURE AND LEFT LATERAL MALLEOLUS;  Surgeon: Mcarthur Rossetti, MD;  Location: [place];  Service: Orthopedics;  Laterality: Left;  . OVARIAN CYST REMOVAL Left 1991  .  PORT-A-CATH REMOVAL Left 06/18/2018   Procedure: MINOR REMOVAL PORT-A-CATH;  Surgeon: Virl Cagey, MD;  Location: [facility];  Service: General;  Laterality: Left;  . PORTACATH PLACEMENT    . PORTACATH PLACEMENT Left 05/20/2017   Procedure: INSERTION PORT-A-CATH;  Surgeon: Virl Cagey, MD;  Location: [facility];  Service: General;  Laterality: Left;  . WISDOM TOOTH EXTRACTION       SOCIAL HISTORY:  Social History   Socioeconomic History  . Marital status: Married    Spouse name: Not on file  . Number of children: Not on file  . Years of education: Not on file  . Highest education level: Not on file  Occupational History  . Not on file  Social Needs  . Financial resource strain: Not on file  . Food insecurity:    Worry: Not on file    Inability: Not on file  . Transportation needs:    Medical: Not on file    Non-medical: Not on file  Tobacco Use  . Smoking status: Former Smoker    Packs/day: 1.00  Years: 10.00    Pack years: 10.00    Types: Cigarettes    Last attempt to quit: 02/16/2017    Years since quitting: 1.7  . Smokeless tobacco: Never Used  Substance and Sexual Activity  . Alcohol use: Yes    Comment: occasional  . Drug use: No  . Sexual activity: Yes    Birth control/protection: None  Lifestyle  . Physical activity:    Days per week: Not on file    Minutes per session: Not on file  . Stress: Not on file  Relationships  . Social connections:    Talks on phone: Not on file    Gets together: Not on file    Attends religious service: Not on file    Active member of club or organization: Not on file    Attends meetings of clubs or organizations: Not on file    Relationship status: Not on file  . Intimate partner violence:    Fear of current or ex partner: Not on file    Emotionally abused: Not on file    Physically abused: Not on file    Forced sexual activity: Not on file  Other Topics Concern  . Not on file  Social History Narrative  . Not on  file    FAMILY HISTORY:  Family History  Problem Relation Age of Onset  . Colon cancer Father     CURRENT MEDICATIONS:  Outpatient Encounter Medications as of 11/10/2018  Medication Sig  . acetaminophen (TYLENOL) 500 MG tablet Take 1,000 mg by mouth daily as needed for moderate pain or headache.  . exemestane (AROMASIN) 25 MG tablet TAKE 1 TABLET BY MOUTH IN THE MORNING AFTER BREAKFAST.  Marland Kitchen traMADol (ULTRAM) 50 MG tablet TAKE (1) TABLET BY MOUTH EVERY SIX HOURS AS NEEDED FOR SEVERE PAIN.   No facility-administered encounter medications on file as of 11/10/2018.     ALLERGIES:  Allergies  Allergen Reactions  . Penicillins Nausea And Vomiting    Has patient had a PCN reaction causing immediate rash, facial/tongue/throat swelling, SOB or lightheadedness with hypotension: No Has patient had a PCN reaction causing severe rash involving mucus membranes or skin necrosis: No Has patient had a PCN reaction that required hospitalization: No Has patient had a PCN reaction occurring within the last 10 years: No If all of the above answers are "NO", then may proceed with Cephalosporin use.      PHYSICAL EXAM:  ECOG Performance status: 1  I have reviewed her vitals.  Blood pressure is 101/82, pulse rate is 70, respiratory 16, temperature is 97.8.  Saturations are 100%.  Physical Exam Constitutional:      Appearance: Normal appearance.  Cardiovascular:     Rate and Rhythm: Normal rate and regular rhythm.  Pulmonary:     Effort: Pulmonary effort is normal.     Breath sounds: Normal breath sounds.  Abdominal:     General: Bowel sounds are normal. There is no distension.     Palpations: Abdomen is soft.  Musculoskeletal:        General: No swelling.  Skin:    General: Skin is warm.  Neurological:     General: No focal deficit present.     Mental Status: She is alert and oriented to person, place, and time.  Psychiatric:        Mood and Affect: Mood normal.        Behavior:  Behavior normal.   Right mastectomy site is within normal limits.  Left breast has no palpable mass.   LABORATORY DATA:  I have reviewed the labs as listed.  CBC    Component Value Date/Time   WBC 5.5 11/03/2018 1148   RBC 4.34 11/03/2018 1148   HGB 14.2 11/03/2018 1148   HCT 42.8 11/03/2018 1148   PLT 270 11/03/2018 1148   MCV 98.6 11/03/2018 1148   MCH 32.7 11/03/2018 1148   MCHC 33.2 11/03/2018 1148   RDW 13.3 11/03/2018 1148   LYMPHSABS 1.3 11/03/2018 1148   MONOABS 0.4 11/03/2018 1148   EOSABS 0.1 11/03/2018 1148   BASOSABS 0.0 11/03/2018 1148   CMP Latest Ref Rng & Units 11/03/2018 07/05/2018 05/05/2018  Glucose 70 - 99 mg/dL 94 91 129(H)  BUN 6 - 20 mg/dL '9 10 10  ' Creatinine 0.44 - 1.00 mg/dL 0.82 0.81 0.67  Sodium 135 - 145 mmol/L 141 140 143  Potassium 3.5 - 5.1 mmol/L 4.1 3.7 3.3(L)  Chloride 98 - 111 mmol/L 103 106 109  CO2 22 - 32 mmol/L '29 26 27  ' Calcium 8.9 - 10.3 mg/dL 10.0 9.6 9.2  Total Protein 6.5 - 8.1 g/dL 7.7 7.8 6.7  Total Bilirubin 0.3 - 1.2 mg/dL 0.8 0.9 0.4  Alkaline Phos 38 - 126 U/L 67 68 71  AST 15 - 41 U/L '17 19 18  ' ALT 0 - 44 U/L '12 15 15       ' DIAGNOSTIC IMAGING:  I have independently reviewed the scans and discussed with the patient.   I have reviewed Venita Lick LPN's note and agree with the documentation.  I personally performed a face-to-face visit, made revisions and my assessment and plan is as follows.    ASSESSMENT & PLAN:   Malignant neoplasm of overlapping sites of right female breast ([place]) 1.  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 -Bilateral oophorectomy done on 03/16/2018 -1 year of Herceptin and Pertuzumab completed on 05/26/2018. -Exemestane 25 mg daily started on 05/26/2018. -Left breast mammogram  dated 06/14/2018 was BI-RADS Category 1. -Today's physical examination did not reveal any palpable masses in the right mastectomy site.  Left breast has no palpable masses.  No palpable adenopathy. - We reviewed her blood work including tumor marker which were within normal limits. -She will come back in 4 months for follow-up with repeat labs. -We will schedule her left breast mammogram in October 2020.  2.  Left ankle pain: -This is from a combination of prior surgery and slight worsening after exemestane. -She is taking tramadol in the evenings.      Orders placed this encounter:  Orders Placed This Encounter  Procedures  . MM Digital Screening [place], MD [place] [phone]

## 2018-11-18 ENCOUNTER — Other Ambulatory Visit (HOSPITAL_COMMUNITY): Payer: Self-pay | Admitting: *Deleted

## 2018-11-18 DIAGNOSIS — G8929 Other chronic pain: Secondary | ICD-10-CM

## 2018-11-18 DIAGNOSIS — M25572 Pain in left ankle and joints of left foot: Principal | ICD-10-CM

## 2018-11-18 MED ORDER — TRAMADOL HCL 50 MG PO TABS: ORAL_TABLET | ORAL | 0 refills | Status: DC

## 2018-12-01 ENCOUNTER — Other Ambulatory Visit (HOSPITAL_COMMUNITY): Payer: Self-pay | Admitting: Hematology

## 2018-12-01 DIAGNOSIS — C50911 Malignant neoplasm of unspecified site of right female breast: Secondary | ICD-10-CM

## 2018-12-01 DIAGNOSIS — C773 Secondary and unspecified malignant neoplasm of axilla and upper limb lymph nodes: Principal | ICD-10-CM

## 2018-12-02 ENCOUNTER — Other Ambulatory Visit (HOSPITAL_COMMUNITY): Payer: Self-pay | Admitting: *Deleted

## 2018-12-02 DIAGNOSIS — M25572 Pain in left ankle and joints of left foot: Principal | ICD-10-CM

## 2018-12-02 DIAGNOSIS — G8929 Other chronic pain: Secondary | ICD-10-CM

## 2018-12-02 MED ORDER — TRAMADOL HCL 50 MG PO TABS: ORAL_TABLET | ORAL | 0 refills | Status: DC

## 2018-12-17 ENCOUNTER — Other Ambulatory Visit (HOSPITAL_COMMUNITY): Payer: Self-pay | Admitting: *Deleted

## 2018-12-17 DIAGNOSIS — G8929 Other chronic pain: Secondary | ICD-10-CM

## 2018-12-17 DIAGNOSIS — M25572 Pain in left ankle and joints of left foot: Principal | ICD-10-CM

## 2018-12-17 MED ORDER — TRAMADOL HCL 50 MG PO TABS: ORAL_TABLET | ORAL | 0 refills | Status: DC

## 2018-12-31 ENCOUNTER — Other Ambulatory Visit (HOSPITAL_COMMUNITY): Payer: Self-pay | Admitting: *Deleted

## 2018-12-31 DIAGNOSIS — G8929 Other chronic pain: Secondary | ICD-10-CM

## 2018-12-31 MED ORDER — TRAMADOL HCL 50 MG PO TABS: ORAL_TABLET | ORAL | 0 refills | Status: DC

## 2019-01-13 ENCOUNTER — Other Ambulatory Visit (HOSPITAL_COMMUNITY): Payer: Self-pay | Admitting: *Deleted

## 2019-01-13 DIAGNOSIS — G8929 Other chronic pain: Secondary | ICD-10-CM

## 2019-01-13 DIAGNOSIS — M25572 Pain in left ankle and joints of left foot: Secondary | ICD-10-CM

## 2019-01-13 MED ORDER — TRAMADOL HCL 50 MG PO TABS: ORAL_TABLET | ORAL | 0 refills | Status: DC

## 2019-01-28 ENCOUNTER — Other Ambulatory Visit (HOSPITAL_COMMUNITY): Payer: Self-pay | Admitting: Emergency Medicine

## 2019-01-28 DIAGNOSIS — G8929 Other chronic pain: Secondary | ICD-10-CM

## 2019-01-31 ENCOUNTER — Other Ambulatory Visit (HOSPITAL_COMMUNITY): Payer: Self-pay | Admitting: *Deleted

## 2019-01-31 DIAGNOSIS — G8929 Other chronic pain: Secondary | ICD-10-CM

## 2019-01-31 DIAGNOSIS — M25572 Pain in left ankle and joints of left foot: Secondary | ICD-10-CM

## 2019-01-31 MED ORDER — TRAMADOL HCL 50 MG PO TABS: ORAL_TABLET | ORAL | 0 refills | Status: DC

## 2019-01-31 NOTE — Addendum Note (Signed)
Addended by: Derek Jack on: 01/31/2019 02:56 PM   Modules accepted: Orders

## 2019-02-28 ENCOUNTER — Other Ambulatory Visit (HOSPITAL_COMMUNITY): Payer: Self-pay | Admitting: Surgery

## 2019-02-28 DIAGNOSIS — G8929 Other chronic pain: Secondary | ICD-10-CM

## 2019-03-04 ENCOUNTER — Other Ambulatory Visit (HOSPITAL_COMMUNITY): Payer: Self-pay | Admitting: Nurse Practitioner

## 2019-03-04 DIAGNOSIS — M25572 Pain in left ankle and joints of left foot: Secondary | ICD-10-CM

## 2019-03-04 DIAGNOSIS — G8929 Other chronic pain: Secondary | ICD-10-CM

## 2019-03-04 MED ORDER — TRAMADOL HCL 50 MG PO TABS: ORAL_TABLET | ORAL | 0 refills | Status: DC

## 2019-03-09 ENCOUNTER — Inpatient Hospital Stay (HOSPITAL_COMMUNITY): Payer: Self-pay | Attending: Nurse Practitioner

## 2019-03-09 ENCOUNTER — Other Ambulatory Visit: Payer: Self-pay

## 2019-03-09 ENCOUNTER — Other Ambulatory Visit (HOSPITAL_COMMUNITY): Payer: Self-pay

## 2019-03-09 DIAGNOSIS — Z87891 Personal history of nicotine dependence: Secondary | ICD-10-CM | POA: Insufficient documentation

## 2019-03-09 DIAGNOSIS — R918 Other nonspecific abnormal finding of lung field: Secondary | ICD-10-CM | POA: Insufficient documentation

## 2019-03-09 DIAGNOSIS — M25572 Pain in left ankle and joints of left foot: Secondary | ICD-10-CM | POA: Insufficient documentation

## 2019-03-09 DIAGNOSIS — C50911 Malignant neoplasm of unspecified site of right female breast: Secondary | ICD-10-CM

## 2019-03-09 DIAGNOSIS — Z923 Personal history of irradiation: Secondary | ICD-10-CM | POA: Insufficient documentation

## 2019-03-09 DIAGNOSIS — Z9011 Acquired absence of right breast and nipple: Secondary | ICD-10-CM | POA: Insufficient documentation

## 2019-03-09 DIAGNOSIS — C50811 Malignant neoplasm of overlapping sites of right female breast: Secondary | ICD-10-CM | POA: Insufficient documentation

## 2019-03-09 DIAGNOSIS — Z170 Estrogen receptor positive status [ER+]: Secondary | ICD-10-CM | POA: Insufficient documentation

## 2019-03-09 DIAGNOSIS — Z9221 Personal history of antineoplastic chemotherapy: Secondary | ICD-10-CM | POA: Insufficient documentation

## 2019-03-09 DIAGNOSIS — Z79811 Long term (current) use of aromatase inhibitors: Secondary | ICD-10-CM | POA: Insufficient documentation

## 2019-03-09 DIAGNOSIS — Z79899 Other long term (current) drug therapy: Secondary | ICD-10-CM | POA: Insufficient documentation

## 2019-03-09 LAB — COMPREHENSIVE METABOLIC PANEL
ALT: 14 U/L (ref 0–44)
AST: 18 U/L (ref 15–41)
Albumin: 4.9 g/dL (ref 3.5–5.0)
Alkaline Phosphatase: 64 U/L (ref 38–126)
Anion gap: 10 (ref 5–15)
BUN: 9 mg/dL (ref 6–20)
CO2: 29 mmol/L (ref 22–32)
Calcium: 10.1 mg/dL (ref 8.9–10.3)
Chloride: 103 mmol/L (ref 98–111)
Creatinine, Ser: 0.74 mg/dL (ref 0.44–1.00)
GFR calc Af Amer: >60 mL/min (ref >60)
GFR calc non Af Amer: >60 mL/min (ref >60)
Glucose, Bld: 99 mg/dL (ref 70–99)
Potassium: 3.6 mmol/L (ref 3.5–5.1)
Sodium: 142 mmol/L (ref 135–145)
Total Bilirubin: 0.9 mg/dL (ref 0.3–1.2)
Total Protein: 7.9 g/dL (ref 6.5–8.1)

## 2019-03-09 LAB — CBC WITH DIFFERENTIAL/PLATELET
Abs Immature Granulocytes: 0.01 10*3/uL (ref 0.00–0.07)
Basophils Absolute: 0 10*3/uL (ref 0.0–0.1)
Basophils Relative: 1 %
Eosinophils Absolute: 0.1 10*3/uL (ref 0.0–0.5)
Eosinophils Relative: 2 %
HCT: 43.3 % (ref 36.0–46.0)
Hemoglobin: 14.6 g/dL (ref 12.0–15.0)
Immature Granulocytes: 0 %
Lymphocytes Relative: 24 %
Lymphs Abs: 1.2 10*3/uL (ref 0.7–4.0)
MCH: 33 pg (ref 26.0–34.0)
MCHC: 33.7 g/dL (ref 30.0–36.0)
MCV: 97.7 fL (ref 80.0–100.0)
Monocytes Absolute: 0.3 10*3/uL (ref 0.1–1.0)
Monocytes Relative: 6 %
Neutro Abs: 3.3 10*3/uL (ref 1.7–7.7)
Neutrophils Relative %: 67 %
Platelets: 256 10*3/uL (ref 150–400)
RBC: 4.43 MIL/uL (ref 3.87–5.11)
RDW: 13.2 % (ref 11.5–15.5)
WBC: 5 10*3/uL (ref 4.0–10.5)
nRBC: 0 % (ref 0.0–0.2)

## 2019-03-16 ENCOUNTER — Other Ambulatory Visit: Payer: Self-pay

## 2019-03-16 ENCOUNTER — Inpatient Hospital Stay (HOSPITAL_BASED_OUTPATIENT_CLINIC_OR_DEPARTMENT_OTHER): Payer: Self-pay | Admitting: Hematology

## 2019-03-16 ENCOUNTER — Encounter (HOSPITAL_COMMUNITY): Payer: Self-pay | Admitting: Hematology

## 2019-03-16 VITALS — BP 112/72 | HR 82 | Temp 97.7°F | Resp 18 | Wt 124.7 lb

## 2019-03-16 DIAGNOSIS — M25572 Pain in left ankle and joints of left foot: Secondary | ICD-10-CM

## 2019-03-16 DIAGNOSIS — C170 Malignant neoplasm of duodenum: Secondary | ICD-10-CM

## 2019-03-16 DIAGNOSIS — C50911 Malignant neoplasm of unspecified site of right female breast: Secondary | ICD-10-CM

## 2019-03-16 DIAGNOSIS — R918 Other nonspecific abnormal finding of lung field: Secondary | ICD-10-CM

## 2019-03-16 DIAGNOSIS — Z9221 Personal history of antineoplastic chemotherapy: Secondary | ICD-10-CM

## 2019-03-16 DIAGNOSIS — Z79811 Long term (current) use of aromatase inhibitors: Secondary | ICD-10-CM

## 2019-03-16 DIAGNOSIS — C50811 Malignant neoplasm of overlapping sites of right female breast: Secondary | ICD-10-CM

## 2019-03-16 DIAGNOSIS — Z923 Personal history of irradiation: Secondary | ICD-10-CM

## 2019-03-16 DIAGNOSIS — C773 Secondary and unspecified malignant neoplasm of axilla and upper limb lymph nodes: Secondary | ICD-10-CM

## 2019-03-16 DIAGNOSIS — Z79899 Other long term (current) drug therapy: Secondary | ICD-10-CM

## 2019-03-16 DIAGNOSIS — Z9011 Acquired absence of right breast and nipple: Secondary | ICD-10-CM

## 2019-03-16 DIAGNOSIS — Z87891 Personal history of nicotine dependence: Secondary | ICD-10-CM

## 2019-03-16 DIAGNOSIS — Z170 Estrogen receptor positive status [ER+]: Secondary | ICD-10-CM

## 2019-03-16 NOTE — Assessment & Plan Note (Signed)
1.  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 -Bilateral oophorectomy done on 03/16/2018 -1 year of Herceptin and Pertuzumab completed on 05/26/2018. -Exemestane 25 mg daily started on 05/26/2018. -Left breast mammogram dated 06/14/2018 was BI-RADS Category 1. - She is tolerating exemestane very well.  We reviewed her labs which are within normal limits. - She will come back in 4 months for follow-up with repeat labs.  We will schedule her for mammogram in October.  2.  Left ankle pain: -This is from a combination of prior surgery and slight worsening after exemestane. -She is taking tramadol in the evenings.

## 2019-03-16 NOTE — Progress Notes (Signed)
[place], [place] [address]   CLINIC:  Medical Oncology/Hematology  PCP:  Patient, No Pcp Per No address on file None   REASON FOR VISIT:  Follow-up for breast cancer, ER+/PR+/HER2+  CURRENT THERAPY:  Exemestane 25 mg daily.   BRIEF ONCOLOGIC HISTORY:  Oncology History  Malignant neoplasm of overlapping sites of right breast in female, estrogen receptor positive ([place])  05/12/2017 Initial Biopsy   (R) breast (LIQ): IDC, grade 3; ER+ (80%), PR+ (10%), HER2+ (ratio 2.56). Ki67 80%.     05/12/2017 Procedure   Additional (R) breast biopsy (UOQ): IDC, grade 3; ER+ (80%), PR+ (50%), HER2+ (ratio 2.77), Ki67 90%.    05/12/2017 Procedure   (R) axillary lymph node biopsy: Metastatic carcinoma; ER+ (80%), PR+ (30%), HER2+ (ratio 2.99), Ki67 80%.    05/12/2017 Imaging   Mammogram/breast US: Targeted ultrasound is performed, showing multiple irregular hypoechoic masses. In the 6 o'clock region of the right breast is a large irregular mass spans at least 4.8 cm. In the 4:00 to 5:00 region of the right breast an irregular mass measures 4.5 cm. In the 930-10 o'clock position of the right breast centered 9-10 cm from the nipple is an irregular 4.0 x 3.8 x 1.9 cm hypoechoic mass. In the 9:30 position of the right breast at the lateral margin is a probable abnormal intramammary lymph node measuring 1.3 cm greatest diameter. In the 10 o'clock position of the right breast far lateral is a hypoechoic dermal nodule that measures 0.5 cm. Diffuse skin thickening of the right breast measures up to 4.5 mm.   05/14/2017 Tumor Marker     Ref. Range 05/14/2017 15:30  CA 15-3 Latest Ref Range: 0.0 - 25.0 U/mL 115.9 (H)     Ref. Range 05/14/2017 15:30  CA 27.29 Latest Ref Range: 0.0 - 38.6 U/mL 157.7 (H)     05/15/2017 Imaging   CT chest/abd/pelvis:  IMPRESSION: 1. Numerous right breast masses and metastatic right axillary lymphadenopathy. Findings suspicious for  chest wall invasion along the lower lateral aspect of the breast. 2. Two small indeterminate right lower lobe pulmonary nodules will require surveillance. 3. No findings for metastatic disease involving the abdomen/pelvis or osseous structures.   05/15/2017 Imaging   Bone scan:  IMPRESSION: No evidence of metastatic disease.   05/18/2017 Echocardiogram   LV EF: 60% -   65%   05/20/2017 Procedure   Port-a-cath placement    05/25/2017 Initial Diagnosis   Malignant neoplasm of overlapping sites of right breast in female, estrogen receptor positive ([place])   05/27/2017 - 09/10/2017 Neo-Adjuvant Chemotherapy   TCHPx 6 cycles   10/14/2017 Surgery   Right modified radical mastectomy and lymph node biopsy on 10/14/2017, with complete pathological response, [id number]      CANCER STAGING: Cancer Staging Malignant neoplasm of overlapping sites of right breast in female, estrogen receptor positive ([place]) Staging form: Breast, AJCC 8th Edition - Clinical stage from 05/25/2017: Stage IIIB (cT4b, cN3a, cM0, G3, ER+, PR+, HER2+) - Signed by Holley Bouche, NP on 07/07/2017  Malignant neoplasm of overlapping sites of right female breast [facility]) Staging form: Breast, AJCC 8th Edition - Clinical: No Stage Recommended (ycT4b, cN3a, cM0, G3, ER: Positive, PR: Positive, HER2: Positive) - Signed by Twana First, MD on 05/25/2017    INTERVAL HISTORY:  Ms. Spieth 48 y.o. female seen for follow-up of right breast cancer.  Denies any new onset pains.  Appetite is 75%.  Energy levels are 75%.  Pain  in the left ankle is stable and controlled well with tramadol as needed.  She only takes tramadol at nighttime.  She is taking exemestane without any major problems.  Very rare hot flashes.  No musculoskeletal pains.  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  Denies any fevers, night sweats or weight loss.   REVIEW OF SYSTEMS:  Review of Systems  All other systems reviewed and are negative.     PAST MEDICAL/SURGICAL HISTORY:  Past Medical History:  Diagnosis Date  . Cancer [facility])    right breast cancer  . History of radiation therapy 12/03/17- 01/13/18   Right Chest wall and supraclavicular region treated to 50 Gy with 25 fx of 2 Gy followed by a boost of 10 Gy in 5 fx.   . Personal history of chemotherapy   . Personal history of radiation therapy    Past Surgical History:  Procedure Laterality Date  . BUNIONECTOMY Left 2000  . EXTERNAL FIXATION LEG Left 02/16/2017   Procedure: EXTERNAL FIXATION LEG;  Surgeon: Mcarthur Rossetti, MD;  Location: [place];  Service: Orthopedics;  Laterality: Left;  . HARDWARE REMOVAL Left 05/21/2017   Procedure: REMOVAL OF RETAINED PROMINENT SCREW LEFT ANKLE;  Surgeon: Mcarthur Rossetti, MD;  Location: [place];  Service: Orthopedics;  Laterality: Left;  . LAPAROSCOPIC BILATERAL SALPINGO OOPHERECTOMY Bilateral 03/16/2018   Procedure: LAPAROSCOPIC BILATERAL SALPINGO OOPHORECTOMY;  Surgeon: Jonnie Kind, MD;  Location: [facility];  Service: Gynecology;  Laterality: Bilateral;  . LAPAROSCOPIC LYSIS OF ADHESIONS N/A 03/16/2018   Procedure: LAPAROSCOPIC LYSIS OF ADHESIONS;  Surgeon: Jonnie Kind, MD;  Location: [facility];  Service: Gynecology;  Laterality: N/A;  . MASTECTOMY MODIFIED RADICAL Right 10/14/2017   Procedure: MASTECTOMY MODIFIED RADICAL;  Surgeon: Virl Cagey, MD;  Location: [facility];  Service: General;  Laterality: Right;  . ORIF ANKLE FRACTURE Left 03/03/2017  . ORIF ANKLE FRACTURE Left 03/03/2017   Procedure: OPEN REDUCTION INTERNAL FIXATION (ORIF) LEFT PILON FRACTURE AND LEFT LATERAL MALLEOLUS;  Surgeon: Mcarthur Rossetti, MD;  Location: [place];  Service: Orthopedics;  Laterality: Left;  . OVARIAN CYST REMOVAL Left 1991  . PORT-A-CATH REMOVAL Left 06/18/2018   Procedure: MINOR REMOVAL PORT-A-CATH;  Surgeon: Virl Cagey, MD;  Location: [facility];  Service: General;  Laterality: Left;  . PORTACATH PLACEMENT    . PORTACATH  PLACEMENT Left 05/20/2017   Procedure: INSERTION PORT-A-CATH;  Surgeon: Virl Cagey, MD;  Location: [facility];  Service: General;  Laterality: Left;  . WISDOM TOOTH EXTRACTION       SOCIAL HISTORY:  Social History   Socioeconomic History  . Marital status: Married    Spouse name: Not on file  . Number of children: Not on file  . Years of education: Not on file  . Highest education level: Not on file  Occupational History  . Not on file  Social Needs  . Financial resource strain: Not on file  . Food insecurity    Worry: Not on file    Inability: Not on file  . Transportation needs    Medical: Not on file    Non-medical: Not on file  Tobacco Use  . Smoking status: Former Smoker    Packs/day: 1.00    Years: 10.00    Pack years: 10.00    Types: Cigarettes    Quit date: 02/16/2017    Years since quitting: 2.0  . Smokeless tobacco: Never Used  Substance and Sexual Activity  .  Alcohol use: Yes    Comment: occasional  . Drug use: No  . Sexual activity: Yes    Birth control/protection: None  Lifestyle  . Physical activity    Days per week: Not on file    Minutes per session: Not on file  . Stress: Not on file  Relationships  . Social Herbalist on phone: Not on file    Gets together: Not on file    Attends religious service: Not on file    Active member of club or organization: Not on file    Attends meetings of clubs or organizations: Not on file    Relationship status: Not on file  . Intimate partner violence    Fear of current or ex partner: Not on file    Emotionally abused: Not on file    Physically abused: Not on file    Forced sexual activity: Not on file  Other Topics Concern  . Not on file  Social History Narrative  . Not on file    FAMILY HISTORY:  Family History  Problem Relation Age of Onset  . Colon cancer Father     CURRENT MEDICATIONS:  Outpatient Encounter Medications as of 03/16/2019  Medication Sig  . acetaminophen (TYLENOL)  500 MG tablet Take 1,000 mg by mouth daily as needed for moderate pain or headache.  . exemestane (AROMASIN) 25 MG tablet TAKE 1 TABLET BY MOUTH IN THE MORNING AFTER BREAKFAST.  Marland Kitchen traMADol (ULTRAM) 50 MG tablet TAKE (1) TABLET BY MOUTH EVERY SIX HOURS AS NEEDED FOR SEVERE PAIN.   No facility-administered encounter medications on file as of 03/16/2019.     ALLERGIES:  Allergies  Allergen Reactions  . Penicillins Nausea And Vomiting    Has patient had a PCN reaction causing immediate rash, facial/tongue/throat swelling, SOB or lightheadedness with hypotension: No Has patient had a PCN reaction causing severe rash involving mucus membranes or skin necrosis: No Has patient had a PCN reaction that required hospitalization: No Has patient had a PCN reaction occurring within the last 10 years: No If all of the above answers are "NO", then may proceed with Cephalosporin use.      PHYSICAL EXAM:  ECOG Performance status: 1  Blood pressure is 112/72, pulse rate 82, respiratory rate is 18, temperature 97.7, saturations 95%.  Physical Exam Constitutional:      Appearance: Normal appearance.  Cardiovascular:     Rate and Rhythm: Normal rate and regular rhythm.     Heart sounds: Normal heart sounds.  Pulmonary:     Effort: Pulmonary effort is normal.     Breath sounds: Normal breath sounds.  Abdominal:     General: There is no distension.     Palpations: Abdomen is soft. There is no mass.  Skin:    General: Skin is warm.  Neurological:     General: No focal deficit present.     Mental Status: She is alert and oriented to person, place, and time.  Psychiatric:        Mood and Affect: Mood normal.        Behavior: Behavior normal.   Right mastectomy site is within normal limits.  Left breast has no palpable mass.   LABORATORY DATA:  I have reviewed the labs as listed.  CBC    Component Value Date/Time   WBC 5.0 03/09/2019 1115   RBC 4.43 03/09/2019 1115   HGB 14.6 03/09/2019  1115   HCT 43.3 03/09/2019 1115  PLT 256 03/09/2019 1115   MCV 97.7 03/09/2019 1115   MCH 33.0 03/09/2019 1115   MCHC 33.7 03/09/2019 1115   RDW 13.2 03/09/2019 1115   LYMPHSABS 1.2 03/09/2019 1115   MONOABS 0.3 03/09/2019 1115   EOSABS 0.1 03/09/2019 1115   BASOSABS 0.0 03/09/2019 1115   CMP Latest Ref Rng & Units 03/09/2019 11/03/2018 07/05/2018  Glucose 70 - 99 mg/dL 99 94 91  BUN 6 - 20 mg/dL '9 9 10  ' Creatinine 0.44 - 1.00 mg/dL 0.74 0.82 0.81  Sodium 135 - 145 mmol/L 142 141 140  Potassium 3.5 - 5.1 mmol/L 3.6 4.1 3.7  Chloride 98 - 111 mmol/L 103 103 106  CO2 22 - 32 mmol/L '29 29 26  ' Calcium 8.9 - 10.3 mg/dL 10.1 10.0 9.6  Total Protein 6.5 - 8.1 g/dL 7.9 7.7 7.8  Total Bilirubin 0.3 - 1.2 mg/dL 0.9 0.8 0.9  Alkaline Phos 38 - 126 U/L 64 67 68  AST 15 - 41 U/L '18 17 19  ' ALT 0 - 44 U/L '14 12 15       ' DIAGNOSTIC IMAGING:  I have independently reviewed the scans and discussed with the patient.   I have reviewed Venita Lick LPN's note and agree with the documentation.  I personally performed a face-to-face visit, made revisions and my assessment and plan is as follows.    ASSESSMENT & PLAN:   Malignant neoplasm of overlapping sites of right female breast ([place]) 1.  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 -Bilateral oophorectomy done on 03/16/2018 -1 year of Herceptin and Pertuzumab completed on 05/26/2018. -Exemestane 25 mg daily started on 05/26/2018. -Left breast mammogram dated 06/14/2018 was BI-RADS Category 1. - She is tolerating exemestane very well.  We reviewed her labs which are within normal limits. - She will come back in 4 months for follow-up with repeat labs.  We will schedule her for mammogram in October.  2.  Left ankle pain: -This is  from a combination of prior surgery and slight worsening after exemestane. -She is taking tramadol in the evenings.      Orders placed this encounter:  Orders Placed This Encounter  Procedures  . CBC with Differential/Platelet  . Comprehensive metabolic panel  . Cancer antigen 15-3      Derek Jack, MD [place] [phone]

## 2019-03-16 NOTE — Patient Instructions (Signed)
[facility] at [facility] Discharge Instructions  You were seen today by Dr. Katragadda. He went over your recent lab results. He will see you back in 4 weeks for labs and follow up.   Thank you for choosing [facility] at [facility] to provide your oncology and hematology care.  To afford each patient quality time with our provider, please arrive at least 15 minutes before your scheduled appointment time.   If you have a lab appointment with the Cancer Center please come in thru the  Main Entrance and check in at the main information desk  You need to re-schedule your appointment should you arrive 10 or more minutes late.  We strive to give you quality time with our providers, and arriving late affects you and other patients whose appointments are after yours.  Also, if you no show three or more times for appointments you may be dismissed from the clinic at the providers discretion.     Again, thank you for choosing [facility].  Our hope is that these requests will decrease the amount of time that you wait before being seen by our physicians.       _____________________________________________________________  Should you have questions after your visit to [facility], please contact our office at [phone] between the hours of 8:00 a.m. and 4:30 p.m.  Voicemails left after 4:00 p.m. will not be returned until the following business day.  For prescription refill requests, have your pharmacy contact our office and allow 72 hours.    Cancer Center Support Programs:   > Cancer Support Group  2nd Tuesday of the month 1pm-2pm, Journey Room    

## 2019-03-28 ENCOUNTER — Other Ambulatory Visit (HOSPITAL_COMMUNITY): Payer: Self-pay | Admitting: Emergency Medicine

## 2019-03-28 DIAGNOSIS — G8929 Other chronic pain: Secondary | ICD-10-CM

## 2019-03-29 ENCOUNTER — Other Ambulatory Visit (HOSPITAL_COMMUNITY): Payer: Self-pay | Admitting: *Deleted

## 2019-03-29 DIAGNOSIS — G8929 Other chronic pain: Secondary | ICD-10-CM

## 2019-03-29 MED ORDER — TRAMADOL HCL 50 MG PO TABS: ORAL_TABLET | ORAL | 0 refills | Status: DC

## 2019-04-13 ENCOUNTER — Other Ambulatory Visit (HOSPITAL_COMMUNITY): Payer: Self-pay | Admitting: *Deleted

## 2019-04-13 DIAGNOSIS — G8929 Other chronic pain: Secondary | ICD-10-CM

## 2019-04-13 MED ORDER — TRAMADOL HCL 50 MG PO TABS: ORAL_TABLET | ORAL | 0 refills | Status: DC

## 2019-04-26 ENCOUNTER — Other Ambulatory Visit (HOSPITAL_COMMUNITY): Payer: Self-pay | Admitting: *Deleted

## 2019-04-26 DIAGNOSIS — M25572 Pain in left ankle and joints of left foot: Secondary | ICD-10-CM

## 2019-04-26 DIAGNOSIS — G8929 Other chronic pain: Secondary | ICD-10-CM

## 2019-04-26 MED ORDER — TRAMADOL HCL 50 MG PO TABS: ORAL_TABLET | ORAL | 0 refills | Status: DC

## 2019-05-13 ENCOUNTER — Other Ambulatory Visit (HOSPITAL_COMMUNITY): Payer: Self-pay | Admitting: Hematology

## 2019-05-13 DIAGNOSIS — M25572 Pain in left ankle and joints of left foot: Secondary | ICD-10-CM

## 2019-05-13 DIAGNOSIS — G8929 Other chronic pain: Secondary | ICD-10-CM

## 2019-05-30 ENCOUNTER — Other Ambulatory Visit (HOSPITAL_COMMUNITY): Payer: Self-pay | Admitting: Hematology

## 2019-05-30 DIAGNOSIS — M25572 Pain in left ankle and joints of left foot: Secondary | ICD-10-CM

## 2019-05-30 DIAGNOSIS — G8929 Other chronic pain: Secondary | ICD-10-CM

## 2019-06-13 ENCOUNTER — Other Ambulatory Visit (HOSPITAL_COMMUNITY): Payer: Self-pay | Admitting: Hematology

## 2019-06-13 DIAGNOSIS — M25572 Pain in left ankle and joints of left foot: Secondary | ICD-10-CM

## 2019-06-13 DIAGNOSIS — G8929 Other chronic pain: Secondary | ICD-10-CM

## 2019-06-17 ENCOUNTER — Ambulatory Visit (HOSPITAL_COMMUNITY): Payer: Self-pay

## 2019-07-07 ENCOUNTER — Ambulatory Visit (HOSPITAL_COMMUNITY)
Admission: RE | Admit: 2019-07-07 | Discharge: 2019-07-07 | Disposition: A | Discharge status: Home / Self Care | Payer: Self-pay | Source: Ambulatory Visit | Attending: Hematology | Admitting: Hematology

## 2019-07-07 ENCOUNTER — Other Ambulatory Visit: Payer: Self-pay

## 2019-07-07 DIAGNOSIS — Z1239 Encounter for other screening for malignant neoplasm of breast: Secondary | ICD-10-CM

## 2019-07-07 DIAGNOSIS — Z1231 Encounter for screening mammogram for malignant neoplasm of breast: Secondary | ICD-10-CM | POA: Insufficient documentation

## 2019-07-11 ENCOUNTER — Other Ambulatory Visit (HOSPITAL_COMMUNITY): Payer: Self-pay | Admitting: Hematology

## 2019-07-11 DIAGNOSIS — R928 Other abnormal and inconclusive findings on diagnostic imaging of breast: Secondary | ICD-10-CM

## 2019-07-12 ENCOUNTER — Ambulatory Visit (HOSPITAL_COMMUNITY)
Admission: RE | Admit: 2019-07-12 | Discharge: 2019-07-12 | Disposition: A | Discharge status: Home / Self Care | Payer: Self-pay | Source: Ambulatory Visit | Attending: Hematology | Admitting: Hematology

## 2019-07-12 ENCOUNTER — Other Ambulatory Visit: Payer: Self-pay

## 2019-07-12 DIAGNOSIS — R928 Other abnormal and inconclusive findings on diagnostic imaging of breast: Secondary | ICD-10-CM | POA: Insufficient documentation

## 2019-07-18 ENCOUNTER — Inpatient Hospital Stay (HOSPITAL_COMMUNITY): Payer: Self-pay | Attending: Hematology

## 2019-07-18 ENCOUNTER — Other Ambulatory Visit (HOSPITAL_COMMUNITY): Payer: Self-pay | Admitting: Surgery

## 2019-07-18 DIAGNOSIS — G8929 Other chronic pain: Secondary | ICD-10-CM

## 2019-07-18 MED ORDER — TRAMADOL HCL 50 MG PO TABS: 50.0000 mg | ORAL_TABLET | Freq: Four times a day (QID) | ORAL | 1 refills | Status: DC | PRN

## 2019-07-19 ENCOUNTER — Encounter (HOSPITAL_COMMUNITY): Payer: Self-pay

## 2019-07-19 ENCOUNTER — Other Ambulatory Visit (HOSPITAL_COMMUNITY): Payer: Self-pay

## 2019-07-25 ENCOUNTER — Ambulatory Visit (HOSPITAL_COMMUNITY): Payer: Self-pay | Admitting: Hematology

## 2019-07-27 ENCOUNTER — Inpatient Hospital Stay (HOSPITAL_COMMUNITY): Payer: Self-pay | Attending: Hematology

## 2019-07-27 ENCOUNTER — Other Ambulatory Visit: Payer: Self-pay

## 2019-07-27 ENCOUNTER — Ambulatory Visit (HOSPITAL_COMMUNITY): Payer: Self-pay | Admitting: Hematology

## 2019-07-27 DIAGNOSIS — M25572 Pain in left ankle and joints of left foot: Secondary | ICD-10-CM | POA: Insufficient documentation

## 2019-07-27 DIAGNOSIS — Z79811 Long term (current) use of aromatase inhibitors: Secondary | ICD-10-CM | POA: Insufficient documentation

## 2019-07-27 DIAGNOSIS — Z9221 Personal history of antineoplastic chemotherapy: Secondary | ICD-10-CM | POA: Insufficient documentation

## 2019-07-27 DIAGNOSIS — G479 Sleep disorder, unspecified: Secondary | ICD-10-CM | POA: Insufficient documentation

## 2019-07-27 DIAGNOSIS — C773 Secondary and unspecified malignant neoplasm of axilla and upper limb lymph nodes: Secondary | ICD-10-CM

## 2019-07-27 DIAGNOSIS — C50811 Malignant neoplasm of overlapping sites of right female breast: Secondary | ICD-10-CM | POA: Insufficient documentation

## 2019-07-27 DIAGNOSIS — Z170 Estrogen receptor positive status [ER+]: Secondary | ICD-10-CM | POA: Insufficient documentation

## 2019-07-27 DIAGNOSIS — Z87891 Personal history of nicotine dependence: Secondary | ICD-10-CM | POA: Insufficient documentation

## 2019-07-27 DIAGNOSIS — Z79899 Other long term (current) drug therapy: Secondary | ICD-10-CM | POA: Insufficient documentation

## 2019-07-27 DIAGNOSIS — C50911 Malignant neoplasm of unspecified site of right female breast: Secondary | ICD-10-CM

## 2019-07-27 DIAGNOSIS — Z9011 Acquired absence of right breast and nipple: Secondary | ICD-10-CM | POA: Insufficient documentation

## 2019-07-27 DIAGNOSIS — Z923 Personal history of irradiation: Secondary | ICD-10-CM | POA: Insufficient documentation

## 2019-07-27 LAB — CBC WITH DIFFERENTIAL/PLATELET
Abs Immature Granulocytes: 0.01 10*3/uL (ref 0.00–0.07)
Basophils Absolute: 0 10*3/uL (ref 0.0–0.1)
Basophils Relative: 1 %
Eosinophils Absolute: 0.1 10*3/uL (ref 0.0–0.5)
Eosinophils Relative: 2 %
HCT: 39.2 % (ref 36.0–46.0)
Hemoglobin: 13.2 g/dL (ref 12.0–15.0)
Immature Granulocytes: 0 %
Lymphocytes Relative: 26 %
Lymphs Abs: 1.5 10*3/uL (ref 0.7–4.0)
MCH: 33.3 pg (ref 26.0–34.0)
MCHC: 33.7 g/dL (ref 30.0–36.0)
MCV: 99 fL (ref 80.0–100.0)
Monocytes Absolute: 0.4 10*3/uL (ref 0.1–1.0)
Monocytes Relative: 6 %
Neutro Abs: 3.7 10*3/uL (ref 1.7–7.7)
Neutrophils Relative %: 65 %
Platelets: 230 10*3/uL (ref 150–400)
RBC: 3.96 MIL/uL (ref 3.87–5.11)
RDW: 13.2 % (ref 11.5–15.5)
WBC: 5.7 10*3/uL (ref 4.0–10.5)
nRBC: 0 % (ref 0.0–0.2)

## 2019-07-27 LAB — COMPREHENSIVE METABOLIC PANEL
ALT: 14 U/L (ref 0–44)
AST: 17 U/L (ref 15–41)
Albumin: 4.4 g/dL (ref 3.5–5.0)
Alkaline Phosphatase: 58 U/L (ref 38–126)
Anion gap: 10 (ref 5–15)
BUN: 8 mg/dL (ref 6–20)
CO2: 28 mmol/L (ref 22–32)
Calcium: 9.8 mg/dL (ref 8.9–10.3)
Chloride: 102 mmol/L (ref 98–111)
Creatinine, Ser: 0.74 mg/dL (ref 0.44–1.00)
GFR calc Af Amer: >60 mL/min (ref >60)
GFR calc non Af Amer: >60 mL/min (ref >60)
Glucose, Bld: 96 mg/dL (ref 70–99)
Potassium: 3.8 mmol/L (ref 3.5–5.1)
Sodium: 140 mmol/L (ref 135–145)
Total Bilirubin: 0.7 mg/dL (ref 0.3–1.2)
Total Protein: 7.1 g/dL (ref 6.5–8.1)

## 2019-07-28 LAB — CANCER ANTIGEN 15-3: CA 15-3: 9.7 U/mL (ref 0.0–25.0)

## 2019-08-02 ENCOUNTER — Encounter (HOSPITAL_COMMUNITY): Payer: Self-pay | Admitting: Hematology

## 2019-08-02 ENCOUNTER — Inpatient Hospital Stay (HOSPITAL_BASED_OUTPATIENT_CLINIC_OR_DEPARTMENT_OTHER): Payer: Self-pay | Admitting: Hematology

## 2019-08-02 ENCOUNTER — Other Ambulatory Visit: Payer: Self-pay

## 2019-08-02 VITALS — BP 110/77 | HR 90 | Temp 97.1°F | Resp 18 | Wt 117.0 lb

## 2019-08-02 DIAGNOSIS — C50911 Malignant neoplasm of unspecified site of right female breast: Secondary | ICD-10-CM

## 2019-08-02 DIAGNOSIS — C50811 Malignant neoplasm of overlapping sites of right female breast: Secondary | ICD-10-CM

## 2019-08-02 DIAGNOSIS — Z170 Estrogen receptor positive status [ER+]: Secondary | ICD-10-CM

## 2019-08-02 DIAGNOSIS — C773 Secondary and unspecified malignant neoplasm of axilla and upper limb lymph nodes: Secondary | ICD-10-CM

## 2019-08-02 NOTE — Progress Notes (Signed)
[place], [place] [address]   CLINIC:  Medical Oncology/Hematology  PCP:  Patient, No Pcp Per No address on file None   REASON FOR VISIT:  Follow-up for breast cancer, ER+/PR+/HER2+  CURRENT THERAPY:  Exemestane 25 mg daily.   BRIEF ONCOLOGIC HISTORY:  Oncology History  Malignant neoplasm of overlapping sites of right breast in female, estrogen receptor positive ([place])  05/12/2017 Initial Biopsy   (R) breast (LIQ): IDC, grade 3; ER+ (80%), PR+ (10%), HER2+ (ratio 2.56). Ki67 80%.     05/12/2017 Procedure   Additional (R) breast biopsy (UOQ): IDC, grade 3; ER+ (80%), PR+ (50%), HER2+ (ratio 2.77), Ki67 90%.    05/12/2017 Procedure   (R) axillary lymph node biopsy: Metastatic carcinoma; ER+ (80%), PR+ (30%), HER2+ (ratio 2.99), Ki67 80%.    05/12/2017 Imaging   Mammogram/breast US: Targeted ultrasound is performed, showing multiple irregular hypoechoic masses. In the 6 o'clock region of the right breast is a large irregular mass spans at least 4.8 cm. In the 4:00 to 5:00 region of the right breast an irregular mass measures 4.5 cm. In the 930-10 o'clock position of the right breast centered 9-10 cm from the nipple is an irregular 4.0 x 3.8 x 1.9 cm hypoechoic mass. In the 9:30 position of the right breast at the lateral margin is a probable abnormal intramammary lymph node measuring 1.3 cm greatest diameter. In the 10 o'clock position of the right breast far lateral is a hypoechoic dermal nodule that measures 0.5 cm. Diffuse skin thickening of the right breast measures up to 4.5 mm.   05/14/2017 Tumor Marker     Ref. Range 05/14/2017 15:30  CA 15-3 Latest Ref Range: 0.0 - 25.0 U/mL 115.9 (H)     Ref. Range 05/14/2017 15:30  CA 27.29 Latest Ref Range: 0.0 - 38.6 U/mL 157.7 (H)     05/15/2017 Imaging   CT chest/abd/pelvis:  IMPRESSION: 1. Numerous right breast masses and metastatic right axillary lymphadenopathy. Findings suspicious for  chest wall invasion along the lower lateral aspect of the breast. 2. Two small indeterminate right lower lobe pulmonary nodules will require surveillance. 3. No findings for metastatic disease involving the abdomen/pelvis or osseous structures.   05/15/2017 Imaging   Bone scan:  IMPRESSION: No evidence of metastatic disease.   05/18/2017 Echocardiogram   LV EF: 60% -   65%   05/20/2017 Procedure   Port-a-cath placement    05/25/2017 Initial Diagnosis   Malignant neoplasm of overlapping sites of right breast in female, estrogen receptor positive ([place])   05/27/2017 - 09/10/2017 Neo-Adjuvant Chemotherapy   TCHPx 6 cycles   10/14/2017 Surgery   Right modified radical mastectomy and lymph node biopsy on 10/14/2017, with complete pathological response, [id number]      CANCER STAGING: Cancer Staging Malignant neoplasm of overlapping sites of right breast in female, estrogen receptor positive ([place]) Staging form: Breast, AJCC 8th Edition - Clinical stage from 05/25/2017: Stage IIIB (cT4b, cN3a, cM0, G3, ER+, PR+, HER2+) - Signed by Holley Bouche, NP on 07/07/2017  Malignant neoplasm of overlapping sites of right female breast [facility]) Staging form: Breast, AJCC 8th Edition - Clinical: No Stage Recommended (ycT4b, cN3a, cM0, G3, ER: Positive, PR: Positive, HER2: Positive) - Signed by Twana First, MD on 05/25/2017    INTERVAL HISTORY:  Angel Hale 48 y.o. female seen for follow-up of right breast cancer.  Denies any new onset pains.  She is tolerating exemestane reasonably well.  She had mammogram  in November and had to have repeat views of the left breast due to some abnormality.  She is distraught about it.  Appetite is 75%.  Energy levels are 100%.  No new pains reported.  Left ankle pain is stable.  She takes tramadol as needed.   REVIEW OF SYSTEMS:  Review of Systems  Psychiatric/Behavioral: Positive for sleep disturbance.  All other systems reviewed and are negative.    PAST  MEDICAL/SURGICAL HISTORY:  Past Medical History:  Diagnosis Date  . Cancer [facility])    right breast cancer  . History of radiation therapy 12/03/17- 01/13/18   Right Chest wall and supraclavicular region treated to 50 Gy with 25 fx of 2 Gy followed by a boost of 10 Gy in 5 fx.   . Personal history of chemotherapy   . Personal history of radiation therapy    Past Surgical History:  Procedure Laterality Date  . BUNIONECTOMY Left 2000  . EXTERNAL FIXATION LEG Left 02/16/2017   Procedure: EXTERNAL FIXATION LEG;  Surgeon: Mcarthur Rossetti, MD;  Location: [facility];  Service: Orthopedics;  Laterality: Left;  . HARDWARE REMOVAL Left 05/21/2017   Procedure: REMOVAL OF RETAINED PROMINENT SCREW LEFT ANKLE;  Surgeon: Mcarthur Rossetti, MD;  Location: [facility];  Service: Orthopedics;  Laterality: Left;  . LAPAROSCOPIC BILATERAL SALPINGO OOPHERECTOMY Bilateral 03/16/2018   Procedure: LAPAROSCOPIC BILATERAL SALPINGO OOPHORECTOMY;  Surgeon: Jonnie Kind, MD;  Location: [facility];  Service: Gynecology;  Laterality: Bilateral;  . LAPAROSCOPIC LYSIS OF ADHESIONS N/A 03/16/2018   Procedure: LAPAROSCOPIC LYSIS OF ADHESIONS;  Surgeon: Jonnie Kind, MD;  Location: [facility];  Service: Gynecology;  Laterality: N/A;  . MASTECTOMY MODIFIED RADICAL Right 10/14/2017   Procedure: MASTECTOMY MODIFIED RADICAL;  Surgeon: Virl Cagey, MD;  Location: [facility];  Service: General;  Laterality: Right;  . ORIF ANKLE FRACTURE Left 03/03/2017  . ORIF ANKLE FRACTURE Left 03/03/2017   Procedure: OPEN REDUCTION INTERNAL FIXATION (ORIF) LEFT PILON FRACTURE AND LEFT LATERAL MALLEOLUS;  Surgeon: Mcarthur Rossetti, MD;  Location: [facility];  Service: Orthopedics;  Laterality: Left;  . OVARIAN CYST REMOVAL Left 1991  . PORT-A-CATH REMOVAL Left 06/18/2018   Procedure: MINOR REMOVAL PORT-A-CATH;  Surgeon: Virl Cagey, MD;  Location: [facility];  Service: General;  Laterality: Left;  . PORTACATH PLACEMENT    . PORTACATH  PLACEMENT Left 05/20/2017   Procedure: INSERTION PORT-A-CATH;  Surgeon: Virl Cagey, MD;  Location: [facility];  Service: General;  Laterality: Left;  . WISDOM TOOTH EXTRACTION       SOCIAL HISTORY:  Social History   Socioeconomic History  . Marital status: Married    Spouse name: Not on file  . Number of children: Not on file  . Years of education: Not on file  . Highest education level: Not on file  Occupational History  . Not on file  Social Needs  . Financial resource strain: Not on file  . Food insecurity    Worry: Not on file    Inability: Not on file  . Transportation needs    Medical: Not on file    Non-medical: Not on file  Tobacco Use  . Smoking status: Former Smoker    Packs/day: 1.00    Years: 10.00    Pack years: 10.00    Types: Cigarettes    Quit date: 02/16/2017    Years since quitting: 2.4  . Smokeless tobacco: Never Used  Substance and Sexual Activity  . Alcohol use: Yes  Comment: occasional  . Drug use: No  . Sexual activity: Yes    Birth control/protection: None  Lifestyle  . Physical activity    Days per week: Not on file    Minutes per session: Not on file  . Stress: Not on file  Relationships  . Social Herbalist on phone: Not on file    Gets together: Not on file    Attends religious service: Not on file    Active member of club or organization: Not on file    Attends meetings of clubs or organizations: Not on file    Relationship status: Not on file  . Intimate partner violence    Fear of current or ex partner: Not on file    Emotionally abused: Not on file    Physically abused: Not on file    Forced sexual activity: Not on file  Other Topics Concern  . Not on file  Social History Narrative  . Not on file    FAMILY HISTORY:  Family History  Problem Relation Age of Onset  . Colon cancer Father     CURRENT MEDICATIONS:  Outpatient Encounter Medications as of 08/02/2019  Medication Sig  . exemestane (AROMASIN)  25 MG tablet TAKE 1 TABLET BY MOUTH IN THE MORNING AFTER BREAKFAST.  Marland Kitchen acetaminophen (TYLENOL) 500 MG tablet Take 1,000 mg by mouth daily as needed for moderate pain or headache.  . traMADol (ULTRAM) 50 MG tablet Take 1 tablet (50 mg total) by mouth every 6 (six) hours as needed. (Patient not taking: Reported on 08/02/2019)   No facility-administered encounter medications on file as of 08/02/2019.     ALLERGIES:  Allergies  Allergen Reactions  . Penicillins Nausea And Vomiting    Has patient had a PCN reaction causing immediate rash, facial/tongue/throat swelling, SOB or lightheadedness with hypotension: No Has patient had a PCN reaction causing severe rash involving mucus membranes or skin necrosis: No Has patient had a PCN reaction that required hospitalization: No Has patient had a PCN reaction occurring within the last 10 years: No If all of the above answers are "NO", then may proceed with Cephalosporin use.      PHYSICAL EXAM:  ECOG Performance status: 1  Blood pressure is 112/72, pulse rate 82, respiratory rate is 18, temperature 97.7, saturations 95%.  Physical Exam Constitutional:      Appearance: Normal appearance.  Cardiovascular:     Rate and Rhythm: Normal rate and regular rhythm.     Heart sounds: Normal heart sounds.  Pulmonary:     Effort: Pulmonary effort is normal.     Breath sounds: Normal breath sounds.  Abdominal:     General: There is no distension.     Palpations: Abdomen is soft. There is no mass.  Skin:    General: Skin is warm.  Neurological:     General: No focal deficit present.     Mental Status: She is alert and oriented to person, place, and time.  Psychiatric:        Mood and Affect: Mood normal.        Behavior: Behavior normal.   Right mastectomy site is within normal limits.  Left breast has no palpable mass.   LABORATORY DATA:  I have reviewed the labs as listed.  CBC    Component Value Date/Time   WBC 5.7 07/27/2019 0912   RBC  3.96 07/27/2019 0912   HGB 13.2 07/27/2019 0912   HCT 39.2 07/27/2019 0912  PLT 230 07/27/2019 0912   MCV 99.0 07/27/2019 0912   MCH 33.3 07/27/2019 0912   MCHC 33.7 07/27/2019 0912   RDW 13.2 07/27/2019 0912   LYMPHSABS 1.5 07/27/2019 0912   MONOABS 0.4 07/27/2019 0912   EOSABS 0.1 07/27/2019 0912   BASOSABS 0.0 07/27/2019 0912   CMP Latest Ref Rng & Units 07/27/2019 03/09/2019 11/03/2018  Glucose 70 - 99 mg/dL 96 99 94  BUN 6 - 20 mg/dL _0 Creatinine 0.44 - 1.00 mg/dL 0.74 0.74 0.82  Sodium 135 - 145 mmol/L 140 142 141  Potassium 3.5 - 5.1 mmol/L 3.8 3.6 4.1  Chloride 98 - 111 mmol/L 102 103 103  CO2 22 - 32 mmol/L _1 Calcium 8.9 - 10.3 mg/dL 9.8 10.1 10.0  Total Protein 6.5 - 8.1 g/dL 7.1 7.9 7.7  Total Bilirubin 0.3 - 1.2 mg/dL 0.7 0.9 0.8  Alkaline Phos 38 - 126 U/L 58 64 67  AST 15 - 41 U/L _2 ALT 0 - 44 U/L _3 DIAGNOSTIC IMAGING:  I have independently reviewed the scans and discussed with the patient.   I have reviewed Venita Lick LPN's note and agree with the documentation.  I personally performed a face-to-face visit, made revisions and my assessment and plan is as follows.    ASSESSMENT & PLAN:   Malignant neoplasm of overlapping sites of right female breast ([place]) 1.  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 -Bilateral oophorectomy done on 03/16/2018 -1 year of Herceptin and Pertuzumab completed on 05/26/2018. -Exemestane 25 mg daily started on 05/26/2018. -She is continuing to tolerate exemestane reasonably well. -She had mammogram in November and had to have additional views.  Final mammogram on 07/25/2019 was BI-RADS Category 1. -Physical exam today did not reveal any palpable masses at the mastectomy site or in  the left breast.  We reviewed her lab work which is grossly within normal limits.  Tumor marker was also within normal limits. -She will come back in 4 months for follow-up.  We will check vitamin D level in addition to routine labs.  2.  Left ankle pain: -This is from a combination of prior surgery and slight worsening after exemestane. -She is taking tramadol in the evenings.      Orders placed this encounter:  Orders Placed This Encounter  Procedures  . CBC with Differential/Platelet  . Comprehensive metabolic panel  . Cancer antigen 15-3  . Vitamin D 25 hydroxy      Derek Jack, MD [place] [phone]

## 2019-08-02 NOTE — Patient Instructions (Signed)
[facility] °Discharge Instructions ° °You were seen today by Dr. Katragadda. He went over your recent test results. He will see you back in 4 months for labs and follow up. ° ° °Thank you for choosing [facility] at [facility] to provide your oncology and hematology care.  To afford each patient quality time with our provider, please arrive at least 15 minutes before your scheduled appointment time.  ° °If you have a lab appointment with the Cancer Center please come in thru the  °Main Entrance and check in at the main information desk ° °You need to re-schedule your appointment should you arrive 10 or more minutes late.  We strive to give you quality time with our providers, and arriving late affects you and other patients whose appointments are after yours.  Also, if you no show three or more times for appointments you may be dismissed from the clinic at the providers discretion.     °Again, thank you for choosing [facility].  Our hope is that these requests will decrease the amount of time that you wait before being seen by our physicians.       °_____________________________________________________________ ° °Should you have questions after your visit to [facility], please contact our office at [phone] between the hours of 8:00 a.m. and 4:30 p.m.  Voicemails left after 4:00 p.m. will not be returned until the following business day.  For prescription refill requests, have your pharmacy contact our office and allow 72 hours.   ° °Cancer Center Support Programs:  ° °> Cancer Support Group  °2nd Tuesday of the month 1pm-2pm, Journey Room  ° ° °

## 2019-08-02 NOTE — Assessment & Plan Note (Addendum)
1.  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 -Bilateral oophorectomy done on 03/16/2018 -1 year of Herceptin and Pertuzumab completed on 05/26/2018. -Exemestane 25 mg daily started on 05/26/2018. -She is continuing to tolerate exemestane reasonably well. -She had mammogram in November and had to have additional views.  Final mammogram on 07/25/2019 was BI-RADS Category 1. -Physical exam today did not reveal any palpable masses at the mastectomy site or in the left breast.  We reviewed her lab work which is grossly within normal limits.  Tumor marker was also within normal limits. -She will come back in 4 months for follow-up.  We will check vitamin D level in addition to routine labs.  2.  Left ankle pain: -This is from a combination of prior surgery and slight worsening after exemestane. -She is taking tramadol in the evenings.

## 2019-08-05 ENCOUNTER — Other Ambulatory Visit (HOSPITAL_COMMUNITY): Payer: Self-pay | Admitting: Hematology

## 2019-08-05 DIAGNOSIS — M25572 Pain in left ankle and joints of left foot: Secondary | ICD-10-CM

## 2019-08-22 ENCOUNTER — Other Ambulatory Visit (HOSPITAL_COMMUNITY): Payer: Self-pay | Admitting: *Deleted

## 2019-10-21 ENCOUNTER — Other Ambulatory Visit (HOSPITAL_COMMUNITY): Payer: Self-pay | Admitting: Hematology

## 2019-10-21 DIAGNOSIS — G8929 Other chronic pain: Secondary | ICD-10-CM

## 2019-10-21 DIAGNOSIS — M25572 Pain in left ankle and joints of left foot: Secondary | ICD-10-CM

## 2019-11-29 ENCOUNTER — Inpatient Hospital Stay (HOSPITAL_COMMUNITY): Payer: Self-pay | Attending: Hematology

## 2019-12-06 ENCOUNTER — Inpatient Hospital Stay (HOSPITAL_COMMUNITY): Payer: Self-pay | Admitting: Hematology

## 2020-01-18 ENCOUNTER — Other Ambulatory Visit (HOSPITAL_COMMUNITY): Payer: Self-pay | Admitting: Hematology

## 2020-01-18 DIAGNOSIS — C50911 Malignant neoplasm of unspecified site of right female breast: Secondary | ICD-10-CM

## 2020-08-12 ENCOUNTER — Other Ambulatory Visit: Payer: Self-pay

## 2020-08-12 ENCOUNTER — Emergency Department (HOSPITAL_COMMUNITY): Payer: Self-pay

## 2020-08-12 ENCOUNTER — Encounter (HOSPITAL_COMMUNITY): Payer: Self-pay

## 2020-08-12 ENCOUNTER — Emergency Department (HOSPITAL_COMMUNITY)
Admission: EM | Admit: 2020-08-12 | Discharge: 2020-08-12 | Disposition: A | Discharge status: Home / Self Care | Payer: Self-pay | Attending: Emergency Medicine | Admitting: Emergency Medicine

## 2020-08-12 DIAGNOSIS — R002 Palpitations: Secondary | ICD-10-CM | POA: Insufficient documentation

## 2020-08-12 DIAGNOSIS — Z87891 Personal history of nicotine dependence: Secondary | ICD-10-CM | POA: Insufficient documentation

## 2020-08-12 DIAGNOSIS — R000 Tachycardia, unspecified: Secondary | ICD-10-CM | POA: Insufficient documentation

## 2020-08-12 LAB — CBC
HCT: 46.4 % — ABNORMAL HIGH (ref 36.0–46.0)
Hemoglobin: 15.6 g/dL — ABNORMAL HIGH (ref 12.0–15.0)
MCH: 32.4 pg (ref 26.0–34.0)
MCHC: 33.6 g/dL (ref 30.0–36.0)
MCV: 96.3 fL (ref 80.0–100.0)
Platelets: 278 10*3/uL (ref 150–400)
RBC: 4.82 MIL/uL (ref 3.87–5.11)
RDW: 12.4 % (ref 11.5–15.5)
WBC: 9 10*3/uL (ref 4.0–10.5)
nRBC: 0 % (ref 0.0–0.2)

## 2020-08-12 LAB — BASIC METABOLIC PANEL
Anion gap: 14 (ref 5–15)
BUN: 6 mg/dL (ref 6–20)
CO2: 25 mmol/L (ref 22–32)
Calcium: 10.2 mg/dL (ref 8.9–10.3)
Chloride: 102 mmol/L (ref 98–111)
Creatinine, Ser: 0.79 mg/dL (ref 0.44–1.00)
GFR, Estimated: >60 mL/min (ref >60)
Glucose, Bld: 115 mg/dL — ABNORMAL HIGH (ref 70–99)
Potassium: 3.9 mmol/L (ref 3.5–5.1)
Sodium: 141 mmol/L (ref 135–145)

## 2020-08-12 LAB — I-STAT BETA HCG BLOOD, ED (MC, WL, AP ONLY): I-stat hCG, quantitative: <5 m[IU]/mL (ref <5)

## 2020-08-12 LAB — TROPONIN I (HIGH SENSITIVITY)
Troponin I (High Sensitivity): 3 ng/L (ref <18)
Troponin I (High Sensitivity): 3 ng/L (ref <18)

## 2020-08-12 LAB — D-DIMER, QUANTITATIVE: D-Dimer, Quant: <0.27 ug/mL-FEU (ref 0.00–0.50)

## 2020-08-12 LAB — TSH: TSH: 1.321 u[IU]/mL (ref 0.350–4.500)

## 2020-08-12 MED ORDER — IOHEXOL 350 MG/ML SOLN
75.0000 mL | Freq: Once | INTRAVENOUS | Status: AC | PRN
  Administered 2020-08-12: 75 mL via INTRAVENOUS

## 2020-08-12 MED ORDER — SODIUM CHLORIDE 0.9 % IV BOLUS
1000.0000 mL | Freq: Once | INTRAVENOUS | Status: AC
  Administered 2020-08-12: 16:00:00 1000 mL via INTRAVENOUS

## 2020-08-12 MED ORDER — SODIUM CHLORIDE 0.9 % IV BOLUS
1000.0000 mL | Freq: Once | INTRAVENOUS | Status: AC
  Administered 2020-08-12: 17:00:00 1000 mL via INTRAVENOUS

## 2020-08-12 NOTE — ED Provider Notes (Signed)
[facility] EMERGENCY DEPARTMENT Provider Note   CSN: [id number] Arrival date & time: 08/12/20  1002     History No chief complaint on file.   Angel Hale is a 49 y.o. female.  Patient with history of breast cancer status post chemotherapy and radiation.  Not currently being treated.  Here with sensation of heart fluttering beating fast for the past 2 to 3 weeks.  States when she wakes up in the morning she feels her heart beating fast and fluttering which lasts for several hours and seems to improve as the day goes on.  She denies any chest pain or shortness of breath with this.  She denies any cough or fever.  States this is happening on a daily basis and today she came in because is not getting better and she is worried something could be going on.  She cut out caffeine intake and this could be related Ms. had not had any caffeine for the past 2 weeks.  She normally drinks a pot of coffee a day.  She currently takes no medications.  Denies any cardiac history.  Denies any beta-blocker use.  Admits to having a lot of anxiety.  No thoughts of self-harm. Today her husband states that she felt she was going to pass out due to her heart beating so fast.  The history is provided by the patient.       Past Medical History:  Diagnosis Date  . Cancer [facility])    right breast cancer  . History of radiation therapy 12/03/17- 01/13/18   Right Chest wall and supraclavicular region treated to 50 Gy with 25 fx of 2 Gy followed by a boost of 10 Gy in 5 fx.   . Personal history of chemotherapy   . Personal history of radiation therapy     Patient Active Problem List   Diagnosis Date Noted  . Port-A-Cath in place 06/15/2018  . S/P bilateral salpingo-oophorectomy 03/16/2018  . Intra-abdominal adhesions   . Routine cervical smear 02/11/2018  . 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 Breast cancer metastasized  to axillary lymph node, right ([place]) 10/14/2017  . Malignant neoplasm of overlapping sites of right breast in female, estrogen receptor positive ([place]) 05/25/2017  . Painful orthopaedic hardware ([place]) left ankle 05/21/2017  . Malignant neoplasm of overlapping sites of right female breast ([place]) 05/14/2017  . Breast mass, right 05/12/2017  . Closed left pilon fracture, initial encounter 02/16/2017    Past Surgical History:  Procedure Laterality Date  . BUNIONECTOMY Left 2000  . EXTERNAL FIXATION LEG Left 02/16/2017   Procedure: EXTERNAL FIXATION LEG;  Surgeon: Mcarthur Rossetti, MD;  Location: [place];  Service: Orthopedics;  Laterality: Left;  . HARDWARE REMOVAL Left 05/21/2017   Procedure: REMOVAL OF RETAINED PROMINENT SCREW LEFT ANKLE;  Surgeon: Mcarthur Rossetti, MD;  Location: [place];  Service: Orthopedics;  Laterality: Left;  . LAPAROSCOPIC BILATERAL SALPINGO OOPHERECTOMY Bilateral 03/16/2018   Procedure: LAPAROSCOPIC BILATERAL SALPINGO OOPHORECTOMY;  Surgeon: Jonnie Kind, MD;  Location: [facility];  Service: Gynecology;  Laterality: Bilateral;  . LAPAROSCOPIC LYSIS OF ADHESIONS N/A 03/16/2018   Procedure: LAPAROSCOPIC LYSIS OF ADHESIONS;  Surgeon: Jonnie Kind, MD;  Location: [facility];  Service: Gynecology;  Laterality: N/A;  . MASTECTOMY MODIFIED RADICAL Right 10/14/2017   Procedure: MASTECTOMY MODIFIED RADICAL;  Surgeon: Virl Cagey, MD;  Location: [facility];  Service: General;  Laterality: Right;  .  ORIF ANKLE FRACTURE Left 03/03/2017  . ORIF ANKLE FRACTURE Left 03/03/2017   Procedure: OPEN REDUCTION INTERNAL FIXATION (ORIF) LEFT PILON FRACTURE AND LEFT LATERAL MALLEOLUS;  Surgeon: Mcarthur Rossetti, MD;  Location: [place];  Service: Orthopedics;  Laterality: Left;  . OVARIAN CYST REMOVAL Left 1991  . PORT-A-CATH REMOVAL Left 06/18/2018   Procedure: MINOR REMOVAL PORT-A-CATH;  Surgeon: Virl Cagey, MD;  Location: [facility];  Service: General;  Laterality: Left;  .  PORTACATH PLACEMENT    . PORTACATH PLACEMENT Left 05/20/2017   Procedure: INSERTION PORT-A-CATH;  Surgeon: Virl Cagey, MD;  Location: [facility];  Service: General;  Laterality: Left;  . WISDOM TOOTH EXTRACTION       OB History   No obstetric history on file.     Family History  Problem Relation Age of Onset  . Colon cancer Father     Social History   Tobacco Use  . Smoking status: Former Smoker    Packs/day: 1.00    Years: 10.00    Pack years: 10.00    Types: Cigarettes    Quit date: 02/16/2017    Years since quitting: 3.4  . Smokeless tobacco: Never Used  Vaping Use  . Vaping Use: Never used  Substance Use Topics  . Alcohol use: Yes    Comment: occasional  . Drug use: No    Home Medications Prior to Admission medications   Medication Sig Start Date End Date Taking? Authorizing Provider  acetaminophen (TYLENOL) 500 MG tablet Take 1,000 mg by mouth daily as needed for moderate pain or headache.    [provider]  exemestane (AROMASIN) 25 MG tablet TAKE 1 TABLET BY MOUTH IN THE MORNING AFTER BREAKFAST. 01/18/20   Derek Jack, MD  traMADol (ULTRAM) 50 MG tablet TAKE (1) TABLET BY MOUTH EVERY SIX HOURS AS NEEDED. 10/24/19   Derek Jack, MD    Allergies    Penicillins  Review of Systems   Review of Systems  Constitutional: Negative for activity change, appetite change and fever.  HENT: Negative for congestion and rhinorrhea.   Eyes: Negative for visual disturbance.  Respiratory: Negative for chest tightness and shortness of breath.   Cardiovascular: Positive for palpitations. Negative for chest pain.  Gastrointestinal: Negative for abdominal pain, nausea and vomiting.  Genitourinary: Negative for dysuria and hematuria.  Musculoskeletal: Negative for arthralgias and myalgias.  Skin: Negative for rash.  Neurological: Positive for dizziness and light-headedness. Negative for weakness and headaches.   all other systems are negative except  as noted in the HPI and PMH.    Physical Exam Updated Vital Signs BP (!) 141/120   Pulse (!) 119   Temp 99 F (37.2 C) (Oral)   Resp 14   Ht 5\' 5"  (1.651 m)   Wt 47.6 kg   SpO2 98%   BMI 17.47 kg/m   Physical Exam Vitals and nursing note reviewed.  Constitutional:      General: She is not in acute distress.    Appearance: She is well-developed and well-nourished.     Comments: Anxious appearing  HENT:     Head: Normocephalic and atraumatic.     Mouth/Throat:     Mouth: Oropharynx is clear and moist.     Pharynx: No oropharyngeal exudate.  Eyes:     Extraocular Movements: EOM normal.     Conjunctiva/sclera: Conjunctivae normal.     Pupils: Pupils are equal, round, and reactive to light.  Neck:     Comments: No meningismus. Cardiovascular:  Rate and Rhythm: Regular rhythm. Tachycardia present.     Pulses: Intact distal pulses.     Heart sounds: Normal heart sounds. No murmur heard.     Comments: Heart rate is regular but variable.  Is in the 80s and 90s at rest but elevates to the 120s when she speaks. Pulmonary:     Effort: Pulmonary effort is normal. No respiratory distress.     Breath sounds: Normal breath sounds.  Abdominal:     Palpations: Abdomen is soft.     Tenderness: There is no abdominal tenderness. There is no guarding or rebound.  Musculoskeletal:        General: No tenderness or edema. Normal range of motion.     Cervical back: Normal range of motion and neck supple.  Skin:    General: Skin is warm.     Capillary Refill: Capillary refill takes less than 2 seconds.     Findings: No rash.  Neurological:     General: No focal deficit present.     Mental Status: She is alert and oriented to person, place, and time. Mental status is at baseline.     Cranial Nerves: No cranial nerve deficit.     Motor: No abnormal muscle tone.     Coordination: Coordination normal.     Comments:  5/5 strength throughout. CN 2-12 intact.Equal grip strength.    Psychiatric:        Mood and Affect: Mood and affect normal.        Behavior: Behavior normal.     ED Results / Procedures / Treatments   Labs (all labs ordered are listed, but only abnormal results are displayed) Labs Reviewed  BASIC METABOLIC PANEL - Abnormal; Notable for the following components:      Result Value   Glucose, Bld 115 (*)    All other components within normal limits  CBC - Abnormal; Notable for the following components:   Hemoglobin 15.6 (*)    HCT 46.4 (*)    All other components within normal limits  D-DIMER, QUANTITATIVE (NOT AT [facility])  TSH  I-STAT BETA HCG BLOOD, ED ([facility], [facility], [facility] ONLY)  TROPONIN I (HIGH SENSITIVITY)  TROPONIN I (HIGH SENSITIVITY)    EKG EKG Interpretation  Date/Time:  Sunday August 12 2020 10:08:33 EST Ventricular Rate:  109 PR Interval:  172 QRS Duration: 94 QT Interval:  332 QTC Calculation: 447 R Axis:   111 Text Interpretation: Sinus tachycardia Biatrial enlargement Right axis deviation Incomplete right bundle branch block Anterior infarct , age undetermined Abnormal ECG Rate faster Confirmed by Ezequiel Essex [phone]) on 08/12/2020 3:13:31 PM   Radiology CT Angio Chest PE W and/or Wo Contrast  Result Date: 08/12/2020 CLINICAL DATA:  Tachycardia for several weeks, chest tightness, positive D dimer, history of right breast cancer EXAM: CT ANGIOGRAPHY CHEST WITH CONTRAST TECHNIQUE: Multidetector CT imaging of the chest was performed using the standard protocol during bolus administration of intravenous contrast. Multiplanar CT image reconstructions and MIPs were obtained to evaluate the vascular anatomy. CONTRAST:  7mL OMNIPAQUE IOHEXOL 350 MG/ML SOLN COMPARISON:  05/15/2017 FINDINGS: Cardiovascular: This is a technically adequate evaluation of the pulmonary vasculature. No filling defects or pulmonary emboli. The heart is unremarkable without pericardial effusion. Thoracic aorta is unremarkable. Mediastinum/Nodes: No enlarged  mediastinal, hilar, or axillary lymph nodes. Thyroid gland, trachea, and esophagus demonstrate no significant findings. Lungs/Pleura: There is subpleural consolidation at the right apex, as well as along the ventral margin of the right upper lobe, consistent  with post radiation scarring given history of right breast cancer. No acute airspace disease, effusion, or pneumothorax. 5 mm right lower lobe pulmonary nodule image 80/11 and 6 mm subpleural right lower lobe nodule image 83/11 are not significantly changed since 2018 and can be considered benign. There are no new pulmonary nodules. Central airways are patent. Upper Abdomen: Limited imaging through the upper abdomen demonstrates no acute process. Musculoskeletal: There are no acute or destructive bony lesions. Reconstructed images demonstrate no additional findings. Review of the MIP images confirms the above findings. IMPRESSION: 1. No evidence of pulmonary embolus. 2. No acute intrathoracic process. Stable subcentimeter pulmonary nodules can be considered benign given long-term stability. 3. Right mastectomy, with likely post radiation changes throughout the right upper lobe. Electronically Signed   By: Randa Ngo M.D.   On: 08/12/2020 17:23    Procedures Procedures (including critical care time)  Medications Ordered in ED Medications  sodium chloride 0.9 % bolus 1,000 mL (has no administration in time range)  sodium chloride 0.9 % bolus 1,000 mL (has no administration in time range)    ED Course  I have reviewed the triage vital signs and the nursing notes.  Pertinent labs & imaging results that were available during my care of the patient were reviewed by me and considered in my medical decision making (see chart for details).    MDM Rules/Calculators/A&P                         History of breast cancer here with palpitations for the past several weeks.  No chest pain or shortness of breath.  Patient took caffeine of her diet.  She is  regularly tachycardic and heart rate increases as she speaks.  Patient given IV fluids.  She appears anxious.  Troponins are negative and D-dimer is negative.  No suspicion for ACS or pulmonary embolism.  Thyroid function is normal.  Heart rate is improved to the 80s and 90s.  She feels better after IV fluids and is tolerating p.o. and ambulatory.  She is not orthostatic.  Will refer to cardiology for Holter monitor.  Return to the ED with exertional chest pain, shortness of breath, nausea, vomiting, sweating, other concerns. Final Clinical Impression(s) / ED Diagnoses Final diagnoses:  Palpitations    Rx / DC Orders ED Discharge Orders    None       Wednesday Ericsson, Annie Main, MD 08/12/20 2114

## 2020-08-12 NOTE — Discharge Instructions (Signed)
Keep yourself hydrated.  There is no evidence of heart attack or blood clot in the lung.  Follow-up with the cardiologist to wear a monitor at home.  Return to the ED for chest pain becomes exertional, associated shortness of breath, nausea, vomiting, sweating, any other concerns.

## 2020-08-12 NOTE — ED Notes (Signed)
Discharge instructions discussed with pt. Pt verbalized understanding. Pt stable and ambulatory. No signature pad available. 

## 2020-08-12 NOTE — ED Notes (Signed)
Pt had no complaints during orthostatics. Pt HR continued to move from 80-120 throughout vital signs.

## 2020-08-12 NOTE — ED Triage Notes (Signed)
Patient complains of 2-3 weeks of feeling as if heart racing in the mornings and as the day goes on it resolves. Reports some chest tightness with same. No respiratory illness symptoms

## 2020-08-20 ENCOUNTER — Other Ambulatory Visit (HOSPITAL_COMMUNITY): Payer: Self-pay | Admitting: Hematology

## 2020-08-20 DIAGNOSIS — Z1231 Encounter for screening mammogram for malignant neoplasm of breast: Secondary | ICD-10-CM

## 2020-08-24 ENCOUNTER — Emergency Department (HOSPITAL_COMMUNITY)
Admission: EM | Admit: 2020-08-24 | Discharge: 2020-08-24 | Disposition: A | Discharge status: Home / Self Care | Payer: Self-pay | Attending: Emergency Medicine | Admitting: Emergency Medicine

## 2020-08-24 ENCOUNTER — Other Ambulatory Visit: Payer: Self-pay

## 2020-08-24 DIAGNOSIS — R002 Palpitations: Secondary | ICD-10-CM | POA: Insufficient documentation

## 2020-08-24 DIAGNOSIS — Z853 Personal history of malignant neoplasm of breast: Secondary | ICD-10-CM | POA: Insufficient documentation

## 2020-08-24 DIAGNOSIS — R5383 Other fatigue: Secondary | ICD-10-CM | POA: Insufficient documentation

## 2020-08-24 DIAGNOSIS — Z87891 Personal history of nicotine dependence: Secondary | ICD-10-CM | POA: Insufficient documentation

## 2020-08-24 DIAGNOSIS — R300 Dysuria: Secondary | ICD-10-CM | POA: Insufficient documentation

## 2020-08-24 LAB — COMPREHENSIVE METABOLIC PANEL
ALT: 16 U/L (ref 0–44)
AST: 18 U/L (ref 15–41)
Albumin: 4.3 g/dL (ref 3.5–5.0)
Alkaline Phosphatase: 95 U/L (ref 38–126)
Anion gap: 12 (ref 5–15)
BUN: <5 mg/dL — ABNORMAL LOW (ref 6–20)
CO2: 25 mmol/L (ref 22–32)
Calcium: 9.6 mg/dL (ref 8.9–10.3)
Chloride: 102 mmol/L (ref 98–111)
Creatinine, Ser: 0.65 mg/dL (ref 0.44–1.00)
GFR, Estimated: >60 mL/min (ref >60)
Glucose, Bld: 117 mg/dL — ABNORMAL HIGH (ref 70–99)
Potassium: 3.5 mmol/L (ref 3.5–5.1)
Sodium: 139 mmol/L (ref 135–145)
Total Bilirubin: 0.7 mg/dL (ref 0.3–1.2)
Total Protein: 7.1 g/dL (ref 6.5–8.1)

## 2020-08-24 LAB — CBC WITH DIFFERENTIAL/PLATELET
Abs Immature Granulocytes: 0.02 10*3/uL (ref 0.00–0.07)
Basophils Absolute: 0 10*3/uL (ref 0.0–0.1)
Basophils Relative: 0 %
Eosinophils Absolute: 0 10*3/uL (ref 0.0–0.5)
Eosinophils Relative: 0 %
HCT: 43.9 % (ref 36.0–46.0)
Hemoglobin: 14.9 g/dL (ref 12.0–15.0)
Immature Granulocytes: 0 %
Lymphocytes Relative: 17 %
Lymphs Abs: 1.2 10*3/uL (ref 0.7–4.0)
MCH: 32.3 pg (ref 26.0–34.0)
MCHC: 33.9 g/dL (ref 30.0–36.0)
MCV: 95.2 fL (ref 80.0–100.0)
Monocytes Absolute: 0.4 10*3/uL (ref 0.1–1.0)
Monocytes Relative: 5 %
Neutro Abs: 5.3 10*3/uL (ref 1.7–7.7)
Neutrophils Relative %: 78 %
Platelets: 277 10*3/uL (ref 150–400)
RBC: 4.61 MIL/uL (ref 3.87–5.11)
RDW: 12.4 % (ref 11.5–15.5)
WBC: 6.9 10*3/uL (ref 4.0–10.5)
nRBC: 0 % (ref 0.0–0.2)

## 2020-08-24 LAB — I-STAT BETA HCG BLOOD, ED (MC, WL, AP ONLY): I-stat hCG, quantitative: <5 m[IU]/mL (ref <5)

## 2020-08-24 LAB — URINALYSIS, ROUTINE W REFLEX MICROSCOPIC
Bacteria, UA: NONE SEEN
Bilirubin Urine: NEGATIVE
Glucose, UA: NEGATIVE mg/dL
Ketones, ur: NEGATIVE mg/dL
Leukocytes,Ua: NEGATIVE
Nitrite: NEGATIVE
Protein, ur: NEGATIVE mg/dL
Specific Gravity, Urine: 1.001 — ABNORMAL LOW (ref 1.005–1.030)
pH: 7 (ref 5.0–8.0)

## 2020-08-24 LAB — TROPONIN I (HIGH SENSITIVITY): Troponin I (High Sensitivity): 4 ng/L (ref <18)

## 2020-08-24 LAB — MAGNESIUM: Magnesium: 2.1 mg/dL (ref 1.7–2.4)

## 2020-08-24 NOTE — Discharge Instructions (Addendum)
Please follow-up with your oncologist and cardiologist as planned.  Your evaluation today was reassuring overall.  Lab work and electrolytes look good.  You do have Lyme disease and [facility] spotted fever lab testing pending.  Your EKG showed some changes that improved as your heart rate improved, I discussed this with the cardiology team they felt it was less likely these changes were causing her symptoms, did not feel these were life-threatening changes.  They can further evaluate this at your upcoming appointment.  Return to the ED for any new or worse.

## 2020-08-24 NOTE — ED Provider Notes (Signed)
[facility] EMERGENCY DEPARTMENT Provider Note   CSN: [id number] Arrival date & time: 08/24/20  2585     History Chief Complaint  Patient presents with  . Fatigue    Angel Hale is a 49 y.o. female.  Angel Hale is a 49 y.o. female with a history of breast cancer, currently in remission, who presents to the emergency department for evaluation of increasing fatigue.  Patient reports she has been experiencing intermittent episodes of severe fatigue for the past 3 months.  She is also experienced intermittent palpitations, and night sweats.  She was initially evaluated for the symptoms in the ED on 12/19 and had extensive work-up including reassuring lab work, normal TSH, and CT angio of the chest which was unremarkable.  During previous work-up it was thought that anxiety may be playing a role in patient's symptoms and she was prescribed Paxil, but she has not begun taking this because she does not feel that this is a 80.  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  She was drinking a lot of coffee, but felt that this was making her palpitations worse so he stopped this.  She did not know if this could be contributing.  She reports previously she has been told she had low potassium, but during lab work on 12/19 her potassium was normal.  She also reports that she was previously on hormone modifying agents after her breast cancer diagnosis, and a few months ago stopped taking her aromatase inhibitor on her own, she has not discussed this with her oncologist, does have an upcoming appointment next month with her oncologist.  She was also referred to cardiology and has an upcoming appointment for further evaluation of palpitations.  She reports over the past few days she has been very fatigued, and last night felt so fatigued she thought she was going to die.  She denies associated chest pain or shortness  of breath has not had any syncopal episodes.  States that fatigue seems to be much better today and she feels almost back to normal.  Patient is very concerned about what could be causing the symptoms.  Has not been evaluated previously aside from ED visit on 12/19, does not have a primary care doctor that she follows with but is working on getting set up with someone through the [facility].  No fevers, has had some occasional cough is not vaccinated and does not want to be tested for COVID-19. 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  Has occasionally had some dysuria, no hematuria, flank pain, abdominal pain, nausea or vomiting.       Past Medical History:  Diagnosis Date  . Cancer [facility])    right breast cancer  . History of radiation therapy 12/03/17- 01/13/18   Right Chest wall and supraclavicular region treated to 50 Gy with 25 fx of 2 Gy followed by a boost of 10 Gy in 5 fx.   . Personal history of chemotherapy   . Personal history of radiation therapy     Patient Active Problem List   Diagnosis Date Noted  . Port-A-Cath in place 06/15/2018  . S/P bilateral salpingo-oophorectomy 03/16/2018  . Intra-abdominal adhesions   . Routine cervical smear 02/11/2018  . 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 Breast cancer metastasized to axillary lymph node, right ([place]) 10/14/2017  . Malignant neoplasm of overlapping sites of right breast in female, estrogen receptor positive ([place]) 05/25/2017  . Painful orthopaedic hardware ([place]) left ankle 05/21/2017  . Malignant neoplasm of overlapping sites of right female breast ([place]) 05/14/2017  . Breast mass, right 05/12/2017  . Closed left pilon fracture, initial encounter 02/16/2017    Past Surgical History:  Procedure Laterality Date  .  BUNIONECTOMY Left 2000  . EXTERNAL FIXATION LEG Left 02/16/2017   Procedure: EXTERNAL FIXATION LEG;  Surgeon: Mcarthur Rossetti, MD;  Location: [place];  Service: Orthopedics;  Laterality: Left;  . HARDWARE REMOVAL Left 05/21/2017   Procedure: REMOVAL OF RETAINED PROMINENT SCREW LEFT ANKLE;  Surgeon: Mcarthur Rossetti, MD;  Location: [place];  Service: Orthopedics;  Laterality: Left;  . LAPAROSCOPIC BILATERAL SALPINGO OOPHERECTOMY Bilateral 03/16/2018   Procedure: LAPAROSCOPIC BILATERAL SALPINGO OOPHORECTOMY;  Surgeon: Jonnie Kind, MD;  Location: [facility];  Service: Gynecology;  Laterality: Bilateral;  . LAPAROSCOPIC LYSIS OF ADHESIONS N/A 03/16/2018   Procedure: LAPAROSCOPIC LYSIS OF ADHESIONS;  Surgeon: Jonnie Kind, MD;  Location: [facility];  Service: Gynecology;  Laterality: N/A;  . MASTECTOMY MODIFIED RADICAL Right 10/14/2017   Procedure: MASTECTOMY MODIFIED RADICAL;  Surgeon: Virl Cagey, MD;  Location: [facility];  Service: General;  Laterality: Right;  . ORIF ANKLE FRACTURE Left 03/03/2017  . ORIF ANKLE FRACTURE Left 03/03/2017   Procedure: OPEN REDUCTION INTERNAL FIXATION (ORIF) LEFT PILON FRACTURE AND LEFT LATERAL MALLEOLUS;  Surgeon: Mcarthur Rossetti, MD;  Location: [place];  Service: Orthopedics;  Laterality: Left;  . OVARIAN CYST REMOVAL Left 1991  . PORT-A-CATH REMOVAL Left 06/18/2018   Procedure: MINOR REMOVAL PORT-A-CATH;  Surgeon: Virl Cagey, MD;  Location: [facility];  Service: General;  Laterality: Left;  . PORTACATH PLACEMENT    . PORTACATH PLACEMENT Left 05/20/2017   Procedure: INSERTION PORT-A-CATH;  Surgeon: Virl Cagey, MD;  Location: [facility];  Service: General;  Laterality: Left;  . WISDOM TOOTH EXTRACTION       OB History   No obstetric history on file.     Family History  Problem Relation Age of Onset  . Colon cancer Father     Social History   Tobacco Use  . Smoking status: Former Smoker    Packs/day: 1.00    Years: 10.00     Pack years: 10.00    Types: Cigarettes    Quit date: 02/16/2017    Years since quitting: 3.5  . Smokeless tobacco: Never Used  Vaping Use  . Vaping Use: Never used  Substance Use Topics  . Alcohol use: Yes    Comment: occasional  . Drug use: No    Home Medications Prior to Admission medications   Medication Sig Start Date End Date Taking? Authorizing Provider  acetaminophen (TYLENOL) 500 MG tablet Take 1,000 mg by mouth daily as needed for moderate pain or headache.    [provider]  exemestane (AROMASIN) 25 MG tablet TAKE 1 TABLET BY MOUTH IN THE MORNING AFTER BREAKFAST. 01/18/20   Derek Jack, MD  traMADol (ULTRAM) 50 MG tablet TAKE (1) TABLET BY MOUTH EVERY SIX HOURS AS NEEDED. 10/24/19   Derek Jack, MD    Allergies    Penicillins  Review of Systems   Review of Systems  Constitutional: Positive for activity change and fatigue. Negative for chills and fever.  HENT: Negative  for congestion, rhinorrhea and sore throat.   Respiratory: Negative for shortness of breath.   Cardiovascular: Positive for palpitations. Negative for chest pain and leg swelling.  Gastrointestinal: Negative for abdominal pain and nausea.  Genitourinary: Positive for dysuria. Negative for flank pain, frequency and hematuria.  Musculoskeletal: Negative for arthralgias and myalgias.  Skin: Negative for color change and rash.  Neurological: Positive for weakness (Generalized). Negative for dizziness, syncope and light-headedness.    Physical Exam Updated Vital Signs BP 100/89 (BP Location: Left Arm)   Pulse (!) 104   Temp 98.6 F (37 C) (Oral)   Resp 12   Ht 5\' 5"  (1.651 m)   Wt 49.9 kg   SpO2 97%   BMI 18.30 kg/m   Physical Exam Vitals and nursing note reviewed.  Constitutional:      General: She is not in acute distress.    Appearance: Normal appearance. She is well-developed and well-nourished. She is not ill-appearing or diaphoretic.     Comments: Patient appears  anxious, but overall well-appearing and in no acute distress  HENT:     Head: Normocephalic and atraumatic.     Mouth/Throat:     Mouth: Oropharynx is clear and moist. Mucous membranes are moist.     Pharynx: Oropharynx is clear.  Eyes:     General:        Right eye: No discharge.        Left eye: No discharge.     Extraocular Movements: Extraocular movements intact and EOM normal.     Pupils: Pupils are equal, round, and reactive to light.  Cardiovascular:     Rate and Rhythm: Normal rate and regular rhythm.     Pulses: Normal pulses and intact distal pulses.     Heart sounds: Normal heart sounds. No murmur heard. No friction rub. No gallop.   Pulmonary:     Effort: Pulmonary effort is normal. No respiratory distress.     Breath sounds: Normal breath sounds. No wheezing or rales.     Comments: Respirations equal and unlabored, patient able to speak in full sentences, lungs clear to auscultation bilaterally  Abdominal:     General: Bowel sounds are normal. There is no distension.     Palpations: Abdomen is soft. There is no mass.     Tenderness: There is no abdominal tenderness. There is no guarding.     Comments: Abdomen soft, nondistended, nontender to palpation in all quadrants without guarding or peritoneal signs   Musculoskeletal:        General: No deformity or edema.     Cervical back: Neck supple.  Skin:    General: Skin is warm and dry.     Capillary Refill: Capillary refill takes less than 2 seconds.  Neurological:     Mental Status: She is alert.     Coordination: Coordination normal.     Comments: Speech is clear, able to follow commands CN III-XII intact Normal strength in upper and lower extremities bilaterally including dorsiflexion and plantar flexion, strong and equal grip strength Sensation normal to light and sharp touch Moves extremities without ataxia, coordination intact   Psychiatric:        Mood and Affect: Mood is anxious.        Behavior: Behavior  normal.     ED Results / Procedures / Treatments   Labs (all labs ordered are listed, but only abnormal results are displayed) Labs Reviewed  URINALYSIS, ROUTINE W REFLEX MICROSCOPIC - Abnormal; Notable for the  following components:      Result Value   Color, Urine COLORLESS (*)    Specific Gravity, Urine 1.001 (*)    Hgb urine dipstick SMALL (*)    All other components within normal limits  COMPREHENSIVE METABOLIC PANEL - Abnormal; Notable for the following components:   Glucose, Bld 117 (*)    BUN <5 (*)    All other components within normal limits  CBC WITH DIFFERENTIAL/PLATELET  ROCKY MTN SPOTTED FVR ABS PNL(IGG+IGM)  B. BURGDORFI ANTIBODIES  MAGNESIUM  I-STAT BETA HCG BLOOD, ED ([facility], [facility], [facility] ONLY)  TROPONIN I (HIGH SENSITIVITY)    EKG EKG Interpretation  Date/Time:  Friday August 24 2020 14:57:59 EST Ventricular Rate:  122 PR Interval:    QRS Duration: 90 QT Interval:  410 QTC Calculation: 584 R Axis:   104 Text Interpretation: Long QTc Accelerated Junctional rhythm with retrograde conduction Rightward axis RSR' or QR pattern in V1 suggests right ventricular conduction delay Abnormal ECG rate increased , retrograde conduction new from previous. Confirmed by Charlesetta Shanks [phone]) on 08/24/2020 3:44:07 PM   Radiology None   Procedures Procedures (including critical care time)  Medications Ordered in ED Medications - No data to display  ED Course  I have reviewed the triage vital signs and the nursing notes.  Pertinent labs & imaging results that were available during my care of the patient were reviewed by me and considered in my medical decision making (see chart for details).    MDM Rules/Calculators/A&P                         49 year old female presents with episodes of fatigue occurring over the last few months, worsening recently, she is also had some intermittent palpitations, was evaluated for the same symptoms on 12/19 with reassuring lab work  and normal CTA of the chest.  Returns today due to worsening fatigue.  Patient is very concerned about what could be causing this, it was suggested during last ED evaluation that anxiety could be contributing and patient was prescribed Paxil but has not begun taking this.  She also stopped her aromatase inhibitor recently and wonders if that could be contributing.  Given patient's extensive work-up on the 19th will check basic labs and EKG.  Patient also expresses concern about tickborne illness, she has not seen any ticks on herself and I do not see any on exam today, will send Lyme and RMSF testing but discussed with patient that this takes time to come back and will need to be followed up with her regular doctor.  Patient's lab work is overall reassuring.  CBC with no leukocytosis and normal hemoglobin.  No electrolyte derangements to explain patient's fatigue and palpitations.  Urinalysis without hematuria or signs of infection.  Lyme testing is pending.  Patient is EKG does show sinus tachycardia with QT prolongation, but patient is not on any medications that would cause QT prolongation, and her potassium and magnesium are within normal limits today.  On second EKG as rate slows QT seems to normalize.  I called and spoke with cardiology regarding these EKG changes.  They were reviewed both EKGs from today as well as previous EKGs from the 19th.  They feel that these EKG changes are rate mediated and not actually prolonged QT, do not feel that this is life-threatening or requiring further emergent work-up.  Patient has scheduled cardiology follow-up in the next 2 weeks which I feel is appropriate.  Her  palpitations have resolved here in the ED and heart rate has returned to normal, troponins were negative as well which is reassuring.  I had long discussion with patient and her husband regarding the symptoms, stressed the importance of outpatient follow-up as planned.  At this time there does not appear to  be any evidence of an acute emergency medical condition and the patient appears stable for discharge with appropriate outpatient follow up. Diagnosis was discussed with patient who verbalizes understanding and is agreeable to discharge. Pt case discussed with Dr. Johnney Killian who agrees with my plan.    Final Clinical Impression(s) / ED Diagnoses Final diagnoses:  Fatigue, unspecified type  Palpitations    Rx / DC Orders ED Discharge Orders    None       Janet Berlin 08/28/20 1215    Charlesetta Shanks, MD 08/28/20 [phone]

## 2020-08-24 NOTE — ED Notes (Signed)
Reviewed discharge instructions with patient and spouse. Follow-up care reviewed. Patient and spouse verbalized understanding. Patient A&Ox4, VSS, and ambulatory with steady gait upon discharge. 

## 2020-08-24 NOTE — ED Triage Notes (Signed)
Pt here with increase fatigue since she was last seen in ER on the 12/19. Pt also co some burning when she urinates. Pt also states since she quit drinking caffeine her HR has gotten better.

## 2020-08-24 NOTE — ED Notes (Signed)
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 Pt states night sweats and chills, fatigue that eases as the day progresses. Pt denies any productive cough.

## 2020-08-24 NOTE — ED Notes (Signed)
Pt has multiple complaints, fatigue, burning with urination, concerned her potassium is low, night sweats, palpitations. Pt was given paxil prescription when she was seen last and was diagnosed with anxiety but pt reports she hasn't taken the medication and doesn't think it is anxiety because she has never experienced anxiety like this. Pt reports she thought she was going to die last night and stated, " I even said receive me Jesus if it is my time". Pt is hyperverbal and appears anxious upon assessment.

## 2020-08-27 ENCOUNTER — Ambulatory Visit (HOSPITAL_COMMUNITY)
Admission: RE | Admit: 2020-08-27 | Discharge: 2020-08-27 | Disposition: A | Discharge status: Home / Self Care | Payer: Self-pay | Source: Ambulatory Visit | Attending: Hematology | Admitting: Hematology

## 2020-08-27 ENCOUNTER — Other Ambulatory Visit: Payer: Self-pay

## 2020-08-27 ENCOUNTER — Other Ambulatory Visit (HOSPITAL_COMMUNITY): Payer: Self-pay | Admitting: Hematology

## 2020-08-27 DIAGNOSIS — Z1231 Encounter for screening mammogram for malignant neoplasm of breast: Secondary | ICD-10-CM

## 2020-08-27 LAB — B. BURGDORFI ANTIBODIES: B burgdorferi Ab IgG+IgM: <0.91 {ISR} (ref 0.00–0.90)

## 2020-08-28 LAB — ROCKY MTN SPOTTED FVR ABS PNL(IGG+IGM)
RMSF IgG: NEGATIVE
RMSF IgM: 0.29 index (ref 0.00–0.89)

## 2020-08-28 NOTE — Progress Notes (Signed)
Cardiology Office Note:    Date:  09/06/2020   ID:  Angel Hale, DOB [birth year], MRN [id number]  PCP:  Pcp, No  [facility] Cardiologist:  No primary care provider on file.  [facility] Electrophysiologist:  None   Referring MD: Ezequiel Essex, MD    History of Present Illness:    Angel Hale is a 49 y.o. female with a hx of breast cancer s/p chemo and XRT in remission who was referred by Dr. Wyvonnia Dusky for further evaluation of palpitations.  Patient was seen in [facility] ED on 12/31 for worsening fatigue and intermittent palpitations. Had thorough work-up on previous ED visit on 12/19 where labs were normal including TSH and CT angio of the chest was without PE.   Today, the patient states that she has been having strong heart beats. This began after she has COVID exposure in November. States her resting HR usually was in the 60s and it was running in the 80s. She had associated weakness, malaise, and flu like symptoms. She thinks her symptoms may be related to prior COVID infection with residual effects.  Since her ED visit, she continues to have some strong "thudding" in her chest. Symptoms are mainly occurring in the morning with associated significant fatigue. Usually she is very active but she feels much more wiped out recently. No known history of cardiac disease or arrhythmias.  TTE in 2019 with normal BiV function, no significant valvular abnormalities.  Past Medical History:  Diagnosis Date  . Cancer [facility])    right breast cancer  . History of radiation therapy 12/03/17- 01/13/18   Right Chest wall and supraclavicular region treated to 50 Gy with 25 fx of 2 Gy followed by a boost of 10 Gy in 5 fx.   . Personal history of chemotherapy   . Personal history of radiation therapy     Past Surgical History:  Procedure Laterality Date  . BUNIONECTOMY Left 2000  . EXTERNAL FIXATION LEG Left 02/16/2017   Procedure: EXTERNAL FIXATION LEG;  Surgeon: Mcarthur Rossetti,  MD;  Location: [place];  Service: Orthopedics;  Laterality: Left;  . HARDWARE REMOVAL Left 05/21/2017   Procedure: REMOVAL OF RETAINED PROMINENT SCREW LEFT ANKLE;  Surgeon: Mcarthur Rossetti, MD;  Location: [place];  Service: Orthopedics;  Laterality: Left;  . LAPAROSCOPIC BILATERAL SALPINGO OOPHERECTOMY Bilateral 03/16/2018   Procedure: LAPAROSCOPIC BILATERAL SALPINGO OOPHORECTOMY;  Surgeon: Jonnie Kind, MD;  Location: [facility];  Service: Gynecology;  Laterality: Bilateral;  . LAPAROSCOPIC LYSIS OF ADHESIONS N/A 03/16/2018   Procedure: LAPAROSCOPIC LYSIS OF ADHESIONS;  Surgeon: Jonnie Kind, MD;  Location: [facility];  Service: Gynecology;  Laterality: N/A;  . MASTECTOMY MODIFIED RADICAL Right 10/14/2017   Procedure: MASTECTOMY MODIFIED RADICAL;  Surgeon: Virl Cagey, MD;  Location: [facility];  Service: General;  Laterality: Right;  . ORIF ANKLE FRACTURE Left 03/03/2017  . ORIF ANKLE FRACTURE Left 03/03/2017   Procedure: OPEN REDUCTION INTERNAL FIXATION (ORIF) LEFT PILON FRACTURE AND LEFT LATERAL MALLEOLUS;  Surgeon: Mcarthur Rossetti, MD;  Location: [place];  Service: Orthopedics;  Laterality: Left;  . OVARIAN CYST REMOVAL Left 1991  . PORT-A-CATH REMOVAL Left 06/18/2018   Procedure: MINOR REMOVAL PORT-A-CATH;  Surgeon: Virl Cagey, MD;  Location: [facility];  Service: General;  Laterality: Left;  . PORTACATH PLACEMENT    . PORTACATH PLACEMENT Left 05/20/2017   Procedure: INSERTION PORT-A-CATH;  Surgeon: Virl Cagey, MD;  Location: [facility];  Service: General;  Laterality: Left;  .  WISDOM TOOTH EXTRACTION      Current Medications: No outpatient medications have been marked as taking for the 09/06/20 encounter (Office Visit) with Freada Bergeron, MD.     Allergies:   Penicillins   Social History   Socioeconomic History  . Marital status: Married    Spouse name: Not on file  . Number of children: Not on file  . Years of education: Not on file  . Highest education  level: Not on file  Occupational History  . Not on file  Tobacco Use  . Smoking status: Former Smoker    Packs/day: 1.00    Years: 10.00    Pack years: 10.00    Types: Cigarettes    Quit date: 02/16/2017    Years since quitting: 3.5  . Smokeless tobacco: Never Used  Vaping Use  . Vaping Use: Never used  Substance and Sexual Activity  . Alcohol use: Yes    Comment: occasional  . Drug use: No  . Sexual activity: Yes    Birth control/protection: None  Other Topics Concern  . Not on file  Social History Narrative  . Not on file   Social Determinants of Health   Financial Resource Strain: Not on file  Food Insecurity: Not on file  Transportation Needs: Not on file  Physical Activity: Not on file  Stress: Not on file  Social Connections: Not on file     Family History: The patient's family history includes Colon cancer in her father.  ROS:   Please see the history of present illness.    Review of Systems  Constitutional: Positive for malaise/fatigue. Negative for chills and fever.  HENT: Negative for congestion.   Eyes: Negative for pain.  Respiratory: Negative for shortness of breath.   Cardiovascular: Positive for palpitations. Negative for chest pain, orthopnea, claudication, leg swelling and PND.  Gastrointestinal: Negative for nausea and vomiting.  Genitourinary: Negative for frequency.  Musculoskeletal: Positive for myalgias.  Neurological: Negative for dizziness and loss of consciousness.  Endo/Heme/Allergies: Negative for polydipsia.  Psychiatric/Behavioral: Negative for substance abuse.    EKGs/Labs/Other Studies Reviewed:    The following studies were reviewed today: TTE 12-16-2017: Study Conclusions   - Left ventricle: The cavity size was normal. Wall thickness was  normal. Systolic function was normal. The estimated ejection  fraction was in the range of 60% to 65%. Normal global  longitudinal strain of -20%. Wall motion was normal; there were   no regional wall motion abnormalities. Left ventricular diastolic  function parameters were normal.  - Systemic veins: The IVC is dilated with normal respiratory  variation. Estimated CVP 8 mmHg.  Left ventricle: The cavity size was normal. Wall thickness was  normal. Systolic function was normal. The estimated ejection  fraction was in the range of 60% to 65%. Normal global longitudinal  strain of -20%. Wall motion was normal; there were no regional wall  motion abnormalities. The transmitral flow pattern was normal. The  deceleration time of the early transmitral flow velocity was  normal. The pulmonary vein flow pattern was normal. The tissue  Doppler parameters were normal. Left ventricular diastolic function  parameters were normal.   -------------------------------------------------------------------  Aortic valve:  Trileaflet. Doppler:  There was no stenosis.  There was no regurgitation.   -------------------------------------------------------------------  Aorta: Aortic root: The aortic root was normal in size.   -------------------------------------------------------------------  Mitral valve:  Structurally normal valve.  Leaflet separation was  normal. Doppler: Transvalvular velocity was within the normal  range. There  was no evidence for stenosis. There was no  regurgitation.  Peak gradient (D): 3 mm Hg.   -------------------------------------------------------------------  Left atrium: The atrium was normal in size.   -------------------------------------------------------------------  Atrial septum: No defect or patent foramen ovale was identified.    -------------------------------------------------------------------  Right ventricle: The cavity size was normal. Wall thickness was  normal. Systolic function was normal.   -------------------------------------------------------------------  Pulmonic valve:  Poorly visualized. Doppler: There  was no  significant regurgitation.   -------------------------------------------------------------------  Tricuspid valve:  Structurally normal valve.  Doppler: There  was trivial regurgitation.   -------------------------------------------------------------------  Right atrium: The atrium was normal in size.   -------------------------------------------------------------------  Pericardium: There was no pericardial effusion.   -------------------------------------------------------------------  Systemic veins: The IVC is dilated with normal respiratory  variation. Estimated CVP 8 mmHg.   CTA 08/12/20: FINDINGS: Cardiovascular: This is a technically adequate evaluation of the pulmonary vasculature. No filling defects or pulmonary emboli.  The heart is unremarkable without pericardial effusion. Thoracic aorta is unremarkable.  Mediastinum/Nodes: No enlarged mediastinal, hilar, or axillary lymph nodes. Thyroid gland, trachea, and esophagus demonstrate no significant findings.  Lungs/Pleura: There is subpleural consolidation at the right apex, as well as along the ventral margin of the right upper lobe, consistent with post radiation scarring given history of right breast cancer. No acute airspace disease, effusion, or pneumothorax.  5 mm right lower lobe pulmonary nodule image 80/11 and 6 mm subpleural right lower lobe nodule image 83/11 are not significantly changed since 2018 and can be considered benign. There are no new pulmonary nodules. Central airways are patent.  Upper Abdomen: Limited imaging through the upper abdomen demonstrates no acute process.  Musculoskeletal: There are no acute or destructive bony lesions. Reconstructed images demonstrate no additional findings.  Review of the MIP images confirms the above findings.  IMPRESSION: 1. No evidence of pulmonary embolus. 2. No acute intrathoracic process. Stable subcentimeter pulmonary nodules  can be considered benign given long-term stability. 3. Right mastectomy, with likely post radiation changes throughout the right upper lobe.  EKG:  EKG 08/27/20 with accelerated junctional rhythm with retrograde conduction and incomplete RBBB  Recent Labs: 08/12/2020: TSH 1.321 08/24/2020: ALT 16; BUN <5; Creatinine, Ser 0.65; Hemoglobin 14.9; Magnesium 2.1; Platelets 277; Potassium 3.5; Sodium 139  Recent Lipid Panel No results found for: CHOL, TRIG, HDL, CHOLHDL, VLDL, LDLCALC, LDLDIRECT   Physical Exam:    VS:  BP (!) 102/52   Pulse 96   Ht 5\' 5"  (1.651 m)   Wt 119 lb 3.2 oz (54.1 kg)   SpO2 98%   BMI 19.84 kg/m     Wt Readings from Last 3 Encounters:  09/06/20 119 lb 3.2 oz (54.1 kg)  08/24/20 110 lb (49.9 kg)  08/12/20 105 lb (47.6 kg)     GEN:  Well nourished, well developed in no acute distress HEENT: Normal NECK: No JVD; No carotid bruits CARDIAC: RRR, no murmurs, rubs, gallops RESPIRATORY:  Clear to auscultation without rales, wheezing or rhonchi  ABDOMEN: Soft, non-tender, non-distended MUSCULOSKELETAL:  No edema; No deformity  SKIN: Warm and dry NEUROLOGIC:  Alert and oriented x 3 PSYCHIATRIC:  Normal affect   ASSESSMENT:    1. Palpitations    PLAN:    In order of problems listed above:  #Palpitations: #Accelerated Junctional Rhythm on ECG: Patient with history of palpitations after suspected covid infection with associated fatigue. ECG in the ER with accelerated junctional rhythm which may be the source of her pounding heart. Symptoms overall improving  and HR mainly in the 80s at home. No known CAD and TTEs with normal BiV function, normal strain. No ischemic symptoms and patient is active at baseline. -Check zio monitor  -Consider starting beta blocker pending above findings -TTE in 2019 with normal BiV function, normal strain, no significant valve abnormalities     Medication Adjustments/Labs and Tests Ordered: Current medicines are reviewed at  length with the patient today.  Concerns regarding medicines are outlined above.  Orders Placed This Encounter  Procedures  . LONG TERM MONITOR (3-14 DAYS)   No orders of the defined types were placed in this encounter.   Patient Instructions  Medication Instructions:  Your physician recommends that you continue on your current medications as directed. Please refer to the Current Medication list given to you today.  *If you need a refill on your cardiac medications before your next appointment, please call your pharmacy*   Lab Work: None If you have labs (blood work) drawn today and your tests are completely normal, you will receive your results only by: Marland Kitchen MyChart Message (if you have MyChart) OR . A paper copy in the mail If you have any lab test that is abnormal or we need to change your treatment, we will call you to review the results.   Testing/Procedures: Your physician recommends that you wear a monitor for 7 days.   Follow-Up: At [facility], you and your health needs are our priority.  As part of our continuing mission to provide you with exceptional heart care, we have created designated Provider Care Teams.  These Care Teams include your primary Cardiologist (physician) and Advanced Practice Providers (APPs -  Physician Assistants and Nurse Practitioners) who all work together to provide you with the care you need, when you need it.  We recommend signing up for the patient portal called "MyChart".  Sign up information is provided on this After Visit Summary.  MyChart is used to connect with patients for Virtual Visits (Telemedicine).  Patients are able to view lab/test results, encounter notes, upcoming appointments, etc.  Non-urgent messages can be sent to your provider as well.   To learn more about what you can do with MyChart, go to [email or web address].    Your next appointment:   As needed  The format for your next appointment:   In Person  Provider:    You may see Laurance Flatten, MD or one of the following Advanced Practice Providers on your designated Care Team:    Tereso Newcomer, PA-C  Vin Bhagat, [place]    Other Instructions  ZIO XT- Long Term Monitor Instructions   Your physician has requested you wear your ZIO patch monitor 7 days.   This is a single patch monitor.  Ir[employer] supplies one patch monitor per enrollment.  Additional stickers are not available.   Please do not apply patch if you will be having a Nuclear Stress Test, Echocardiogram, Cardiac CT, MRI, or Chest Xray during the time frame you would be wearing the monitor. The patch cannot be worn during these tests.  You cannot remove and re-apply the ZIO XT patch monitor.   Your ZIO patch monitor will be sent USPS Priority mail from [facility] directly to your home address. The monitor may also be mailed to a PO BOX if home delivery is not available.   It may take 3-5 days to receive your monitor after you have been enrolled.   Once you have received you monitor, please review enclosed instructions.  Your  monitor has already been registered assigning a specific monitor serial # to you.   Applying the monitor   Shave hair from upper left chest.   Hold abrader disc by orange tab.  Rub abrader in 40 strokes over left upper chest as indicated in your monitor instructions.   Clean area with 4 enclosed alcohol pads .  Use all pads to assure are is cleaned thoroughly.  Let dry.   Apply patch as indicated in monitor instructions.  Patch will be place under collarbone on left side of chest with arrow pointing upward.   Rub patch adhesive wings for 2 minutes.Remove white label marked "1".  Remove white label marked "2".  Rub patch adhesive wings for 2 additional minutes.   While looking in a mirror, press and release button in center of patch.  A small green light will flash 3-4 times .  This will be your only indicator the monitor has been turned on.     Do not  shower for the first 24 hours.  You may shower after the first 24 hours.   Press button if you feel a symptom. You will hear a small click.  Record Date, Time and Symptom in the Patient Log Book.   When you are ready to remove patch, follow instructions on last 2 pages of Patient Log Book.  Stick patch monitor onto last page of Patient Log Book.   Place Patient Log Book in [place] box.  Use locking tab on box and tape box closed securely.  The [employer] and [employer] has [employer] on it.  Please place in mailbox as soon as possible.  Your physician should have your test results approximately 7 days after the monitor has been mailed back to [facility].   Call [place] at [phone] if you have questions regarding your ZIO XT patch monitor.  Call them immediately if you see an orange light blinking on your monitor.   If your monitor falls off in less than 4 days contact our Monitor department at [phone].  If your monitor becomes loose or falls off after 4 days call [employer] at [phone] for suggestions on securing your monitor.       Signed, Freada Bergeron, MD  09/06/2020 4:52 PM    [place]

## 2020-09-06 ENCOUNTER — Ambulatory Visit (INDEPENDENT_AMBULATORY_CARE_PROVIDER_SITE_OTHER): Payer: Self-pay

## 2020-09-06 ENCOUNTER — Encounter: Payer: Self-pay | Admitting: Cardiology

## 2020-09-06 ENCOUNTER — Ambulatory Visit (INDEPENDENT_AMBULATORY_CARE_PROVIDER_SITE_OTHER): Payer: Self-pay | Admitting: Cardiology

## 2020-09-06 ENCOUNTER — Other Ambulatory Visit: Payer: Self-pay

## 2020-09-06 ENCOUNTER — Encounter: Payer: Self-pay | Admitting: Radiology

## 2020-09-06 VITALS — BP 102/52 | HR 96 | Ht 65.0 in | Wt 119.2 lb

## 2020-09-06 DIAGNOSIS — R002 Palpitations: Secondary | ICD-10-CM

## 2020-09-06 NOTE — Patient Instructions (Signed)
Medication Instructions:  Your physician recommends that you continue on your current medications as directed. Please refer to the Current Medication list given to you today.  *If you need a refill on your cardiac medications before your next appointment, please call your pharmacy*   Lab Work: None If you have labs (blood work) drawn today and your tests are completely normal, you will receive your results only by: Marland Kitchen MyChart Message (if you have MyChart) OR . A paper copy in the mail If you have any lab test that is abnormal or we need to change your treatment, we will call you to review the results.   Testing/Procedures: Your physician recommends that you wear a monitor for 7 days.   Follow-Up: At [facility], you and your health needs are our priority.  As part of our continuing mission to provide you with exceptional heart care, we have created designated Provider Care Teams.  These Care Teams include your primary Cardiologist (physician) and Advanced Practice Providers (APPs -  Physician Assistants and Nurse Practitioners) who all work together to provide you with the care you need, when you need it.  We recommend signing up for the patient portal called "MyChart".  Sign up information is provided on this After Visit Summary.  MyChart is used to connect with patients for Virtual Visits (Telemedicine).  Patients are able to view lab/test results, encounter notes, upcoming appointments, etc.  Non-urgent messages can be sent to your provider as well.   To learn more about what you can do with MyChart, go to [email or web address].    Your next appointment:   As needed  The format for your next appointment:   In Person  Provider:   You may see Gwyndolyn Kaufman, MD or one of the following Advanced Practice Providers on your designated Care Team:    Richardson Dopp, PA-C  Vin Bhagat, [place]    Other Instructions  [place] Monitor Instructions   Your physician has  requested you wear your ZIO patch monitor 7 days.   This is a single patch monitor.  Irhythm supplies one patch monitor per enrollment.  Additional stickers are not available.   Please do not apply patch if you will be having a Nuclear Stress Test, Echocardiogram, Cardiac CT, MRI, or Chest Xray during the time frame you would be wearing the monitor. The patch cannot be worn during these tests.  You cannot remove and re-apply the ZIO XT patch monitor.   Your ZIO patch monitor will be sent USPS Priority mail from [facility] directly to your home address. The monitor may also be mailed to a PO BOX if home delivery is not available.   It may take 3-5 days to receive your monitor after you have been enrolled.   Once you have received you monitor, please review enclosed instructions.  Your monitor has already been registered assigning a specific monitor serial # to you.   Applying the monitor   Shave hair from upper left chest.   Hold abrader disc by orange tab.  Rub abrader in 40 strokes over left upper chest as indicated in your monitor instructions.   Clean area with 4 enclosed alcohol pads .  Use all pads to assure are is cleaned thoroughly.  Let dry.   Apply patch as indicated in monitor instructions.  Patch will be place under collarbone on left side of chest with arrow pointing upward.   Rub patch adhesive wings for 2 minutes.Remove white label marked "1".  Remove white label marked "2".  Rub patch adhesive wings for 2 additional minutes.   While looking in a mirror, press and release button in center of patch.  A small green light will flash 3-4 times .  This will be your only indicator the monitor has been turned on.     Do not shower for the first 24 hours.  You may shower after the first 24 hours.   Press button if you feel a symptom. You will hear a small click.  Record Date, Time and Symptom in the Patient Log Book.   When you are ready to remove patch, follow  instructions on last 2 pages of Patient Log Book.  Stick patch monitor onto last page of Patient Log Book.   Place Patient Log Book in [place] box.  Use locking tab on box and tape box closed securely.  The [employer] and [employer] has [employer] on it.  Please place in mailbox as soon as possible.  Your physician should have your test results approximately 7 days after the monitor has been mailed back to [facility].   Call [place] at [phone] if you have questions regarding your ZIO XT patch monitor.  Call them immediately if you see an orange light blinking on your monitor.   If your monitor falls off in less than 4 days contact our Monitor department at [phone].  If your monitor becomes loose or falls off after 4 days call [employer] at [phone] for suggestions on securing your monitor.

## 2020-09-06 NOTE — Progress Notes (Signed)
Enrolled patient for a 7 day Zio XT Monitor to be mailed to patients home. Patient was told to call Z[employer] to get selfpay/hardship quote

## 2020-09-16 DIAGNOSIS — R002 Palpitations: Secondary | ICD-10-CM

## 2020-09-18 ENCOUNTER — Other Ambulatory Visit (HOSPITAL_COMMUNITY): Payer: Self-pay | Admitting: *Deleted

## 2020-09-18 DIAGNOSIS — C50911 Malignant neoplasm of unspecified site of right female breast: Secondary | ICD-10-CM

## 2020-09-18 DIAGNOSIS — C773 Secondary and unspecified malignant neoplasm of axilla and upper limb lymph nodes: Secondary | ICD-10-CM

## 2020-09-19 ENCOUNTER — Inpatient Hospital Stay (HOSPITAL_COMMUNITY): Payer: Self-pay | Attending: Hematology

## 2020-09-19 ENCOUNTER — Other Ambulatory Visit: Payer: Self-pay

## 2020-09-19 DIAGNOSIS — C50811 Malignant neoplasm of overlapping sites of right female breast: Secondary | ICD-10-CM | POA: Insufficient documentation

## 2020-09-19 DIAGNOSIS — C50911 Malignant neoplasm of unspecified site of right female breast: Secondary | ICD-10-CM

## 2020-09-19 DIAGNOSIS — Z79811 Long term (current) use of aromatase inhibitors: Secondary | ICD-10-CM | POA: Insufficient documentation

## 2020-09-19 DIAGNOSIS — Z170 Estrogen receptor positive status [ER+]: Secondary | ICD-10-CM | POA: Insufficient documentation

## 2020-09-19 DIAGNOSIS — Z79899 Other long term (current) drug therapy: Secondary | ICD-10-CM | POA: Insufficient documentation

## 2020-09-19 DIAGNOSIS — Z9011 Acquired absence of right breast and nipple: Secondary | ICD-10-CM | POA: Insufficient documentation

## 2020-09-19 LAB — COMPREHENSIVE METABOLIC PANEL
ALT: 35 U/L (ref 0–44)
AST: 27 U/L (ref 15–41)
Albumin: 4.5 g/dL (ref 3.5–5.0)
Alkaline Phosphatase: 68 U/L (ref 38–126)
Anion gap: 8 (ref 5–15)
BUN: 10 mg/dL (ref 6–20)
CO2: 29 mmol/L (ref 22–32)
Calcium: 9.7 mg/dL (ref 8.9–10.3)
Chloride: 101 mmol/L (ref 98–111)
Creatinine, Ser: 0.68 mg/dL (ref 0.44–1.00)
GFR, Estimated: >60 mL/min (ref >60)
Glucose, Bld: 92 mg/dL (ref 70–99)
Potassium: 3.8 mmol/L (ref 3.5–5.1)
Sodium: 138 mmol/L (ref 135–145)
Total Bilirubin: 0.6 mg/dL (ref 0.3–1.2)
Total Protein: 7.5 g/dL (ref 6.5–8.1)

## 2020-09-19 LAB — CBC WITH DIFFERENTIAL/PLATELET
Abs Immature Granulocytes: 0.01 10*3/uL (ref 0.00–0.07)
Basophils Absolute: 0 10*3/uL (ref 0.0–0.1)
Basophils Relative: 1 %
Eosinophils Absolute: 0.1 10*3/uL (ref 0.0–0.5)
Eosinophils Relative: 1 %
HCT: 44.5 % (ref 36.0–46.0)
Hemoglobin: 14.7 g/dL (ref 12.0–15.0)
Immature Granulocytes: 0 %
Lymphocytes Relative: 25 %
Lymphs Abs: 1.3 10*3/uL (ref 0.7–4.0)
MCH: 32.9 pg (ref 26.0–34.0)
MCHC: 33 g/dL (ref 30.0–36.0)
MCV: 99.6 fL (ref 80.0–100.0)
Monocytes Absolute: 0.3 10*3/uL (ref 0.1–1.0)
Monocytes Relative: 6 %
Neutro Abs: 3.7 10*3/uL (ref 1.7–7.7)
Neutrophils Relative %: 67 %
Platelets: 274 10*3/uL (ref 150–400)
RBC: 4.47 MIL/uL (ref 3.87–5.11)
RDW: 12.8 % (ref 11.5–15.5)
WBC: 5.4 10*3/uL (ref 4.0–10.5)
nRBC: 0 % (ref 0.0–0.2)

## 2020-09-19 LAB — VITAMIN D 25 HYDROXY (VIT D DEFICIENCY, FRACTURES): Vit D, 25-Hydroxy: 45.34 ng/mL (ref 30–100)

## 2020-09-26 ENCOUNTER — Other Ambulatory Visit: Payer: Self-pay

## 2020-09-26 ENCOUNTER — Other Ambulatory Visit (HOSPITAL_COMMUNITY): Payer: Self-pay | Admitting: *Deleted

## 2020-09-26 ENCOUNTER — Inpatient Hospital Stay (HOSPITAL_COMMUNITY): Payer: Self-pay | Attending: Hematology | Admitting: Hematology

## 2020-09-26 VITALS — BP 128/74 | HR 79 | Temp 97.3°F | Resp 18 | Wt 121.4 lb

## 2020-09-26 DIAGNOSIS — Z170 Estrogen receptor positive status [ER+]: Secondary | ICD-10-CM

## 2020-09-26 DIAGNOSIS — C50811 Malignant neoplasm of overlapping sites of right female breast: Secondary | ICD-10-CM

## 2020-09-26 DIAGNOSIS — C773 Secondary and unspecified malignant neoplasm of axilla and upper limb lymph nodes: Secondary | ICD-10-CM | POA: Insufficient documentation

## 2020-09-26 DIAGNOSIS — Z923 Personal history of irradiation: Secondary | ICD-10-CM | POA: Insufficient documentation

## 2020-09-26 DIAGNOSIS — Z9011 Acquired absence of right breast and nipple: Secondary | ICD-10-CM | POA: Insufficient documentation

## 2020-09-26 DIAGNOSIS — Z79811 Long term (current) use of aromatase inhibitors: Secondary | ICD-10-CM | POA: Insufficient documentation

## 2020-09-26 DIAGNOSIS — M25572 Pain in left ankle and joints of left foot: Secondary | ICD-10-CM | POA: Insufficient documentation

## 2020-09-26 NOTE — Patient Instructions (Addendum)
[facility] Discharge Instructions  You were seen today by Dr. Katragadda. He went over your recent results. Dr. Katragadda will see you back in 3 months for labs and follow up.   Thank you for choosing [facility] to provide your oncology and hematology care.  To afford each patient quality time with our provider, please arrive at least 15 minutes before your scheduled appointment time.   If you have a lab appointment with the Cancer Center please come in thru the Main Entrance and check in at the main information desk  You need to re-schedule your appointment should you arrive 10 or more minutes late.  We strive to give you quality time with our providers, and arriving late affects you and other patients whose appointments are after yours.  Also, if you no show three or more times for appointments you may be dismissed from the clinic at the providers discretion.     Again, thank you for choosing [facility].  Our hope is that these requests will decrease the amount of time that you wait before being seen by our physicians.       _____________________________________________________________  Should you have questions after your visit to [facility], please contact our office at [phone] between the hours of 8:00 a.m. and 4:30 p.m.  Voicemails left after 4:00 p.m. will not be returned until the following business day.  For prescription refill requests, have your pharmacy contact our office and allow 72 hours.    Cancer Center Support Programs:   > Cancer Support Group  2nd Tuesday of the month 1pm-2pm, Journey Room   

## 2020-09-26 NOTE — Progress Notes (Signed)
[name], [place] [address]   Patient Care Team: Pcp, No as PCP - General  SUMMARY OF ONCOLOGIC HISTORY: Oncology History  Malignant neoplasm of overlapping sites of right breast in female, estrogen receptor positive ([place])  05/12/2017 Initial Biopsy   (R) breast (LIQ): IDC, grade 3; ER+ (80%), PR+ (10%), HER2+ (ratio 2.56). Ki67 80%.     05/12/2017 Procedure   Additional (R) breast biopsy (UOQ): IDC, grade 3; ER+ (80%), PR+ (50%), HER2+ (ratio 2.77), Ki67 90%.    05/12/2017 Procedure   (R) axillary lymph node biopsy: Metastatic carcinoma; ER+ (80%), PR+ (30%), HER2+ (ratio 2.99), Ki67 80%.    05/12/2017 Imaging   Mammogram/breast US: Targeted ultrasound is performed, showing multiple irregular hypoechoic masses. In the 6 o'clock region of the right breast is a large irregular mass spans at least 4.8 cm. In the 4:00 to 5:00 region of the right breast an irregular mass measures 4.5 cm. In the 930-10 o'clock position of the right breast centered 9-10 cm from the nipple is an irregular 4.0 x 3.8 x 1.9 cm hypoechoic mass. In the 9:30 position of the right breast at the lateral margin is a probable abnormal intramammary lymph node measuring 1.3 cm greatest diameter. In the 10 o'clock position of the right breast far lateral is a hypoechoic dermal nodule that measures 0.5 cm. Diffuse skin thickening of the right breast measures up to 4.5 mm.   05/14/2017 Tumor Marker     Ref. Range 05/14/2017 15:30  CA 15-3 Latest Ref Range: 0.0 - 25.0 U/mL 115.9 (H)     Ref. Range 05/14/2017 15:30  CA 27.29 Latest Ref Range: 0.0 - 38.6 U/mL 157.7 (H)     05/15/2017 Imaging   CT chest/abd/pelvis:  IMPRESSION: 1. Numerous right breast masses and metastatic right axillary lymphadenopathy. Findings suspicious for chest wall invasion along the lower lateral aspect of the breast. 2. Two small indeterminate right lower lobe pulmonary nodules will require surveillance. 3.  No findings for metastatic disease involving the abdomen/pelvis or osseous structures.   05/15/2017 Imaging   Bone scan:  IMPRESSION: No evidence of metastatic disease.   05/18/2017 Echocardiogram   LV EF: 60% -   65%   05/20/2017 Procedure   Port-a-cath placement    05/25/2017 Initial Diagnosis   Malignant neoplasm of overlapping sites of right breast in female, estrogen receptor positive ([place])   05/27/2017 - 09/10/2017 Neo-Adjuvant Chemotherapy   TCHPx 6 cycles   10/14/2017 Surgery   Right modified radical mastectomy and lymph node biopsy on 10/14/2017, with complete pathological response, [id number]     CHIEF COMPLIANT: Follow-up for right breast cancer   INTERVAL HISTORY: [name] is a 50 y.o. female here today for follow up of her right breast cancer. Her last visit was on 08/02/2019.   Today she is accompanied by husband reports feeling okay. She stopped taking Aromasin around September due to feeling fatigued and bone pain and would like to revisit it when she should restart it; her energy levels improved and her bone pains resolved within a few weeks of stopping Aromasin. She complains of feeling fatigued since her COVID infection. Her ankle pain has improved and she has not needed tramadol since stopping Aromasin.  She was infected with COVID a second time, around Thanksgiving 2021, though it was not severe and her symptoms were mainly fatigue, dizzy, tachycardic and weakness.   REVIEW OF SYSTEMS:   Review of Systems  Constitutional: Positive  for fatigue (25%). Negative for appetite change.  All other systems reviewed and are negative.   I have reviewed the past medical history, past surgical history, social history and family history with the patient and they are unchanged from previous note.   ALLERGIES:   is allergic to penicillins.   MEDICATIONS:  Current Outpatient Medications  Medication Sig Dispense Refill  . acetaminophen (TYLENOL) 500 MG tablet  Take 1,000 mg by mouth daily as needed for moderate pain or headache.    . exemestane (AROMASIN) 25 MG tablet TAKE 1 TABLET BY MOUTH IN THE MORNING AFTER BREAKFAST. 30 tablet 6  . traMADol (ULTRAM) 50 MG tablet TAKE (1) TABLET BY MOUTH EVERY SIX HOURS AS NEEDED. 60 tablet 4   No current facility-administered medications for this visit.     PHYSICAL EXAMINATION: Performance status (ECOG): 1 - Symptomatic but completely ambulatory  Vitals:   09/26/20 1456  BP: 128/74  Pulse: 79  Resp: 18  Temp: (!) 97.3 F (36.3 C)  SpO2: 100%   Wt Readings from Last 3 Encounters:  09/26/20 121 lb 6.4 oz (55.1 kg)  09/06/20 119 lb 3.2 oz (54.1 kg)  08/24/20 110 lb (49.9 kg)   Physical Exam Vitals reviewed.  Constitutional:      Appearance: Normal appearance.  Cardiovascular:     Rate and Rhythm: Normal rate and regular rhythm.     Pulses: Normal pulses.     Heart sounds: Normal heart sounds.  Pulmonary:     Effort: Pulmonary effort is normal.     Breath sounds: Normal breath sounds.  Chest:  Breasts:     Right: Absent. No mass, tenderness or supraclavicular adenopathy.     Left: No mass, tenderness or supraclavicular adenopathy.    Abdominal:     Palpations: Abdomen is soft. There is no mass.     Tenderness: There is no abdominal tenderness.     Hernia: No hernia is present.  Musculoskeletal:     Right lower leg: No edema.     Left lower leg: No edema.  Lymphadenopathy:     Cervical: No cervical adenopathy.     Upper Body:     Right upper body: No supraclavicular adenopathy.     Left upper body: No supraclavicular adenopathy.     Lower Body: No right inguinal adenopathy. No left inguinal adenopathy.  Neurological:     General: No focal deficit present.     Mental Status: She is alert and oriented to person, place, and time.  Psychiatric:        Mood and Affect: Mood normal.        Behavior: Behavior normal.     Breast Exam Chaperone: Angel Antis, MD     LABORATORY  DATA:  I have reviewed the data as listed CMP Latest Ref Rng & Units 09/19/2020 08/24/2020 08/12/2020  Glucose 70 - 99 mg/dL 92 117(H) 115(H)  BUN 6 - 20 mg/dL 10 <5(L) 6  Creatinine 0.44 - 1.00 mg/dL 0.68 0.65 0.79  Sodium 135 - 145 mmol/L 138 139 141  Potassium 3.5 - 5.1 mmol/L 3.8 3.5 3.9  Chloride 98 - 111 mmol/L 101 102 102  CO2 22 - 32 mmol/L '29 25 25  ' Calcium 8.9 - 10.3 mg/dL 9.7 9.6 10.2  Total Protein 6.5 - 8.1 g/dL 7.5 7.1 -  Total Bilirubin 0.3 - 1.2 mg/dL 0.6 0.7 -  Alkaline Phos 38 - 126 U/L 68 95 -  AST 15 - 41 U/L 27 18 -  ALT 0 - 44 U/L 35 16 -   Lab Results  Component Value Date   CAN153 9.7 07/27/2019   CAN153 13.0 11/03/2018   CAN153 21.6 10/01/2017   Lab Results  Component Value Date   WBC 5.4 09/19/2020   HGB 14.7 09/19/2020   HCT 44.5 09/19/2020   MCV 99.6 09/19/2020   PLT 274 09/19/2020   NEUTROABS 3.7 09/19/2020   Lab Results  Component Value Date   VD25OH 45.34 09/19/2020    ASSESSMENT:  1.  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 -Bilateral oophorectomy done on 03/16/2018. -1 year of Herceptin and Pertuzumab completed on 05/26/2018. -Exemestane 25 mg daily started on 05/26/2018. -Exemestane was self discontinued in September 2021. -Left breast mammogram on 08/27/2020 was BI-RADS Category 1.  2.  Left ankle pain: -This is from a combination of prior surgery and slight worsening after exemestane. -She is taking tramadol in the evenings.   PLAN:  1.  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 -She reportedly had infection in December which she thought was likely from [facility] as her symptoms were similar.  She  was never tested for it. -She is continuing to have some fatigue from it. -Physical examination today did not reveal any palpable masses at the right mastectomy site.  Left breast has no palpable masses. -Reviewed mammogram results from 08/27/2020 which was BI-RADS Category 1. -Reviewed labs from 09/19/2020 which showed normal LFTs.  Vitamin D was normal. -We talked about restarting exemestane.  She would like to wait until next visit.  We will see her back in 3 months with plans to reinitiate exemestane at that time.  2.  Left ankle pain: -This has improved.  She rarely uses tramadol.   Breast Cancer therapy associated bone loss: I have recommended calcium, Vitamin D and weight bearing exercises.   No orders of the defined types were placed in this encounter.  The patient has a good understanding of the overall plan. she agrees with it. she will call with any problems that may develop before the next visit here.    Angel Jack, MD [place] [phone]   I, Angel Hale, am acting as a scribe for Dr. Sanda Linger.  I, Angel Jack MD, have reviewed the above documentation for accuracy and completeness, and I agree with the above.

## 2020-09-27 ENCOUNTER — Telehealth: Payer: Self-pay | Admitting: Cardiology

## 2020-09-27 ENCOUNTER — Ambulatory Visit: Payer: Self-pay | Admitting: General Surgery

## 2020-09-27 NOTE — Telephone Encounter (Signed)
Pt aware of monitor results  ./cy 

## 2020-09-27 NOTE — Telephone Encounter (Signed)
Patient returning call for monitor results. 

## 2021-01-01 ENCOUNTER — Inpatient Hospital Stay (HOSPITAL_COMMUNITY): Payer: Self-pay

## 2021-01-04 ENCOUNTER — Inpatient Hospital Stay (HOSPITAL_COMMUNITY): Payer: Self-pay | Attending: Hematology

## 2021-01-04 ENCOUNTER — Other Ambulatory Visit: Payer: Self-pay

## 2021-01-04 DIAGNOSIS — Z170 Estrogen receptor positive status [ER+]: Secondary | ICD-10-CM | POA: Insufficient documentation

## 2021-01-04 DIAGNOSIS — C773 Secondary and unspecified malignant neoplasm of axilla and upper limb lymph nodes: Secondary | ICD-10-CM | POA: Insufficient documentation

## 2021-01-04 DIAGNOSIS — C50811 Malignant neoplasm of overlapping sites of right female breast: Secondary | ICD-10-CM | POA: Insufficient documentation

## 2021-01-04 LAB — CBC WITH DIFFERENTIAL/PLATELET
Abs Immature Granulocytes: 0.01 10*3/uL (ref 0.00–0.07)
Basophils Absolute: 0 10*3/uL (ref 0.0–0.1)
Basophils Relative: 1 %
Eosinophils Absolute: 0.1 10*3/uL (ref 0.0–0.5)
Eosinophils Relative: 1 %
HCT: 43.3 % (ref 36.0–46.0)
Hemoglobin: 14.4 g/dL (ref 12.0–15.0)
Immature Granulocytes: 0 %
Lymphocytes Relative: 24 %
Lymphs Abs: 1.3 10*3/uL (ref 0.7–4.0)
MCH: 33.2 pg (ref 26.0–34.0)
MCHC: 33.3 g/dL (ref 30.0–36.0)
MCV: 99.8 fL (ref 80.0–100.0)
Monocytes Absolute: 0.4 10*3/uL (ref 0.1–1.0)
Monocytes Relative: 7 %
Neutro Abs: 3.6 10*3/uL (ref 1.7–7.7)
Neutrophils Relative %: 67 %
Platelets: 240 10*3/uL (ref 150–400)
RBC: 4.34 MIL/uL (ref 3.87–5.11)
RDW: 12.3 % (ref 11.5–15.5)
WBC: 5.4 10*3/uL (ref 4.0–10.5)
nRBC: 0 % (ref 0.0–0.2)

## 2021-01-04 LAB — COMPREHENSIVE METABOLIC PANEL
ALT: 16 U/L (ref 0–44)
AST: 21 U/L (ref 15–41)
Albumin: 4.3 g/dL (ref 3.5–5.0)
Alkaline Phosphatase: 70 U/L (ref 38–126)
Anion gap: 8 (ref 5–15)
BUN: 12 mg/dL (ref 6–20)
CO2: 28 mmol/L (ref 22–32)
Calcium: 9.7 mg/dL (ref 8.9–10.3)
Chloride: 101 mmol/L (ref 98–111)
Creatinine, Ser: 0.69 mg/dL (ref 0.44–1.00)
GFR, Estimated: >60 mL/min (ref >60)
Glucose, Bld: 62 mg/dL — ABNORMAL LOW (ref 70–99)
Potassium: 4.1 mmol/L (ref 3.5–5.1)
Sodium: 137 mmol/L (ref 135–145)
Total Bilirubin: 0.6 mg/dL (ref 0.3–1.2)
Total Protein: 7.7 g/dL (ref 6.5–8.1)

## 2021-01-04 LAB — VITAMIN D 25 HYDROXY (VIT D DEFICIENCY, FRACTURES): Vit D, 25-Hydroxy: 49.61 ng/mL (ref 30–100)

## 2021-01-05 LAB — CANCER ANTIGEN 15-3: CA 15-3: 11.4 U/mL (ref 0.0–25.0)

## 2021-01-08 ENCOUNTER — Ambulatory Visit (HOSPITAL_COMMUNITY): Payer: Self-pay | Admitting: Hematology

## 2021-01-11 ENCOUNTER — Other Ambulatory Visit (HOSPITAL_COMMUNITY): Payer: Self-pay | Admitting: *Deleted

## 2021-01-11 ENCOUNTER — Encounter (HOSPITAL_COMMUNITY): Payer: Self-pay | Admitting: Hematology

## 2021-01-11 ENCOUNTER — Inpatient Hospital Stay (HOSPITAL_BASED_OUTPATIENT_CLINIC_OR_DEPARTMENT_OTHER): Payer: Self-pay | Admitting: Hematology

## 2021-01-11 ENCOUNTER — Other Ambulatory Visit: Payer: Self-pay

## 2021-01-11 VITALS — BP 108/79 | HR 63 | Temp 97.0°F | Resp 18 | Wt 132.6 lb

## 2021-01-11 DIAGNOSIS — C50911 Malignant neoplasm of unspecified site of right female breast: Secondary | ICD-10-CM

## 2021-01-11 DIAGNOSIS — Z170 Estrogen receptor positive status [ER+]: Secondary | ICD-10-CM

## 2021-01-11 DIAGNOSIS — C50811 Malignant neoplasm of overlapping sites of right female breast: Secondary | ICD-10-CM

## 2021-01-11 DIAGNOSIS — C773 Secondary and unspecified malignant neoplasm of axilla and upper limb lymph nodes: Secondary | ICD-10-CM

## 2021-01-11 MED ORDER — EXEMESTANE 25 MG PO TABS: ORAL_TABLET | ORAL | 6 refills | Status: AC

## 2021-01-11 NOTE — Progress Notes (Signed)
Angel Hale, [place] [address]   Patient Care Team: Pcp, No as PCP - General  SUMMARY OF ONCOLOGIC HISTORY: Oncology History  Malignant neoplasm of overlapping sites of right breast in female, estrogen receptor positive ([place])  05/12/2017 Initial Biopsy   (R) breast (LIQ): IDC, grade 3; ER+ (80%), PR+ (10%), HER2+ (ratio 2.56). Ki67 80%.     05/12/2017 Procedure   Additional (R) breast biopsy (UOQ): IDC, grade 3; ER+ (80%), PR+ (50%), HER2+ (ratio 2.77), Ki67 90%.    05/12/2017 Procedure   (R) axillary lymph node biopsy: Metastatic carcinoma; ER+ (80%), PR+ (30%), HER2+ (ratio 2.99), Ki67 80%.    05/12/2017 Imaging   Mammogram/breast US: Targeted ultrasound is performed, showing multiple irregular hypoechoic masses. In the 6 o'clock region of the right breast is a large irregular mass spans at least 4.8 cm. In the 4:00 to 5:00 region of the right breast an irregular mass measures 4.5 cm. In the 930-10 o'clock position of the right breast centered 9-10 cm from the nipple is an irregular 4.0 x 3.8 x 1.9 cm hypoechoic mass. In the 9:30 position of the right breast at the lateral margin is a probable abnormal intramammary lymph node measuring 1.3 cm greatest diameter. In the 10 o'clock position of the right breast far lateral is a hypoechoic dermal nodule that measures 0.5 cm. Diffuse skin thickening of the right breast measures up to 4.5 mm.   05/14/2017 Tumor Marker     Ref. Range 05/14/2017 15:30  CA 15-3 Latest Ref Range: 0.0 - 25.0 U/mL 115.9 (H)     Ref. Range 05/14/2017 15:30  CA 27.29 Latest Ref Range: 0.0 - 38.6 U/mL 157.7 (H)     05/15/2017 Imaging   CT chest/abd/pelvis:  IMPRESSION: 1. Numerous right breast masses and metastatic right axillary lymphadenopathy. Findings suspicious for chest wall invasion along the lower lateral aspect of the breast. 2. Two small indeterminate right lower lobe pulmonary nodules will require surveillance. 3.  No findings for metastatic disease involving the abdomen/pelvis or osseous structures.   05/15/2017 Imaging   Bone scan:  IMPRESSION: No evidence of metastatic disease.   05/18/2017 Echocardiogram   LV EF: 60% -   65%   05/20/2017 Procedure   Port-a-cath placement    05/25/2017 Initial Diagnosis   Malignant neoplasm of overlapping sites of right breast in female, estrogen receptor positive ([place])   05/27/2017 - 09/10/2017 Neo-Adjuvant Chemotherapy   TCHPx 6 cycles   10/14/2017 Surgery   Right modified radical mastectomy and lymph node biopsy on 10/14/2017, with complete pathological response, [id number]     CHIEF COMPLIANT: Follow-up for right breast cancer   INTERVAL HISTORY: [name] is a 50 y.o. female is seen for follow-up of right breast cancer.  She is accompanied by her husband.  Appetite and energy levels are 100%.  She does not report any fatigue.  Fatigue overall improved since COVID infection.   REVIEW OF SYSTEMS:   Review of Systems  All other systems reviewed and are negative.   I have reviewed the past medical history, past surgical history, social history and family history with the patient and they are unchanged from previous note.   ALLERGIES:   is allergic to penicillins.   MEDICATIONS:  Current Outpatient Medications  Medication Sig Dispense Refill  . acetaminophen (TYLENOL) 500 MG tablet Take 1,000 mg by mouth daily as needed for moderate pain or headache.    . traMADol (ULTRAM) 50 MG  tablet TAKE (1) TABLET BY MOUTH EVERY SIX HOURS AS NEEDED. 60 tablet 4  . exemestane (AROMASIN) 25 MG tablet TAKE 1 TABLET BY MOUTH IN THE MORNING AFTER BREAKFAST. 30 tablet 6   No current facility-administered medications for this visit.     PHYSICAL EXAMINATION: Performance status (ECOG): 1 - Symptomatic but completely ambulatory  Vitals:   01/11/21 1035  BP: 108/79  Pulse: 63  Resp: 18  Temp: (!) 97 F (36.1 C)  SpO2: 100%   Wt Readings from Last  3 Encounters:  01/11/21 132 lb 9.6 oz (60.1 kg)  09/26/20 121 lb 6.4 oz (55.1 kg)  09/06/20 119 lb 3.2 oz (54.1 kg)   Physical Exam Vitals reviewed.  Constitutional:      Appearance: Normal appearance.  Cardiovascular:     Rate and Rhythm: Normal rate and regular rhythm.     Pulses: Normal pulses.     Heart sounds: Normal heart sounds.  Pulmonary:     Effort: Pulmonary effort is normal.     Breath sounds: Normal breath sounds.  Chest:  Breasts:     Right: Absent. No mass, tenderness or supraclavicular adenopathy.     Left: No mass, tenderness or supraclavicular adenopathy.    Abdominal:     Palpations: Abdomen is soft. There is no mass.     Tenderness: There is no abdominal tenderness.     Hernia: No hernia is present.  Musculoskeletal:     Right lower leg: No edema.     Left lower leg: No edema.  Lymphadenopathy:     Cervical: No cervical adenopathy.     Upper Body:     Right upper body: No supraclavicular adenopathy.     Left upper body: No supraclavicular adenopathy.     Lower Body: No right inguinal adenopathy. No left inguinal adenopathy.  Neurological:     General: No focal deficit present.     Mental Status: She is alert and oriented to person, place, and time.  Psychiatric:        Mood and Affect: Mood normal.        Behavior: Behavior normal.        LABORATORY DATA:  I have reviewed the data as listed CMP Latest Ref Rng & Units 01/04/2021 09/19/2020 08/24/2020  Glucose 70 - 99 mg/dL 62(L) 92 117(H)  BUN 6 - 20 mg/dL 12 10 <5(L)  Creatinine 0.44 - 1.00 mg/dL 0.69 0.68 0.65  Sodium 135 - 145 mmol/L 137 138 139  Potassium 3.5 - 5.1 mmol/L 4.1 3.8 3.5  Chloride 98 - 111 mmol/L 101 101 102  CO2 22 - 32 mmol/L '28 29 25  ' Calcium 8.9 - 10.3 mg/dL 9.7 9.7 9.6  Total Protein 6.5 - 8.1 g/dL 7.7 7.5 7.1  Total Bilirubin 0.3 - 1.2 mg/dL 0.6 0.6 0.7  Alkaline Phos 38 - 126 U/L 70 68 95  AST 15 - 41 U/L '21 27 18  ' ALT 0 - 44 U/L 16 35 16   Lab Results   Component Value Date   CAN153 11.4 01/04/2021   CAN153 9.7 07/27/2019   CAN153 13.0 11/03/2018   Lab Results  Component Value Date   WBC 5.4 01/04/2021   HGB 14.4 01/04/2021   HCT 43.3 01/04/2021   MCV 99.8 01/04/2021   PLT 240 01/04/2021   NEUTROABS 3.6 01/04/2021   Lab Results  Component Value Date   VD25OH 49.61 01/04/2021   VD25OH 45.34 09/19/2020    ASSESSMENT:  1.  Multicentric (stage  IIIb, T4BN3A, grade 3) HER-2 positive right breast cancer: -6 cycles of TCHP from 05/27/2017 through 09/10/2017 -Status post right modified radical mastectomy on 10/14/2017 showing pCR (yPT0yPN0) - Radiation therapy completed on 01/13/2018. -Bilateral oophorectomy done on 03/16/2018. -1 year of Herceptin and Pertuzumab completed on 05/26/2018. -Exemestane 25 mg daily started on 05/26/2018. -Exemestane was self discontinued in September 2021. -Left breast mammogram on 08/27/2020 was BI-RADS Category 1.  2.  Left ankle pain: -This is from a combination of prior surgery and slight worsening after exemestane. -She is taking tramadol in the evenings.   PLAN:  1.  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 - Her tiredness has improved at this time. - Physical exam today did not reveal any palpable suspicious mass in the right chest wall.  No palpable mass in the left breast. - Reviewed labs from 01/04/2021 which showed normal LFTs.  Vitamin D is normal at 49.6.  CBC was normal. - CA 15-3 was 11.4. - Mammogram of the left breast on 08/27/2020 was BI-RADS Category 1. - I have recommended her to start back on exemestane as she took it only for 2 years. - She is willing to start back. - Will recommend follow-up in 4 months with repeat labs and physical exam. - After that I will switch her to every 6 months visits for the next 2 years.  2.  Left ankle pain: -She has on and off left  ankle pain from previous injury. - Continue tramadol as needed.   Breast Cancer therapy associated bone loss: I have recommended calcium, Vitamin D and weight bearing exercises.   Orders Placed This Encounter  Procedures  . Cancer antigen 15-3    Standing Status:   Future    Standing Expiration Date:   01/11/2022    Order Specific Question:   Release to patient    Answer:   Immediate  . Comprehensive metabolic panel    Standing Status:   Future    Standing Expiration Date:   01/11/2022    Order Specific Question:   Release to patient    Answer:   Immediate  . CBC with Differential/Platelet    Standing Status:   Future    Standing Expiration Date:   01/11/2022    Order Specific Question:   Release to patient    Answer:   Immediate  . VITAMIN D 25 Hydroxy (Vit-D Deficiency, Fractures)    Standing Status:   Future    Standing Expiration Date:   01/11/2022    Order Specific Question:   Release to patient    Answer:   Immediate   The patient has a good understanding of the overall plan. she agrees with it. she will call with any problems that may develop before the next visit here.    Derek Jack, MD [place] [phone]   I, Milinda Antis, am acting as a scribe for Dr. Sanda Linger.  I, Derek Jack MD, have reviewed the above documentation for accuracy and completeness, and I agree with the above.

## 2021-03-05 NOTE — Telephone Encounter (Signed)
error 

## 2021-05-13 NOTE — Progress Notes (Signed)
Angel Hale, Angel Hale   Patient Care Team: Pcp, No as PCP - General  SUMMARY OF ONCOLOGIC HISTORY: Oncology History  Malignant neoplasm of overlapping sites of right breast in female, estrogen receptor positive ([place])  05/12/2017 Initial Biopsy   (R) breast (LIQ): IDC, grade 3; ER+ (80%), PR+ (10%), HER2+ (ratio 2.56). Ki67 80%.     05/12/2017 Procedure   Additional (R) breast biopsy (UOQ): IDC, grade 3; ER+ (80%), PR+ (50%), HER2+ (ratio 2.77), Ki67 90%.    05/12/2017 Procedure   (R) axillary lymph node biopsy: Metastatic carcinoma; ER+ (80%), PR+ (30%), HER2+ (ratio 2.99), Ki67 80%.    05/12/2017 Imaging   Mammogram/breast US: Targeted ultrasound is performed, showing multiple irregular hypoechoic masses. In the 6 o'clock region of the right breast is a large irregular mass spans at least 4.8 cm. In the 4:00 to 5:00 region of the right breast an irregular mass measures 4.5 cm. In the 930-10 o'clock position of the right breast centered 9-10 cm from the nipple is an irregular 4.0 x 3.8 x 1.9 cm hypoechoic mass. In the 9:30 position of the right breast at the lateral margin is a probable abnormal intramammary lymph node measuring 1.3 cm greatest diameter. In the 10 o'clock position of the right breast far lateral is a hypoechoic dermal nodule that measures 0.5 cm. Diffuse skin thickening of the right breast measures up to 4.5 mm.   05/14/2017 Tumor Marker     Ref. Range 05/14/2017 15:30  CA 15-3 Latest Ref Range: 0.0 - 25.0 U/mL 115.9 (H)     Ref. Range 05/14/2017 15:30  CA 27.29 Latest Ref Range: 0.0 - 38.6 U/mL 157.7 (H)     05/15/2017 Imaging   CT chest/abd/pelvis:  IMPRESSION: 1. Numerous right breast masses and metastatic right axillary lymphadenopathy. Findings suspicious for chest wall invasion along the lower lateral aspect of the breast. 2. Two small indeterminate right lower lobe pulmonary nodules will require surveillance. 3.  No findings for metastatic disease involving the abdomen/pelvis or osseous structures.   05/15/2017 Imaging   Bone scan:  IMPRESSION: No evidence of metastatic disease.   05/18/2017 Echocardiogram   LV EF: 60% -   65%   05/20/2017 Procedure   Port-a-cath placement    05/25/2017 Initial Diagnosis   Malignant neoplasm of overlapping sites of right breast in female, estrogen receptor positive ([place])   05/27/2017 - 09/10/2017 Neo-Adjuvant Chemotherapy   TCHPx 6 cycles   10/14/2017 Surgery   Right modified radical mastectomy and lymph node biopsy on 10/14/2017, with complete pathological response, yPT0yPN0     CHIEF COMPLIANT: Follow-up for right breast cancer   INTERVAL HISTORY: Angel Hale is a 50 y.o. female here today for follow up of her right breast cancer. Her last visit was on 01/11/2021.   Today she reports feeling good. She had a Covid infection in July; she has experienced severe body aches that have only begun to resolve today. She has not resumed exemestane since stopping in September 2021. She is agreeable to restarting exemestane.  REVIEW OF SYSTEMS:   Review of Systems  Constitutional:  Positive for fatigue (50%). Negative for appetite change.  Respiratory:  Positive for shortness of breath.   All other systems reviewed and are negative.  I have reviewed the past medical history, past surgical history, social history and family history with the patient and they are unchanged from previous note.   ALLERGIES:   is allergic to  penicillins.   MEDICATIONS:  Current Outpatient Medications  Medication Sig Dispense Refill   acetaminophen (TYLENOL) 500 MG tablet Take 1,000 mg by mouth daily as needed for moderate pain or headache.     exemestane (AROMASIN) 25 MG tablet TAKE 1 TABLET BY MOUTH IN THE MORNING AFTER BREAKFAST. 30 tablet 6   LORazepam (ATIVAN) 0.5 MG tablet Take 0.5 mg by mouth daily as needed.     traMADol (ULTRAM) 50 MG tablet TAKE (1) TABLET BY  MOUTH EVERY SIX HOURS AS NEEDED. 60 tablet 4   No current facility-administered medications for this visit.     PHYSICAL EXAMINATION: Performance status (ECOG): 1 - Symptomatic but completely ambulatory  Vitals:   05/14/21 1523  BP: 121/77  Pulse: (!) 107  Resp: 18  Temp: 98 F (36.7 C)  SpO2: 100%   Wt Readings from Last 3 Encounters:  05/14/21 128 lb 4.9 oz (58.2 kg)  01/11/21 132 lb 9.6 oz (60.1 kg)  09/26/20 121 lb 6.4 oz (55.1 kg)   Physical Exam Vitals reviewed.  Constitutional:      Appearance: Normal appearance.  Cardiovascular:     Rate and Rhythm: Normal rate and regular rhythm.     Pulses: Normal pulses.     Heart sounds: Normal heart sounds.  Pulmonary:     Effort: Pulmonary effort is normal.     Breath sounds: Normal breath sounds.  Chest:  Breasts:    Right: Absent.     Left: Normal.  Abdominal:     Palpations: Abdomen is soft. There is no hepatomegaly, splenomegaly or mass.     Tenderness: There is no abdominal tenderness.  Lymphadenopathy:     Upper Body:     Right upper body: No supraclavicular, axillary or pectoral adenopathy.     Left upper body: No supraclavicular, axillary or pectoral adenopathy.  Neurological:     General: No focal deficit present.     Mental Status: She is alert and oriented to person, place, and time.  Psychiatric:        Mood and Affect: Mood normal.        Behavior: Behavior normal.    Breast Exam Chaperone: Thana Ates     LABORATORY DATA:  I have reviewed the data as listed CMP Latest Ref Rng & Units 05/14/2021 01/04/2021 09/19/2020  Glucose 70 - 99 mg/dL 94 62(L) 92  BUN 6 - 20 mg/dL _0 Creatinine 0.44 - 1.00 mg/dL 0.90 0.69 0.68  Sodium 135 - 145 mmol/L 140 137 138  Potassium 3.5 - 5.1 mmol/L 3.5 4.1 3.8  Chloride 98 - 111 mmol/L 105 101 101  CO2 22 - 32 mmol/L _1 Calcium 8.9 - 10.3 mg/dL 9.2 9.7 9.7  Total Protein 6.5 - 8.1 g/dL 7.6 7.7 7.5  Total Bilirubin 0.3 - 1.2 mg/dL 0.4 0.6 0.6   Alkaline Phos 38 - 126 U/L 61 70 68  AST 15 - 41 U/L _2 ALT 0 - 44 U/L 18 16 35   Lab Results  Component Value Date   CAN153 11.4 01/04/2021   CAN153 9.7 07/27/2019   CAN153 13.0 11/03/2018   Lab Results  Component Value Date   WBC 7.0 05/14/2021   HGB 14.9 05/14/2021   HCT 43.9 05/14/2021   MCV 97.6 05/14/2021   PLT 312 05/14/2021   NEUTROABS 5.3 05/14/2021    ASSESSMENT:  1.  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 -Bilateral oophorectomy done on 03/16/2018. -1 year of Herceptin and Pertuzumab completed on 05/26/2018. -Exemestane 25 mg daily started on 05/26/2018. -Exemestane was self discontinued in September 2021. -Left breast mammogram on 08/27/2020 was BI-RADS Category 1.   2.  Left ankle pain: -This is from a combination of prior surgery and slight worsening after exemestane. -She is taking tramadol in the evenings.   PLAN:  1.  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  We talked about restarting it at her last visit in May of this year. - She has not started it back as she had COVID infection in July. - Physical examination today did not reveal any signs of recurrence at the right mastectomy site.  Left breast has no palpable masses.  No palpable adenopathy. - Reviewed labs from today which showed normal LFTs and CBC.  Tumor marker is pending.  Last CA 15-3 was normal. - I have discussed the importance of starting back on antiestrogen therapy.  She is willing to start back on exemestane and take it for 2 years. - She will call us back in 2 days for results on tumor markers and vitamin D level. - RTC 6 months for follow-up with repeat labs.   2.  Left  ankle pain: - Left ankle pain from previous injury has been stable.  Breast Cancer therapy associated bone loss: I have recommended calcium, Vitamin D and weight bearing exercises.  Orders placed this encounter:  No orders of the defined types were placed in this encounter.   The patient has a good understanding of the overall plan. She agrees with it. She will call with any problems that may develop before the next visit here.  Derek Jack, MD [place] [phone]   I, Thana Ates, am acting as a scribe for Dr. Derek Jack.  I, Derek Jack MD, have reviewed the above documentation for accuracy and completeness, and I agree with the above.

## 2021-05-14 ENCOUNTER — Other Ambulatory Visit: Payer: Self-pay

## 2021-05-14 ENCOUNTER — Inpatient Hospital Stay (HOSPITAL_COMMUNITY): Payer: Self-pay | Attending: Hematology | Admitting: Hematology

## 2021-05-14 ENCOUNTER — Encounter (HOSPITAL_COMMUNITY): Payer: Self-pay | Admitting: Hematology

## 2021-05-14 ENCOUNTER — Inpatient Hospital Stay (HOSPITAL_COMMUNITY): Payer: Self-pay

## 2021-05-14 VITALS — BP 121/77 | HR 107 | Temp 98.0°F | Resp 18 | Wt 128.3 lb

## 2021-05-14 DIAGNOSIS — C50811 Malignant neoplasm of overlapping sites of right female breast: Secondary | ICD-10-CM

## 2021-05-14 DIAGNOSIS — Z170 Estrogen receptor positive status [ER+]: Secondary | ICD-10-CM

## 2021-05-14 DIAGNOSIS — C50911 Malignant neoplasm of unspecified site of right female breast: Secondary | ICD-10-CM

## 2021-05-14 DIAGNOSIS — M25572 Pain in left ankle and joints of left foot: Secondary | ICD-10-CM | POA: Insufficient documentation

## 2021-05-14 DIAGNOSIS — C773 Secondary and unspecified malignant neoplasm of axilla and upper limb lymph nodes: Secondary | ICD-10-CM

## 2021-05-14 LAB — COMPREHENSIVE METABOLIC PANEL
ALT: 18 U/L (ref 0–44)
AST: 18 U/L (ref 15–41)
Albumin: 4.4 g/dL (ref 3.5–5.0)
Alkaline Phosphatase: 61 U/L (ref 38–126)
Anion gap: 8 (ref 5–15)
BUN: 8 mg/dL (ref 6–20)
CO2: 27 mmol/L (ref 22–32)
Calcium: 9.2 mg/dL (ref 8.9–10.3)
Chloride: 105 mmol/L (ref 98–111)
Creatinine, Ser: 0.9 mg/dL (ref 0.44–1.00)
GFR, Estimated: >60 mL/min (ref >60)
Glucose, Bld: 94 mg/dL (ref 70–99)
Potassium: 3.5 mmol/L (ref 3.5–5.1)
Sodium: 140 mmol/L (ref 135–145)
Total Bilirubin: 0.4 mg/dL (ref 0.3–1.2)
Total Protein: 7.6 g/dL (ref 6.5–8.1)

## 2021-05-14 LAB — CBC WITH DIFFERENTIAL/PLATELET
Abs Immature Granulocytes: 0.02 10*3/uL (ref 0.00–0.07)
Basophils Absolute: 0.1 10*3/uL (ref 0.0–0.1)
Basophils Relative: 1 %
Eosinophils Absolute: 0 10*3/uL (ref 0.0–0.5)
Eosinophils Relative: 0 %
HCT: 43.9 % (ref 36.0–46.0)
Hemoglobin: 14.9 g/dL (ref 12.0–15.0)
Immature Granulocytes: 0 %
Lymphocytes Relative: 18 %
Lymphs Abs: 1.2 10*3/uL (ref 0.7–4.0)
MCH: 33.1 pg (ref 26.0–34.0)
MCHC: 33.9 g/dL (ref 30.0–36.0)
MCV: 97.6 fL (ref 80.0–100.0)
Monocytes Absolute: 0.4 10*3/uL (ref 0.1–1.0)
Monocytes Relative: 6 %
Neutro Abs: 5.3 10*3/uL (ref 1.7–7.7)
Neutrophils Relative %: 75 %
Platelets: 312 10*3/uL (ref 150–400)
RBC: 4.5 MIL/uL (ref 3.87–5.11)
RDW: 14 % (ref 11.5–15.5)
WBC: 7 10*3/uL (ref 4.0–10.5)
nRBC: 0 % (ref 0.0–0.2)

## 2021-05-14 LAB — VITAMIN D 25 HYDROXY (VIT D DEFICIENCY, FRACTURES): Vit D, 25-Hydroxy: 59.53 ng/mL (ref 30–100)

## 2021-05-14 NOTE — Patient Instructions (Addendum)
[place] at [facility] Discharge Instructions  You were seen today by Dr. Delton Coombes. He went over your recent results. You will be scheduled for a mammogram after 08/27/2021. Dr. Delton Coombes will see you back in 6 months for labs and follow up.   Thank you for choosing [place] at [facility] to provide your oncology and hematology care.  To afford each patient quality time with our provider, please arrive at least 15 minutes before your scheduled appointment time.   If you have a lab appointment with the [place] please come in thru the Main Entrance and check in at the main information desk  You need to re-schedule your appointment should you arrive 10 or more minutes late.  We strive to give you quality time with our providers, and arriving late affects you and other patients whose appointments are after yours.  Also, if you no show three or more times for appointments you may be dismissed from the clinic at the providers discretion.     Again, thank you for choosing [facility].  Our hope is that these requests will decrease the amount of time that you wait before being seen by our physicians.       _____________________________________________________________  Should you have questions after your visit to [facility], please contact our office at [phone] between the hours of 8:00 a.m. and 4:30 p.m.  Voicemails left after 4:00 p.m. will not be returned until the following business day.  For prescription refill requests, have your pharmacy contact our office and allow 72 hours.    Cancer Center Support Programs:   > Cancer Support Group  2nd Tuesday of the month 1pm-2pm, Journey Room

## 2021-05-15 LAB — CANCER ANTIGEN 15-3: CA 15-3: 12.9 U/mL (ref 0.0–25.0)

## 2021-08-29 ENCOUNTER — Ambulatory Visit (HOSPITAL_COMMUNITY): Payer: Self-pay

## 2021-09-02 ENCOUNTER — Other Ambulatory Visit: Payer: Self-pay

## 2021-09-02 ENCOUNTER — Ambulatory Visit (HOSPITAL_COMMUNITY)
Admission: RE | Admit: 2021-09-02 | Discharge: 2021-09-02 | Disposition: A | Discharge status: Home / Self Care | Payer: Self-pay | Source: Ambulatory Visit | Attending: Hematology | Admitting: Hematology

## 2021-09-02 DIAGNOSIS — C773 Secondary and unspecified malignant neoplasm of axilla and upper limb lymph nodes: Secondary | ICD-10-CM | POA: Insufficient documentation

## 2021-09-02 DIAGNOSIS — C50811 Malignant neoplasm of overlapping sites of right female breast: Secondary | ICD-10-CM | POA: Insufficient documentation

## 2021-09-02 DIAGNOSIS — C50911 Malignant neoplasm of unspecified site of right female breast: Secondary | ICD-10-CM

## 2021-09-02 DIAGNOSIS — Z1231 Encounter for screening mammogram for malignant neoplasm of breast: Secondary | ICD-10-CM | POA: Insufficient documentation

## 2021-09-02 DIAGNOSIS — Z170 Estrogen receptor positive status [ER+]: Secondary | ICD-10-CM | POA: Insufficient documentation

## 2021-11-11 ENCOUNTER — Other Ambulatory Visit: Payer: Self-pay

## 2021-11-11 ENCOUNTER — Inpatient Hospital Stay (HOSPITAL_COMMUNITY): Payer: Self-pay

## 2021-11-11 ENCOUNTER — Inpatient Hospital Stay (HOSPITAL_COMMUNITY): Payer: Self-pay | Attending: Hematology | Admitting: Hematology

## 2021-11-11 ENCOUNTER — Encounter (HOSPITAL_COMMUNITY): Payer: Self-pay | Admitting: Hematology

## 2021-11-11 VITALS — BP 118/88 | HR 81 | Temp 98.9°F | Resp 18 | Wt 123.0 lb

## 2021-11-11 DIAGNOSIS — C773 Secondary and unspecified malignant neoplasm of axilla and upper limb lymph nodes: Secondary | ICD-10-CM

## 2021-11-11 DIAGNOSIS — C50811 Malignant neoplasm of overlapping sites of right female breast: Secondary | ICD-10-CM | POA: Insufficient documentation

## 2021-11-11 DIAGNOSIS — C50911 Malignant neoplasm of unspecified site of right female breast: Secondary | ICD-10-CM

## 2021-11-11 DIAGNOSIS — Z170 Estrogen receptor positive status [ER+]: Secondary | ICD-10-CM | POA: Insufficient documentation

## 2021-11-11 DIAGNOSIS — M25572 Pain in left ankle and joints of left foot: Secondary | ICD-10-CM | POA: Insufficient documentation

## 2021-11-11 LAB — CBC WITH DIFFERENTIAL/PLATELET
Abs Immature Granulocytes: 0.01 10*3/uL (ref 0.00–0.07)
Basophils Absolute: 0 10*3/uL (ref 0.0–0.1)
Basophils Relative: 0 %
Eosinophils Absolute: 0.1 10*3/uL (ref 0.0–0.5)
Eosinophils Relative: 1 %
HCT: 41.1 % (ref 36.0–46.0)
Hemoglobin: 14.2 g/dL (ref 12.0–15.0)
Immature Granulocytes: 0 %
Lymphocytes Relative: 19 %
Lymphs Abs: 1.4 10*3/uL (ref 0.7–4.0)
MCH: 34 pg (ref 26.0–34.0)
MCHC: 34.5 g/dL (ref 30.0–36.0)
MCV: 98.3 fL (ref 80.0–100.0)
Monocytes Absolute: 0.3 10*3/uL (ref 0.1–1.0)
Monocytes Relative: 5 %
Neutro Abs: 5.5 10*3/uL (ref 1.7–7.7)
Neutrophils Relative %: 75 %
Platelets: 208 10*3/uL (ref 150–400)
RBC: 4.18 MIL/uL (ref 3.87–5.11)
RDW: 13.4 % (ref 11.5–15.5)
WBC: 7.3 10*3/uL (ref 4.0–10.5)
nRBC: 0 % (ref 0.0–0.2)

## 2021-11-11 LAB — COMPREHENSIVE METABOLIC PANEL
ALT: 25 U/L (ref 0–44)
AST: 23 U/L (ref 15–41)
Albumin: 4.3 g/dL (ref 3.5–5.0)
Alkaline Phosphatase: 73 U/L (ref 38–126)
Anion gap: 9 (ref 5–15)
BUN: 18 mg/dL (ref 6–20)
CO2: 28 mmol/L (ref 22–32)
Calcium: 9.5 mg/dL (ref 8.9–10.3)
Chloride: 102 mmol/L (ref 98–111)
Creatinine, Ser: 0.83 mg/dL (ref 0.44–1.00)
GFR, Estimated: >60 mL/min (ref >60)
Glucose, Bld: 97 mg/dL (ref 70–99)
Potassium: 3.6 mmol/L (ref 3.5–5.1)
Sodium: 139 mmol/L (ref 135–145)
Total Bilirubin: 0.4 mg/dL (ref 0.3–1.2)
Total Protein: 7.5 g/dL (ref 6.5–8.1)

## 2021-11-11 LAB — VITAMIN D 25 HYDROXY (VIT D DEFICIENCY, FRACTURES): Vit D, 25-Hydroxy: 52.89 ng/mL (ref 30–100)

## 2021-11-11 NOTE — Progress Notes (Signed)
? ?Angel Hale ?[address] ?[place], [place] [address] ? ? ?Patient Care Team: ?Pcp, No as PCP - General ? ?SUMMARY OF ONCOLOGIC HISTORY: ?Oncology History  ?Malignant neoplasm of overlapping sites of right breast in female, estrogen receptor positive ([place])  ?05/12/2017 Initial Biopsy  ? (R) breast (LIQ): IDC, grade 3; ER+ (80%), PR+ (10%), HER2+ (ratio 2.56). Ki67 80%.  ? ?  ?05/12/2017 Procedure  ? Additional (R) breast biopsy (UOQ): IDC, grade 3; ER+ (80%), PR+ (50%), HER2+ (ratio 2.77), Ki67 90%.  ?  ?05/12/2017 Procedure  ? (R) axillary lymph node biopsy: Metastatic carcinoma; ER+ (80%), PR+ (30%), HER2+ (ratio 2.99), Ki67 80%.  ?  ?05/12/2017 Imaging  ? Mammogram/breast US: Targeted ultrasound is performed, showing multiple irregular hypoechoic masses. In the 6 o'clock region of the right breast is a ?large irregular mass spans at least 4.8 cm. In the 4:00 to 5:00 region of the right breast an irregular mass measures 4.5 cm. In the 930-10 o'clock position of the right breast centered 9-10 cm from the nipple is an irregular 4.0 x 3.8 x 1.9 cm hypoechoic mass. In ?the 9:30 position of the right breast at the lateral margin is a probable abnormal intramammary lymph node measuring 1.3 cm greatest diameter. In the 10 o'clock position of the right breast far lateral is a hypoechoic dermal nodule that measures 0.5 cm. Diffuse skin thickening of the right breast measures up to 4.5 mm. ?  ?05/14/2017 Tumor Marker  ?  ? Ref. Range 05/14/2017 15:30  ?CA 15-3 Latest Ref Range: 0.0 - 25.0 U/mL 115.9 (H)  ? ? ? Ref. Range 05/14/2017 15:30  ?CA 27.29 Latest Ref Range: 0.0 - 38.6 U/mL 157.7 (H)  ? ?  ?05/15/2017 Imaging  ? CT chest/abd/pelvis:  ?IMPRESSION: ?1. Numerous right breast masses and metastatic right axillary ?lymphadenopathy. Findings suspicious for chest wall invasion along ?the lower lateral aspect of the breast. ?2. Two small indeterminate right lower lobe pulmonary nodules will ?require surveillance. ?3.  No findings for metastatic disease involving the abdomen/pelvis ?or osseous structures. ?  ?05/15/2017 Imaging  ? Bone scan:  ?IMPRESSION: ?No evidence of metastatic disease. ?  ?05/18/2017 Echocardiogram  ? LV EF: 60% -   65% ?  ?05/20/2017 Procedure  ? Port-a-cath placement  ?  ?05/25/2017 Initial Diagnosis  ? Malignant neoplasm of overlapping sites of right breast in female, estrogen receptor positive ([place]) ?  ?05/27/2017 - 09/10/2017 Neo-Adjuvant Chemotherapy  ? TCHPx 6 cycles ?  ?10/14/2017 Surgery  ? Right modified radical mastectomy and lymph node biopsy on 10/14/2017, with complete pathological response, yPT0yPN0 ?  ? ? ?CHIEF COMPLIANT: Follow-up for right breast cancer ? ? ?INTERVAL HISTORY: Angel Hale is a 51 y.o. female here today for follow up of her right breast cancer. Her last visit was on 05/14/2021.  ? ?Today she reports feeling good. She has not been taking exemestane as she reports any tablets she takes currently cause stomach pain which started following a Covid infection. She is not able to take any tablets currently. She reports her stomach pain has improved with a bland food diet. She has lost 5 lbs since her last visit. She tried to take Pepcid, and she reports she had a "terrible reaction" to it. She is taking vitamin D as a sublingual spray.   ? ?REVIEW OF SYSTEMS:   ?Review of Systems  ?Constitutional:  Negative for appetite change and fatigue.  ?Respiratory:  Positive for shortness of breath.   ?  Cardiovascular:  Positive for palpitations.  ?Neurological:  Positive for dizziness and headaches.  ?Psychiatric/Behavioral:  Positive for sleep disturbance.   ?All other systems reviewed and are negative. ? ?I have reviewed the past medical history, past surgical history, social history and family history with the patient and they are unchanged from previous note. ? ? ?ALLERGIES:   ?is allergic to penicillins and pepcid [famotidine]. ? ? ?MEDICATIONS:  ?Current Outpatient Medications   ?Medication Sig Dispense Refill  ? acetaminophen (TYLENOL) 500 MG tablet Take 1,000 mg by mouth daily as needed for moderate pain or headache.    ? exemestane (AROMASIN) 25 MG tablet TAKE 1 TABLET BY MOUTH IN THE MORNING AFTER BREAKFAST. 30 tablet 6  ? LORazepam (ATIVAN) 0.5 MG tablet Take 0.5 mg by mouth daily as needed.    ? traMADol (ULTRAM) 50 MG tablet TAKE (1) TABLET BY MOUTH EVERY SIX HOURS AS NEEDED. 60 tablet 4  ? ?No current facility-administered medications for this visit.  ? ? ? ?PHYSICAL EXAMINATION: ?Performance status (ECOG): 1 - Symptomatic but completely ambulatory ? ?Vitals:  ? 11/11/21 1515  ?BP: 118/88  ?Pulse: 81  ?Resp: 18  ?Temp: 98.9 ?F (37.2 ?C)  ?SpO2: 100%  ? ?Wt Readings from Last 3 Encounters:  ?11/11/21 123 lb 0.3 oz (55.8 kg)  ?05/14/21 128 lb 4.9 oz (58.2 kg)  ?01/11/21 132 lb 9.6 oz (60.1 kg)  ? ?Physical Exam ?Vitals reviewed.  ?Constitutional:   ?   Appearance: Normal appearance.  ?Cardiovascular:  ?   Rate and Rhythm: Normal rate and regular rhythm.  ?   Pulses: Normal pulses.  ?   Heart sounds: Normal heart sounds.  ?Pulmonary:  ?   Effort: Pulmonary effort is normal.  ?   Breath sounds: Normal breath sounds.  ?Chest:  ?Breasts: ?   Right: Absent. No swelling, bleeding, mass, skin change (mastectomy site WNL) or tenderness.  ?   Left: Normal.  ?Abdominal:  ?   Palpations: Abdomen is soft.  ?Lymphadenopathy:  ?   Upper Body:  ?   Right upper body: No supraclavicular adenopathy.  ?   Left upper body: No supraclavicular adenopathy.  ?Neurological:  ?   General: No focal deficit present.  ?   Mental Status: She is alert and oriented to person, place, and time.  ?Psychiatric:     ?   Mood and Affect: Mood normal.     ?   Behavior: Behavior normal.  ? ? ?Breast Exam Chaperone: Thana Ates   ? ? ?LABORATORY DATA:  ?I have reviewed the data as listed ?CMP Latest Ref Rng & Units 11/11/2021 05/14/2021 01/04/2021  ?Glucose 70 - 99 mg/dL 97 94 62(L)  ?BUN 6 - 20 mg/dL _0 ?Creatinine  0.44 - 1.00 mg/dL 0.83 0.90 0.69  ?Sodium 135 - 145 mmol/L 139 140 137  ?Potassium 3.5 - 5.1 mmol/L 3.6 3.5 4.1  ?Chloride 98 - 111 mmol/L 102 105 101  ?CO2 22 - 32 mmol/L _1 ?Calcium 8.9 - 10.3 mg/dL 9.5 9.2 9.7  ?Total Protein 6.5 - 8.1 g/dL 7.5 7.6 7.7  ?Total Bilirubin 0.3 - 1.2 mg/dL 0.4 0.4 0.6  ?Alkaline Phos 38 - 126 U/L 73 61 70  ?AST 15 - 41 U/L _2 ?ALT 0 - 44 U/L _3 ? ?Lab Results  ?Component Value Date  ? GBM211 12.9 05/14/2021  ? DBZ208 11.4 01/04/2021  ? YEM336 9.7 07/27/2019  ? ?Lab Results  ?  Component Value Date  ? WBC 7.3 11/11/2021  ? HGB 14.2 11/11/2021  ? HCT 41.1 11/11/2021  ? MCV 98.3 11/11/2021  ? PLT 208 11/11/2021  ? NEUTROABS 5.5 11/11/2021  ? ? ?ASSESSMENT:  ?1.  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 ?-Bilateral oophorectomy done on 03/16/2018. ?-1 year of Herceptin and Pertuzumab completed on 05/26/2018. ?-Exemestane 25 mg daily started on 05/26/2018. ?-Exemestane was self discontinued in September 2021. ?-Left breast mammogram on 08/27/2020 was BI-RADS Category 1. ?  ?2.  Left ankle pain: ?-This is from a combination of prior surgery and slight worsening after exemestane. ?-She is taking tramadol in the evenings. ? ? ?PLAN:  ?1.  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 ?- At last visit I had discussed about restarting it.  However she reportedly had "gastritis" and could not take any pills.  Taking pills causes stomach pain. ?- She tried taking famotidine but could not tolerate it.  She is eating bland diet which is helping. ?- She thinks gastritis is improving. ?- She would like to discuss the option of restarting exemestane at next visit.  She is reportedly taking  vitamin D sublingual liquid daily. ?- We reviewed her labs which show normal CMP and CBC.  Vitamin D and CA 15-3 are pending.  Last CA 15-3 was normal. ?- Mammogram of the left breast from 09/02/2021 was BI-RADS Ca

## 2021-11-11 NOTE — Patient Instructions (Signed)
[name] at [facility] ?Discharge Instructions ? ?You were seen and examined today by Dr. Delton Coombes. He reviewed your most recent labs and we are still waiting on results. Please keep follow up appointments as scheduled in 6 months. ? ? ?Thank you for choosing [place] at [facility] to provide your oncology and hematology care.  To afford each patient quality time with our provider, please arrive at least 15 minutes before your scheduled appointment time.  ? ?If you have a lab appointment with the [facility] please come in thru the Main Entrance and check in at the main information desk. ? ?You need to re-schedule your appointment should you arrive 10 or more minutes late.  We strive to give you quality time with our providers, and arriving late affects you and other patients whose appointments are after yours.  Also, if you no show three or more times for appointments you may be dismissed from the clinic at the providers discretion.     ?Again, thank you for choosing [facility].  Our hope is that these requests will decrease the amount of time that you wait before being seen by our physicians.       ?_____________________________________________________________ ? ?Should you have questions after your visit to [facility], please contact our office at [phone] and follow the prompts.  Our office hours are 8:00 a.m. and 4:30 p.m. Monday - Friday.  Please note that voicemails left after 4:00 p.m. may not be returned until the following business day.  We are closed weekends and major holidays.  You do have access to a nurse 24-7, just call the main number to the clinic [phone] and do not press any options, hold on the line and a nurse will answer the phone.   ? ?For prescription refill requests, have your pharmacy contact our office and allow 72 hours.   ? ?Due to Covid, you will need to wear a mask upon entering the hospital.  If you do not have a mask, a mask will be given to you at the Main Entrance upon arrival. For doctor visits, patients may have 1 support person age 17 or older with them. For treatment visits, patients can not have anyone with them due to social distancing guidelines and our immunocompromised population.  ? ?  ?

## 2021-11-12 LAB — CANCER ANTIGEN 15-3: CA 15-3: 12.3 U/mL (ref 0.0–25.0)

## 2022-05-26 ENCOUNTER — Inpatient Hospital Stay: Payer: Self-pay | Attending: Hematology | Admitting: Hematology

## 2022-05-26 VITALS — BP 101/68 | HR 69 | Temp 97.0°F | Resp 18 | Ht 65.0 in | Wt 134.2 lb

## 2022-05-26 DIAGNOSIS — C50811 Malignant neoplasm of overlapping sites of right female breast: Secondary | ICD-10-CM

## 2022-05-26 DIAGNOSIS — Z853 Personal history of malignant neoplasm of breast: Secondary | ICD-10-CM | POA: Insufficient documentation

## 2022-05-26 DIAGNOSIS — Z170 Estrogen receptor positive status [ER+]: Secondary | ICD-10-CM

## 2022-05-26 DIAGNOSIS — Z79811 Long term (current) use of aromatase inhibitors: Secondary | ICD-10-CM | POA: Insufficient documentation

## 2022-05-26 DIAGNOSIS — Z9011 Acquired absence of right breast and nipple: Secondary | ICD-10-CM | POA: Insufficient documentation

## 2022-05-26 DIAGNOSIS — C773 Secondary and unspecified malignant neoplasm of axilla and upper limb lymph nodes: Secondary | ICD-10-CM

## 2022-05-26 DIAGNOSIS — M25572 Pain in left ankle and joints of left foot: Secondary | ICD-10-CM | POA: Insufficient documentation

## 2022-05-26 DIAGNOSIS — C50911 Malignant neoplasm of unspecified site of right female breast: Secondary | ICD-10-CM

## 2022-05-26 NOTE — Progress Notes (Signed)
[place], [place] [address]   Patient Care Team: Pcp, No as PCP - General  SUMMARY OF ONCOLOGIC HISTORY: Oncology History  Malignant neoplasm of overlapping sites of right breast in female, estrogen receptor positive ([facility])  05/12/2017 Initial Biopsy   (R) breast (LIQ): IDC, grade 3; ER+ (80%), PR+ (10%), HER2+ (ratio 2.56). Ki67 80%.     05/12/2017 Procedure   Additional (R) breast biopsy (UOQ): IDC, grade 3; ER+ (80%), PR+ (50%), HER2+ (ratio 2.77), Ki67 90%.    05/12/2017 Procedure   (R) axillary lymph node biopsy: Metastatic carcinoma; ER+ (80%), PR+ (30%), HER2+ (ratio 2.99), Ki67 80%.    05/12/2017 Imaging   Mammogram/breast US: Targeted ultrasound is performed, showing multiple irregular hypoechoic masses. In the 6 o'clock region of the right breast is a large irregular mass spans at least 4.8 cm. In the 4:00 to 5:00 region of the right breast an irregular mass measures 4.5 cm. In the 930-10 o'clock position of the right breast centered 9-10 cm from the nipple is an irregular 4.0 x 3.8 x 1.9 cm hypoechoic mass. In the 9:30 position of the right breast at the lateral margin is a probable abnormal intramammary lymph node measuring 1.3 cm greatest diameter. In the 10 o'clock position of the right breast far lateral is a hypoechoic dermal nodule that measures 0.5 cm. Diffuse skin thickening of the right breast measures up to 4.5 mm.   05/14/2017 Tumor Marker     Ref. Range 05/14/2017 15:30  CA 15-3 Latest Ref Range: 0.0 - 25.0 U/mL 115.9 (H)     Ref. Range 05/14/2017 15:30  CA 27.29 Latest Ref Range: 0.0 - 38.6 U/mL 157.7 (H)     05/15/2017 Imaging   CT chest/abd/pelvis:  IMPRESSION: 1. Numerous right breast masses and metastatic right axillary lymphadenopathy. Findings suspicious for chest wall invasion along the lower lateral aspect of the breast. 2. Two small indeterminate right lower lobe pulmonary nodules will require surveillance. 3.  No findings for metastatic disease involving the abdomen/pelvis or osseous structures.   05/15/2017 Imaging   Bone scan:  IMPRESSION: No evidence of metastatic disease.   05/18/2017 Echocardiogram   LV EF: 60% -   65%   05/20/2017 Procedure   Port-a-cath placement    05/25/2017 Initial Diagnosis   Malignant neoplasm of overlapping sites of right breast in female, estrogen receptor positive ([facility])   05/27/2017 - 09/10/2017 Neo-Adjuvant Chemotherapy   TCHPx 6 cycles   10/14/2017 Surgery   Right modified radical mastectomy and lymph node biopsy on 10/14/2017, with complete pathological response, [id number]     CHIEF COMPLIANT: Follow-up for right breast cancer   INTERVAL HISTORY: Angel Hale is a 51 y.o. female seen for follow-up of her right breast cancer.  She is not taking exemestane.  She reportedly had COVID again 1 and half month ago which resulted in fatigue and headaches.  They have resolved at this time.  She is taking vitamin D sublingual 2 sprays/day.  She is taking calcium occasionally.  Denies any new onset pains.  REVIEW OF SYSTEMS:   Review of Systems  Neurological:  Positive for numbness (Hands and feet.).  All other systems reviewed and are negative.   I have reviewed the past medical history, past surgical history, social history and family history with the patient and they are unchanged from previous note.   ALLERGIES:   is allergic to penicillins and pepcid [famotidine].   MEDICATIONS:  Current Outpatient  Medications  Medication Sig Dispense Refill   acetaminophen (TYLENOL) 500 MG tablet Take 1,000 mg by mouth daily as needed for moderate pain or headache.     exemestane (AROMASIN) 25 MG tablet TAKE 1 TABLET BY MOUTH IN THE MORNING AFTER BREAKFAST. 30 tablet 6   LORazepam (ATIVAN) 0.5 MG tablet Take 0.5 mg by mouth daily as needed.     traMADol (ULTRAM) 50 MG tablet TAKE (1) TABLET BY MOUTH EVERY SIX HOURS AS NEEDED. 60 tablet 4   No current  facility-administered medications for this visit.     PHYSICAL EXAMINATION: Performance status (ECOG): 1 - Symptomatic but completely ambulatory  Vitals:   05/26/22 1532  BP: 101/68  Pulse: 69  Resp: 18  Temp: (!) 97 F (36.1 C)  SpO2: 99%   Wt Readings from Last 3 Encounters:  05/26/22 134 lb 3.2 oz (60.9 kg)  11/11/21 123 lb 0.3 oz (55.8 kg)  05/14/21 128 lb 4.9 oz (58.2 kg)   Physical Exam Vitals reviewed.  Constitutional:      Appearance: Normal appearance.  Cardiovascular:     Rate and Rhythm: Normal rate and regular rhythm.     Pulses: Normal pulses.     Heart sounds: Normal heart sounds.  Pulmonary:     Effort: Pulmonary effort is normal.     Breath sounds: Normal breath sounds.  Chest:  Breasts:    Right: Absent. No swelling, bleeding, mass, skin change (mastectomy site WNL) or tenderness.     Left: Normal.  Abdominal:     Palpations: Abdomen is soft.  Lymphadenopathy:     Upper Body:     Right upper body: No supraclavicular adenopathy.     Left upper body: No supraclavicular adenopathy.  Neurological:     General: No focal deficit present.     Mental Status: She is alert and oriented to person, place, and time.  Psychiatric:        Mood and Affect: Mood normal.        Behavior: Behavior normal.     Breast Exam Chaperone: Thana Ates     LABORATORY DATA:  I have reviewed the data as listed    Latest Ref Rng & Units 11/11/2021    2:40 PM 05/14/2021    2:40 PM 01/04/2021   11:58 AM  CMP  Glucose 70 - 99 mg/dL 97  94  62   BUN 6 - 20 mg/dL '18  8  12   ' Creatinine 0.44 - 1.00 mg/dL 0.83  0.90  0.69   Sodium 135 - 145 mmol/L 139  140  137   Potassium 3.5 - 5.1 mmol/L 3.6  3.5  4.1   Chloride 98 - 111 mmol/L 102  105  101   CO2 22 - 32 mmol/L '28  27  28   ' Calcium 8.9 - 10.3 mg/dL 9.5  9.2  9.7   Total Protein 6.5 - 8.1 g/dL 7.5  7.6  7.7   Total Bilirubin 0.3 - 1.2 mg/dL 0.4  0.4  0.6   Alkaline Phos 38 - 126 U/L 73  61  70   AST 15 - 41 U/L '23   18  21   ' ALT 0 - 44 U/L '25  18  16    ' Lab Results  Component Value Date   CAN153 12.3 11/11/2021   CAN153 12.9 05/14/2021   CAN153 11.4 01/04/2021   Lab Results  Component Value Date   WBC 7.3 11/11/2021   HGB 14.2 11/11/2021  HCT 41.1 11/11/2021   MCV 98.3 11/11/2021   PLT 208 11/11/2021   NEUTROABS 5.5 11/11/2021    ASSESSMENT:  1.  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 -Bilateral oophorectomy done on 03/16/2018. -1 year of Herceptin and Pertuzumab completed on 05/26/2018. -Exemestane 25 mg daily started on 05/26/2018. -Exemestane was self discontinued in September 2021. -Left breast mammogram on 08/27/2020 was BI-RADS Category 1.   2.  Left ankle pain: -This is from a combination of prior surgery and slight worsening after exemestane. -She is taking tramadol in the evenings.   PLAN:  1.  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 - Physical examination today did not reveal any palpable masses at the right mastectomy site.  Left breast has no palpable masses.  No palpable adenopathy in the axillary or supraclavicular region. - I have reviewed lab work from [facility] from September 2023.  LFTs, CBC, CA 15-3 and vitamin D levels were normal.  Creatinine was normal. - She is not interested in restarting antiestrogen therapy. - Recommend continuing calcium and vitamin D supplements. - Recommend follow-up in 6 months.  After October 2024, we will change the follow-up visits to once a year.    Breast Cancer therapy associated bone loss: I have recommended calcium, Vitamin D and weight bearing exercises.  Orders placed this encounter:  Orders Placed This Encounter   Procedures   MM 3D SCREEN BREAST UNI LEFT   CBC with Differential/Platelet   Comprehensive metabolic panel   VITAMIN D 25 Hydroxy (Vit-D Deficiency, Fractures)   Cancer antigen 15-3     The patient has a good understanding of the overall plan. She agrees with it. She will call with any problems that may develop before the next visit here.  Derek Jack, MD [facility] [phone]

## 2022-05-26 NOTE — Patient Instructions (Addendum)
[facility]  Discharge Instructions  You were seen and examined today by Dr. Delton Coombes.  Dr. Delton Coombes discussed your most recent lab work which revealed that everything looks good.  Continue taking Aromasin as prescribed.  Follow-up as scheduled in 6 months with labs and Mammogram in January 2024.    Thank you for choosing [facility] to provide your oncology and hematology care.   To afford each patient quality time with our provider, please arrive at least 15 minutes before your scheduled appointment time. You may need to reschedule your appointment if you arrive late (10 or more minutes). Arriving late affects you and other patients whose appointments are after yours.  Also, if you miss three or more appointments without notifying the office, you may be dismissed from the clinic at the provider's discretion.    Again, thank you for choosing [facility].  Our hope is that these requests will decrease the amount of time that you wait before being seen by our physicians.   If you have a lab appointment with the [facility] please come in thru the Main Entrance and check in at the main information desk.           _____________________________________________________________  Should you have questions after your visit to [facility], please contact our office at [phone] and follow the prompts.  Our office hours are 8:00 a.m. to 4:30 p.m. Monday - Thursday and 8:00 a.m. to 2:30 p.m. Friday.  Please note that voicemails left after 4:00 p.m. may not be returned until the following business day.  We are closed weekends and all major holidays.  You do have access to a nurse 24-7, just call the main number to the clinic [phone] and do not press any options, hold on the line and a nurse will answer the phone.    For prescription refill requests, have your pharmacy contact our office and allow 72 hours.     Masks are optional in the cancer centers. If you would like for your care team to wear a mask while they are taking care of you, please let them know. You may have one support person who is at least 50 years old accompany you for your appointments.

## 2022-09-05 ENCOUNTER — Ambulatory Visit (HOSPITAL_COMMUNITY): Payer: Self-pay

## 2022-11-18 ENCOUNTER — Inpatient Hospital Stay: Payer: Self-pay

## 2022-11-25 ENCOUNTER — Ambulatory Visit: Payer: Self-pay | Admitting: Hematology

## 2023-05-03 ENCOUNTER — Emergency Department (HOSPITAL_COMMUNITY)
Admission: EM | Admit: 2023-05-03 | Discharge: 2023-05-03 | Disposition: A | Discharge status: Home / Self Care | Payer: Self-pay | Attending: Emergency Medicine | Admitting: Emergency Medicine

## 2023-05-03 ENCOUNTER — Emergency Department (HOSPITAL_COMMUNITY): Payer: Self-pay

## 2023-05-03 ENCOUNTER — Encounter (HOSPITAL_COMMUNITY): Payer: Self-pay

## 2023-05-03 ENCOUNTER — Other Ambulatory Visit: Payer: Self-pay

## 2023-05-03 DIAGNOSIS — R1032 Left lower quadrant pain: Secondary | ICD-10-CM | POA: Insufficient documentation

## 2023-05-03 DIAGNOSIS — Z853 Personal history of malignant neoplasm of breast: Secondary | ICD-10-CM | POA: Insufficient documentation

## 2023-05-03 LAB — URINALYSIS, ROUTINE W REFLEX MICROSCOPIC
Bilirubin Urine: NEGATIVE
Glucose, UA: NEGATIVE mg/dL
Hgb urine dipstick: NEGATIVE
Ketones, ur: 5 mg/dL — AB
Leukocytes,Ua: NEGATIVE
Nitrite: NEGATIVE
Protein, ur: NEGATIVE mg/dL
Specific Gravity, Urine: 1.003 — ABNORMAL LOW (ref 1.005–1.030)
pH: 5 (ref 5.0–8.0)

## 2023-05-03 LAB — COMPREHENSIVE METABOLIC PANEL
ALT: 13 U/L (ref 0–44)
AST: 18 U/L (ref 15–41)
Albumin: 4.6 g/dL (ref 3.5–5.0)
Alkaline Phosphatase: 48 U/L (ref 38–126)
Anion gap: 12 (ref 5–15)
BUN: 6 mg/dL (ref 6–20)
CO2: 24 mmol/L (ref 22–32)
Calcium: 9.4 mg/dL (ref 8.9–10.3)
Chloride: 101 mmol/L (ref 98–111)
Creatinine, Ser: 0.8 mg/dL (ref 0.44–1.00)
GFR, Estimated: >60 mL/min (ref >60)
Glucose, Bld: 87 mg/dL (ref 70–99)
Potassium: 3.9 mmol/L (ref 3.5–5.1)
Sodium: 137 mmol/L (ref 135–145)
Total Bilirubin: 0.7 mg/dL (ref 0.3–1.2)
Total Protein: 7.4 g/dL (ref 6.5–8.1)

## 2023-05-03 LAB — CBC WITH DIFFERENTIAL/PLATELET
Abs Immature Granulocytes: 0.02 10*3/uL (ref 0.00–0.07)
Basophils Absolute: 0 10*3/uL (ref 0.0–0.1)
Basophils Relative: 0 %
Eosinophils Absolute: 0 10*3/uL (ref 0.0–0.5)
Eosinophils Relative: 0 %
HCT: 45 % (ref 36.0–46.0)
Hemoglobin: 15.3 g/dL — ABNORMAL HIGH (ref 12.0–15.0)
Immature Granulocytes: 0 %
Lymphocytes Relative: 21 %
Lymphs Abs: 1.6 10*3/uL (ref 0.7–4.0)
MCH: 32.1 pg (ref 26.0–34.0)
MCHC: 34 g/dL (ref 30.0–36.0)
MCV: 94.5 fL (ref 80.0–100.0)
Monocytes Absolute: 0.4 10*3/uL (ref 0.1–1.0)
Monocytes Relative: 5 %
Neutro Abs: 5.2 10*3/uL (ref 1.7–7.7)
Neutrophils Relative %: 74 %
Platelets: 260 10*3/uL (ref 150–400)
RBC: 4.76 MIL/uL (ref 3.87–5.11)
RDW: 14 % (ref 11.5–15.5)
WBC: 7.2 10*3/uL (ref 4.0–10.5)
nRBC: 0 % (ref 0.0–0.2)

## 2023-05-03 LAB — LIPASE, BLOOD: Lipase: 40 U/L (ref 11–51)

## 2023-05-03 MED ORDER — IOHEXOL 350 MG/ML SOLN
75.0000 mL | Freq: Once | INTRAVENOUS | Status: AC | PRN
  Administered 2023-05-03: 75 mL via INTRAVENOUS

## 2023-05-03 MED ORDER — DICYCLOMINE HCL 20 MG PO TABS: 20.0000 mg | ORAL_TABLET | Freq: Two times a day (BID) | ORAL | 0 refills | Status: AC

## 2023-05-03 NOTE — ED Provider Triage Note (Signed)
Emergency Medicine Provider Triage Evaluation Note  Angel Hale , a 52 y.o. female  was evaluated in triage.  Pt complains of abdominal pain for the last 5 weeks.  Pain is in the left lower quadrant, intermittent, nonradiating.  She went to an urgent care and was given Flagyl for diverticulitis.  Patient reports minimal improvement after finishing her antibiotics.  She she reports feeling cold/chills.  Denies fever.  She denies any bowel changes, urinary symptoms, vaginal bleeding, abnormal vaginal discharge..  Review of Systems  Positive: As above Negative: As above  Physical Exam  BP 115/65 (BP Location: Left Arm)   Pulse 64   Temp (!) 97.4 F (36.3 C) (Oral)   Resp 18   SpO2 100%  Gen:   Awake, no distress   Resp:  Normal effort  MSK:   Moves extremities without difficulty  Other:  TTP to LLQ.  Medical Decision Making  Medically screening exam initiated at 2:02 PM.  Appropriate orders placed.  Angel Hale was informed that the remainder of the evaluation will be completed by another provider, this initial triage assessment does not replace that evaluation, and the importance of remaining in the ED until their evaluation is complete.     Jeanelle Malling, [place] 05/03/23 1404

## 2023-05-03 NOTE — Discharge Instructions (Addendum)
Your laboratory and CT imaging was unremarkable.  That does not discount the fact that you have been having the symptoms for the last 5 weeks.  Your symptoms could be consistent with IBS however a more serious cause such as colonic malignancy cannot be excluded.  Recommend you urgently follow-up with a gastroenterologist outpatient for consideration for outpatient colonoscopy.  Bentyl for abdominal cramping.

## 2023-05-03 NOTE — ED Provider Notes (Signed)
[place] EMERGENCY DEPARTMENT AT [facility] Provider Note   CSN: [id number] Arrival date & time: 05/03/23  1311     History  Chief Complaint  Patient presents with   Abdominal Pain    Angel Hale is a 52 y.o. female.   Abdominal Pain    52 year old female medical history significant for breast cancer status post radiation therapy, chemotherapy and laparoscopic bilateral salpingo-oophorectomy presenting to the ED with 5 weeks of abdominal pain.  The patient states that it started with sharp pain in the LLQ, no fever or chills. PCP diagnosed diverticulitis, prescribed Flagyl, no other ABX. Developed worsening pain across the lower abdomen over the next week. Endorsed fatigue. Had an episode of nausea and vomiting with initial onset of symptoms. Was inconsistently taking the Flagyl, finished a 10d course. Pain continues to fluctuate, worsens depending on what she eats. Ok with chicken broth. Abd pain flares with greasy food. Persistent pain now for 5 weeks. Both ovaries have been removed. Hx of breast cancer and ovaries were removed 6 years ago laparoscopically. In menopause. No vaginal bleeding or discharge. Was prescribed Ivermectin for weeks for covid infection and developed symptoms of gastritis. Has had a 5-10 lb weight loss over the past few weeks.  No melena or hematochezia.  She initially had nausea and vomiting, she states that recently she has had none of those symptoms and is tolerating oral intake.   Home Medications Prior to Admission medications   Medication Sig Start Date End Date Taking? Authorizing Provider  dicyclomine (BENTYL) 20 MG tablet Take 1 tablet (20 mg total) by mouth 2 (two) times daily. 05/03/23  Yes Ernie Avena, MD  acetaminophen (TYLENOL) 500 MG tablet Take 1,000 mg by mouth daily as needed for moderate pain or headache.    [provider]  exemestane (AROMASIN) 25 MG tablet TAKE 1 TABLET BY MOUTH IN THE MORNING AFTER BREAKFAST.  01/11/21   Doreatha Massed, MD  LORazepam (ATIVAN) 0.5 MG tablet Take 0.5 mg by mouth daily as needed. 05/10/21   [provider]  traMADol (ULTRAM) 50 MG tablet TAKE (1) TABLET BY MOUTH EVERY SIX HOURS AS NEEDED. 10/24/19   Doreatha Massed, MD      Allergies    Penicillins and Pepcid [famotidine]    Review of Systems   Review of Systems  Gastrointestinal:  Positive for abdominal pain.  All other systems reviewed and are negative.   Physical Exam Updated Vital Signs BP 115/65 (BP Location: Left Arm)   Pulse 64   Temp (!) 97.4 F (36.3 C) (Oral)   Resp 18   SpO2 100%  Physical Exam Vitals and nursing note reviewed.  Constitutional:      General: She is not in acute distress.    Appearance: She is well-developed.  HENT:     Head: Normocephalic and atraumatic.  Eyes:     Conjunctiva/sclera: Conjunctivae normal.  Cardiovascular:     Rate and Rhythm: Normal rate and regular rhythm.     Heart sounds: No murmur heard. Pulmonary:     Effort: Pulmonary effort is normal. No respiratory distress.     Breath sounds: Normal breath sounds.  Abdominal:     Palpations: Abdomen is soft.     Tenderness: There is abdominal tenderness in the left lower quadrant. There is no guarding or rebound.     Comments: Abdomen flat, nontender to palpation, no abdominal wall masses.  Musculoskeletal:        General: No  swelling.     Cervical back: Neck supple.  Skin:    General: Skin is warm and dry.     Capillary Refill: Capillary refill takes less than 2 seconds.  Neurological:     Mental Status: She is alert.  Psychiatric:        Mood and Affect: Mood normal.     ED Results / Procedures / Treatments   Labs (all labs ordered are listed, but only abnormal results are displayed) Labs Reviewed  CBC WITH DIFFERENTIAL/PLATELET - Abnormal; Notable for the following components:      Result Value   Hemoglobin 15.3 (*)    All other components within normal limits  URINALYSIS,  ROUTINE W REFLEX MICROSCOPIC - Abnormal; Notable for the following components:   Color, Urine STRAW (*)    Specific Gravity, Urine 1.003 (*)    Ketones, ur 5 (*)    All other components within normal limits  COMPREHENSIVE METABOLIC PANEL  LIPASE, BLOOD    EKG None  Radiology CT ABDOMEN PELVIS W CONTRAST  Result Date: 05/03/2023 CLINICAL DATA:  Left lower quadrant pain. EXAM: CT ABDOMEN AND PELVIS WITH CONTRAST TECHNIQUE: Multidetector CT imaging of the abdomen and pelvis was performed using the standard protocol following bolus administration of intravenous contrast. RADIATION DOSE REDUCTION: This exam was performed according to the departmental dose-optimization program which includes automated exposure control, adjustment of the mA and/or kV according to patient size and/or use of iterative reconstruction technique. CONTRAST:  75mL OMNIPAQUE IOHEXOL 350 MG/ML SOLN COMPARISON:  None Available. FINDINGS: Lower Chest: No acute findings. Hepatobiliary: No suspicious hepatic masses identified. Gallbladder is unremarkable. No evidence of biliary ductal dilatation. Pancreas:  No mass or inflammatory changes. Spleen: Within normal limits in size and appearance. Adrenals/Urinary Tract: No suspicious masses identified. No evidence of ureteral calculi or hydronephrosis. Stomach/Bowel: No evidence of obstruction, inflammatory process or abnormal fluid collections. Vascular/Lymphatic: No pathologically enlarged lymph nodes. No acute vascular findings. Reproductive:  No mass or other significant abnormality. Other:  None. Musculoskeletal:  No suspicious bone lesions identified. IMPRESSION: Negative. No radiographic evidence of diverticulitis or other significant abnormality. Electronically Signed   By: Danae Orleans M.D.   On: 05/03/2023 15:46    Procedures Procedures    Medications Ordered in ED Medications  iohexol (OMNIPAQUE) 350 MG/ML injection 75 mL (75 mLs Intravenous Contrast Given 05/03/23 1517)     ED Course/ Medical Decision Making/ A&P                                 Medical Decision Making   52 year old female medical history significant for breast cancer status post radiation therapy, chemotherapy and laparoscopic bilateral salpingo-oophorectomy presenting to the ED with 5 weeks of abdominal pain.  The patient states that it started with sharp pain in the LLQ, no fever or chills. PCP diagnosed diverticulitis, prescribed Flagyl, no other ABX. Developed worsening pain across the lower abdomen over the next week. Endorsed fatigue. Had an episode of nausea and vomiting with initial onset of symptoms. Was inconsistently taking the Flagyl, finished a 10d course. Pain continues to fluctuate, worsens depending on what she eats. Ok with chicken broth. Abd pain flares with greasy food. Persistent pain now for 5 weeks. Both ovaries have been removed. Hx of breast cancer and ovaries were removed 6 years ago laparoscopically. In menopause. No vaginal bleeding or discharge. Was prescribed Ivermectin for weeks for covid infection and developed  symptoms of gastritis. Has had a 5-10 lb weight loss over the past few weeks.  No melena or hematochezia.  She initially had nausea and vomiting, she states that recently she has had none of those symptoms and is tolerating oral intake.  On arrival, the patient was afebrile, temperature 97.4, not tachycardic or tachypneic, RR 18, BP 115/65, saturating 100% on room air.  Sinus rhythm noted on cardiac telemetry. Physical exam with an abdomen that was soft, mild tenderness in the LLQ, no rebound or guarding, no masses present.  Initial Assessment:   With the patient's presentation of abdominal pain, most likely diagnosis is diverticulitis, malignancy, IBS. Other diagnoses were considered including (but not limited to) gastroenteritis, colitis, small bowel obstruction, appendicitis, cholecystitis, pancreatitis, nephrolithiasis, UTI, pyelonephritis.  These are  considered less likely due to history of present illness and physical exam findings.      Initial Plan:  CBC/CMP to evaluate for underlying infectious/metabolic etiology for patient's abdominal pain  Lipase to evaluate for pancreatitis  CTAB/Pelvis with contrast to evaluate for structural/surgical etiology of patients' severe abdominal pain.  Urinalysis and repeat physical assessment to evaluate for UTI/Pyelonpehritis  Empiric management of symptoms with escalating pain control and antiemetics as needed.   Initial Study Results:   Laboratory  All laboratory results reviewed without evidence of clinically relevant pathology.   Exceptions include: Evidence of mild hemoconcentration with a hemoglobin of 15.3, otherwise lipase normal, CBC normal, CMP unremarkable, urinalysis without hematuria or evidence of UTI.    Radiology All images reviewed independently. Agree with radiology report at this time.   CT ABDOMEN PELVIS W CONTRAST  Result Date: 05/03/2023 CLINICAL DATA:  Left lower quadrant pain. EXAM: CT ABDOMEN AND PELVIS WITH CONTRAST TECHNIQUE: Multidetector CT imaging of the abdomen and pelvis was performed using the standard protocol following bolus administration of intravenous contrast. RADIATION DOSE REDUCTION: This exam was performed according to the departmental dose-optimization program which includes automated exposure control, adjustment of the mA and/or kV according to patient size and/or use of iterative reconstruction technique. CONTRAST:  75mL OMNIPAQUE IOHEXOL 350 MG/ML SOLN COMPARISON:  None Available. FINDINGS: Lower Chest: No acute findings. Hepatobiliary: No suspicious hepatic masses identified. Gallbladder is unremarkable. No evidence of biliary ductal dilatation. Pancreas:  No mass or inflammatory changes. Spleen: Within normal limits in size and appearance. Adrenals/Urinary Tract: No suspicious masses identified. No evidence of ureteral calculi or hydronephrosis.  Stomach/Bowel: No evidence of obstruction, inflammatory process or abnormal fluid collections. Vascular/Lymphatic: No pathologically enlarged lymph nodes. No acute vascular findings. Reproductive:  No mass or other significant abnormality. Other:  None. Musculoskeletal:  No suspicious bone lesions identified. IMPRESSION: Negative. No radiographic evidence of diverticulitis or other significant abnormality. Electronically Signed   By: Danae Orleans M.D.   On: 05/03/2023 15:46     Final Reassessment and Plan:   Overall reassuring workup with no evidence of acute emergency requiring inpatient hospitalization and further immediate evaluation.  Patient without abnormality on CT imaging, no evidence of surgical emergency, no evidence of diverticulitis.  Laboratory evaluation is overall reassuring.  Patient tenderness in the left lower quadrant with weight loss and changes to bowel habits raises concern for potential GI malignancy.  I recommended the patient schedule close follow-up with a gastroenterologist in clinic for consideration for outpatient colonoscopy.  A message was sent to Dr. Meridee Score to assist with outpatient scheduling.   The patient has been appropriately medically screened and/or stabilized in the ED. I have low suspicion for any other emergent  medical condition which would require further screening, evaluation or treatment in the ED or require inpatient management. Will trial a course of Bentyl for abdominal cramping and spasm, close follow-up recommended.  Stable for discharge.  Final Clinical Impression(s) / ED Diagnoses Final diagnoses:  Left lower quadrant abdominal pain    Rx / DC Orders ED Discharge Orders          Ordered    Ambulatory referral to Gastroenterology        05/03/23 1650    dicyclomine (BENTYL) 20 MG tablet  2 times daily        05/03/23 1651              Ernie Avena, MD 05/03/23 1712

## 2023-05-03 NOTE — ED Triage Notes (Signed)
Pt reports LLQ pain for the past 5 weeks, pt was initially seen at her PCP and was given abx for diverticulitis. Pt finished a 10 day course of flagyl but states she is still having intense pain after she eats. Pt initially had some vomiting and diarrhea but now her bowels are back to normal.

## 2023-05-26 ENCOUNTER — Ambulatory Visit: Payer: Self-pay | Admitting: Surgery

## 2023-07-16 ENCOUNTER — Ambulatory Visit: Payer: Self-pay | Admitting: Gastroenterology
